# Patient Record
Sex: Male | Born: 1950 | Race: White | Hispanic: No | Marital: Married | Smoking: Former smoker
Health system: Southern US, Community
[De-identification: ages and names within clinical notes are randomized; demographics above are authoritative.]

## PROBLEM LIST (undated history)

## (undated) DIAGNOSIS — J449 Chronic obstructive pulmonary disease, unspecified: Secondary | ICD-10-CM

## (undated) DIAGNOSIS — H9190 Unspecified hearing loss, unspecified ear: Secondary | ICD-10-CM

## (undated) DIAGNOSIS — I509 Heart failure, unspecified: Secondary | ICD-10-CM

## (undated) DIAGNOSIS — R29898 Other symptoms and signs involving the musculoskeletal system: Secondary | ICD-10-CM

## (undated) DIAGNOSIS — R2681 Unsteadiness on feet: Secondary | ICD-10-CM

## (undated) DIAGNOSIS — F0390 Unspecified dementia without behavioral disturbance: Secondary | ICD-10-CM

## (undated) DIAGNOSIS — Z87442 Personal history of urinary calculi: Secondary | ICD-10-CM

## (undated) DIAGNOSIS — E512 Wernicke's encephalopathy: Secondary | ICD-10-CM

## (undated) DIAGNOSIS — I10 Essential (primary) hypertension: Secondary | ICD-10-CM

## (undated) DIAGNOSIS — E119 Type 2 diabetes mellitus without complications: Secondary | ICD-10-CM

## (undated) HISTORY — PX: NOSE SURGERY: SHX723

## (undated) HISTORY — PX: INNER EAR SURGERY: SHX679

## (undated) HISTORY — DX: Type 2 diabetes mellitus without complications: E11.9

## (undated) HISTORY — PX: BACK SURGERY: SHX140

## (undated) HISTORY — DX: Wernicke's encephalopathy: E51.2

## (undated) HISTORY — DX: Heart failure, unspecified: I50.9

## (undated) HISTORY — DX: Unspecified dementia, unspecified severity, without behavioral disturbance, psychotic disturbance, mood disturbance, and anxiety: F03.90

---

## 2004-11-25 ENCOUNTER — Ambulatory Visit (HOSPITAL_COMMUNITY): Admission: RE | Admit: 2004-11-25 | Discharge: 2004-11-25 | Payer: Self-pay | Admitting: Family Medicine

## 2007-06-30 ENCOUNTER — Ambulatory Visit (HOSPITAL_COMMUNITY): Admission: RE | Admit: 2007-06-30 | Discharge: 2007-06-30 | Payer: Self-pay | Admitting: Family Medicine

## 2011-08-13 ENCOUNTER — Emergency Department (HOSPITAL_COMMUNITY)
Admission: EM | Admit: 2011-08-13 | Discharge: 2011-08-13 | Disposition: A | Discharge status: Home / Self Care | Payer: 59 | Attending: Emergency Medicine | Admitting: Emergency Medicine

## 2011-08-13 ENCOUNTER — Encounter: Payer: Self-pay | Admitting: *Deleted

## 2011-08-13 DIAGNOSIS — S0100XA Unspecified open wound of scalp, initial encounter: Secondary | ICD-10-CM | POA: Insufficient documentation

## 2011-08-13 DIAGNOSIS — Z23 Encounter for immunization: Secondary | ICD-10-CM | POA: Insufficient documentation

## 2011-08-13 DIAGNOSIS — IMO0002 Reserved for concepts with insufficient information to code with codable children: Secondary | ICD-10-CM | POA: Insufficient documentation

## 2011-08-13 DIAGNOSIS — Y92009 Unspecified place in unspecified non-institutional (private) residence as the place of occurrence of the external cause: Secondary | ICD-10-CM | POA: Insufficient documentation

## 2011-08-13 DIAGNOSIS — S0990XA Unspecified injury of head, initial encounter: Secondary | ICD-10-CM | POA: Insufficient documentation

## 2011-08-13 DIAGNOSIS — S0101XA Laceration without foreign body of scalp, initial encounter: Secondary | ICD-10-CM

## 2011-08-13 DIAGNOSIS — R51 Headache: Secondary | ICD-10-CM | POA: Insufficient documentation

## 2011-08-13 MED ORDER — NAPROXEN 500 MG PO TABS: 500.0000 mg | ORAL_TABLET | Freq: Two times a day (BID) | ORAL | Status: AC

## 2011-08-13 MED ORDER — TETANUS-DIPHTH-ACELL PERTUSSIS 5-2.5-18.5 LF-MCG/0.5 IM SUSP
INTRAMUSCULAR | Status: AC
  Administered 2011-08-13: 0.5 mL via INTRAMUSCULAR
  Filled 2011-08-13: qty 0.5

## 2011-08-13 MED ORDER — BACITRACIN ZINC 500 UNIT/GM EX OINT
TOPICAL_OINTMENT | CUTANEOUS | Status: AC
  Filled 2011-08-13: qty 0.9

## 2011-08-13 MED ORDER — BACITRACIN 500 UNIT/GM EX OINT
1.0000 "application " | TOPICAL_OINTMENT | Freq: Two times a day (BID) | CUTANEOUS | Status: DC
  Administered 2011-08-13: 1 via TOPICAL
  Filled 2011-08-13 (×3): qty 0.9

## 2011-08-13 NOTE — ED Notes (Signed)
Lac cleaned w/ normal saline & sterile gauze. edp cleaned w/ betadine prior to stapling. Dressing applied to head.

## 2011-08-13 NOTE — ED Notes (Signed)
2" lac to rt posterior scalp.  Cleansed, staples  By Dr Hyacinth Meeker.  Released in care of wife.  Pt is heard of hearing.

## 2011-08-13 NOTE — ED Notes (Signed)
Scalp lac, struck head on wt bench , had fallen asleep  While using computer

## 2011-08-13 NOTE — ED Provider Notes (Signed)
History     CSN: [id number] Arrival date & time: 08/13/2011  2:10 AM   First MD Initiated Contact with Patient 08/13/11 [phone]      Chief Complaint  Patient presents with  . Laceration    (Consider location/radiation/quality/duration/timing/severity/associated sxs/prior treatment) HPI Comments: Acute onset of head injury after he fell asleep at the computer, fell backwards striking his posterior right head on a weight bench. This was associated with a laceration to the head, moderate bleeding which was controlled with pressure. No associated loss of consciousness, seizure, vomiting, visual change, weakness, numbness, ataxia. Symptoms are constant Symptoms are mild Worse with palpation  Patient is a 60 y.o. male presenting with scalp laceration. The history is provided by the patient and the spouse.  Head Laceration This is a new problem. The current episode started less than 1 hour ago. The problem has not changed since onset.Associated symptoms include headaches ( initially had headache, but has resolved). Pertinent negatives include no chest pain, no abdominal pain and no shortness of breath. Exacerbated by: Palpation. Relieved by: Rest.    History reviewed. No pertinent past medical history.  Past Surgical History  Procedure Date  . Back surgery   . Nose surgery     No family history on file.  History  Substance Use Topics  . Smoking status: Current Everyday Smoker  . Smokeless tobacco: Not on file  . Alcohol Use: Yes      Review of Systems  Constitutional: Negative for fever.  Respiratory: Negative for shortness of breath.   Cardiovascular: Negative for chest pain.  Gastrointestinal: Negative for vomiting and abdominal pain.  Skin: Positive for wound.       Laceration  Neurological: Positive for headaches ( initially had headache, but has resolved). Negative for weakness and numbness.    Allergies  Review of patient's allergies indicates no known  allergies.  Home Medications   Current Outpatient Rx  Name Route Sig Dispense Refill  . NAPROXEN 500 MG PO TABS Oral Take 1 tablet (500 mg total) by mouth 2 (two) times daily with a meal. 30 tablet 0    BP 162/100  SpO2 97%  Physical Exam  Nursing note and vitals reviewed. Constitutional: He appears well-developed and well-nourished. No distress.  HENT:  Head: Normocephalic.       5.5 cm laceration to the posterior right occipital scalp.  Minimal surrounding tenderness and no depression of the skull palpated  no facial tenderness, deformity, malocclusion or hemotympanum.  no battle's sign or racoon eyes.   Eyes: Conjunctivae are normal. Right eye exhibits no discharge. Left eye exhibits no discharge. No scleral icterus.  Neck: Normal range of motion. Neck supple.  Cardiovascular: Normal rate and regular rhythm.   No murmur heard. Pulmonary/Chest: Effort normal and breath sounds normal.  Musculoskeletal: He exhibits no edema and no tenderness.       No spinal tenderness to palpation  Skin: Skin is warm and dry. He is not diaphoretic.       Laceration is linear, bleeding controlled with pressure, no foreign body visualized    ED Course  LACERATION REPAIR Date/Time: 08/13/2011 2:10 AM Performed by: Eber Hong D Authorized by: Eber Hong D Consent: Verbal consent obtained. Risks and benefits: risks, benefits and alternatives were discussed Consent given by: patient Patient understanding: patient states understanding of the procedure being performed Patient consent: the patient's understanding of the procedure matches consent given Procedure consent: procedure consent matches procedure scheduled Relevant documents: relevant documents present and verified  Test results: test results available and properly labeled Site marked: the operative site was marked Imaging studies: imaging studies available Patient identity confirmed: verbally with patient and arm band Time out:  Immediately prior to procedure a "time out" was called to verify the correct patient, procedure, equipment, support staff and site/side marked as required. Location: Posterior scalp. Laceration length: 5.5 cm Foreign bodies: no foreign bodies Tendon involvement: none Nerve involvement: none Vascular damage: no Anesthesia method: None. Patient sedated: no Preparation: Patient was prepped and draped in the usual sterile fashion. Irrigation solution: saline Irrigation method: syringe Debridement: none Degree of undermining: none Skin closure: staples Number of sutures: 6 Suture technique: Staples. Approximation: close Approximation difficulty: simple Dressing: pressure dressing and antibiotic ointment Patient tolerance: Patient tolerated the procedure well with no immediate complications.   (including critical care time)  Labs Reviewed - No data to display No results found.   1. Laceration of scalp   2. Minor head injury       MDM  Head injury, no signs of focal neurologic deficit, no anticoagulative therapy, bleeding controlled after staples placed, wound cleansed with saline and Betadine prior to closure, bacitracin and pressure dressing after closure. Patient declines pain medications, given indications for followup and return for staple removal. Understanding expressed by patient and spouse        Vida Roller, MD 08/13/11 [phone]

## 2015-10-24 ENCOUNTER — Emergency Department (HOSPITAL_COMMUNITY): Payer: BLUE CROSS/BLUE SHIELD

## 2015-10-24 ENCOUNTER — Encounter (HOSPITAL_COMMUNITY): Payer: Self-pay | Admitting: Emergency Medicine

## 2015-10-24 ENCOUNTER — Inpatient Hospital Stay (HOSPITAL_COMMUNITY)
Admission: EM | Admit: 2015-10-24 | Discharge: 2015-11-11 | DRG: 190 | Disposition: A | Discharge status: Skilled Nursing Facility | Payer: BLUE CROSS/BLUE SHIELD | Attending: Internal Medicine | Admitting: Internal Medicine

## 2015-10-24 DIAGNOSIS — J9601 Acute respiratory failure with hypoxia: Secondary | ICD-10-CM | POA: Diagnosis present

## 2015-10-24 DIAGNOSIS — R0902 Hypoxemia: Secondary | ICD-10-CM

## 2015-10-24 DIAGNOSIS — I5033 Acute on chronic diastolic (congestive) heart failure: Secondary | ICD-10-CM | POA: Diagnosis not present

## 2015-10-24 DIAGNOSIS — E669 Obesity, unspecified: Secondary | ICD-10-CM | POA: Diagnosis present

## 2015-10-24 DIAGNOSIS — J441 Chronic obstructive pulmonary disease with (acute) exacerbation: Secondary | ICD-10-CM

## 2015-10-24 DIAGNOSIS — J440 Chronic obstructive pulmonary disease with acute lower respiratory infection: Secondary | ICD-10-CM | POA: Diagnosis not present

## 2015-10-24 DIAGNOSIS — Z6829 Body mass index (BMI) 29.0-29.9, adult: Secondary | ICD-10-CM

## 2015-10-24 DIAGNOSIS — I959 Hypotension, unspecified: Secondary | ICD-10-CM | POA: Diagnosis present

## 2015-10-24 DIAGNOSIS — I110 Hypertensive heart disease with heart failure: Secondary | ICD-10-CM | POA: Diagnosis present

## 2015-10-24 DIAGNOSIS — R451 Restlessness and agitation: Secondary | ICD-10-CM | POA: Insufficient documentation

## 2015-10-24 DIAGNOSIS — F10239 Alcohol dependence with withdrawal, unspecified: Secondary | ICD-10-CM | POA: Diagnosis present

## 2015-10-24 DIAGNOSIS — I160 Hypertensive urgency: Secondary | ICD-10-CM | POA: Diagnosis present

## 2015-10-24 DIAGNOSIS — R1310 Dysphagia, unspecified: Secondary | ICD-10-CM | POA: Diagnosis present

## 2015-10-24 DIAGNOSIS — R0602 Shortness of breath: Secondary | ICD-10-CM | POA: Diagnosis present

## 2015-10-24 DIAGNOSIS — T17908A Unspecified foreign body in respiratory tract, part unspecified causing other injury, initial encounter: Secondary | ICD-10-CM

## 2015-10-24 DIAGNOSIS — I5032 Chronic diastolic (congestive) heart failure: Secondary | ICD-10-CM | POA: Diagnosis not present

## 2015-10-24 DIAGNOSIS — T17998D Other foreign object in respiratory tract, part unspecified causing other injury, subsequent encounter: Secondary | ICD-10-CM | POA: Diagnosis not present

## 2015-10-24 DIAGNOSIS — F10939 Alcohol use, unspecified with withdrawal, unspecified: Secondary | ICD-10-CM

## 2015-10-24 DIAGNOSIS — G9340 Encephalopathy, unspecified: Secondary | ICD-10-CM | POA: Diagnosis present

## 2015-10-24 DIAGNOSIS — F17200 Nicotine dependence, unspecified, uncomplicated: Secondary | ICD-10-CM | POA: Diagnosis present

## 2015-10-24 DIAGNOSIS — J449 Chronic obstructive pulmonary disease, unspecified: Secondary | ICD-10-CM

## 2015-10-24 DIAGNOSIS — J069 Acute upper respiratory infection, unspecified: Secondary | ICD-10-CM | POA: Diagnosis not present

## 2015-10-24 DIAGNOSIS — J9801 Acute bronchospasm: Secondary | ICD-10-CM | POA: Diagnosis not present

## 2015-10-24 DIAGNOSIS — J988 Other specified respiratory disorders: Secondary | ICD-10-CM

## 2015-10-24 DIAGNOSIS — J690 Pneumonitis due to inhalation of food and vomit: Secondary | ICD-10-CM | POA: Diagnosis not present

## 2015-10-24 DIAGNOSIS — I10 Essential (primary) hypertension: Secondary | ICD-10-CM

## 2015-10-24 DIAGNOSIS — I509 Heart failure, unspecified: Secondary | ICD-10-CM

## 2015-10-24 DIAGNOSIS — K219 Gastro-esophageal reflux disease without esophagitis: Secondary | ICD-10-CM | POA: Diagnosis present

## 2015-10-24 DIAGNOSIS — F10231 Alcohol dependence with withdrawal delirium: Secondary | ICD-10-CM | POA: Diagnosis not present

## 2015-10-24 DIAGNOSIS — F10232 Alcohol dependence with withdrawal with perceptual disturbance: Secondary | ICD-10-CM | POA: Diagnosis not present

## 2015-10-24 DIAGNOSIS — R079 Chest pain, unspecified: Secondary | ICD-10-CM | POA: Diagnosis not present

## 2015-10-24 HISTORY — DX: Essential (primary) hypertension: I10

## 2015-10-24 HISTORY — DX: Chronic obstructive pulmonary disease, unspecified: J44.9

## 2015-10-24 LAB — CBC
HCT: 51.7 % (ref 39.0–52.0)
Hemoglobin: 17.5 g/dL — ABNORMAL HIGH (ref 13.0–17.0)
MCH: 34.8 pg — ABNORMAL HIGH (ref 26.0–34.0)
MCHC: 33.8 g/dL (ref 30.0–36.0)
MCV: 102.8 fL — ABNORMAL HIGH (ref 78.0–100.0)
Platelets: 133 10*3/uL — ABNORMAL LOW (ref 150–400)
RBC: 5.03 MIL/uL (ref 4.22–5.81)
RDW: 13 % (ref 11.5–15.5)
WBC: 8.4 10*3/uL (ref 4.0–10.5)

## 2015-10-24 LAB — BASIC METABOLIC PANEL
ANION GAP: 10 (ref 5–15)
BUN: 10 mg/dL (ref 6–20)
CALCIUM: 9.6 mg/dL (ref 8.9–10.3)
CO2: 31 mmol/L (ref 22–32)
Chloride: 97 mmol/L — ABNORMAL LOW (ref 101–111)
Creatinine, Ser: 1.07 mg/dL (ref 0.61–1.24)
GFR calc Af Amer: >60 mL/min (ref >60)
GLUCOSE: 134 mg/dL — AB (ref 65–99)
POTASSIUM: 4.2 mmol/L (ref 3.5–5.1)
SODIUM: 138 mmol/L (ref 135–145)

## 2015-10-24 LAB — URINALYSIS, ROUTINE W REFLEX MICROSCOPIC
Bilirubin Urine: NEGATIVE
Glucose, UA: NEGATIVE mg/dL
Hgb urine dipstick: NEGATIVE
Ketones, ur: NEGATIVE mg/dL
Leukocytes, UA: NEGATIVE
Nitrite: NEGATIVE
Protein, ur: 30 mg/dL — AB
Specific Gravity, Urine: 1.015 (ref 1.005–1.030)
pH: 6.5 (ref 5.0–8.0)

## 2015-10-24 LAB — MRSA PCR SCREENING: MRSA BY PCR: NEGATIVE

## 2015-10-24 LAB — URINE MICROSCOPIC-ADD ON
Bacteria, UA: NONE SEEN
Squamous Epithelial / LPF: NONE SEEN
WBC, UA: NONE SEEN WBC/hpf (ref 0–5)

## 2015-10-24 LAB — TSH: TSH: 0.954 u[IU]/mL (ref 0.350–4.500)

## 2015-10-24 LAB — BRAIN NATRIURETIC PEPTIDE: B NATRIURETIC PEPTIDE 5: 61 pg/mL (ref 0.0–100.0)

## 2015-10-24 LAB — TROPONIN I: TROPONIN I: 0.03 ng/mL (ref <0.031)

## 2015-10-24 MED ORDER — ALBUTEROL SULFATE (2.5 MG/3ML) 0.083% IN NEBU: 2.5000 mg | INHALATION_SOLUTION | Freq: Four times a day (QID) | RESPIRATORY_TRACT | Status: DC

## 2015-10-24 MED ORDER — ACETAMINOPHEN 650 MG RE SUPP
650.0000 mg | Freq: Four times a day (QID) | RECTAL | Status: DC | PRN
  Filled 2015-10-24: qty 1

## 2015-10-24 MED ORDER — ENOXAPARIN SODIUM 40 MG/0.4ML ~~LOC~~ SOLN
40.0000 mg | SUBCUTANEOUS | Status: DC
  Administered 2015-10-24 – 2015-10-31 (×8): 40 mg via SUBCUTANEOUS
  Filled 2015-10-24 (×7): qty 0.4

## 2015-10-24 MED ORDER — ALBUTEROL (5 MG/ML) CONTINUOUS INHALATION SOLN
10.0000 mg/h | INHALATION_SOLUTION | RESPIRATORY_TRACT | Status: DC
  Administered 2015-10-24: 10 mg/h via RESPIRATORY_TRACT
  Filled 2015-10-24: qty 20

## 2015-10-24 MED ORDER — IPRATROPIUM-ALBUTEROL 0.5-2.5 (3) MG/3ML IN SOLN
3.0000 mL | Freq: Four times a day (QID) | RESPIRATORY_TRACT | Status: DC
  Administered 2015-10-24 – 2015-10-25 (×2): 3 mL via RESPIRATORY_TRACT
  Filled 2015-10-24 (×2): qty 3

## 2015-10-24 MED ORDER — SODIUM CHLORIDE 0.9% FLUSH: 3.0000 mL | INTRAVENOUS | Status: DC | PRN

## 2015-10-24 MED ORDER — IPRATROPIUM-ALBUTEROL 0.5-2.5 (3) MG/3ML IN SOLN
3.0000 mL | Freq: Four times a day (QID) | RESPIRATORY_TRACT | Status: DC
  Administered 2015-10-24: 3 mL via RESPIRATORY_TRACT
  Filled 2015-10-24 (×2): qty 3

## 2015-10-24 MED ORDER — ALBUTEROL SULFATE (2.5 MG/3ML) 0.083% IN NEBU: 2.5000 mg | INHALATION_SOLUTION | RESPIRATORY_TRACT | Status: DC | PRN

## 2015-10-24 MED ORDER — LORAZEPAM 1 MG PO TABS: 1.0000 mg | ORAL_TABLET | Freq: Four times a day (QID) | ORAL | Status: AC | PRN

## 2015-10-24 MED ORDER — LORAZEPAM 2 MG/ML IJ SOLN
0.0000 mg | Freq: Two times a day (BID) | INTRAMUSCULAR | Status: DC
  Filled 2015-10-24: qty 2

## 2015-10-24 MED ORDER — SODIUM CHLORIDE 0.9% FLUSH
3.0000 mL | Freq: Two times a day (BID) | INTRAVENOUS | Status: DC
  Administered 2015-10-24 – 2015-11-09 (×21): 3 mL via INTRAVENOUS

## 2015-10-24 MED ORDER — LORAZEPAM 2 MG/ML IJ SOLN
0.0000 mg | Freq: Four times a day (QID) | INTRAMUSCULAR | Status: DC
  Administered 2015-10-24 – 2015-10-25 (×3): 2 mg via INTRAVENOUS
  Administered 2015-10-25 – 2015-10-26 (×3): 4 mg via INTRAVENOUS
  Filled 2015-10-24: qty 2
  Filled 2015-10-24 (×2): qty 1
  Filled 2015-10-24: qty 2
  Filled 2015-10-24: qty 1
  Filled 2015-10-24 (×2): qty 2

## 2015-10-24 MED ORDER — FOLIC ACID 1 MG PO TABS
1.0000 mg | ORAL_TABLET | Freq: Every day | ORAL | Status: DC
  Administered 2015-10-24 – 2015-10-29 (×4): 1 mg via ORAL
  Filled 2015-10-24 (×4): qty 1

## 2015-10-24 MED ORDER — IPRATROPIUM BROMIDE 0.02 % IN SOLN: 0.5000 mg | Freq: Four times a day (QID) | RESPIRATORY_TRACT | Status: DC

## 2015-10-24 MED ORDER — ALBUTEROL SULFATE (2.5 MG/3ML) 0.083% IN NEBU
2.5000 mg | INHALATION_SOLUTION | RESPIRATORY_TRACT | Status: DC
  Administered 2015-10-24: 2.5 mg via RESPIRATORY_TRACT
  Filled 2015-10-24: qty 3

## 2015-10-24 MED ORDER — DEXTROSE 5 % IV SOLN
0.5000 mg/min | INTRAVENOUS | Status: DC
  Administered 2015-10-24: 0.5 mg/min via INTRAVENOUS
  Filled 2015-10-24: qty 100

## 2015-10-24 MED ORDER — POTASSIUM CHLORIDE CRYS ER 20 MEQ PO TBCR
20.0000 meq | EXTENDED_RELEASE_TABLET | Freq: Once | ORAL | Status: AC
  Administered 2015-10-24: 20 meq via ORAL
  Filled 2015-10-24: qty 1

## 2015-10-24 MED ORDER — METHYLPREDNISOLONE SODIUM SUCC 125 MG IJ SOLR
80.0000 mg | Freq: Once | INTRAMUSCULAR | Status: AC
  Administered 2015-10-24: 80 mg via INTRAVENOUS
  Filled 2015-10-24: qty 2

## 2015-10-24 MED ORDER — FUROSEMIDE 10 MG/ML IJ SOLN
60.0000 mg | Freq: Once | INTRAMUSCULAR | Status: AC
  Administered 2015-10-24: 60 mg via INTRAVENOUS
  Filled 2015-10-24: qty 6

## 2015-10-24 MED ORDER — VITAMIN B-1 100 MG PO TABS
100.0000 mg | ORAL_TABLET | Freq: Every day | ORAL | Status: DC
  Administered 2015-10-24 – 2015-11-11 (×9): 100 mg via ORAL
  Filled 2015-10-24 (×13): qty 1

## 2015-10-24 MED ORDER — ONDANSETRON HCL 4 MG PO TABS: 4.0000 mg | ORAL_TABLET | Freq: Four times a day (QID) | ORAL | Status: DC | PRN

## 2015-10-24 MED ORDER — GUAIFENESIN 100 MG/5ML PO SOLN
5.0000 mL | ORAL | Status: DC | PRN
  Administered 2015-10-24 – 2015-10-26 (×3): 100 mg via ORAL
  Filled 2015-10-24 (×3): qty 5

## 2015-10-24 MED ORDER — SENNOSIDES-DOCUSATE SODIUM 8.6-50 MG PO TABS: 1.0000 | ORAL_TABLET | Freq: Every evening | ORAL | Status: DC | PRN

## 2015-10-24 MED ORDER — ACETAMINOPHEN 325 MG PO TABS
650.0000 mg | ORAL_TABLET | Freq: Four times a day (QID) | ORAL | Status: DC | PRN
  Administered 2015-11-03: 650 mg via ORAL

## 2015-10-24 MED ORDER — ONDANSETRON HCL 4 MG/2ML IJ SOLN
4.0000 mg | Freq: Three times a day (TID) | INTRAMUSCULAR | Status: DC | PRN
Start: 2015-10-24 — End: 2015-10-24

## 2015-10-24 MED ORDER — ONDANSETRON HCL 4 MG/2ML IJ SOLN: 4.0000 mg | Freq: Four times a day (QID) | INTRAMUSCULAR | Status: DC | PRN

## 2015-10-24 MED ORDER — LORAZEPAM 2 MG/ML IJ SOLN
1.0000 mg | Freq: Four times a day (QID) | INTRAMUSCULAR | Status: AC | PRN
  Administered 2015-10-26 – 2015-10-27 (×2): 1 mg via INTRAVENOUS
  Filled 2015-10-24 (×3): qty 1

## 2015-10-24 MED ORDER — LEVOFLOXACIN IN D5W 750 MG/150ML IV SOLN
750.0000 mg | INTRAVENOUS | Status: DC
  Administered 2015-10-24 – 2015-10-28 (×5): 750 mg via INTRAVENOUS
  Filled 2015-10-24 (×5): qty 150

## 2015-10-24 MED ORDER — DM-GUAIFENESIN ER 30-600 MG PO TB12
1.0000 | ORAL_TABLET | Freq: Two times a day (BID) | ORAL | Status: DC
  Administered 2015-10-24 – 2015-11-11 (×19): 1 via ORAL
  Filled 2015-10-24 (×20): qty 1

## 2015-10-24 MED ORDER — THIAMINE HCL 100 MG/ML IJ SOLN
100.0000 mg | Freq: Every day | INTRAMUSCULAR | Status: DC
  Administered 2015-10-27 – 2015-11-08 (×6): 100 mg via INTRAVENOUS
  Filled 2015-10-24 (×7): qty 2

## 2015-10-24 MED ORDER — ADULT MULTIVITAMIN W/MINERALS CH
1.0000 | ORAL_TABLET | Freq: Every day | ORAL | Status: DC
  Administered 2015-10-24 – 2015-11-11 (×10): 1 via ORAL
  Filled 2015-10-24 (×13): qty 1

## 2015-10-24 MED ORDER — SODIUM CHLORIDE 0.9 % IV SOLN
250.0000 mL | INTRAVENOUS | Status: DC | PRN
  Administered 2015-10-27: 250 mL via INTRAVENOUS

## 2015-10-24 MED ORDER — METHYLPREDNISOLONE SODIUM SUCC 125 MG IJ SOLR
60.0000 mg | Freq: Two times a day (BID) | INTRAMUSCULAR | Status: DC
  Administered 2015-10-24 – 2015-10-25 (×2): 60 mg via INTRAVENOUS
  Filled 2015-10-24 (×2): qty 2

## 2015-10-24 NOTE — ED Notes (Signed)
Patient complaining of shortness of breath "for months but worsening recently." States patient was seen by Urgent Care yesterday and told he "has some congestive heart failure and I need to go to the hospital." Spouse states patient did not come yesterday due to "no insurance."

## 2015-10-24 NOTE — ED Notes (Signed)
Report given to Clarise Cruz, RN for room ICU-06.

## 2015-10-24 NOTE — H&P (Addendum)
[facility]          History and Physical    PCP:   No PCP Per Patient   EDP: Virgel Manifold, M.D.  Chief Complaint:  Shortness of breath, lower extremity edema  HPI: Patient is a 65 year old man who has not had any medical care in the past 20 years who presents to the hospital today with a 3 month history of worsening shortness of breath or lower extremity edema. He visited an urgent care yesterday and was told he probably had pneumonia and advised him to come to the emergency department today. Denies any fevers or chills, states that the shortness of breath is worse with exertion, he smokes a pack and half a day and has done so for over 20 years, states he drinks about 6 beers a day, for the past 7 days he has had a runny nose and congestion as well as a nonproductive cough. In the emergency department he was found to be markedly hypertensive with an initial blood pressure of 205/134. Labs were essentially unremarkable with the exception of slight hemoconcentration with a hemoglobin of 17.5 chest x-ray showed mild chronic kidney changes but no acute findings. Initial troponin was normal at 0.03 and EKG was normal with the exception of sinus tachycardia with a rate of 111. Avonex for further evaluation and management.  Allergies:  No Known Allergies    Past Medical History  Diagnosis Date  . COPD (chronic obstructive pulmonary disease) ([place])   . Hypertension     Past Surgical History  Procedure Laterality Date  . Back surgery    . Nose surgery    . Inner ear surgery      Prior to Admission medications   Medication Sig Start Date End Date Taking? Authorizing Provider  acetaminophen (TYLENOL) 500 MG tablet Take 500 mg by mouth every 6 (six) hours as needed for mild pain or moderate pain.   Yes Historical Provider, MD  azithromycin (ZITHROMAX) 250 MG tablet Take 250-500 mg by mouth See admin instructions. Take two tablets by mouth on day 1 then take one  tablet on days 2 through 5 starting on 10/23/2015   Yes Historical Provider, MD  dextromethorphan-guaiFENesin (MUCINEX DM) 30-600 MG 12hr tablet Take 1 tablet by mouth 2 (two) times daily as needed for cough.   Yes Historical Provider, MD  guaiFENesin-dextromethorphan (ROBITUSSIN DM) 100-10 MG/5ML syrup Take 10 mLs by mouth every 4 (four) hours as needed for cough.   Yes Historical Provider, MD  oxymetazoline (AFRIN) 0.05 % nasal spray Place 2 sprays into both nostrils 2 (two) times daily as needed for congestion.   Yes Historical Provider, MD  Phenyleph-CPM-DM-Aspirin (ALKA-SELTZER PLUS COLD & COUGH) 7.04-14-09-325 MG TBEF Take 1 tablet by mouth daily as needed (for cold and congestion).   Yes Historical Provider, MD  predniSONE (DELTASONE) 20 MG tablet Take 20 mg by mouth See admin instructions. Starting on 10/23/15 take 3 tablets daily for 4 days, then take 1 tablet daily for 3 days   Yes Historical Provider, MD    Social History:  reports that he has been smoking.  He does not have any smokeless tobacco history on file. He reports that he drinks alcohol. He reports that he does not use illicit drugs.  History reviewed. No pertinent family history.  Review of Systems:  Constitutional: Denies fever, chills, diaphoresis, appetite change and fatigue.  HEENT: Denies photophobia, eye pain,  redness, hearing loss, ear pain, congestion, sore throat, rhinorrhea, sneezing, mouth sores, trouble swallowing, neck pain, neck stiffness and tinnitus.   Respiratory: Denies SOB, DOE, cough, chest tightness,  and wheezing.   Cardiovascular: Denies chest pain, palpitations and leg swelling.  Gastrointestinal: Denies nausea, vomiting, abdominal pain, diarrhea, constipation, blood in stool and abdominal distention.  Genitourinary: Denies dysuria, urgency, frequency, hematuria, flank pain and difficulty urinating.  Endocrine: Denies: hot or cold intolerance, sweats, changes in hair or nails, polyuria,  polydipsia. Musculoskeletal: Denies myalgias, back pain, joint swelling, arthralgias and gait problem.  Skin: Denies pallor, rash and wound.  Neurological: Denies dizziness, seizures, syncope, weakness, light-headedness, numbness and headaches.  Hematological: Denies adenopathy. Easy bruising, personal or family bleeding history  Psychiatric/Behavioral: Denies suicidal ideation, mood changes, confusion, nervousness, sleep disturbance and agitation   Physical Exam: Blood pressure 155/92, pulse 97, temperature 98 F (36.7 C), temperature source Oral, resp. rate 14, height 5' 9"  (1.753 m), weight 97.9 kg (215 lb 13.3 oz), SpO2 94 %. General: Alert, awake, oriented 3 HEENT: Normocephalic, atraumatic, pupils equal and reactive to light, extraocular movements intact, wears corrective lenses + neck: Supple, no JVD, lymphadenopathy, no bruits, no goiter. Cardiovascular: Regular rate and rhythm, no murmurs, rubs or gallops a slightly  Lungs: Diffuse bilateral expiratory wheezes, no rhonchi or crackles Abdomen: Obese, soft, nontender, nondistended, positive bowel sounds Extremities: 2+ pitting edema bilaterally, positive pulses Neurologic: Grossly intact and nonfocal  Labs on Admission:  Results for orders placed or performed during the hospital encounter of 10/24/15 (from the past 48 hour(s))  Basic metabolic panel     Status: Abnormal   Collection Time: 10/24/15  9:07 AM  Result Value Ref Range   Sodium 138 135 - 145 mmol/L   Potassium 4.2 3.5 - 5.1 mmol/L   Chloride 97 (L) 101 - 111 mmol/L   CO2 31 22 - 32 mmol/L   Glucose, Bld 134 (H) 65 - 99 mg/dL   BUN 10 6 - 20 mg/dL   Creatinine, Ser 1.07 0.61 - 1.24 mg/dL   Calcium 9.6 8.9 - 10.3 mg/dL   GFR calc non Af Amer >60 >60 mL/min   GFR calc Af Amer >60 >60 mL/min    Comment: (NOTE) The eGFR has been calculated using the CKD EPI equation. This calculation has not been validated in all clinical situations. eGFR's persistently <60 mL/min  signify possible Chronic Kidney Disease.    Anion gap 10 5 - 15  CBC     Status: Abnormal   Collection Time: 10/24/15  9:07 AM  Result Value Ref Range   WBC 8.4 4.0 - 10.5 K/uL   RBC 5.03 4.22 - 5.81 MIL/uL   Hemoglobin 17.5 (H) 13.0 - 17.0 g/dL   HCT 51.7 39.0 - 52.0 %   MCV 102.8 (H) 78.0 - 100.0 fL   MCH 34.8 (H) 26.0 - 34.0 pg   MCHC 33.8 30.0 - 36.0 g/dL   RDW 13.0 11.5 - 15.5 %   Platelets 133 (L) 150 - 400 K/uL  Brain natriuretic peptide     Status: None   Collection Time: 10/24/15  9:07 AM  Result Value Ref Range   B Natriuretic Peptide 61.0 0.0 - 100.0 pg/mL  Troponin I     Status: None   Collection Time: 10/24/15  9:07 AM  Result Value Ref Range   Troponin I 0.03 <0.031 ng/mL    Comment:        NO INDICATION OF MYOCARDIAL INJURY.   TSH  Status: None   Collection Time: 10/24/15  9:07 AM  Result Value Ref Range   TSH 0.954 0.350 - 4.500 uIU/mL  Urinalysis, Routine w reflex microscopic (not at [facility])     Status: Abnormal   Collection Time: 10/24/15 11:34 AM  Result Value Ref Range   Color, Urine YELLOW YELLOW   APPearance CLEAR CLEAR   Specific Gravity, Urine 1.015 1.005 - 1.030   pH 6.5 5.0 - 8.0   Glucose, UA NEGATIVE NEGATIVE mg/dL   Hgb urine dipstick NEGATIVE NEGATIVE   Bilirubin Urine NEGATIVE NEGATIVE   Ketones, ur NEGATIVE NEGATIVE mg/dL   Protein, ur 30 (A) NEGATIVE mg/dL   Nitrite NEGATIVE NEGATIVE   Leukocytes, UA NEGATIVE NEGATIVE  Urine microscopic-add on     Status: None   Collection Time: 10/24/15 11:34 AM  Result Value Ref Range   Squamous Epithelial / LPF NONE SEEN NONE SEEN   WBC, UA NONE SEEN 0 - 5 WBC/hpf   RBC / HPF 0-5 0 - 5 RBC/hpf   Bacteria, UA NONE SEEN NONE SEEN    Radiological Exams on Admission: Dg Chest 2 View  10/24/2015  CLINICAL DATA:  Productive cough for several months with some shortness of breath, patient smokes EXAM: CHEST  2 VIEW COMPARISON:  11/25/2004 FINDINGS: Stable mild cardiac enlargement. Vascular pattern  normal. No consolidation or effusion. Stable mild bronchitic change. IMPRESSION: Mild chronic bronchitic change.  No acute findings. Electronically Signed   By: Skipper Cliche M.D.   On: 10/24/2015 09:38    Assessment/Plan Principal Problem:   COPD exacerbation (HCC) Active Problems:   Bronchospasm   Hypoxemia   URI (upper respiratory infection)   Hypertensive urgency    Acute hypoxemic respiratory failure -Likely due to COPD with acute exacerbation. -See below for details. -oxygen as tolerated.   COPD with acute exacerbation -He has not been formally diagnosed with COPD, however given his bronchospasm and long-standing history of smoking or believe this is a likely diagnosis. -Would benefit from PFTs once acute episode resolved for formal diagnosis. -For now continue nebs, steroids, antibiotic.  Hypertensive urgency -Initial blood pressure 250/130. -ED started on labetalol drip, however by the time I see him in the stepdown unit his blood pressure is already 90/70. -Labetalol drip has been discontinued, we'll not add further hypertensive agents tonight given rapid decline of blood pressure. -If blood pressure starts to creep up in the morning, can start adding antihypertensive agents. -We'll request a 2-D echo as I suspect he may have some diastolic heart failure.  ETOH Abuse -Thiamine/Folate. -Monitor for withdrawals on CIWA protocol.  DVT prophylaxis -Lovenox  CODE STATUS -Full code as discussed with patient and wife Arbie Cookey at bedside.      Time Spent on Admission: 75 minutes  HERNANDEZ ACOSTA,ESTELA [facility] Pager: [phone] 10/24/2015, 6:28 PM

## 2015-10-24 NOTE — ED Provider Notes (Signed)
CSN: [id number]     Arrival date & time 10/24/15  [id number] History  By signing my name below, I, Terressa Koyanagi, attest that this documentation has been prepared under the direction and in the presence of Virgel Manifold, MD. Electronically Signed: Terressa Koyanagi, ED Scribe. 10/24/2015. 10:00 AM.  Chief Complaint  Patient presents with  . Shortness of Breath   The history is provided by the patient and the spouse. No language interpreter was used.   PCP: No PCP Per Patient HPI Comments: Jeff Miles is a 65 y.o. male, accompanied by his wife, with PMHx noted below including daily tobacco use for 30 years (pt notes he quit yesterday), who presents to the Emergency Department complaining of chronic SOB originally onset several months ago and worsening recently. Pt reports due to the severity of his SOB he has been sleeping in a sitting position for the past few months. Associated Sx include swelling of BLE onset 3-4 months ago; chronic cough; intermittent dizziness; chronic, intermittent diaphoresis; wheezing. Pt's wife reports pt was seen at an urgent care yesterday for the same Sx where he was informed he may have congestive heart failure and COPD and was told to f/u with the ED. During exam pt's O2 is 92% in RA. During exam pt's BP is 215/125.   Past Medical History  Diagnosis Date  . COPD (chronic obstructive pulmonary disease) ([place])   . Hypertension    Past Surgical History  Procedure Laterality Date  . Back surgery    . Nose surgery    . Inner ear surgery     History reviewed. No pertinent family history. Social History  Substance Use Topics  . Smoking status: Current Every Day Smoker  . Smokeless tobacco: None  . Alcohol Use: Yes     Comment: "6 beers a day"    Review of Systems  Constitutional: Positive for diaphoresis. Negative for fever.  Respiratory: Positive for cough, shortness of breath and wheezing.   Cardiovascular: Positive for leg swelling.  Neurological: Positive  for dizziness.  Psychiatric/Behavioral: Positive for sleep disturbance (secondary to SOB).  All other systems reviewed and are negative.     Allergies  Review of patient's allergies indicates no known allergies.  Home Medications   Prior to Admission medications   Not on File   Triage Vitals: BP 189/108 mmHg  Pulse 110  Temp(Src) 97.6 F (36.4 C) (Oral)  Resp 24  Ht 5\' 9"  (1.753 m)  Wt 200 lb (90.719 kg)  BMI 29.52 kg/m2  SpO2 95% Physical Exam  Constitutional: He is oriented to person, place, and time. He appears well-developed and well-nourished.  HENT:  Head: Normocephalic and atraumatic.  Eyes: EOM are normal.  Neck: Normal range of motion.  Cardiovascular: Regular rhythm, normal heart sounds and intact distal pulses.  Tachycardia present.   Symmetric BLE edema.   Pulmonary/Chest: Effort normal. Tachypnea (mild) noted. No respiratory distress. He has wheezes (bilateral diffuse expiratory wheezing).  Abdominal: Soft. He exhibits no distension. There is no tenderness.  Musculoskeletal: Normal range of motion.  Neurological: He is alert and oriented to person, place, and time.  Skin: Skin is warm and dry.  Psychiatric: He has a normal mood and affect. Judgment normal.  Nursing note and vitals reviewed.   ED Course  Procedures (including critical care time) DIAGNOSTIC STUDIES: Oxygen Saturation is 95% on ra, adequate by my interpretation.    COORDINATION OF CARE: 9:51 AM: Discussed treatment plan which includes imaging, labs, EKG, hospital admittance with  pt at bedside; patient verbalizes understanding and agrees with treatment plan.  Labs Review Labs Reviewed  BASIC METABOLIC PANEL - Abnormal; Notable for the following:    Chloride 97 (*)    Glucose, Bld 134 (*)    All other components within normal limits  CBC - Abnormal; Notable for the following:    Hemoglobin 17.5 (*)    MCV 102.8 (*)    MCH 34.8 (*)    Platelets 133 (*)    All other components within  normal limits  URINALYSIS, ROUTINE W REFLEX MICROSCOPIC (NOT AT [facility]) - Abnormal; Notable for the following:    Protein, ur 30 (*)    All other components within normal limits  COMPREHENSIVE METABOLIC PANEL - Abnormal; Notable for the following:    Chloride 97 (*)    Glucose, Bld 161 (*)    All other components within normal limits  CBC - Abnormal; Notable for the following:    MCV 102.7 (*)    Platelets 146 (*)    All other components within normal limits  BASIC METABOLIC PANEL - Abnormal; Notable for the following:    Chloride 98 (*)    Glucose, Bld 115 (*)    All other components within normal limits  CBC - Abnormal; Notable for the following:    MCV 105.2 (*)    Platelets 114 (*)    All other components within normal limits  BASIC METABOLIC PANEL - Abnormal; Notable for the following:    Chloride 99 (*)    CO2 34 (*)    Glucose, Bld 139 (*)    All other components within normal limits  CBC - Abnormal; Notable for the following:    MCV 105.4 (*)    Platelets 129 (*)    All other components within normal limits  URINALYSIS, ROUTINE W REFLEX MICROSCOPIC (NOT AT [facility]) - Abnormal; Notable for the following:    Protein, ur 30 (*)    All other components within normal limits  URINE MICROSCOPIC-ADD ON - Abnormal; Notable for the following:    Squamous Epithelial / LPF 0-5 (*)    Bacteria, UA RARE (*)    All other components within normal limits  CBC - Abnormal; Notable for the following:    Hemoglobin 17.7 (*)    HCT 53.6 (*)    MCV 103.3 (*)    MCH 34.1 (*)    Platelets 120 (*)    All other components within normal limits  MRSA PCR SCREENING  CULTURE, EXPECTORATED SPUTUM-ASSESSMENT  BRAIN NATRIURETIC PEPTIDE  TROPONIN I  TSH  URINE MICROSCOPIC-ADD ON  BASIC METABOLIC PANEL    Imaging Review Dg Chest 2 View  10/24/2015  CLINICAL DATA:  Productive cough for several months with some shortness of breath, patient smokes EXAM: CHEST  2 VIEW COMPARISON:  11/25/2004  FINDINGS: Stable mild cardiac enlargement. Vascular pattern normal. No consolidation or effusion. Stable mild bronchitic change. IMPRESSION: Mild chronic bronchitic change.  No acute findings. Electronically Signed   By: Skipper Cliche M.D.   On: 10/24/2015 09:38   I have personally reviewed and evaluated these images and lab results as part of my medical decision-making.   EKG Interpretation   Date/Time:  Friday October 24 2015 09:03:41 EST Ventricular Rate:  111 PR Interval:  158 QRS Duration: 90 QT Interval:  336 QTC Calculation: 457 R Axis:   89 Text Interpretation:  Sinus tachycardia Probable left atrial enlargement  Borderline right axis deviation Baseline wander in lead(s) III No old  tracing to compare Confirmed by Aquinnah Devin  MD, [place] ([id number]) on 10/24/2015  11:42:15 AM      MDM   Final diagnoses:  Acute bronchospasm  Severe hypertension    64yM with no routine medical care with dyspnea. Wheezing on exam. Long smoking history. Nebs and steroids. Has oxygen requirement. Will admit.  I personally preformed the services scribed in my presence. The recorded information has been reviewed is accurate. Virgel Manifold, MD.    Virgel Manifold, MD 10/28/15 [phone]

## 2015-10-24 NOTE — ED Notes (Signed)
MD at bedside. 

## 2015-10-24 NOTE — Progress Notes (Addendum)
Patient arrived from ED. He is alert and oriented and breathing comfortably on RA. Patient is coughing frequently but denies any pain or discomfort. Vss: BP 135/89 mmHg  Pulse 88  Temp(Src) 98 F (36.7 C) (Oral)  Resp 30  Ht 5\' 9"  (1.753 m)  Wt 97.9 kg (215 lb 13.3 oz)  BMI 31.86 kg/m2  SpO2 94%. MD paged about patient's arrival.

## 2015-10-24 NOTE — ED Notes (Signed)
Respiratory paged for neb

## 2015-10-25 ENCOUNTER — Inpatient Hospital Stay (HOSPITAL_COMMUNITY): Payer: BLUE CROSS/BLUE SHIELD

## 2015-10-25 DIAGNOSIS — F10239 Alcohol dependence with withdrawal, unspecified: Secondary | ICD-10-CM

## 2015-10-25 DIAGNOSIS — F10939 Alcohol use, unspecified with withdrawal, unspecified: Secondary | ICD-10-CM

## 2015-10-25 DIAGNOSIS — R079 Chest pain, unspecified: Secondary | ICD-10-CM

## 2015-10-25 LAB — COMPREHENSIVE METABOLIC PANEL
ALT: 50 U/L (ref 17–63)
AST: 40 U/L (ref 15–41)
Albumin: 3.7 g/dL (ref 3.5–5.0)
Alkaline Phosphatase: 70 U/L (ref 38–126)
Anion gap: 11 (ref 5–15)
BUN: 18 mg/dL (ref 6–20)
CHLORIDE: 97 mmol/L — AB (ref 101–111)
CO2: 28 mmol/L (ref 22–32)
CREATININE: 1.14 mg/dL (ref 0.61–1.24)
Calcium: 9.3 mg/dL (ref 8.9–10.3)
Glucose, Bld: 161 mg/dL — ABNORMAL HIGH (ref 65–99)
POTASSIUM: 4.2 mmol/L (ref 3.5–5.1)
Sodium: 136 mmol/L (ref 135–145)
TOTAL PROTEIN: 6.7 g/dL (ref 6.5–8.1)
Total Bilirubin: 0.4 mg/dL (ref 0.3–1.2)

## 2015-10-25 LAB — CBC
HCT: 49.2 % (ref 39.0–52.0)
Hemoglobin: 16.3 g/dL (ref 13.0–17.0)
MCH: 34 pg (ref 26.0–34.0)
MCHC: 33.1 g/dL (ref 30.0–36.0)
MCV: 102.7 fL — AB (ref 78.0–100.0)
PLATELETS: 146 10*3/uL — AB (ref 150–400)
RBC: 4.79 MIL/uL (ref 4.22–5.81)
RDW: 12.9 % (ref 11.5–15.5)
WBC: 7.3 10*3/uL (ref 4.0–10.5)

## 2015-10-25 MED ORDER — METHYLPREDNISOLONE SODIUM SUCC 125 MG IJ SOLR
60.0000 mg | INTRAMUSCULAR | Status: DC
Start: 2015-10-26 — End: 2015-10-27
  Administered 2015-10-26 – 2015-10-27 (×2): 60 mg via INTRAVENOUS
  Filled 2015-10-25 (×2): qty 2

## 2015-10-25 MED ORDER — LEVALBUTEROL HCL 0.63 MG/3ML IN NEBU
0.6300 mg | INHALATION_SOLUTION | Freq: Four times a day (QID) | RESPIRATORY_TRACT | Status: DC | PRN
  Administered 2015-10-25 – 2015-10-26 (×4): 0.63 mg via RESPIRATORY_TRACT
  Filled 2015-10-25 (×4): qty 3

## 2015-10-25 MED ORDER — PERFLUTREN LIPID MICROSPHERE
1.0000 mL | INTRAVENOUS | Status: AC | PRN
  Administered 2015-10-25: 2 mL via INTRAVENOUS

## 2015-10-25 MED ORDER — IPRATROPIUM BROMIDE 0.02 % IN SOLN
0.5000 mg | Freq: Four times a day (QID) | RESPIRATORY_TRACT | Status: DC
  Administered 2015-10-25 – 2015-10-27 (×7): 0.5 mg via RESPIRATORY_TRACT
  Filled 2015-10-25 (×7): qty 2.5

## 2015-10-25 MED ORDER — HALOPERIDOL LACTATE 5 MG/ML IJ SOLN
5.0000 mg | Freq: Once | INTRAMUSCULAR | Status: AC
  Administered 2015-10-25: 5 mg via INTRAVENOUS
  Filled 2015-10-25: qty 1

## 2015-10-25 MED ORDER — HALOPERIDOL LACTATE 5 MG/ML IJ SOLN
5.0000 mg | Freq: Once | INTRAMUSCULAR | Status: DC
Start: 2015-10-25 — End: 2015-11-02
  Filled 2015-10-25: qty 1

## 2015-10-25 MED ORDER — LORAZEPAM 2 MG/ML IJ SOLN
2.0000 mg | Freq: Once | INTRAMUSCULAR | Status: AC
  Administered 2015-10-25: 2 mg via INTRAVENOUS

## 2015-10-25 NOTE — Progress Notes (Signed)
[facility] PROGRESS NOTE  Jeff Miles [id number] DOB: 08-16-1951 DOA: 10/24/2015 PCP: No PCP Per Patient  Assessment/Plan: Severe alcohol withdrawals -Continue Ativan as per CIWA protocol, additional Haldol provided. -Patient currently lying in bed, sleeping. -Earlier today had been hypertensive, trying to climb out of bed, with elevated respiratory and heart rate. CIWA score was 28 at the time.  Hypertensive urgency -It improved, worsens when his CIWA scores are elevated. -Labetalol drip was discontinued yesterday due to hypotension. -I wonder if he truly has hypertension whether this is related to his withdrawals. -2-D echo pending to evaluate systolic and diastolic function.  Alcohol abuse -Continue thiamine/folate  COPD with acute exacerbation -Seems improved today, no wheezing on exam, continued to titrate steroids.  Acute hypoxemic respiratory failure -Improved, continue oxygen as required. -Due to COPD with exacerbation as well as severe alcohol withdrawals.  Code Status: Full code Family Communication: Son tray at bedside updated on plan of care and all questions answered  Disposition Plan: Keep in ICU today   Consultants:  None   Antibiotics:  Levaquin   Subjective: Currently resting comfortably  Objective: Filed Vitals:   10/25/15 0600 10/25/15 0700 10/25/15 0800 10/25/15 0900  BP: 186/113 169/126 156/114 115/64  Pulse: 176 116 126 102  Temp:      TempSrc:      Resp: 25 22 29 23   Height:      Weight:      SpO2: 68% 94% 93% 89%    Intake/Output Summary (Last 24 hours) at 10/25/15 0949 Last data filed at 10/25/15 0200  Gross per 24 hour  Intake    630 ml  Output      0 ml  Net    630 ml   Filed Weights   10/24/15 0905 10/24/15 1635  Weight: 90.719 kg (200 lb) 97.9 kg (215 lb 13.3 oz)    Exam:   General:  Drowsy  Cardiovascular: Regular rate and rhythm, no murmurs, rubs or gallops  Respiratory: Clear to  auscultation bilaterally  Abdomen: Obese, soft, nontender, nondistended, positive bowel sounds  Extremities: Trace bilateral edema   Neurologic:  Unable to assess given current mental state  Data Reviewed: Basic Metabolic Panel:  Recent Labs Lab 10/24/15 0907 10/25/15 0435  NA 138 136  K 4.2 4.2  CL 97* 97*  CO2 31 28  GLUCOSE 134* 161*  BUN 10 18  CREATININE 1.07 1.14  CALCIUM 9.6 9.3   Liver Function Tests:  Recent Labs Lab 10/25/15 0435  AST 40  ALT 50  ALKPHOS 70  BILITOT 0.4  PROT 6.7  ALBUMIN 3.7   No results for input(s): LIPASE, AMYLASE in the last 168 hours. No results for input(s): AMMONIA in the last 168 hours. CBC:  Recent Labs Lab 10/24/15 0907 10/25/15 0435  WBC 8.4 7.3  HGB 17.5* 16.3  HCT 51.7 49.2  MCV 102.8* 102.7*  PLT 133* 146*   Cardiac Enzymes:  Recent Labs Lab 10/24/15 0907  TROPONINI 0.03   BNP (last 3 results)  Recent Labs  10/24/15 0907  BNP 61.0    ProBNP (last 3 results) No results for input(s): PROBNP in the last 8760 hours.  CBG: No results for input(s): GLUCAP in the last 168 hours.  Recent Results (from the past 240 hour(s))  MRSA PCR Screening     Status: None   Collection Time: 10/24/15  5:20 PM  Result Value Ref Range Status   MRSA by PCR NEGATIVE NEGATIVE Final  Comment:        The GeneXpert MRSA Assay (FDA approved for NASAL specimens only), is one component of a comprehensive MRSA colonization surveillance program. It is not intended to diagnose MRSA infection nor to guide or monitor treatment for MRSA infections.      Studies: Dg Chest 2 View  10/24/2015  CLINICAL DATA:  Productive cough for several months with some shortness of breath, patient smokes EXAM: CHEST  2 VIEW COMPARISON:  11/25/2004 FINDINGS: Stable mild cardiac enlargement. Vascular pattern normal. No consolidation or effusion. Stable mild bronchitic change. IMPRESSION: Mild chronic bronchitic change.  No acute findings.  Electronically Signed   By: Skipper Cliche M.D.   On: 10/24/2015 09:38   Dg Chest Port 1 View  10/25/2015  CLINICAL DATA:  Shortness of breath.  Hypertension. EXAM: PORTABLE CHEST 1 VIEW COMPARISON:  October 24, 2015 FINDINGS: There is mild central peribronchial thickening. No edema or consolidation. Heart size and pulmonary vascularity normal. No adenopathy. No bone lesions. IMPRESSION: Changes suggesting a degree of chronic bronchitis remain. No edema or consolidation. No change in cardiac silhouette. Electronically Signed   By: Lowella Grip III M.D.   On: 10/25/2015 08:48    Scheduled Meds: . dextromethorphan-guaiFENesin  1 tablet Oral BID  . enoxaparin (LOVENOX) injection  40 mg Subcutaneous Q24H  . folic acid  1 mg Oral Daily  . haloperidol lactate  5 mg Intravenous Once  . ipratropium  0.5 mg Nebulization Q6H  . levofloxacin (LEVAQUIN) IV  750 mg Intravenous Q24H  . LORazepam  0-4 mg Intravenous Q6H   Followed by  . [START ON 10/26/2015] LORazepam  0-4 mg Intravenous Q12H  . methylPREDNISolone (SOLU-MEDROL) injection  60 mg Intravenous Q12H  . multivitamin with minerals  1 tablet Oral Daily  . sodium chloride flush  3 mL Intravenous Q12H  . thiamine  100 mg Oral Daily   Or  . thiamine  100 mg Intravenous Daily   Continuous Infusions:   Principal Problem:   COPD exacerbation (HCC) Active Problems:   Bronchospasm   Hypoxemia   URI (upper respiratory infection)   Hypertensive urgency   Alcohol withdrawal ([place])    Time spent: 25 minutes. Greater than 50% of this time was spent in direct contact with the patient coordinating care.    Lelon Frohlich  [facility] Pager [phone]  If 7PM-7AM, please contact night-coverage at [email or web address], password [facility] 10/25/2015, 9:49 AM  LOS: 1 day

## 2015-10-25 NOTE — Progress Notes (Signed)
  Echocardiogram 2D Echocardiogram has been performed.  Jeff Miles 10/25/2015, 10:32 AM

## 2015-10-25 NOTE — Progress Notes (Signed)
Additional 2mg  ativan given iv to total 4mg  for severe agitation and restlessness. Pt attempting to get oob.

## 2015-10-26 DIAGNOSIS — F10231 Alcohol dependence with withdrawal delirium: Secondary | ICD-10-CM

## 2015-10-26 LAB — BASIC METABOLIC PANEL
Anion gap: 8 (ref 5–15)
BUN: 18 mg/dL (ref 6–20)
CO2: 32 mmol/L (ref 22–32)
CREATININE: 0.97 mg/dL (ref 0.61–1.24)
Calcium: 9.3 mg/dL (ref 8.9–10.3)
Chloride: 98 mmol/L — ABNORMAL LOW (ref 101–111)
GFR calc Af Amer: >60 mL/min (ref >60)
GLUCOSE: 115 mg/dL — AB (ref 65–99)
Potassium: 3.8 mmol/L (ref 3.5–5.1)
SODIUM: 138 mmol/L (ref 135–145)

## 2015-10-26 LAB — CBC
HEMATOCRIT: 48.3 % (ref 39.0–52.0)
Hemoglobin: 15.6 g/dL (ref 13.0–17.0)
MCH: 34 pg (ref 26.0–34.0)
MCHC: 32.3 g/dL (ref 30.0–36.0)
MCV: 105.2 fL — ABNORMAL HIGH (ref 78.0–100.0)
PLATELETS: 114 10*3/uL — AB (ref 150–400)
RBC: 4.59 MIL/uL (ref 4.22–5.81)
RDW: 13.3 % (ref 11.5–15.5)
WBC: 10.1 10*3/uL (ref 4.0–10.5)

## 2015-10-26 MED ORDER — CETYLPYRIDINIUM CHLORIDE 0.05 % MT LIQD
7.0000 mL | Freq: Two times a day (BID) | OROMUCOSAL | Status: DC
Start: 2015-10-26 — End: 2015-11-11
  Administered 2015-10-26 – 2015-11-11 (×30): 7 mL via OROMUCOSAL

## 2015-10-26 MED ORDER — BENZONATATE 100 MG PO CAPS
100.0000 mg | ORAL_CAPSULE | Freq: Two times a day (BID) | ORAL | Status: DC | PRN
  Administered 2015-10-26 – 2015-10-31 (×3): 100 mg via ORAL
  Filled 2015-10-26 (×3): qty 1

## 2015-10-26 MED ORDER — HYDRALAZINE HCL 20 MG/ML IJ SOLN
5.0000 mg | Freq: Four times a day (QID) | INTRAMUSCULAR | Status: DC | PRN
  Administered 2015-10-26 – 2015-11-03 (×6): 5 mg via INTRAVENOUS
  Filled 2015-10-26 (×6): qty 1

## 2015-10-26 MED ORDER — FUROSEMIDE 10 MG/ML IJ SOLN
20.0000 mg | Freq: Two times a day (BID) | INTRAMUSCULAR | Status: DC
  Administered 2015-10-26 (×2): 20 mg via INTRAVENOUS
  Filled 2015-10-26 (×2): qty 2

## 2015-10-26 MED ORDER — LISINOPRIL 10 MG PO TABS
10.0000 mg | ORAL_TABLET | Freq: Every day | ORAL | Status: DC
  Administered 2015-10-26 – 2015-10-31 (×4): 10 mg via ORAL
  Filled 2015-10-26 (×4): qty 1

## 2015-10-26 MED ORDER — LORAZEPAM 2 MG/ML IJ SOLN
4.0000 mg | Freq: Once | INTRAMUSCULAR | Status: AC
  Administered 2015-10-26: 4 mg via INTRAVENOUS

## 2015-10-26 MED ORDER — HALOPERIDOL LACTATE 5 MG/ML IJ SOLN
5.0000 mg | Freq: Once | INTRAMUSCULAR | Status: AC
  Administered 2015-10-26: 5 mg via INTRAVENOUS
  Filled 2015-10-26: qty 1

## 2015-10-26 MED ORDER — LORAZEPAM 2 MG/ML IJ SOLN
0.0000 mg | INTRAMUSCULAR | Status: AC
  Administered 2015-10-26: 2 mg via INTRAVENOUS
  Administered 2015-10-26 (×2): 4 mg via INTRAVENOUS
  Administered 2015-10-27 (×3): 2 mg via INTRAVENOUS
  Administered 2015-10-27: 4 mg via INTRAVENOUS
  Administered 2015-10-27 – 2015-10-28 (×3): 2 mg via INTRAVENOUS
  Administered 2015-10-28: 1 mg via INTRAVENOUS
  Administered 2015-10-28: 2 mg via INTRAVENOUS
  Filled 2015-10-26 (×2): qty 1
  Filled 2015-10-26: qty 2
  Filled 2015-10-26 (×4): qty 1
  Filled 2015-10-26 (×3): qty 2
  Filled 2015-10-26: qty 1

## 2015-10-26 MED ORDER — ALBUTEROL SULFATE (2.5 MG/3ML) 0.083% IN NEBU
2.5000 mg | INHALATION_SOLUTION | RESPIRATORY_TRACT | Status: DC | PRN
  Administered 2015-10-26 – 2015-10-27 (×3): 2.5 mg via RESPIRATORY_TRACT
  Filled 2015-10-26 (×3): qty 3

## 2015-10-26 MED ORDER — HYDROCOD POLST-CPM POLST ER 10-8 MG/5ML PO SUER
5.0000 mL | Freq: Two times a day (BID) | ORAL | Status: DC | PRN
  Administered 2015-10-26 – 2015-11-09 (×4): 5 mL via ORAL
  Filled 2015-10-26 (×4): qty 5

## 2015-10-26 MED ORDER — DEXMEDETOMIDINE HCL IN NACL 200 MCG/50ML IV SOLN
0.2000 ug/kg/h | INTRAVENOUS | Status: AC
  Administered 2015-10-26: 1.2 ug/kg/h via INTRAVENOUS
  Administered 2015-10-26: 0.4 ug/kg/h via INTRAVENOUS
  Administered 2015-10-26 (×2): 1.2 ug/kg/h via INTRAVENOUS
  Administered 2015-10-27: 0.8 ug/kg/h via INTRAVENOUS
  Administered 2015-10-27: 0.9 ug/kg/h via INTRAVENOUS
  Administered 2015-10-27: 0.8 ug/kg/h via INTRAVENOUS
  Administered 2015-10-27: 0.9 ug/kg/h via INTRAVENOUS
  Administered 2015-10-27 (×2): 0.8 ug/kg/h via INTRAVENOUS
  Administered 2015-10-27: 0.801 ug/kg/h via INTRAVENOUS
  Administered 2015-10-27: 1 ug/kg/h via INTRAVENOUS
  Administered 2015-10-27: 0.8 ug/kg/h via INTRAVENOUS
  Filled 2015-10-26: qty 50
  Filled 2015-10-26: qty 100
  Filled 2015-10-26: qty 150
  Filled 2015-10-26 (×4): qty 50
  Filled 2015-10-26: qty 100
  Filled 2015-10-26 (×2): qty 50

## 2015-10-26 MED ORDER — LORAZEPAM 2 MG/ML IJ SOLN
0.0000 mg | Freq: Two times a day (BID) | INTRAMUSCULAR | Status: DC
  Administered 2015-10-29: 2 mg via INTRAVENOUS
  Filled 2015-10-26 (×2): qty 1

## 2015-10-26 NOTE — Progress Notes (Signed)
[facility] PROGRESS NOTE  JACE FREIER [id number] DOB: Dec 20, 1950 DOA: 10/24/2015 PCP: No PCP Per Patient  Assessment/Plan: Severe alcohol withdrawals -Improved today. -Continue Ativan as per CIWA protocol.  Hypertensive urgency -It improved, worsens when his CIWA scores are elevated. -Labetalol drip was discontinued yesterday due to hypotension. -Given his diastolic CHF as evidenced on 2-D echo, will start on ACE inhibitor. Second option would be a beta blocker if blood pressure not well controlled on ACE inhibitor.  Acute diastolic CHF -2-D echo with ejection fraction of 50-55% and grade 1 diastolic dysfunction. -He is 1 L negative since admission. -Lasix 20 mg IV twice a day.  Alcohol abuse -Continue thiamine/folate  COPD with acute exacerbation -Seems improved today, although still slight wheezing on exam. -Continue Levaquin, continue steroids at current dose, nebs.  Acute hypoxemic respiratory failure -Improved, continue oxygen as required. -Due to COPD with exacerbation as well as severe alcohol withdrawals.  Code Status: Full code Family Communication: Son tray at bedside updated on plan of care and all questions answered  Disposition Plan: Transfer to telemetry.   Consultants:  None   Antibiotics:  Levaquin   Subjective: Sitting outside of bed, no complaints of chest pain. Complains of significant cough.  Objective: Filed Vitals:   10/26/15 0500 10/26/15 0600 10/26/15 0826 10/26/15 0906  BP: 141/92 164/99    Pulse: 89 97    Temp:   97 F (36.1 C)   TempSrc:   Oral   Resp: 22 22    Height:      Weight:      SpO2: 94% 93%  92%    Intake/Output Summary (Last 24 hours) at 10/26/15 1038 Last data filed at 10/26/15 0852  Gross per 24 hour  Intake    986 ml  Output   2600 ml  Net  -1614 ml   Filed Weights   10/24/15 0905 10/24/15 1635  Weight: 90.719 kg (200 lb) 97.9 kg (215 lb 13.3 oz)    Exam:   General:  Alert,  awake  Cardiovascular: Regular rate and rhythm, no murmurs, rubs or gallops  Respiratory: Mild bibasilar expiratory wheezes   Abdomen: Obese, soft, nontender, nondistended, positive bowel sounds  Extremities: Trace bilateral edema   Neurologic:  Unable to assess given current mental state  Data Reviewed: Basic Metabolic Panel:  Recent Labs Lab 10/24/15 0907 10/25/15 0435 10/26/15 0450  NA 138 136 138  K 4.2 4.2 3.8  CL 97* 97* 98*  CO2 31 28 32  GLUCOSE 134* 161* 115*  BUN 10 18 18   CREATININE 1.07 1.14 0.97  CALCIUM 9.6 9.3 9.3   Liver Function Tests:  Recent Labs Lab 10/25/15 0435  AST 40  ALT 50  ALKPHOS 70  BILITOT 0.4  PROT 6.7  ALBUMIN 3.7   No results for input(s): LIPASE, AMYLASE in the last 168 hours. No results for input(s): AMMONIA in the last 168 hours. CBC:  Recent Labs Lab 10/24/15 0907 10/25/15 0435 10/26/15 0450  WBC 8.4 7.3 10.1  HGB 17.5* 16.3 15.6  HCT 51.7 49.2 48.3  MCV 102.8* 102.7* 105.2*  PLT 133* 146* 114*   Cardiac Enzymes:  Recent Labs Lab 10/24/15 0907  TROPONINI 0.03   BNP (last 3 results)  Recent Labs  10/24/15 0907  BNP 61.0    ProBNP (last 3 results) No results for input(s): PROBNP in the last 8760 hours.  CBG: No results for input(s): GLUCAP in the last 168 hours.  Recent Results (from the  past 240 hour(s))  MRSA PCR Screening     Status: None   Collection Time: 10/24/15  5:20 PM  Result Value Ref Range Status   MRSA by PCR NEGATIVE NEGATIVE Final    Comment:        The GeneXpert MRSA Assay (FDA approved for NASAL specimens only), is one component of a comprehensive MRSA colonization surveillance program. It is not intended to diagnose MRSA infection nor to guide or monitor treatment for MRSA infections.      Studies: Dg Chest Port 1 View  10/25/2015  CLINICAL DATA:  Shortness of breath.  Hypertension. EXAM: PORTABLE CHEST 1 VIEW COMPARISON:  October 24, 2015 FINDINGS: There is mild  central peribronchial thickening. No edema or consolidation. Heart size and pulmonary vascularity normal. No adenopathy. No bone lesions. IMPRESSION: Changes suggesting a degree of chronic bronchitis remain. No edema or consolidation. No change in cardiac silhouette. Electronically Signed   By: Lowella Grip III M.D.   On: 10/25/2015 08:48    Scheduled Meds: . dextromethorphan-guaiFENesin  1 tablet Oral BID  . enoxaparin (LOVENOX) injection  40 mg Subcutaneous Q24H  . folic acid  1 mg Oral Daily  . haloperidol lactate  5 mg Intravenous Once  . ipratropium  0.5 mg Nebulization Q6H  . levofloxacin (LEVAQUIN) IV  750 mg Intravenous Q24H  . LORazepam  0-4 mg Intravenous Q6H   Followed by  . LORazepam  0-4 mg Intravenous Q12H  . methylPREDNISolone (SOLU-MEDROL) injection  60 mg Intravenous Q24H  . multivitamin with minerals  1 tablet Oral Daily  . sodium chloride flush  3 mL Intravenous Q12H  . thiamine  100 mg Oral Daily   Or  . thiamine  100 mg Intravenous Daily   Continuous Infusions:   Principal Problem:   COPD exacerbation (HCC) Active Problems:   Bronchospasm   Hypoxemia   URI (upper respiratory infection)   Hypertensive urgency   Alcohol withdrawal ([place])    Time spent: 25 minutes. Greater than 50% of this time was spent in direct contact with the patient coordinating care.    Lelon Frohlich  [facility] Pager [phone]  If 7PM-7AM, please contact night-coverage at [email or web address], password [facility] 10/26/2015, 10:38 AM  LOS: 2 days

## 2015-10-26 NOTE — Progress Notes (Signed)
Pt experienced progressively worsening agitation today.  Orders obtained to increase CIWA monitoring and ativan coverage around 1300.  Despite ativan administration, pt continued to have worse agitation.  Pt bladder scanned after his fall, and bladder scan showed urine retained >82mL.  MD notified and orders obtained for foley catheter insertion.  Pt's agitation continued to worsen, and MD ordered precedex drip.  Swing shift MD came to floor before administration to physically assess the pt.  His assistance was greatly appreciated.

## 2015-10-26 NOTE — Progress Notes (Signed)
Pt fell at approximately 1700.  NT found pt in the floor beside the bed.  Pt was assisted to the [facility].  He had no bruises or c/o pain.  AC notified. MD notified.  Pt's family notified.

## 2015-10-27 DIAGNOSIS — R0902 Hypoxemia: Secondary | ICD-10-CM

## 2015-10-27 LAB — URINALYSIS, ROUTINE W REFLEX MICROSCOPIC
Bilirubin Urine: NEGATIVE
Glucose, UA: NEGATIVE mg/dL
Hgb urine dipstick: NEGATIVE
Ketones, ur: NEGATIVE mg/dL
Leukocytes, UA: NEGATIVE
NITRITE: NEGATIVE
Protein, ur: 30 mg/dL — AB
SPECIFIC GRAVITY, URINE: 1.01 (ref 1.005–1.030)
pH: 6 (ref 5.0–8.0)

## 2015-10-27 LAB — CBC
HCT: 48.5 % (ref 39.0–52.0)
HEMOGLOBIN: 15.4 g/dL (ref 13.0–17.0)
MCH: 33.5 pg (ref 26.0–34.0)
MCHC: 31.8 g/dL (ref 30.0–36.0)
MCV: 105.4 fL — ABNORMAL HIGH (ref 78.0–100.0)
Platelets: 129 10*3/uL — ABNORMAL LOW (ref 150–400)
RBC: 4.6 MIL/uL (ref 4.22–5.81)
RDW: 12.9 % (ref 11.5–15.5)
WBC: 8.9 10*3/uL (ref 4.0–10.5)

## 2015-10-27 LAB — BASIC METABOLIC PANEL
ANION GAP: 9 (ref 5–15)
BUN: 20 mg/dL (ref 6–20)
CALCIUM: 9.4 mg/dL (ref 8.9–10.3)
CO2: 34 mmol/L — ABNORMAL HIGH (ref 22–32)
Chloride: 99 mmol/L — ABNORMAL LOW (ref 101–111)
Creatinine, Ser: 1.13 mg/dL (ref 0.61–1.24)
GFR calc Af Amer: >60 mL/min (ref >60)
Glucose, Bld: 139 mg/dL — ABNORMAL HIGH (ref 65–99)
POTASSIUM: 4.2 mmol/L (ref 3.5–5.1)
SODIUM: 142 mmol/L (ref 135–145)

## 2015-10-27 LAB — URINE MICROSCOPIC-ADD ON

## 2015-10-27 MED ORDER — FUROSEMIDE 10 MG/ML IJ SOLN
20.0000 mg | Freq: Every day | INTRAMUSCULAR | Status: DC
  Administered 2015-10-28 – 2015-10-31 (×4): 20 mg via INTRAVENOUS
  Filled 2015-10-27 (×4): qty 2

## 2015-10-27 MED ORDER — METHYLPREDNISOLONE SODIUM SUCC 40 MG IJ SOLR
40.0000 mg | INTRAMUSCULAR | Status: DC
Start: 2015-10-28 — End: 2015-10-28
  Administered 2015-10-28: 40 mg via INTRAVENOUS
  Filled 2015-10-27: qty 1

## 2015-10-27 MED ORDER — IPRATROPIUM-ALBUTEROL 0.5-2.5 (3) MG/3ML IN SOLN
3.0000 mL | Freq: Four times a day (QID) | RESPIRATORY_TRACT | Status: DC
  Administered 2015-10-27 – 2015-10-28 (×5): 3 mL via RESPIRATORY_TRACT
  Filled 2015-10-27 (×4): qty 3

## 2015-10-27 MED ORDER — LORAZEPAM 2 MG/ML IJ SOLN
1.0000 mg | INTRAMUSCULAR | Status: DC | PRN
  Administered 2015-10-27 – 2015-10-29 (×7): 4 mg via INTRAVENOUS
  Filled 2015-10-27 (×7): qty 2

## 2015-10-27 NOTE — Progress Notes (Signed)
PT REMAINS ON PRECEDEX DRIP AT 0.8MCG/KG/HR. HE HAS RESTED WELL THIS 12HR SHIFT. WE ARE STARTING TO WEAN ATIVAN DOSES.

## 2015-10-27 NOTE — Progress Notes (Signed)
[facility] PROGRESS NOTE  Jeff Miles [id number] DOB: Dec 22, 1950 DOA: 10/24/2015 PCP: No PCP Per Patient  Assessment/Plan: Severe alcohol withdrawals -Worsened yesterday requiring precedex drip. -Currently seems agitated and restless but is staying calmly in bed. -Wife reports beer intake of 24-36 a day. -Continue precedex today.  Hypertensive urgency -It improved, worsens when his CIWA scores are elevated. -Given his diastolic CHF as evidenced on 2-D echo, was started on an ACE inhibitor. Second option would be a beta blocker if blood pressure not well controlled on ACE inhibitor. -Receiving PRN hydralazine as well.  Acute diastolic CHF -2-D echo with ejection fraction of 50-55% and grade 1 diastolic dysfunction. -He is 2.9 L negative since admission. -Decrease lasix to 20 mg IV daily.  Alcohol abuse -Continue thiamine/folate  COPD with acute exacerbation -Resolved. -Continue Levaquin, nebs. -Start steroid titration.  Acute hypoxemic respiratory failure -Improved, continue oxygen as required. -Due to COPD with exacerbation as well as severe alcohol withdrawals.  Code Status: Full code Family Communication: No family members at bedside. Discussed with wife at bedside 2/12. Disposition Plan: Keep in ICU today.   Consultants:  None   Antibiotics:  Levaquin   Subjective: In bed, restless, not communicative.  Objective: Filed Vitals:   10/27/15 0630 10/27/15 0700 10/27/15 0757 10/27/15 0832  BP: 149/91 154/94    Pulse: 83 99    Temp:   99 F (37.2 C)   TempSrc:      Resp: 28     Height:      Weight:      SpO2: 92%   96%    Intake/Output Summary (Last 24 hours) at 10/27/15 1020 Last data filed at 10/27/15 0600  Gross per 24 hour  Intake 1150.2 ml  Output   3150 ml  Net -1999.8 ml   Filed Weights   10/24/15 0905 10/24/15 1635 10/27/15 0500  Weight: 90.719 kg (200 lb) 97.9 kg (215 lb 13.3 oz) 96.5 kg (212 lb 11.9 oz)     Exam:   General:  Alert, awake  Cardiovascular: Regular rate and rhythm, no murmurs, rubs or gallops  Respiratory: Mild bibasilar expiratory wheezes   Abdomen: Obese, soft, nontender, nondistended, positive bowel sounds  Extremities: Trace bilateral edema   Neurologic:  Unable to assess given current mental state  Data Reviewed: Basic Metabolic Panel:  Recent Labs Lab 10/24/15 0907 10/25/15 0435 10/26/15 0450 10/27/15 0453  NA 138 136 138 142  K 4.2 4.2 3.8 4.2  CL 97* 97* 98* 99*  CO2 31 28 32 34*  GLUCOSE 134* 161* 115* 139*  BUN 10 18 18 20   CREATININE 1.07 1.14 0.97 1.13  CALCIUM 9.6 9.3 9.3 9.4   Liver Function Tests:  Recent Labs Lab 10/25/15 0435  AST 40  ALT 50  ALKPHOS 70  BILITOT 0.4  PROT 6.7  ALBUMIN 3.7   No results for input(s): LIPASE, AMYLASE in the last 168 hours. No results for input(s): AMMONIA in the last 168 hours. CBC:  Recent Labs Lab 10/24/15 0907 10/25/15 0435 10/26/15 0450 10/27/15 0453  WBC 8.4 7.3 10.1 8.9  HGB 17.5* 16.3 15.6 15.4  HCT 51.7 49.2 48.3 48.5  MCV 102.8* 102.7* 105.2* 105.4*  PLT 133* 146* 114* 129*   Cardiac Enzymes:  Recent Labs Lab 10/24/15 0907  TROPONINI 0.03   BNP (last 3 results)  Recent Labs  10/24/15 0907  BNP 61.0    ProBNP (last 3 results) No results for input(s): PROBNP in the last 8760  hours.  CBG: No results for input(s): GLUCAP in the last 168 hours.  Recent Results (from the past 240 hour(s))  MRSA PCR Screening     Status: None   Collection Time: 10/24/15  5:20 PM  Result Value Ref Range Status   MRSA by PCR NEGATIVE NEGATIVE Final    Comment:        The GeneXpert MRSA Assay (FDA approved for NASAL specimens only), is one component of a comprehensive MRSA colonization surveillance program. It is not intended to diagnose MRSA infection nor to guide or monitor treatment for MRSA infections.      Studies: No results found.  Scheduled Meds: . antiseptic  oral rinse  7 mL Mouth Rinse BID  . dextromethorphan-guaiFENesin  1 tablet Oral BID  . enoxaparin (LOVENOX) injection  40 mg Subcutaneous Q24H  . folic acid  1 mg Oral Daily  . furosemide  20 mg Intravenous Q12H  . haloperidol lactate  5 mg Intravenous Once  . ipratropium  0.5 mg Nebulization Q6H  . levofloxacin (LEVAQUIN) IV  750 mg Intravenous Q24H  . lisinopril  10 mg Oral Daily  . LORazepam  0-4 mg Intravenous 6 times per day   Followed by  . [START ON 10/28/2015] LORazepam  0-4 mg Intravenous Q12H  . methylPREDNISolone (SOLU-MEDROL) injection  60 mg Intravenous Q24H  . multivitamin with minerals  1 tablet Oral Daily  . sodium chloride flush  3 mL Intravenous Q12H  . thiamine  100 mg Oral Daily   Or  . thiamine  100 mg Intravenous Daily   Continuous Infusions: . dexmedetomidine 0.8 mcg/kg/hr (10/27/15 0900)    Principal Problem:   COPD exacerbation (HCC) Active Problems:   Bronchospasm   Hypoxemia   URI (upper respiratory infection)   Hypertensive urgency   Alcohol withdrawal ([place])    Time spent: 25 minutes. Greater than 50% of this time was spent in direct contact with the patient coordinating care.    Lelon Frohlich  [facility] Pager [phone]  If 7PM-7AM, please contact night-coverage at [email or web address], password [facility] 10/27/2015, 10:20 AM  LOS: 3 days

## 2015-10-27 NOTE — Progress Notes (Signed)
eLink Physician-Brief Progress Note Patient Name: Jeff Miles DOB: August 17, 1951 MRN: [id number]   Date of Service  10/27/2015  HPI/Events of Note  precedex gtt order expires ETOH withdrawal  eICU Interventions  Try frequent ativan dosing - if breakthrough, may renew precedex     Intervention Category Major Interventions: Delirium, psychosis, severe agitation - evaluation and management  [name] V. 10/27/2015, 9:29 PM

## 2015-10-28 LAB — CBC
HCT: 53.6 % — ABNORMAL HIGH (ref 39.0–52.0)
Hemoglobin: 17.7 g/dL — ABNORMAL HIGH (ref 13.0–17.0)
MCH: 34.1 pg — ABNORMAL HIGH (ref 26.0–34.0)
MCHC: 33 g/dL (ref 30.0–36.0)
MCV: 103.3 fL — AB (ref 78.0–100.0)
PLATELETS: 120 10*3/uL — AB (ref 150–400)
RBC: 5.19 MIL/uL (ref 4.22–5.81)
RDW: 12.7 % (ref 11.5–15.5)
WBC: 10.5 10*3/uL (ref 4.0–10.5)

## 2015-10-28 LAB — BASIC METABOLIC PANEL
Anion gap: 12 (ref 5–15)
BUN: 22 mg/dL — AB (ref 6–20)
CALCIUM: 9.7 mg/dL (ref 8.9–10.3)
CHLORIDE: 101 mmol/L (ref 101–111)
CO2: 30 mmol/L (ref 22–32)
CREATININE: 1 mg/dL (ref 0.61–1.24)
GFR calc non Af Amer: >60 mL/min (ref >60)
Glucose, Bld: 134 mg/dL — ABNORMAL HIGH (ref 65–99)
Potassium: 3.7 mmol/L (ref 3.5–5.1)
SODIUM: 143 mmol/L (ref 135–145)

## 2015-10-28 MED ORDER — IPRATROPIUM-ALBUTEROL 0.5-2.5 (3) MG/3ML IN SOLN
3.0000 mL | Freq: Three times a day (TID) | RESPIRATORY_TRACT | Status: DC
  Administered 2015-10-29: 3 mL via RESPIRATORY_TRACT
  Filled 2015-10-28 (×2): qty 3

## 2015-10-28 MED ORDER — DEXMEDETOMIDINE HCL IN NACL 200 MCG/50ML IV SOLN
0.2000 ug/kg/h | INTRAVENOUS | Status: DC
  Administered 2015-10-28: 0.2 ug/kg/h via INTRAVENOUS
  Administered 2015-10-28 (×3): 1.1 ug/kg/h via INTRAVENOUS
  Administered 2015-10-28: 0.6 ug/kg/h via INTRAVENOUS
  Administered 2015-10-28: 1.1 ug/kg/h via INTRAVENOUS
  Administered 2015-10-28 (×2): 1.2 ug/kg/h via INTRAVENOUS
  Administered 2015-10-28 (×2): 1.1 ug/kg/h via INTRAVENOUS
  Administered 2015-10-29 (×2): 1.2 ug/kg/h via INTRAVENOUS
  Administered 2015-10-29: 1 ug/kg/h via INTRAVENOUS
  Filled 2015-10-28 (×12): qty 50

## 2015-10-28 MED ORDER — METHYLPREDNISOLONE SODIUM SUCC 40 MG IJ SOLR
30.0000 mg | INTRAMUSCULAR | Status: DC
  Administered 2015-10-29: 30 mg via INTRAVENOUS
  Filled 2015-10-28: qty 1

## 2015-10-28 NOTE — Progress Notes (Signed)
[facility] PROGRESS NOTE  Jeff Miles [id number] DOB: Jul 04, 1951 DOA: 10/24/2015 PCP: No PCP Per Patient  Assessment/Plan: Severe alcohol withdrawals -Improved overnight, however required reinitiation of Precedex drip this a.m. due to increased agitation and restlessness. -Currently quite lethargic. -Wife reports beer intake of 24-36 a day. -Continue precedex today.  Hypertensive urgency -It improved, worsens when his CIWA scores are elevated. -Given his diastolic CHF as evidenced on 2-D echo, was started on an ACE inhibitor. Second option would be a beta blocker if blood pressure not well controlled on ACE inhibitor. -Receiving PRN hydralazine as well.  Acute diastolic CHF -2-D echo with ejection fraction of 50-55% and grade 1 diastolic dysfunction. -He is 3.3 L negative since admission. -Decrease lasix to 20 mg IV daily.  Alcohol abuse -Continue thiamine/folate -Currently undergoing withdrawals, see above for details.  COPD with acute exacerbation -Resolved. -Continue Levaquin (last day 2/14), nebs. -Continue steroid titration.  Acute hypoxemic respiratory failure -Improved, continue oxygen as required. -Due to COPD with exacerbation as well as severe alcohol withdrawals.  Code Status: Full code Family Communication: No family members at bedside. Discussed with wife at bedside 2/12. Disposition Plan: Keep in ICU today.   Consultants:  None   Antibiotics:  Levaquin   Subjective: In bed, currently sedated on Precedex drip  Objective: Filed Vitals:   10/28/15 0734 10/28/15 0800 10/28/15 0900 10/28/15 1000  BP:  123/81 117/78 115/80  Pulse:  110 94 83  Temp: 98.1 F (36.7 C)     TempSrc: Axillary     Resp:  33 24 21  Height:      Weight:      SpO2:  94% 94% 94%    Intake/Output Summary (Last 24 hours) at 10/28/15 1007 Last data filed at 10/28/15 1000  Gross per 24 hour  Intake  332.3 ml  Output    700 ml  Net -367.7 ml    Filed Weights   10/24/15 1635 10/27/15 0500 10/28/15 0500  Weight: 97.9 kg (215 lb 13.3 oz) 96.5 kg (212 lb 11.9 oz) 94.6 kg (208 lb 8.9 oz)    Exam:   General:  Alert, awake  Cardiovascular: Regular rate and rhythm, no murmurs, rubs or gallops  Respiratory: Fair air movement, clear to auscultation bilaterally.  Abdomen: Obese, soft, nontender, nondistended, positive bowel sounds  Extremities: Trace bilateral edema   Neurologic:  Unable to assess given current mental state  Data Reviewed: Basic Metabolic Panel:  Recent Labs Lab 10/24/15 0907 10/25/15 0435 10/26/15 0450 10/27/15 0453  NA 138 136 138 142  K 4.2 4.2 3.8 4.2  CL 97* 97* 98* 99*  CO2 31 28 32 34*  GLUCOSE 134* 161* 115* 139*  BUN 10 18 18 20   CREATININE 1.07 1.14 0.97 1.13  CALCIUM 9.6 9.3 9.3 9.4   Liver Function Tests:  Recent Labs Lab 10/25/15 0435  AST 40  ALT 50  ALKPHOS 70  BILITOT 0.4  PROT 6.7  ALBUMIN 3.7   No results for input(s): LIPASE, AMYLASE in the last 168 hours. No results for input(s): AMMONIA in the last 168 hours. CBC:  Recent Labs Lab 10/24/15 0907 10/25/15 0435 10/26/15 0450 10/27/15 0453 10/28/15 0656  WBC 8.4 7.3 10.1 8.9 10.5  HGB 17.5* 16.3 15.6 15.4 17.7*  HCT 51.7 49.2 48.3 48.5 53.6*  MCV 102.8* 102.7* 105.2* 105.4* 103.3*  PLT 133* 146* 114* 129* 120*   Cardiac Enzymes:  Recent Labs Lab 10/24/15 0907  TROPONINI 0.03   BNP (  last 3 results)  Recent Labs  10/24/15 0907  BNP 61.0    ProBNP (last 3 results) No results for input(s): PROBNP in the last 8760 hours.  CBG: No results for input(s): GLUCAP in the last 168 hours.  Recent Results (from the past 240 hour(s))  MRSA PCR Screening     Status: None   Collection Time: 10/24/15  5:20 PM  Result Value Ref Range Status   MRSA by PCR NEGATIVE NEGATIVE Final    Comment:        The GeneXpert MRSA Assay (FDA approved for NASAL specimens only), is one component of a comprehensive MRSA  colonization surveillance program. It is not intended to diagnose MRSA infection nor to guide or monitor treatment for MRSA infections.      Studies: No results found.  Scheduled Meds: . antiseptic oral rinse  7 mL Mouth Rinse BID  . dextromethorphan-guaiFENesin  1 tablet Oral BID  . enoxaparin (LOVENOX) injection  40 mg Subcutaneous Q24H  . folic acid  1 mg Oral Daily  . furosemide  20 mg Intravenous Daily  . haloperidol lactate  5 mg Intravenous Once  . ipratropium-albuterol  3 mL Nebulization Q6H  . levofloxacin (LEVAQUIN) IV  750 mg Intravenous Q24H  . lisinopril  10 mg Oral Daily  . LORazepam  0-4 mg Intravenous Q12H  . methylPREDNISolone (SOLU-MEDROL) injection  40 mg Intravenous Q24H  . multivitamin with minerals  1 tablet Oral Daily  . sodium chloride flush  3 mL Intravenous Q12H  . thiamine  100 mg Oral Daily   Or  . thiamine  100 mg Intravenous Daily   Continuous Infusions: . dexmedetomidine 1.1 mcg/kg/hr (10/28/15 0959)    Principal Problem:   COPD exacerbation (HCC) Active Problems:   Bronchospasm   Hypoxemia   URI (upper respiratory infection)   Hypertensive urgency   Alcohol withdrawal ([place])    Time spent: 25 minutes. Greater than 50% of this time was spent in direct contact with the patient coordinating care.    Lelon Frohlich  [facility] Pager [phone]  If 7PM-7AM, please contact night-coverage at [email or web address], password [facility] 10/28/2015, 10:07 AM  LOS: 4 days

## 2015-10-29 DIAGNOSIS — J9801 Acute bronchospasm: Secondary | ICD-10-CM

## 2015-10-29 LAB — GLUCOSE, CAPILLARY: Glucose-Capillary: 135 mg/dL — ABNORMAL HIGH (ref 65–99)

## 2015-10-29 MED ORDER — DEXMEDETOMIDINE HCL IN NACL 200 MCG/50ML IV SOLN
INTRAVENOUS | Status: AC
  Filled 2015-10-29: qty 50

## 2015-10-29 MED ORDER — PANTOPRAZOLE SODIUM 40 MG PO TBEC
40.0000 mg | DELAYED_RELEASE_TABLET | Freq: Every day | ORAL | Status: DC
  Administered 2015-10-29 – 2015-11-09 (×3): 40 mg via ORAL
  Filled 2015-10-29 (×6): qty 1

## 2015-10-29 MED ORDER — LEVALBUTEROL HCL 0.63 MG/3ML IN NEBU
0.6300 mg | INHALATION_SOLUTION | Freq: Three times a day (TID) | RESPIRATORY_TRACT | Status: DC
  Administered 2015-10-29 – 2015-11-03 (×16): 0.63 mg via RESPIRATORY_TRACT
  Filled 2015-10-29 (×17): qty 3

## 2015-10-29 MED ORDER — LORAZEPAM 2 MG/ML IJ SOLN
1.0000 mg | INTRAMUSCULAR | Status: DC | PRN
  Administered 2015-10-30 – 2015-11-06 (×7): 2 mg via INTRAVENOUS
  Filled 2015-10-29 (×7): qty 1

## 2015-10-29 MED ORDER — PREDNISONE 20 MG PO TABS
40.0000 mg | ORAL_TABLET | Freq: Every day | ORAL | Status: DC
  Filled 2015-10-29: qty 2

## 2015-10-29 MED ORDER — LORAZEPAM 2 MG/ML IJ SOLN
1.0000 mg/h | INTRAVENOUS | Status: DC
  Administered 2015-10-29: 2 mg/h via INTRAVENOUS
  Filled 2015-10-29: qty 25

## 2015-10-29 MED ORDER — DEXMEDETOMIDINE HCL IN NACL 200 MCG/50ML IV SOLN
0.2000 ug/kg/h | INTRAVENOUS | Status: DC
  Administered 2015-10-29: 0.2 ug/kg/h via INTRAVENOUS
  Administered 2015-10-29 – 2015-10-30 (×3): 0.7 ug/kg/h via INTRAVENOUS
  Administered 2015-10-30: 0.3 ug/kg/h via INTRAVENOUS
  Filled 2015-10-29 (×5): qty 50
  Filled 2015-10-29: qty 100
  Filled 2015-10-29: qty 50

## 2015-10-29 NOTE — Progress Notes (Signed)
[place] Progress Note Patient Name: Jeff Miles DOB: 1951-01-24 MRN: [id number]   Date of Service  10/29/2015  HPI/Events of Note  Asked by eICU nurses to camera in on Jeff Miles who is here with alcohol withdrawal. On camera check he has tachycardia, diaphoresis, tachypnea. Case discussed with Dr. Marin Comment, his attending physician and discussed current strategy.  Dr. Marin Comment concerned about extending precedex beyond 24 hours but I am concerned about the patient's current condition and lack of response to ativan and its long term effects.  Per Lexi-Comp (Up To Date: Manufacturer recommends duration of infusion should not exceed 24 hours; however, randomized clinical trials have demonstrated efficacy and safety comparable to lorazepam and midazolam with longer-term infusions of up to ~5 days (Pandharipande 2007; Riker 2009). After our conversation we agreed to the following (see below):  eICU Interventions  D/c ativan drip D/C CIWA protocol Maintain MVI, thiamine Start precedex infusion per [facility] ICU alcohol withdrawal order set Discussed with bedside nurse Continue to monitor     Intervention Category Major Interventions: Change in mental status - evaluation and management  Simonne Maffucci 10/29/2015, 3:50 PM

## 2015-10-29 NOTE — Progress Notes (Signed)
[facility] PROGRESS NOTE  HART BART [id number] DOB: [birth year]    PCP:   No PCP Per Patient   HPI: Jeff Miles is an 65 y.o. male with poor medical care prior to admission, significant alcohol abuse, admitted for COPD exacerbation on IV steroids, nebs, and antibiotics, went into severe withdrawal requiring IV Precedex.  He was also found to have severe HTN and diastolic CHF.   Rewiew of Systems:  Constitutional: Negative for malaise, fever and chills. No significant weight loss or weight gain Eyes: Negative for eye pain, redness and discharge, diplopia, visual changes, or flashes of light. ENMT: Negative for ear pain, hoarseness, nasal congestion, sinus pressure and sore throat. No headaches; tinnitus, drooling, or problem swallowing. Cardiovascular: Negative for chest pain, palpitations, diaphoresis, dyspnea and peripheral edema. ; No orthopnea, PND Respiratory: Negative for cough, hemoptysis, wheezing and stridor. No pleuritic chestpain. Gastrointestinal: Negative for nausea, vomiting, diarrhea, constipation, abdominal pain, melena, blood in stool, hematemesis, jaundice and rectal bleeding.    Genitourinary: Negative for frequency, dysuria, incontinence,flank pain and hematuria; Musculoskeletal: Negative for back pain and neck pain. Negative for swelling and trauma.;  Skin: . Negative for pruritus, rash, abrasions, bruising and skin lesion.; ulcerations Neuro: Negative for headache, lightheadedness and neck stiffness. Negative for weakness, altered level of consciousness , altered mental status, extremity weakness, burning feet, involuntary movement, seizure and syncope.  Psych: negative for anxiety, depression, insomnia, tearfulness, panic attacks, hallucinations, paranoia, suicidal or homicidal ideation    Past Medical History  Diagnosis Date  . COPD (chronic obstructive pulmonary disease) ([place])   . Hypertension     Past Surgical History  Procedure  Laterality Date  . Back surgery    . Nose surgery    . Inner ear surgery      Medications:  HOME MEDS: Prior to Admission medications   Medication Sig Start Date End Date Taking? Authorizing Provider  acetaminophen (TYLENOL) 500 MG tablet Take 500 mg by mouth every 6 (six) hours as needed for mild pain or moderate pain.   Yes Historical Provider, MD  azithromycin (ZITHROMAX) 250 MG tablet Take 250-500 mg by mouth See admin instructions. Take two tablets by mouth on day 1 then take one tablet on days 2 through 5 starting on 10/23/2015   Yes Historical Provider, MD  dextromethorphan-guaiFENesin (MUCINEX DM) 30-600 MG 12hr tablet Take 1 tablet by mouth 2 (two) times daily as needed for cough.   Yes Historical Provider, MD  guaiFENesin-dextromethorphan (ROBITUSSIN DM) 100-10 MG/5ML syrup Take 10 mLs by mouth every 4 (four) hours as needed for cough.   Yes Historical Provider, MD  oxymetazoline (AFRIN) 0.05 % nasal spray Place 2 sprays into both nostrils 2 (two) times daily as needed for congestion.   Yes Historical Provider, MD  Phenyleph-CPM-DM-Aspirin (ALKA-SELTZER PLUS COLD & COUGH) 7.04-14-09-325 MG TBEF Take 1 tablet by mouth daily as needed (for cold and congestion).   Yes Historical Provider, MD  predniSONE (DELTASONE) 20 MG tablet Take 20 mg by mouth See admin instructions. Starting on 10/23/15 take 3 tablets daily for 4 days, then take 1 tablet daily for 3 days   Yes Historical Provider, MD     Allergies:  No Known Allergies  Social History:   reports that he has been smoking.  He does not have any smokeless tobacco history on file. He reports that he drinks alcohol. He reports that he does not use illicit drugs.  Family History: History reviewed. No pertinent family history.   Physical Exam:  Filed Vitals:   10/29/15 0400 10/29/15 0500 10/29/15 0746 10/29/15 0757  BP: 164/97 161/106    Pulse:      Temp: 98 F (36.7 C)   97.6 F (36.4 C)  TempSrc: Axillary   Axillary  Resp: 25  28    Height:      Weight:  93.7 kg (206 lb 9.1 oz)    SpO2:   98%    Blood pressure 161/106, pulse 49, temperature 97.6 F (36.4 C), temperature source Axillary, resp. rate 28, height 5\' 9"  (1.753 m), weight 93.7 kg (206 lb 9.1 oz), SpO2 98 %.  GEN:  Pleasant  patient lying in the stretcher in no acute distress; cooperative with exam. PSYCH:  alert and oriented x4; does not appear anxious or depressed; affect is appropriate. HEENT: Mucous membranes pink and anicteric; PERRLA; EOM intact; no cervical lymphadenopathy nor thyromegaly or carotid bruit; no JVD; There were no stridor. Neck is very supple. Breasts:: Not examined CHEST WALL: No tenderness CHEST: Normal respiration, clear to auscultation bilaterally.  HEART: Regular rate and rhythm.  There are no murmur, rub, or gallops.   BACK: No kyphosis or scoliosis; no CVA tenderness ABDOMEN: soft and non-tender; no masses, no organomegaly, normal abdominal bowel sounds; no pannus; no intertriginous candida. There is no rebound and no distention. Rectal Exam: Not done EXTREMITIES: No bone or joint deformity; age-appropriate arthropathy of the hands and knees; no edema; no ulcerations.  There is no calf tenderness. Genitalia: not examined PULSES: 2+ and symmetric SKIN: Normal hydration no rash or ulceration CNS: Cranial nerves 2-12 grossly intact no focal lateralizing neurologic deficit.  Speech is fluent; uvula elevated with phonation, facial symmetry and tongue midline. DTR are normal bilaterally, cerebella exam is intact, barbinski is negative and strengths are equaled bilaterally.  No sensory loss.   Labs on Admission:  Basic Metabolic Panel:  Recent Labs Lab 10/24/15 0907 10/25/15 0435 10/26/15 0450 10/27/15 0453 10/28/15 0656  NA 138 136 138 142 143  K 4.2 4.2 3.8 4.2 3.7  CL 97* 97* 98* 99* 101  CO2 31 28 32 34* 30  GLUCOSE 134* 161* 115* 139* 134*  BUN 10 18 18 20  22*  CREATININE 1.07 1.14 0.97 1.13 1.00  CALCIUM 9.6 9.3  9.3 9.4 9.7   Liver Function Tests:  Recent Labs Lab 10/25/15 0435  AST 40  ALT 50  ALKPHOS 70  BILITOT 0.4  PROT 6.7  ALBUMIN 3.7   CBC:  Recent Labs Lab 10/24/15 0907 10/25/15 0435 10/26/15 0450 10/27/15 0453 10/28/15 0656  WBC 8.4 7.3 10.1 8.9 10.5  HGB 17.5* 16.3 15.6 15.4 17.7*  HCT 51.7 49.2 48.3 48.5 53.6*  MCV 102.8* 102.7* 105.2* 105.4* 103.3*  PLT 133* 146* 114* 129* 120*   Cardiac Enzymes:  Recent Labs Lab 10/24/15 0907  TROPONINI 0.03    CBG:  Recent Labs Lab 10/29/15 0452  GLUCAP 135*     PLAN:   Severe alcohol withdrawals:  Will d/c Precedex, continue with IV Ativan, high dose if required. -Wife reports beer intake of 24-36 a day.   Hypertensive urgency -It improved, worsens when his CIWA scores are elevated. -Given his diastolic CHF as evidenced on 2-D echo, was started on an ACE inhibitor. Second option would be a beta blocker if blood pressure not well controlled on ACE inhibitor. -Receiving PRN hydralazine as well.  Acute diastolic CHF -2-D echo with ejection fraction of 50-55% and grade 1 diastolic dysfunction. -He is 3.3 L negative since admission. -  Decrease lasix to 20 mg IV daily.  Alcohol abuse -Continue thiamine/folate -Currently undergoing withdrawals, see above for details.  COPD with acute exacerbation -Resolved. S/p Levaqquin.  -Continue steroid titration.  Acute hypoxemic respiratory failure -Improved, continue oxygen as required. -Due to COPD with exacerbation as well as severe alcohol withdrawals.  Code Status: Full code Disposition Plan: Keep in ICU today.   Orvan Falconer, MD.  FACP [facility] Pager [phone] 7pm to 7am.  10/29/2015, 9:34 AM

## 2015-10-29 NOTE — Care Management Note (Signed)
Case Management Note  Patient Details  Name: JAMELE SCHWEDE MRN: [id number] Date of Birth: [birth year]  Subjective/Objective:                  Pt admitted with COPD/CHF. Pt is from home and is continues experiencing ETOH withdrawal. Pt on Precedex and unable to complete assessment. No family at bedside.  DC plan undetermined at this time.   Action/Plan: Will cont to follow for DC planning.   Expected Discharge Date:      11/03/2015            Expected Discharge Plan:     In-House Referral:  NA  Discharge planning Services  CM Consult  Post Acute Care Choice:  NA Choice offered to:  NA  DME Arranged:    DME Agency:     HH Arranged:    HH Agency:     Status of Service:  In process, will continue to follow  Medicare Important Message Given:    Date Medicare IM Given:    Medicare IM give by:    Date Additional Medicare IM Given:    Additional Medicare Important Message give by:     If discussed at [place] of Stay Meetings, dates discussed:    Additional Comments:  Sherald Barge, RN 10/29/2015, 4:31 PM

## 2015-10-30 MED ORDER — LORAZEPAM 2 MG/ML IJ SOLN
4.0000 mg | INTRAMUSCULAR | Status: DC
Start: 2015-10-30 — End: 2015-10-31
  Administered 2015-10-30 – 2015-10-31 (×7): 4 mg via INTRAVENOUS
  Filled 2015-10-30 (×7): qty 2

## 2015-10-30 NOTE — Progress Notes (Signed)
[facility] PROGRESS NOTE  [name] [id number] DOB: 1950/10/24    PCP:   No PCP Per Patient   HPI:  Jeff Miles is an 65 y.o. male with poor medical care prior to admission, significant alcohol abuse, admitted for COPD exacerbation on IV steroids, nebs, and antibiotics, went into severe withdrawal requiring IV Precedex. He was also found to have severe HTN and diastolic CHF. Attempted to wean Precedex was not sucessful yesterday.  We will continue with try weaning, as it is recommended for shorter term use, with tachyarrythmia, and difficult to wean with longer term use of IV continuous Precedex  (See Dr Anastasia Pall note)  I suspect we will need longer acting benzodiazepines given his severe alcoholic withdrawal.    Rewiew of Systems:   Past Medical History  Diagnosis Date  . COPD (chronic obstructive pulmonary disease) ([place])   . Hypertension     Past Surgical History  Procedure Laterality Date  . Back surgery    . Nose surgery    . Inner ear surgery      Medications:  HOME MEDS: Prior to Admission medications   Medication Sig Start Date End Date Taking? Authorizing Provider  acetaminophen (TYLENOL) 500 MG tablet Take 500 mg by mouth every 6 (six) hours as needed for mild pain or moderate pain.   Yes Historical Provider, MD  azithromycin (ZITHROMAX) 250 MG tablet Take 250-500 mg by mouth See admin instructions. Take two tablets by mouth on day 1 then take one tablet on days 2 through 5 starting on 10/23/2015   Yes Historical Provider, MD  dextromethorphan-guaiFENesin (MUCINEX DM) 30-600 MG 12hr tablet Take 1 tablet by mouth 2 (two) times daily as needed for cough.   Yes Historical Provider, MD  guaiFENesin-dextromethorphan (ROBITUSSIN DM) 100-10 MG/5ML syrup Take 10 mLs by mouth every 4 (four) hours as needed for cough.   Yes Historical Provider, MD  oxymetazoline (AFRIN) 0.05 % nasal spray Place 2 sprays into both nostrils 2 (two) times daily as needed for  congestion.   Yes Historical Provider, MD  Phenyleph-CPM-DM-Aspirin (ALKA-SELTZER PLUS COLD & COUGH) 7.04-14-09-325 MG TBEF Take 1 tablet by mouth daily as needed (for cold and congestion).   Yes Historical Provider, MD  predniSONE (DELTASONE) 20 MG tablet Take 20 mg by mouth See admin instructions. Starting on 10/23/15 take 3 tablets daily for 4 days, then take 1 tablet daily for 3 days   Yes Historical Provider, MD     Allergies:  No Known Allergies  Social History:   reports that he has been smoking.  He does not have any smokeless tobacco history on file. He reports that he drinks alcohol. He reports that he does not use illicit drugs.  Family History: History reviewed. No pertinent family history.   Physical Exam: Filed Vitals:   10/30/15 0500 10/30/15 0600 10/30/15 0700 10/30/15 0741  BP: 118/76 122/78 121/81   Pulse: 73 72 70   Temp:      TempSrc:      Resp: 34 23 26   Height:      Weight: 92.7 kg (204 lb 5.9 oz)     SpO2: 96% 97% 96% 97%   Blood pressure 121/81, pulse 70, temperature 98.8 F (37.1 C), temperature source Axillary, resp. rate 26, height 5\' 9"  (1.753 m), weight 92.7 kg (204 lb 5.9 oz), SpO2 97 %.  GEN:  Pleasant patient lying in the stretcher in no acute distress; cooperative with exam. PSYCH:  alert and oriented x4;  does not appear anxious or depressed; affect is appropriate. HEENT: Mucous membranes pink and anicteric; PERRLA; EOM intact; no cervical lymphadenopathy nor thyromegaly or carotid bruit; no JVD; There were no stridor. Neck is very supple. Breasts:: Not examined CHEST WALL: No tenderness CHEST: Normal respiration, clear to auscultation bilaterally.  HEART: Regular rate and rhythm.  There are no murmur, rub, or gallops.   BACK: No kyphosis or scoliosis; no CVA tenderness ABDOMEN: soft and non-tender; no masses, no organomegaly, normal abdominal bowel sounds; no pannus; no intertriginous candida. There is no rebound and no distention. Rectal Exam:  Not done EXTREMITIES: No bone or joint deformity; age-appropriate arthropathy of the hands and knees; no edema; no ulcerations.  There is no calf tenderness. Genitalia: not examined PULSES: 2+ and symmetric SKIN: Normal hydration no rash or ulceration CNS: sedated.   Labs on Admission:  Basic Metabolic Panel:  Recent Labs Lab 10/24/15 0907 10/25/15 0435 10/26/15 0450 10/27/15 0453 10/28/15 0656  NA 138 136 138 142 143  K 4.2 4.2 3.8 4.2 3.7  CL 97* 97* 98* 99* 101  CO2 31 28 32 34* 30  GLUCOSE 134* 161* 115* 139* 134*  BUN 10 18 18 20  22*  CREATININE 1.07 1.14 0.97 1.13 1.00  CALCIUM 9.6 9.3 9.3 9.4 9.7   Liver Function Tests:  Recent Labs Lab 10/25/15 0435  AST 40  ALT 50  ALKPHOS 70  BILITOT 0.4  PROT 6.7  ALBUMIN 3.7   CBC:  Recent Labs Lab 10/24/15 0907 10/25/15 0435 10/26/15 0450 10/27/15 0453 10/28/15 0656  WBC 8.4 7.3 10.1 8.9 10.5  HGB 17.5* 16.3 15.6 15.4 17.7*  HCT 51.7 49.2 48.3 48.5 53.6*  MCV 102.8* 102.7* 105.2* 105.4* 103.3*  PLT 133* 146* 114* 129* 120*   Cardiac Enzymes:  Recent Labs Lab 10/24/15 0907  TROPONINI 0.03    CBG:  Recent Labs Lab 10/29/15 0452  GLUCAP 135*    Assessment/Plan  PLAN: Severe alcohol withdrawals: Will continue attempt to wean Precedex.  Likely will require Ativan, or even Librium.  Will give 4mg  IV Q4 hours RTC.   -Wife reports beer intake of 24-36 a day.   Hypertensive urgency -It improved, worsens when his CIWA scores are elevated. -Given his diastolic CHF as evidenced on 2-D echo, was started on an ACE inhibitor. Second option would be a beta blocker if blood pressure not well controlled on ACE inhibitor. -Receiving PRN hydralazine as well.  Acute diastolic CHF -2-D echo with ejection fraction of 50-55% and grade 1 diastolic dysfunction. -He is 3.3 L negative since admission. -Decrease lasix to 20 mg IV daily.  Alcohol abuse -Continue thiamine/folate -Currently undergoing  withdrawals, see above for details.  COPD with acute exacerbation -Resolved. S/p Levaqquin.  Can d/c steroid now.   Acute hypoxemic respiratory failure -Improved, continue oxygen as required. -Due to COPD with exacerbation as well as severe alcohol withdrawals.  Code Status: Full code Disposition Plan: Keep in ICU today.   Orvan Falconer, MD.  FACP [facility] Pager [phone] 7pm to 7am.  10/30/2015, 8:28 AM

## 2015-10-31 LAB — CREATININE, SERUM: CREATININE: 0.95 mg/dL (ref 0.61–1.24)

## 2015-10-31 MED ORDER — DEXMEDETOMIDINE HCL IN NACL 200 MCG/50ML IV SOLN
0.4000 ug/kg/h | INTRAVENOUS | Status: DC
  Administered 2015-10-31: 0.5 ug/kg/h via INTRAVENOUS
  Administered 2015-10-31 – 2015-11-01 (×3): 0.4 ug/kg/h via INTRAVENOUS
  Filled 2015-10-31 (×2): qty 50

## 2015-10-31 MED ORDER — LORAZEPAM 2 MG/ML IJ SOLN
2.0000 mg | INTRAMUSCULAR | Status: DC
  Administered 2015-10-31 – 2015-11-01 (×3): 2 mg via INTRAVENOUS
  Filled 2015-10-31 (×4): qty 1

## 2015-10-31 NOTE — Care Management Important Message (Deleted)
Important Message  Patient Details  Name: Jeff Miles MRN: [id number] Date of Birth: 1951/08/17   Medicare Important Message Given:       Alvie Heidelberg, RN 10/31/2015, 6:09 PM

## 2015-10-31 NOTE — Progress Notes (Signed)
[facility] PROGRESS NOTE  Jeff Miles [id number] DOB: [birth year]    PCP:   No PCP Per Patient   HPI: Jeff Miles is an 65 y.o. male with poor medical care prior to admission, significant alcohol abuse, admitted for COPD exacerbation on IV steroids, nebs, and antibiotics, went into severe withdrawal requiring IV Precedex. He was also found to have severe HTN and diastolic CHF. Attempted to wean Precedex was not sucessful even after several days.After requiring several days of Precedex, he was eventually able to go off, with the use of scheduled IV Ativan.  This is being tapered as well.   He did have hypotension, and did respond to IVF.   Rewiew of Systems: Unable.   Past Medical History  Diagnosis Date  . COPD (chronic obstructive pulmonary disease) ([place])   . Hypertension     Past Surgical History  Procedure Laterality Date  . Back surgery    . Nose surgery    . Inner ear surgery      Medications:  HOME MEDS: Prior to Admission medications   Medication Sig Start Date End Date Taking? Authorizing Provider  acetaminophen (TYLENOL) 500 MG tablet Take 500 mg by mouth every 6 (six) hours as needed for mild pain or moderate pain.   Yes Historical Provider, MD  azithromycin (ZITHROMAX) 250 MG tablet Take 250-500 mg by mouth See admin instructions. Take two tablets by mouth on day 1 then take one tablet on days 2 through 5 starting on 10/23/2015   Yes Historical Provider, MD  dextromethorphan-guaiFENesin (MUCINEX DM) 30-600 MG 12hr tablet Take 1 tablet by mouth 2 (two) times daily as needed for cough.   Yes Historical Provider, MD  guaiFENesin-dextromethorphan (ROBITUSSIN DM) 100-10 MG/5ML syrup Take 10 mLs by mouth every 4 (four) hours as needed for cough.   Yes Historical Provider, MD  oxymetazoline (AFRIN) 0.05 % nasal spray Place 2 sprays into both nostrils 2 (two) times daily as needed for congestion.   Yes Historical Provider, MD  Phenyleph-CPM-DM-Aspirin  (ALKA-SELTZER PLUS COLD & COUGH) 7.04-14-09-325 MG TBEF Take 1 tablet by mouth daily as needed (for cold and congestion).   Yes Historical Provider, MD  predniSONE (DELTASONE) 20 MG tablet Take 20 mg by mouth See admin instructions. Starting on 10/23/15 take 3 tablets daily for 4 days, then take 1 tablet daily for 3 days   Yes Historical Provider, MD     Allergies:  No Known Allergies  Social History:   reports that he has been smoking.  He does not have any smokeless tobacco history on file. He reports that he drinks alcohol. He reports that he does not use illicit drugs.  Family History: History reviewed. No pertinent family history.   Physical Exam: Filed Vitals:   10/31/15 1400 10/31/15 1430 10/31/15 1500 10/31/15 1600  BP: 85/58 96/63 76/54  113/68  Pulse: 79 80 76 86  Temp:      TempSrc:      Resp: 26 24 21 26   Height:      Weight:      SpO2: 95% 93% 92% 93%   Blood pressure 113/68, pulse 86, temperature 97.4 F (36.3 C), temperature source Oral, resp. rate 26, height 5\' 9"  (1.753 m), weight 92.7 kg (204 lb 5.9 oz), SpO2 93 %.  GEN:  Sedated.  HEENT: Mucous membranes pink and anicteric; PERRLA; EOM intact; no cervical lymphadenopathy nor thyromegaly or carotid bruit; no JVD; There were no stridor. Neck is very supple. Breasts:: Not examined CHEST  WALL: No tenderness CHEST: Normal respiration, clear to auscultation bilaterally.  HEART: Regular rate and rhythm.  There are no murmur, rub, or gallops.   BACK: No kyphosis or scoliosis; no CVA tenderness ABDOMEN: soft and non-tender; no masses, no organomegaly, normal abdominal bowel sounds; no pannus; no intertriginous candida. There is no rebound and no distention. Rectal Exam: Not done EXTREMITIES: No bone or joint deformity; age-appropriate arthropathy of the hands and knees; no edema; no ulcerations.  There is no calf tenderness. Genitalia: not examined PULSES: 2+ and symmetric SKIN: Normal hydration no rash or  ulceration CNS: sedated.  Moves all 4.    Labs on Admission:  Basic Metabolic Panel:  Recent Labs Lab 10/25/15 0435 10/26/15 0450 10/27/15 0453 10/28/15 0656 10/31/15 0431  NA 136 138 142 143  --   K 4.2 3.8 4.2 3.7  --   CL 97* 98* 99* 101  --   CO2 28 32 34* 30  --   GLUCOSE 161* 115* 139* 134*  --   BUN 18 18 20  22*  --   CREATININE 1.14 0.97 1.13 1.00 0.95  CALCIUM 9.3 9.3 9.4 9.7  --    Liver Function Tests:  Recent Labs Lab 10/25/15 0435  AST 40  ALT 50  ALKPHOS 70  BILITOT 0.4  PROT 6.7  ALBUMIN 3.7   CBC:  Recent Labs Lab 10/25/15 0435 10/26/15 0450 10/27/15 0453 10/28/15 0656  WBC 7.3 10.1 8.9 10.5  HGB 16.3 15.6 15.4 17.7*  HCT 49.2 48.3 48.5 53.6*  MCV 102.7* 105.2* 105.4* 103.3*  PLT 146* 114* 129* 120*   CBG:  Recent Labs Lab 10/29/15 0452  GLUCAP 135*    A/P:    Severe alcohol withdrawals:   Precedex is finally off, replaced with IV Ativan, being tapered slowly.  -Wife reports beer intake of 24-36 a day.   Hypertensive urgency -It improved, worsens when his CIWA scores are elevated. -Given his diastolic CHF as evidenced on 2-D echo, was started on an ACE inhibitor. Second option would be a beta blocker if blood pressure not well controlled on ACE inhibitor. -Receiving PRN hydralazine as well.  Acute diastolic CHF -2-D echo with ejection fraction of 50-55% and grade 1 diastolic dysfunction. -He is 3.3 L negative since admission. - He was hypotensive, requiring some IVF and Lasix D/C.   Alcohol abuse -Continue thiamine/folate -Currently undergoing withdrawals, see above for details.  COPD with acute exacerbation -Resolved. S/p Levaqquin.  Can d/c steroid now.   Acute hypoxemic respiratory failure -Improved, continue oxygen as required. -Due to COPD with exacerbation as well as severe alcohol withdrawals.  Code Status: Full code Disposition Plan: Keep in ICU today.   Orvan Falconer, MD.  FACP [facility] Pager  [phone] 7pm to 7am.  10/31/2015, 5:20 PM

## 2015-11-01 DIAGNOSIS — F10232 Alcohol dependence with withdrawal with perceptual disturbance: Secondary | ICD-10-CM

## 2015-11-01 MED ORDER — HEPARIN SODIUM (PORCINE) 5000 UNIT/ML IJ SOLN
5000.0000 [IU] | Freq: Three times a day (TID) | INTRAMUSCULAR | Status: DC
  Administered 2015-11-01 – 2015-11-11 (×26): 5000 [IU] via SUBCUTANEOUS
  Filled 2015-11-01 (×25): qty 1

## 2015-11-01 MED ORDER — LORAZEPAM 2 MG/ML IJ SOLN
INTRAMUSCULAR | Status: AC
  Filled 2015-11-01: qty 3

## 2015-11-01 MED ORDER — LORAZEPAM 2 MG/ML IJ SOLN
1.0000 mg/h | INTRAMUSCULAR | Status: DC
  Administered 2015-11-01: 2 mg/h via INTRAVENOUS
  Administered 2015-11-01: 7 mg/h via INTRAVENOUS
  Administered 2015-11-02: 10 mg/h via INTRAVENOUS
  Filled 2015-11-01: qty 25

## 2015-11-01 NOTE — Progress Notes (Signed)
[facility] PROGRESS NOTE  JULIAS SHANKLIN [id number] DOB: [birth year]    PCP:   No PCP Per Patient   HPI:  Jeff Miles is an 65 y.o. male with poor medical care prior to admission, significant alcohol abuse, admitted for COPD exacerbation on IV steroids, nebs, and antibiotics, went into severe withdrawal requiring IV Precedex. He was also found to have severe HTN and diastolic CHF. Attempted to wean Precedex was not sucessful even after several days. We likely have to convert to Ativan drip.  He did have hypotension, and did respond to IVF.    Rewiew of Systems: Unable.   Past Medical History  Diagnosis Date  . COPD (chronic obstructive pulmonary disease) ([place])   . Hypertension     Past Surgical History  Procedure Laterality Date  . Back surgery    . Nose surgery    . Inner ear surgery      Medications:  HOME MEDS: Prior to Admission medications   Medication Sig Start Date End Date Taking? Authorizing Provider  acetaminophen (TYLENOL) 500 MG tablet Take 500 mg by mouth every 6 (six) hours as needed for mild pain or moderate pain.   Yes Historical Provider, MD  azithromycin (ZITHROMAX) 250 MG tablet Take 250-500 mg by mouth See admin instructions. Take two tablets by mouth on day 1 then take one tablet on days 2 through 5 starting on 10/23/2015   Yes Historical Provider, MD  dextromethorphan-guaiFENesin (MUCINEX DM) 30-600 MG 12hr tablet Take 1 tablet by mouth 2 (two) times daily as needed for cough.   Yes Historical Provider, MD  guaiFENesin-dextromethorphan (ROBITUSSIN DM) 100-10 MG/5ML syrup Take 10 mLs by mouth every 4 (four) hours as needed for cough.   Yes Historical Provider, MD  oxymetazoline (AFRIN) 0.05 % nasal spray Place 2 sprays into both nostrils 2 (two) times daily as needed for congestion.   Yes Historical Provider, MD  Phenyleph-CPM-DM-Aspirin (ALKA-SELTZER PLUS COLD & COUGH) 7.04-14-09-325 MG TBEF Take 1 tablet by mouth daily as needed (for cold  and congestion).   Yes Historical Provider, MD  predniSONE (DELTASONE) 20 MG tablet Take 20 mg by mouth See admin instructions. Starting on 10/23/15 take 3 tablets daily for 4 days, then take 1 tablet daily for 3 days   Yes Historical Provider, MD     Allergies:  No Known Allergies  Social History:   reports that he has been smoking.  He does not have any smokeless tobacco history on file. He reports that he drinks alcohol. He reports that he does not use illicit drugs.  Family History: History reviewed. No pertinent family history.   Physical Exam: Filed Vitals:   11/01/15 0615 11/01/15 0630 11/01/15 0645 11/01/15 0738  BP: 86/62 90/46 84/54    Pulse: 73 73 71   Temp:      TempSrc:      Resp: 23 22 20    Height:      Weight:      SpO2: 95% 96% 95% 95%   Blood pressure 84/54, pulse 71, temperature 97.5 F (36.4 C), temperature source Oral, resp. rate 20, height 5\' 9"  (1.753 m), weight 92.7 kg (204 lb 5.9 oz), SpO2 95 %.  GEN: sedated.  HEENT: Mucous membranes pink and anicteric; PERRLA; EOM intact; no cervical lymphadenopathy nor thyromegaly or carotid bruit; no JVD; There were no stridor. Neck is very supple. Breasts:: Not examined CHEST WALL: No tenderness CHEST: Normal respiration, clear to auscultation bilaterally.  HEART: Regular rate and rhythm.  There  are no murmur, rub, or gallops.   BACK: No kyphosis or scoliosis; no CVA tenderness ABDOMEN: soft and non-tender; no masses, no organomegaly, normal abdominal bowel sounds; no pannus; no intertriginous candida. There is no rebound and no distention. Rectal Exam: Not done EXTREMITIES: No bone or joint deformity; age-appropriate arthropathy of the hands and knees; no edema; no ulcerations.  There is no calf tenderness. Genitalia: not examined PULSES: 2+ and symmetric SKIN: Normal hydration no rash or ulceration CNS: Cranial nerves 2-12 grossly intact no focal lateralizing neurologic deficit.  Speech is fluent; uvula elevated  with phonation, facial symmetry and tongue midline. DTR are normal bilaterally, cerebella exam is intact, barbinski is negative and strengths are equaled bilaterally.  No sensory loss.   Labs on Admission:  Basic Metabolic Panel:  Recent Labs Lab 10/26/15 0450 10/27/15 0453 10/28/15 0656 10/31/15 0431  NA 138 142 143  --   K 3.8 4.2 3.7  --   CL 98* 99* 101  --   CO2 32 34* 30  --   GLUCOSE 115* 139* 134*  --   BUN 18 20 22*  --   CREATININE 0.97 1.13 1.00 0.95  CALCIUM 9.3 9.4 9.7  --    CBC:  Recent Labs Lab 10/26/15 0450 10/27/15 0453 10/28/15 0656  WBC 10.1 8.9 10.5  HGB 15.6 15.4 17.7*  HCT 48.3 48.5 53.6*  MCV 105.2* 105.4* 103.3*  PLT 114* 129* 120*   CBG:  Recent Labs Lab 10/29/15 0452  GLUCAP 135*   Assessment/Plan  Hypertensive urgency -It improved, worsens when his CIWA scores are elevated. -Given his diastolic CHF as evidenced on 2-D echo, Lisinopril was started, but he was hypotensive, so will d/c it. Second option would be a beta blocker if blood pressure not well controlled on ACE inhibitor. -Receiving PRN hydralazine as well.  Acute diastolic CHF -2-D echo with ejection fraction of 50-55% and grade 1 diastolic dysfunction. -He is 3.3 L negative since admission. - He was hypotensive, requiring some IVF and Lasix D/C.   Alcohol abuse -Continue thiamine/folate -Currently undergoing withdrawals, see above for details. -Will change Precedex to IV Ativan drip.  -If not able to eat, will consider NGT feeding.   COPD with acute exacerbation -Resolved. S/p Levaqquin.  Can d/c steroid now.   Acute hypoxemic respiratory failure -Improved, continue oxygen as required. -Due to COPD with exacerbation as well as severe alcohol withdrawals.  Code Status: Full code Disposition Plan: Keep in ICU today.   Orvan Falconer, MD.  FACP [facility] Pager [phone] 7pm to 7am.  11/01/2015, 8:26 AM

## 2015-11-02 LAB — BLOOD GAS, ARTERIAL
ACID-BASE EXCESS: 2.1 mmol/L — AB (ref 0.0–2.0)
BICARBONATE: 24.9 meq/L — AB (ref 20.0–24.0)
DRAWN BY: 105551
FIO2: 100
O2 Saturation: 94 %
PO2 ART: 76.6 mmHg — AB (ref 80.0–100.0)
pCO2 arterial: 53.9 mmHg — ABNORMAL HIGH (ref 35.0–45.0)
pH, Arterial: 7.328 — ABNORMAL LOW (ref 7.350–7.450)

## 2015-11-02 MED ORDER — LORAZEPAM 2 MG/ML IJ SOLN
2.0000 mg | INTRAMUSCULAR | Status: DC | PRN
  Administered 2015-11-03 – 2015-11-05 (×3): 2 mg via INTRAVENOUS
  Filled 2015-11-02 (×2): qty 1

## 2015-11-02 MED ORDER — JEVITY 1.2 CAL PO LIQD
1000.0000 mL | ORAL | Status: DC
  Filled 2015-11-02 (×2): qty 1000

## 2015-11-02 MED ORDER — LORAZEPAM 2 MG/ML IJ SOLN
INTRAMUSCULAR | Status: AC
  Filled 2015-11-02: qty 3

## 2015-11-02 MED ORDER — FUROSEMIDE 10 MG/ML IJ SOLN
20.0000 mg | Freq: Two times a day (BID) | INTRAMUSCULAR | Status: DC
  Administered 2015-11-02 – 2015-11-04 (×4): 20 mg via INTRAVENOUS
  Filled 2015-11-02 (×4): qty 2

## 2015-11-02 NOTE — Progress Notes (Signed)
Patient has been very confused and may have aspirated, Respirations are 40-50, on NRB mask , have NTSX patient passed in to lungs obtain only clear thin , white secretions , no stomach contents.noted.  Gave patient a nebulizer treatment with xopenex . And place on BiPAP 14/6 f 10 70%.. Blood gas was obtained before BiPAP .  7,328, 53.9, 76.6 this was on NRB mask. . Patient appears to be improving.

## 2015-11-02 NOTE — Progress Notes (Signed)
[facility] PROGRESS NOTE  HILLEL LAITINEN [id number] DOB: [birth year]    PCP:   No PCP Per Patient   HPI:  Jeff Miles is an 65 y.o. male with poor medical care prior to admission, significant alcohol abuse, admitted for COPD exacerbation on IV steroids, nebs, and antibiotics, went into severe withdrawal requiring IV Precedex. He was also found to have severe HTN and diastolic CHF. Attempted to wean Precedex was not sucessful even after several days. Eventually, he was given IV ativan, and tapered.  He had respiratory distress, and may have aspirated.  He could also have CHF, as his lasix was discontinued due to prior hypotension.    Rewiew of Systems:  Unable.   Past Medical History  Diagnosis Date  . COPD (chronic obstructive pulmonary disease) ([place])   . Hypertension     Past Surgical History  Procedure Laterality Date  . Back surgery    . Nose surgery    . Inner ear surgery      Medications:  HOME MEDS: Prior to Admission medications   Medication Sig Start Date End Date Taking? Authorizing Provider  acetaminophen (TYLENOL) 500 MG tablet Take 500 mg by mouth every 6 (six) hours as needed for mild pain or moderate pain.   Yes Historical Provider, MD  azithromycin (ZITHROMAX) 250 MG tablet Take 250-500 mg by mouth See admin instructions. Take two tablets by mouth on day 1 then take one tablet on days 2 through 5 starting on 10/23/2015   Yes Historical Provider, MD  dextromethorphan-guaiFENesin (MUCINEX DM) 30-600 MG 12hr tablet Take 1 tablet by mouth 2 (two) times daily as needed for cough.   Yes Historical Provider, MD  guaiFENesin-dextromethorphan (ROBITUSSIN DM) 100-10 MG/5ML syrup Take 10 mLs by mouth every 4 (four) hours as needed for cough.   Yes Historical Provider, MD  oxymetazoline (AFRIN) 0.05 % nasal spray Place 2 sprays into both nostrils 2 (two) times daily as needed for congestion.   Yes Historical Provider, MD  Phenyleph-CPM-DM-Aspirin  (ALKA-SELTZER PLUS COLD & COUGH) 7.04-14-09-325 MG TBEF Take 1 tablet by mouth daily as needed (for cold and congestion).   Yes Historical Provider, MD  predniSONE (DELTASONE) 20 MG tablet Take 20 mg by mouth See admin instructions. Starting on 10/23/15 take 3 tablets daily for 4 days, then take 1 tablet daily for 3 days   Yes Historical Provider, MD     Allergies:  No Known Allergies  Social History:   reports that he has been smoking.  He does not have any smokeless tobacco history on file. He reports that he drinks alcohol. He reports that he does not use illicit drugs.  Family History: History reviewed. No pertinent family history.   Physical Exam: Filed Vitals:   11/02/15 0702 11/02/15 0703 11/02/15 0800 11/02/15 0900  BP:   151/95 132/115  Pulse: 118  119 118  Temp:   99.1 F (37.3 C)   TempSrc:   Axillary   Resp: 36  31 18  Height:      Weight:      SpO2: 99% 92% 99% 100%   Blood pressure 132/115, pulse 118, temperature 99.1 F (37.3 C), temperature source Axillary, resp. rate 18, height 5\' 9"  (1.753 m), weight 90.8 kg (200 lb 2.8 oz), SpO2 100 %.  GEN:  Pleasant  patient lying in the stretcher in no acute distress; cooperative with exam. PSYCH:  alert and oriented x4; does not appear anxious or depressed; affect is appropriate. HEENT: Mucous membranes  pink and anicteric; PERRLA; EOM intact; no cervical lymphadenopathy nor thyromegaly or carotid bruit; no JVD; There were no stridor. Neck is very supple. Breasts:: Not examined CHEST WALL: No tenderness CHEST: Normal respiration, some wheezing.  HEART: Regular rate and rhythm.  There are no murmur, rub, or gallops.   BACK: No kyphosis or scoliosis; no CVA tenderness ABDOMEN: soft and non-tender; no masses, no organomegaly, normal abdominal bowel sounds; no pannus; no intertriginous candida. There is no rebound and no distention. Rectal Exam: Not done EXTREMITIES: No bone or joint deformity; age-appropriate arthropathy of the  hands and knees; no edema; no ulcerations.  There is no calf tenderness. Genitalia: not examined PULSES: 2+ and symmetric SKIN: Normal hydration no rash or ulceration CNS: Moves all 4.   Labs on Admission:  Basic Metabolic Panel:  Recent Labs Lab 10/27/15 0453 10/28/15 0656 10/31/15 0431  NA 142 143  --   K 4.2 3.7  --   CL 99* 101  --   CO2 34* 30  --   GLUCOSE 139* 134*  --   BUN 20 22*  --   CREATININE 1.13 1.00 0.95  CALCIUM 9.4 9.7  --    CBC:  Recent Labs Lab 10/27/15 0453 10/28/15 0656  WBC 8.9 10.5  HGB 15.4 17.7*  HCT 48.5 53.6*  MCV 105.4* 103.3*  PLT 129* 120*    CBG:  Recent Labs Lab 10/29/15 0452  GLUCAP 135*    Hypertensive urgency -It improved, worsens when his CIWA scores are elevated. -Given his diastolic CHF as evidenced on 2-D echo, Lisinopril was started, and held due to hypotension, but will resume now. Second option would be a beta blocker if blood pressure not well controlled on ACE inhibitor. -Receiving PRN hydralazine as well.  Acute diastolic CHF -2-D echo with ejection fraction of 50-55% and grade 1 diastolic dysfunction. -He is 3.3 L negative since admission. - He was hypotensive, requiring some IVF and Lasix D/C. But we will resume today. Will check labs today as well.   Alcohol abuse -Continue thiamine/folate -Currently undergoing withdrawals, see above for details. Will d/c Ativan.  Use PRN IV.  -If not able to eat, will consider NGT feeding.   COPD with acute exacerbation -Resolved. S/p Levaqquin.  Can d/c steroid now.   Acute hypoxemic respiratory failure -Improved, continue oxygen as required. -Due to COPD with exacerbation as well as severe alcohol withdrawals.  Code Status: Full code Disposition Plan: Keep in ICU today.  Orvan Falconer, MD.  FACP [facility] Pager [phone] 7pm to 7am.  11/02/2015, 10:21 AM

## 2015-11-03 ENCOUNTER — Inpatient Hospital Stay (HOSPITAL_COMMUNITY): Payer: BLUE CROSS/BLUE SHIELD

## 2015-11-03 LAB — COMPREHENSIVE METABOLIC PANEL
ALT: 34 U/L (ref 17–63)
AST: 20 U/L (ref 15–41)
Albumin: 3.4 g/dL — ABNORMAL LOW (ref 3.5–5.0)
Alkaline Phosphatase: 74 U/L (ref 38–126)
Anion gap: 11 (ref 5–15)
BUN: 15 mg/dL (ref 6–20)
CHLORIDE: 107 mmol/L (ref 101–111)
CO2: 29 mmol/L (ref 22–32)
CREATININE: 0.79 mg/dL (ref 0.61–1.24)
Calcium: 9.4 mg/dL (ref 8.9–10.3)
Glucose, Bld: 125 mg/dL — ABNORMAL HIGH (ref 65–99)
POTASSIUM: 3.7 mmol/L (ref 3.5–5.1)
SODIUM: 147 mmol/L — AB (ref 135–145)
Total Bilirubin: 0.9 mg/dL (ref 0.3–1.2)
Total Protein: 6.8 g/dL (ref 6.5–8.1)

## 2015-11-03 LAB — CBC WITH DIFFERENTIAL/PLATELET
Basophils Absolute: 0 10*3/uL (ref 0.0–0.1)
Basophils Relative: 0 %
EOS ABS: 0.1 10*3/uL (ref 0.0–0.7)
Eosinophils Relative: 1 %
HEMATOCRIT: 51.6 % (ref 39.0–52.0)
HEMOGLOBIN: 17.1 g/dL — AB (ref 13.0–17.0)
LYMPHS ABS: 1.5 10*3/uL (ref 0.7–4.0)
LYMPHS PCT: 12 %
MCH: 34.7 pg — AB (ref 26.0–34.0)
MCHC: 33.1 g/dL (ref 30.0–36.0)
MCV: 104.7 fL — AB (ref 78.0–100.0)
MONOS PCT: 10 %
Monocytes Absolute: 1.2 10*3/uL — ABNORMAL HIGH (ref 0.1–1.0)
NEUTROS PCT: 77 %
Neutro Abs: 9.5 10*3/uL — ABNORMAL HIGH (ref 1.7–7.7)
Platelets: 182 10*3/uL (ref 150–400)
RBC: 4.93 MIL/uL (ref 4.22–5.81)
RDW: 12.6 % (ref 11.5–15.5)
WBC: 12.3 10*3/uL — AB (ref 4.0–10.5)

## 2015-11-03 LAB — VITAMIN B12: VITAMIN B 12: 1146 pg/mL — AB (ref 180–914)

## 2015-11-03 NOTE — Progress Notes (Signed)
Have taken patient off BiPAP as he has started to awaken and is coughing slid down flat in bed. Placed on 3lpm hr 126 f 26 Saturation 92 he still is confused.

## 2015-11-03 NOTE — Progress Notes (Signed)
[facility] PROGRESS NOTE  COMMIE FENDLEY [id number] DOB: Jun 17, 1951    PCP:   No PCP Per Patient   HPI:  Jeff Miles is an 65 y.o. male with poor medical care prior to admission, significant alcohol abuse, admitted for COPD exacerbation on IV steroids, nebs, and antibiotics, went into severe withdrawal requiring IV Precedex. He was also found to have severe HTN and diastolic CHF. Attempted to wean Precedex was not sucessful even after several days. Eventually, he was given IV ativan, and tapered. He had respiratory distress, and may have aspirated. He could also have CHF, as his lasix was discontinued due to prior hypotension. His Lasix was placed back and he has improved. He is a little more alert today.  He recognized his wife today.   Rewiew of Systems: Unable.   Past Medical History  Diagnosis Date  . COPD (chronic obstructive pulmonary disease) ([place])   . Hypertension     Past Surgical History  Procedure Laterality Date  . Back surgery    . Nose surgery    . Inner ear surgery      Medications:  HOME MEDS: Prior to Admission medications   Medication Sig Start Date End Date Taking? Authorizing Provider  acetaminophen (TYLENOL) 500 MG tablet Take 500 mg by mouth every 6 (six) hours as needed for mild pain or moderate pain.   Yes Historical Provider, MD  azithromycin (ZITHROMAX) 250 MG tablet Take 250-500 mg by mouth See admin instructions. Take two tablets by mouth on day 1 then take one tablet on days 2 through 5 starting on 10/23/2015   Yes Historical Provider, MD  dextromethorphan-guaiFENesin (MUCINEX DM) 30-600 MG 12hr tablet Take 1 tablet by mouth 2 (two) times daily as needed for cough.   Yes Historical Provider, MD  guaiFENesin-dextromethorphan (ROBITUSSIN DM) 100-10 MG/5ML syrup Take 10 mLs by mouth every 4 (four) hours as needed for cough.   Yes Historical Provider, MD  oxymetazoline (AFRIN) 0.05 % nasal spray Place 2 sprays into both nostrils 2  (two) times daily as needed for congestion.   Yes Historical Provider, MD  Phenyleph-CPM-DM-Aspirin (ALKA-SELTZER PLUS COLD & COUGH) 7.04-14-09-325 MG TBEF Take 1 tablet by mouth daily as needed (for cold and congestion).   Yes Historical Provider, MD  predniSONE (DELTASONE) 20 MG tablet Take 20 mg by mouth See admin instructions. Starting on 10/23/15 take 3 tablets daily for 4 days, then take 1 tablet daily for 3 days   Yes Historical Provider, MD     Allergies:  No Known Allergies  Social History:   reports that he has been smoking.  He does not have any smokeless tobacco history on file. He reports that he drinks alcohol. He reports that he does not use illicit drugs.  Family History: History reviewed. No pertinent family history.   Physical Exam: Filed Vitals:   11/03/15 0400 11/03/15 0500 11/03/15 0600 11/03/15 0756  BP: 152/124 160/90 165/89   Pulse: 117 117 117   Temp: 99.2 F (37.3 C)   98.4 F (36.9 C)  TempSrc: Axillary   Axillary  Resp: 32 32 33   Height:      Weight:  88 kg (194 lb 0.1 oz)    SpO2: 95% 94% 94%    Blood pressure 165/89, pulse 117, temperature 98.4 F (36.9 C), temperature source Axillary, resp. rate 33, height 5\' 9"  (1.753 m), weight 88 kg (194 lb 0.1 oz), SpO2 94 %.  GEN:  Pleasant  patient lying in the  stretcher in no acute distress; cooperative with exam. PSYCH:  alert and oriented x4; does not appear anxious or depressed; affect is appropriate. HEENT: Mucous membranes pink and anicteric; PERRLA; EOM intact; no cervical lymphadenopathy nor thyromegaly or carotid bruit; no JVD; There were no stridor. Neck is very supple. Breasts:: Not examined CHEST WALL: No tenderness CHEST: Normal respiration, clear to auscultation bilaterally.  HEART: Regular rate and rhythm.  There are no murmur, rub, or gallops.   BACK: No kyphosis or scoliosis; no CVA tenderness ABDOMEN: soft and non-tender; no masses, no organomegaly, normal abdominal bowel sounds; no pannus;  no intertriginous candida. There is no rebound and no distention. Rectal Exam: Not done EXTREMITIES: No bone or joint deformity; age-appropriate arthropathy of the hands and knees; no edema; no ulcerations.  There is no calf tenderness. Genitalia: not examined PULSES: 2+ and symmetric SKIN: Normal hydration no rash or ulceration CNS: moves all 4 extremities.  Facial symmetry with grimaces.  Recognized his wife.    Labs on Admission:  Basic Metabolic Panel:  Recent Labs Lab 10/28/15 0656 10/31/15 0431 11/03/15 0418  NA 143  --  147*  K 3.7  --  3.7  CL 101  --  107  CO2 30  --  29  GLUCOSE 134*  --  125*  BUN 22*  --  15  CREATININE 1.00 0.95 0.79  CALCIUM 9.7  --  9.4   Liver Function Tests:  Recent Labs Lab 11/03/15 0418  AST 20  ALT 34  ALKPHOS 74  BILITOT 0.9  PROT 6.8  ALBUMIN 3.4*   CBC:  Recent Labs Lab 10/28/15 0656 11/03/15 0418  WBC 10.5 12.3*  NEUTROABS  --  9.5*  HGB 17.7* 17.1*  HCT 53.6* 51.6  MCV 103.3* 104.7*  PLT 120* 182   CBG:  Recent Labs Lab 10/29/15 0452  GLUCAP 135*   Hypertensive urgency -It improved, worsens when his CIWA scores are elevated. -Given his diastolic CHF as evidenced on 2-D echo, Lisinopril was started, and held due to hypotension, but will resume now. Second option would be a beta blocker if blood pressure not well controlled on ACE inhibitor. -Receiving PRN hydralazine as well.  Acute diastolic CHF -2-D echo with ejection fraction of 50-55% and grade 1 diastolic dysfunction. -He is 3.3 L negative since admission. - He was hypotensive, requiring some IVF and Lasix D/C. But we will resume today. Will check labs today as well.   Alcohol abuse -Continue thiamine/folate.  B12 level pending. -Currently undergoing withdrawals, see above for details. Will d/c Ativan. Use PRN IV.  -If not able to eat, will consider NGT feeding.   COPD with acute exacerbation -Resolved. S/p Levaqquin.  Can d/c steroid now.    Acute hypoxemic respiratory failure -Improved, continue oxygen as required. -Due to COPD with exacerbation as well as severe alcohol withdrawals.  Code Status: Full code Disposition Plan: Keep in ICU today.   Orvan Falconer, MD.  FACP [facility] Pager [phone] 7pm to 7am.  11/03/2015, 8:22 AM

## 2015-11-04 LAB — COMPREHENSIVE METABOLIC PANEL
ALBUMIN: 3.4 g/dL — AB (ref 3.5–5.0)
ALT: 30 U/L (ref 17–63)
AST: 19 U/L (ref 15–41)
Alkaline Phosphatase: 75 U/L (ref 38–126)
Anion gap: 12 (ref 5–15)
BILIRUBIN TOTAL: 0.9 mg/dL (ref 0.3–1.2)
BUN: 22 mg/dL — AB (ref 6–20)
CO2: 32 mmol/L (ref 22–32)
Calcium: 9.9 mg/dL (ref 8.9–10.3)
Chloride: 107 mmol/L (ref 101–111)
Creatinine, Ser: 0.91 mg/dL (ref 0.61–1.24)
GFR calc Af Amer: >60 mL/min (ref >60)
GFR calc non Af Amer: >60 mL/min (ref >60)
GLUCOSE: 125 mg/dL — AB (ref 65–99)
POTASSIUM: 3.8 mmol/L (ref 3.5–5.1)
SODIUM: 151 mmol/L — AB (ref 135–145)
TOTAL PROTEIN: 7.1 g/dL (ref 6.5–8.1)

## 2015-11-04 LAB — CBC WITH DIFFERENTIAL/PLATELET
BASOS ABS: 0 10*3/uL (ref 0.0–0.1)
Basophils Relative: 0 %
EOS PCT: 1 %
Eosinophils Absolute: 0.1 10*3/uL (ref 0.0–0.7)
HEMATOCRIT: 55.1 % — AB (ref 39.0–52.0)
Hemoglobin: 17.9 g/dL — ABNORMAL HIGH (ref 13.0–17.0)
LYMPHS ABS: 1.5 10*3/uL (ref 0.7–4.0)
LYMPHS PCT: 15 %
MCH: 34.3 pg — AB (ref 26.0–34.0)
MCHC: 32.5 g/dL (ref 30.0–36.0)
MCV: 105.6 fL — AB (ref 78.0–100.0)
MONO ABS: 0.8 10*3/uL (ref 0.1–1.0)
MONOS PCT: 8 %
NEUTROS ABS: 7.8 10*3/uL — AB (ref 1.7–7.7)
Neutrophils Relative %: 76 %
PLATELETS: 180 10*3/uL (ref 150–400)
RBC: 5.22 MIL/uL (ref 4.22–5.81)
RDW: 12.5 % (ref 11.5–15.5)
WBC: 10.2 10*3/uL (ref 4.0–10.5)

## 2015-11-04 MED ORDER — LEVALBUTEROL HCL 0.63 MG/3ML IN NEBU
0.6300 mg | INHALATION_SOLUTION | Freq: Two times a day (BID) | RESPIRATORY_TRACT | Status: DC
  Administered 2015-11-04 – 2015-11-11 (×13): 0.63 mg via RESPIRATORY_TRACT
  Filled 2015-11-04 (×15): qty 3

## 2015-11-04 MED ORDER — FUROSEMIDE 40 MG PO TABS
40.0000 mg | ORAL_TABLET | Freq: Every day | ORAL | Status: DC
  Administered 2015-11-06 – 2015-11-11 (×4): 40 mg via ORAL
  Filled 2015-11-04 (×6): qty 1

## 2015-11-04 MED ORDER — DEXTROSE-NACL 5-0.45 % IV SOLN
INTRAVENOUS | Status: DC
  Administered 2015-11-04 – 2015-11-05 (×2): via INTRAVENOUS

## 2015-11-04 NOTE — Evaluation (Signed)
Physical Therapy Evaluation Patient Details Name: Jeff Miles MRN: [id number] DOB: 1950/12/24 Today's Date: 11/04/2015   History of Present Illness  Patient is a 65 year old man who has not had any medical care in the past 20 years who presents to the hospital on 10/24/15  with a 3 month history of worsening shortness of breath or lower extremity edema. He visited an urgent care yesterday and was told he probably had pneumonia and advised him to come to the emergency department today. Denies any fevers or chills, states that the shortness of breath is worse with exertion, he smokes a pack and half a day and has done so for over 20 years, states he drinks about 6 beers a day, for the past 7 days he has had a runny nose and congestion as well as a nonproductive cough. In the emergency department he was found to be markedly hypertensive with an initial blood pressure of 205/134. Labs were essentially unremarkable with the exception of slight hemoconcentration with a hemoglobin of 17.5 chest x-ray showed mild chronic kidney changes but no acute findings. Initial troponin was normal at 0.03 and EKG was normal with the exception of sinus tachycardia with a rate of 111. Avonex for further evaluation and management.  Clinical Impression  Pt lethargic but awake and following commands at evaluation.  Pt fatigues quickly,(after minimal exercises and rolling to both sides x 5 reps.)  Pt will need skilled physical therapy at discharge to allow pt to reach maximum functional ability.     Follow Up Recommendations SNF    Equipment Recommendations  None recommended by PT    Recommendations for Other Services OT consult;Speech consult     Precautions / Restrictions Precautions Precautions: Fall Restrictions Weight Bearing Restrictions: No      Mobility  Bed Mobility Overal bed mobility: Needs Assistance Bed Mobility: Rolling Rolling: Mod assist         General bed mobility comments: to roll  to rt and to left   Transfers    not tested at this time                             Pertinent Vitals/Pain Pain Assessment: No/denies pain    Home Living Family/patient expects to be discharged to:: Private residence Living Arrangements: Spouse/significant other Available Help at Discharge: Family;Available 24 hours/day Type of Home: House Home Access: Level entry     Home Layout: One level Home Equipment: None      Prior Function Level of Independence: Independent                  Extremity/Trunk Assessment               Lower Extremity Assessment: RLE deficits/detail;LLE deficits/detail RLE Deficits / Details: hip mm generally 2/5; knee 3-/5 LLE Deficits / Details: hip mm generally 2/5; knee 3-/5     Communication   Communication: HOH  Cognition Arousal/Alertness: Lethargic Behavior During Therapy: WFL for tasks assessed/performed Overall Cognitive Status: Within Functional Limits for tasks assessed                         Exercises General Exercises - Lower Extremity Ankle Circles/Pumps: AAROM;Both;5 reps Heel Slides: AAROM;Both;5 reps Hip ABduction/ADduction: AAROM;Both;5 reps      Assessment/Plan    PT Assessment Patient needs continued PT services  PT Diagnosis Difficulty walking;Generalized weakness   PT Problem List Decreased strength;Decreased activity  tolerance;Decreased mobility;Cardiopulmonary status limiting activity  PT Treatment Interventions Gait training;Functional mobility training;Therapeutic activities;Therapeutic exercise   PT Goals (Current goals can be found in the Care Plan section) Acute Rehab PT Goals Patient Stated Goal: none verbaized Time For Goal Achievement: 11/11/15 Potential to Achieve Goals: Good    Frequency Min 3X/week   Barriers to discharge        Co-evaluation               End of Session   Activity Tolerance: Patient limited by lethargy;Patient limited by  fatigue Patient left: in bed;with call bell/phone within reach;with family/visitor present           Time: [id number] PT Time Calculation (min) (ACUTE ONLY): 34 min   Charges:   PT Evaluation $PT Eval Moderate Complexity: 1 Procedure     PT G CodesRayetta Humphrey, PT CLT [phone] 11/04/2015, 1:50 PM

## 2015-11-04 NOTE — Progress Notes (Signed)
PT Cancellation Note  Patient Details Name: Jeff Miles MRN: [id number] DOB: September 23, 1950   Cancelled Treatment:    Reason Eval/Treat Not Completed: Patient's level of consciousness.  Unable to arouse.  Will try again tomorrow.   Demetrios Isaacs L  PT 11/04/2015, 10:23 AM

## 2015-11-04 NOTE — Progress Notes (Signed)
Initial Nutrition Assessment  DOCUMENTATION CODES:  Not applicable  INTERVENTION:   If PO intake not anticipated in next 24 hours recommend NGT placement with initiation of TF, This would be mark 10 days since pt had substantial PO intake. Only source of calories has been D5 which was only just started today.   May consider swallow evaluation to determine safety/ability to tolerate PO intake   NUTRITION DIAGNOSIS:  Inadequate oral intake related to lethargy/confusion as evidenced by Almost no intake in nearly 10 days.  GOAL:  Patient will meet greater than or equal to 90% of their needs  MONITOR:  PO intake, Labs, I & O's, Weight trends  REASON FOR ASSESSMENT:  Low Braden    ASSESSMENT:  65 y/o male PMHx significant for COPD, HTN. Had not seen medical provider in >20 years. Initially presented with 3 month hx of SOB and LEE. Reported significant etoh and tobacco abuse.  Admitted for significant HTN, developed chf later. Hospital course complicated by significant alcohol withdrawal requiring sedation with precedex for ~5 days. May have aspirated on secretions 2/19.  Per EMR documentation: Pt has had 1 meal in the last 8 days largely due to being consistently sedated to control severe alcohol withdrawal.   On RD arrival, no family present.   RD seeing patient for first time today. He is awake and slightly agitated. He gave semi-appropriate responses to questions such as stating he is hungry and mentioning a food he would like.   Pt's current wt is 192 lbs. Admitted at 200 lbs (unsure if reported or measured) he had been >210 lbs early in his admission and while likely the vast majority of this loss is likely fluid from CHF/diuretics, likely he has lost actual body weight given >1week no po intake. Overall He is 8.3 liters negative since admission (~18 lbs)  NFPE: No apparent muscle/fat wasting  Labs reviewed: Hypernatremic, Hyperglycemic, hypoalbuminemia.   Diet Order:  Diet  Heart Room service appropriate?: Yes; Fluid consistency:: Thin  Skin: Rash on back, red/dry   Last BM:  2/18  Height:  Ht Readings from Last 1 Encounters:  10/24/15 5\' 9"  (1.753 m)   Weight:  Wt Readings from Last 1 Encounters:  11/04/15 192 lb 3.9 oz (87.2 kg)   Ideal Body Weight:  72.7 kg  BMI:  Body mass index is 28.38 kg/(m^2).  Estimated Nutritional Needs:  Kcal:  1850-2100 (21-24 kcal/kg bw) Protein:  87-102 g (1-1.2 g/kg ibw) Fluid:  PER MD  EDUCATION NEEDS:  No education needs identified at this time  Burtis Junes RD, LDN Nutrition Pager: [phone] 11/04/2015 1:52 PM

## 2015-11-04 NOTE — Progress Notes (Signed)
[facility] PROGRESS NOTE  [name] [id number] DOB: [birth year]    PCP:   No PCP Per Patient   HPI:  Jeff Miles is an 65 y.o. male with poor medical care prior to admission, significant alcohol abuse, admitted for COPD exacerbation on IV steroids, nebs, and antibiotics, went into severe withdrawal requiring IV Precedex. He was also found to have severe HTN and diastolic CHF. Attempted to wean Precedex was not sucessful even after several days. Eventually, he was given IV ativan, and tapered. He had respiratory distress, and may have aspirated. He could also have CHF, as his lasix was discontinued due to prior hypotension. His Lasix was placed back and he has improved. He is a little more alert today. He is improving slowly each day.   Rewiew of Systems: Unable.   Past Medical History  Diagnosis Date  . COPD (chronic obstructive pulmonary disease) ([place])   . Hypertension     Past Surgical History  Procedure Laterality Date  . Back surgery    . Nose surgery    . Inner ear surgery      Medications:  HOME MEDS: Prior to Admission medications   Medication Sig Start Date End Date Taking? Authorizing Provider  acetaminophen (TYLENOL) 500 MG tablet Take 500 mg by mouth every 6 (six) hours as needed for mild pain or moderate pain.   Yes Historical Provider, MD  azithromycin (ZITHROMAX) 250 MG tablet Take 250-500 mg by mouth See admin instructions. Take two tablets by mouth on day 1 then take one tablet on days 2 through 5 starting on 10/23/2015   Yes Historical Provider, MD  dextromethorphan-guaiFENesin (MUCINEX DM) 30-600 MG 12hr tablet Take 1 tablet by mouth 2 (two) times daily as needed for cough.   Yes Historical Provider, MD  guaiFENesin-dextromethorphan (ROBITUSSIN DM) 100-10 MG/5ML syrup Take 10 mLs by mouth every 4 (four) hours as needed for cough.   Yes Historical Provider, MD  oxymetazoline (AFRIN) 0.05 % nasal spray Place 2 sprays into both nostrils 2  (two) times daily as needed for congestion.   Yes Historical Provider, MD  Phenyleph-CPM-DM-Aspirin (ALKA-SELTZER PLUS COLD & COUGH) 7.04-14-09-325 MG TBEF Take 1 tablet by mouth daily as needed (for cold and congestion).   Yes Historical Provider, MD  predniSONE (DELTASONE) 20 MG tablet Take 20 mg by mouth See admin instructions. Starting on 10/23/15 take 3 tablets daily for 4 days, then take 1 tablet daily for 3 days   Yes Historical Provider, MD     Allergies:  No Known Allergies  Social History:   reports that he has been smoking.  He does not have any smokeless tobacco history on file. He reports that he drinks alcohol. He reports that he does not use illicit drugs.  Family History: History reviewed. No pertinent family history.   Physical Exam: Filed Vitals:   11/04/15 0700 11/04/15 0755 11/04/15 0800 11/04/15 0900  BP: 177/106  182/116 120/53  Pulse: 131  109 109  Temp:  97.7 F (36.5 C)    TempSrc:  Axillary    Resp: 21  30 26   Height:      Weight:      SpO2: 90%  90% 90%   Blood pressure 120/53, pulse 109, temperature 97.7 F (36.5 C), temperature source Axillary, resp. rate 26, height 5\' 9"  (1.753 m), weight 87.2 kg (192 lb 3.9 oz), SpO2 90 %.  GEN:  Pleasant  patient lying in the stretcher in no acute distress; cooperative with exam.  PSYCH:  Awake, lethargic, confused. does not appear anxious or depressed; affect is appropriate. HEENT: Mucous membranes pink and anicteric; PERRLA; EOM intact; no cervical lymphadenopathy nor thyromegaly or carotid bruit; no JVD; There were no stridor. Neck is very supple. Breasts:: Not examined CHEST WALL: No tenderness CHEST: Normal respiration, clear to auscultation bilaterally.  HEART: Regular rate and rhythm.  There are no murmur, rub, or gallops.   BACK: No kyphosis or scoliosis; no CVA tenderness ABDOMEN: soft and non-tender; no masses, no organomegaly, normal abdominal bowel sounds; no pannus; no intertriginous candida. There is no  rebound and no distention. Rectal Exam: Not done EXTREMITIES: No bone or joint deformity; age-appropriate arthropathy of the hands and knees; no edema; no ulcerations.  There is no calf tenderness. Genitalia: not examined PULSES: 2+ and symmetric SKIN: Normal hydration no rash or ulceration CNS: Cranial nerves 2-12 grossly intact no focal lateralizing neurologic deficit.  Speech is fluent; uvula elevated with phonation, facial symmetry and tongue midline. DTR are normal bilaterally, cerebella exam is intact, barbinski is negative and strengths are equaled bilaterally.  No sensory loss.   Labs on Admission:  Basic Metabolic Panel:  Recent Labs Lab 10/31/15 0431 11/03/15 0418 11/04/15 0412  NA  --  147* 151*  K  --  3.7 3.8  CL  --  107 107  CO2  --  29 32  GLUCOSE  --  125* 125*  BUN  --  15 22*  CREATININE 0.95 0.79 0.91  CALCIUM  --  9.4 9.9   Liver Function Tests:  Recent Labs Lab 11/03/15 0418 11/04/15 0412  AST 20 19  ALT 34 30  ALKPHOS 74 75  BILITOT 0.9 0.9  PROT 6.8 7.1  ALBUMIN 3.4* 3.4*   CBC:  Recent Labs Lab 11/03/15 0418 11/04/15 0412  WBC 12.3* 10.2  NEUTROABS 9.5* 7.8*  HGB 17.1* 17.9*  HCT 51.6 55.1*  MCV 104.7* 105.6*  PLT 182 180    CBG:  Recent Labs Lab 10/29/15 0452  GLUCAP 135*    Radiological Exams on Admission: Dg Chest Port 1 View  11/03/2015  CLINICAL DATA:  Congestion EXAM: PORTABLE CHEST 1 VIEW COMPARISON:  10/25/2015 FINDINGS: The heart size and mediastinal contours are within normal limits. Both lungs are clear. The visualized skeletal structures are unremarkable. IMPRESSION: No active disease. Electronically Signed   By: Rolm Baptise M.D.   On: 11/03/2015 12:53    Assessment/Plan  Hypertensive urgency -It improved, worsens when his CIWA scores are elevated. -Given his diastolic CHF as evidenced on 2-D echo, Lisinopril was started, held for hypotension, and resumed.  -Receiving PRN hydralazine as well.  Acute  diastolic CHF -2-D echo with ejection fraction of 50-55% and grade 1 diastolic dysfunction. - He has been placed back on IV Lasix.   Alcohol abuse -Continue thiamine/folate.B12 level not low.  -Currently undergoing withdrawals, see above for details. -Was able to be off ativan and Precedex drip now.  Use PRN IV.  -If not able to eat, will consider NGT feeding.   COPD with acute exacerbation -Resolved. S/p Levaqquin.  Can d/c steroid now.   Acute hypoxemic respiratory failure -Improved, continue oxygen as required. -Due to COPD with exacerbation as well as severe alcohol withdrawals. Other plans as per orders.  Code Status: FULL Haskel Khan, MD.  FACP [facility] Pager [phone] 7pm to 7am.  11/04/2015, 10:45 AM

## 2015-11-04 NOTE — Care Management Note (Signed)
Case Management Note  Patient Details  Name: Jeff Miles MRN: [id number] Date of Birth: 20-Nov-1950   Expected Discharge Date:                  Expected Discharge Plan:  [place]  In-House Referral:  Clinical Social Work  Discharge planning Services  CM Consult  Post Acute Care Choice:  NA Choice offered to:  NA  DME Arranged:    DME Agency:     HH Arranged:    [place] Agency:     Status of Service:  In process, will continue to follow  Medicare Important Message Given:    Date Medicare IM Given:    Medicare IM give by:    Date Additional Medicare IM Given:    Additional Medicare Important Message give by:     If discussed at [place] of Stay Meetings, dates discussed:  11/04/2015  Additional Comments: Pt's AMS continues. PT has recommended SNF at [place]. CSW has been referred and will work with pt/family to make arrangements. Will cont to follow.  Sherald Barge, RN 11/04/2015, 3:50 PM

## 2015-11-05 LAB — COMPREHENSIVE METABOLIC PANEL WITH GFR
ALT: 28 U/L (ref 17–63)
AST: 21 U/L (ref 15–41)
Albumin: 3.3 g/dL — ABNORMAL LOW (ref 3.5–5.0)
Alkaline Phosphatase: 71 U/L (ref 38–126)
Anion gap: 11 (ref 5–15)
BUN: 21 mg/dL — ABNORMAL HIGH (ref 6–20)
CO2: 31 mmol/L (ref 22–32)
Calcium: 9.7 mg/dL (ref 8.9–10.3)
Chloride: 107 mmol/L (ref 101–111)
Creatinine, Ser: 0.91 mg/dL (ref 0.61–1.24)
GFR calc Af Amer: >60 mL/min
GFR calc non Af Amer: >60 mL/min
Glucose, Bld: 156 mg/dL — ABNORMAL HIGH (ref 65–99)
Potassium: 3.6 mmol/L (ref 3.5–5.1)
Sodium: 149 mmol/L — ABNORMAL HIGH (ref 135–145)
Total Bilirubin: 0.8 mg/dL (ref 0.3–1.2)
Total Protein: 6.7 g/dL (ref 6.5–8.1)

## 2015-11-05 LAB — CBC WITH DIFFERENTIAL/PLATELET
Basophils Absolute: 0.1 10*3/uL (ref 0.0–0.1)
Basophils Relative: 1 %
Eosinophils Absolute: 0.2 10*3/uL (ref 0.0–0.7)
Eosinophils Relative: 2 %
HCT: 51.6 % (ref 39.0–52.0)
HEMOGLOBIN: 17.3 g/dL — AB (ref 13.0–17.0)
LYMPHS ABS: 2.1 10*3/uL (ref 0.7–4.0)
LYMPHS PCT: 21 %
MCH: 34.8 pg — AB (ref 26.0–34.0)
MCHC: 33.5 g/dL (ref 30.0–36.0)
MCV: 103.8 fL — AB (ref 78.0–100.0)
Monocytes Absolute: 0.6 10*3/uL (ref 0.1–1.0)
Monocytes Relative: 6 %
NEUTROS PCT: 70 %
Neutro Abs: 7.1 10*3/uL (ref 1.7–7.7)
Platelets: 189 10*3/uL (ref 150–400)
RBC: 4.97 MIL/uL (ref 4.22–5.81)
RDW: 12.4 % (ref 11.5–15.5)
WBC: 10 10*3/uL (ref 4.0–10.5)

## 2015-11-05 MED ORDER — LORAZEPAM 2 MG/ML IJ SOLN
1.0000 mg | INTRAMUSCULAR | Status: DC | PRN
  Filled 2015-11-05: qty 1

## 2015-11-05 MED ORDER — POTASSIUM CL IN DEXTROSE 5% 20 MEQ/L IV SOLN
20.0000 meq | INTRAVENOUS | Status: DC
  Administered 2015-11-05 – 2015-11-06 (×2): 20 meq via INTRAVENOUS

## 2015-11-05 MED ORDER — RESOURCE THICKENUP CLEAR PO POWD
ORAL | Status: DC | PRN
  Filled 2015-11-05: qty 125

## 2015-11-05 NOTE — Progress Notes (Signed)
Physical Therapy Treatment Patient Details Name: Jeff Miles MRN: [id number] DOB: 1951/04/30 Today's Date: 11/05/2015    History of Present Illness Patient is a 65 year old man who has not had any medical care in the past 20 years who presents to the hospital on 10/24/15  with a 3 month history of worsening shortness of breath or lower extremity edema. He visited an urgent care yesterday and was told he probably had pneumonia and advised him to come to the emergency department today. Denies any fevers or chills, states that the shortness of breath is worse with exertion, he smokes a pack and half a day and has done so for over 20 years, states he drinks about 6 beers a day, for the past 7 days he has had a runny nose and congestion as well as a nonproductive cough. In the emergency department he was found to be markedly hypertensive with an initial blood pressure of 205/134. Labs were essentially unremarkable with the exception of slight hemoconcentration with a hemoglobin of 17.5 chest x-ray showed mild chronic kidney changes but no acute findings. Initial troponin was normal at 0.03 and EKG was normal with the exception of sinus tachycardia with a rate of 111. Avonex for further evaluation and management.    PT Comments    Pt found struggling with his restraints and wife present in the room. He was oriented to time only, but eager to get up from his bed with the PT. He was able to follow simple commands with increased time and Max verbal cues. His bed mobility is improved from initial eval, requiring CGA to move supine to sit EOB. He was able to independently maintain sitting for several minutes while performing LE exercises without LOB. Transitions sit to stand requiring ModA with increased posterior lean noted and relying on therapist to maintain upright position. Pt attempted to take 1 sidestep with MaxA, however he was unsuccessful. He will continue to benefit from skilled PT while at [facility] to  address his impairments.   Follow Up Recommendations  SNF     Equipment Recommendations  None recommended by PT    Recommendations for Other Services OT consult;Speech consult     Precautions / Restrictions Precautions Precautions: Fall Restrictions Weight Bearing Restrictions: No    Mobility  Bed Mobility Overal bed mobility: Needs Assistance   Rolling: Min guard            Transfers Overall transfer level: Needs assistance Equipment used: Rolling walker (2 wheeled) Transfers: Sit to/from Stand Sit to Stand: Mod assist         General transfer comment: x2 attempts   Ambulation/Gait Ambulation/Gait assistance:  (attempted to take 1 side step to the L with MaxA from therapist)               Stairs            Wheelchair Mobility    Modified Rankin (Stroke Patients Only)       Balance Overall balance assessment: Needs assistance Sitting-balance support: Bilateral upper extremity supported Sitting balance-Leahy Scale: Good     Standing balance support: Bilateral upper extremity supported Standing balance-Leahy Scale: Poor                      Cognition Arousal/Alertness: Awake/alert Behavior During Therapy: Impulsive (attempting to stand several times during session) Overall Cognitive Status: Impaired/Different from baseline Area of Impairment: Safety/judgement;Orientation Orientation Level: Disoriented to;Place       Safety/Judgement: Decreased awareness of  safety          Exercises General Exercises - Lower Extremity Ankle Circles/Pumps: AAROM;Both;5 reps Long Arc Quad: AAROM;Both;5 reps Hip ABduction/ADduction: AAROM;Both;5 reps    General Comments        Pertinent Vitals/Pain Pain Assessment: No/denies pain    Home Living                      Prior Function            PT Goals (current goals can now be found in the care plan section) Acute Rehab PT Goals Patient Stated Goal: none  verbaized Time For Goal Achievement: 11/18/15 Potential to Achieve Goals: Good Progress towards PT goals: Progressing toward goals    Frequency  Min 3X/week    PT Plan Current plan remains appropriate    Co-evaluation             End of Session Equipment Utilized During Treatment: Gait belt Activity Tolerance: Patient limited by fatigue;Treatment limited secondary to medical complications (Comment) Patient left: in bed;with call bell/phone within reach;with family/visitor present;with nursing/sitter in room;with restraints reapplied     Time: 1330-1353 PT Time Calculation (min) (ACUTE ONLY): 23 min  Charges:  $Therapeutic Exercise: 8-22 mins $Therapeutic Activity: 8-22 mins                    G Codes:        2:09 PM,2015-12-04 Elly Modena PT, DPT [facility] [phone]

## 2015-11-05 NOTE — Evaluation (Signed)
Clinical/Bedside Swallow Evaluation Patient Details  Name: Jeff Miles MRN: [id number] Date of Birth: October 15, 1950  Today's Date: 11/05/2015 Time: SLP Start Time (ACUTE ONLY): 0900 SLP Stop Time (ACUTE ONLY): 0930 SLP Time Calculation (min) (ACUTE ONLY): 30 min  Past Medical History:  Past Medical History  Diagnosis Date  . COPD (chronic obstructive pulmonary disease) ([place])   . Hypertension    Past Surgical History:  Past Surgical History  Procedure Laterality Date  . Back surgery    . Nose surgery    . Inner ear surgery     HPI:  Jeff Miles is an 65 y.o. male with poor medical care prior to admission, significant alcohol abuse, admitted for COPD exacerbation on IV steroids, nebs, and antibiotics, went into severe withdrawal requiring IV Precedex. He was also found to have severe HTN and diastolic CHF. Attempted to wean Precedex was not sucessful even after several days. Eventually, he was given IV ativan, and tapered. He had one episode of respiratory distress, and may have aspirated. He could also have CHF, as his lasix was discontinued due to prior hypotension. His Lasix was placed back and he has improved. He is a little more alert today. He is improving slowly each day. He is now more alert and confabulates quite a bit.    Assessment / Plan / Recommendation Clinical Impression  Mr. Bage was seen for a clinical swallow evaluation due to recent episode of respiratory distress and coughing during meals. Pt is confused, but cooperative. He reports that he has had difficulty swallowing "since last Thursday when I had my heart attack". Pt perseverates on requests and needs firm cues to transition to next task. Pt with strong intermittent cough after drinking thin liquids (cup and straw sips). Pt may be penetrating/aspirating thin liquids, but appears to have a very strong, protective cough. SLP spoke with wife who reports that pt does occasionally get choked on liquids  and some solids. Suspect decrease swallow function at this time related to decreased mentation (poor self awareness, impulsive), lethargy, and COPD (making it difficult to coordinate respiration and swallow). Recommend downgrade to D3 mech soft and nectar-thick liquids until mentation improves. SLP will follow while in acute setting. Above discussed with wife and RN.     Aspiration Risk  Mild aspiration risk    Diet Recommendation Dysphagia 3 (Mech soft);Nectar-thick liquid   Liquid Administration via: Cup;Straw Medication Administration: Whole meds with liquid Supervision: Patient able to self feed;Full supervision/cueing for compensatory strategies Compensations: Minimize environmental distractions;Small sips/bites;Slow rate Postural Changes: Seated upright at 90 degrees;Remain upright for at least 30 minutes after po intake    Other  Recommendations Oral Care Recommendations: Oral care BID;Staff/trained caregiver to provide oral care Other Recommendations: Order thickener from pharmacy;Clarify dietary restrictions   Follow up Recommendations   (pending)    Frequency and Duration min 2x/week  1 week       Prognosis Prognosis for Safe Diet Advancement: Fair Barriers to Reach Goals: Cognitive deficits      Swallow Study   General Date of Onset: 10/24/15 HPI: Jeff Miles is an 65 y.o. male with poor medical care prior to admission, significant alcohol abuse, admitted for COPD exacerbation on IV steroids, nebs, and antibiotics, went into severe withdrawal requiring IV Precedex. He was also found to have severe HTN and diastolic CHF. Attempted to wean Precedex was not sucessful even after several days. Eventually, he was given IV ativan, and tapered. He had one episode of respiratory distress,  and may have aspirated. He could also have CHF, as his lasix was discontinued due to prior hypotension. His Lasix was placed back and he has improved. He is a little more alert today. He  is improving slowly each day. He is now more alert and confabulates quite a bit.  Type of Study: Bedside Swallow Evaluation Previous Swallow Assessment: None on record Diet Prior to this Study: Regular;Thin liquids Temperature Spikes Noted: No Respiratory Status: Nasal cannula History of Recent Intubation: No Behavior/Cognition: Alert;Pleasant mood;Confused;Requires cueing Oral Cavity Assessment: Dry Oral Care Completed by SLP: Yes Oral Cavity - Dentition: Adequate natural dentition Vision: Functional for self-feeding Self-Feeding Abilities: Needs assist Patient Positioning: Upright in bed Baseline Vocal Quality: Normal Volitional Cough: Strong;Congested Volitional Swallow: Able to elicit    Oral/Motor/Sensory Function Overall Oral Motor/Sensory Function: Within functional limits   Ice Chips Ice chips: Within functional limits Presentation: Spoon   Thin Liquid Thin Liquid: Within functional limits Presentation: Cup;Straw    Nectar Thick Nectar Thick Liquid: Within functional limits Presentation: Cup;Spoon   Honey Thick Honey Thick Liquid: Not tested   Puree Puree: Within functional limits Presentation: Spoon   Solid      Solid: Impaired Presentation: Spoon Oral Phase Impairments: Reduced lingual movement/coordination;Poor awareness of bolus Oral Phase Functional Implications: Prolonged oral transit Pharyngeal Phase Impairments: Multiple swallows;Cough - Delayed       Thank you,  Genene Churn, [place]  PORTER,DABNEY 11/05/2015,2:20 PM

## 2015-11-05 NOTE — Progress Notes (Signed)
Called report to [place], Therapist, sports on dept 300. Verbalized understanding. Pt transferred to room 335 in safe and stable condition.

## 2015-11-05 NOTE — Progress Notes (Signed)
[facility] PROGRESS NOTE  Jeff Miles [id number] DOB: 21-Apr-1951    PCP:   No PCP Per Patient   HPI:  HPI: Jeff Miles is an 65 y.o. male with poor medical care prior to admission, significant alcohol abuse, admitted for COPD exacerbation on IV steroids, nebs, and antibiotics, went into severe withdrawal requiring IV Precedex. He was also found to have severe HTN and diastolic CHF. Attempted to wean Precedex was not sucessful even after several days. Eventually, he was given IV ativan, and tapered. He had one episode of respiratory distress, and may have aspirated. He could also have CHF, as his lasix was discontinued due to prior hypotension. His Lasix was placed back and he has improved. He is a little more alert today. He is improving slowly each day. He is now more alert and confabulates quite a bit.   Rewiew of Systems: Unreliable.   Past Medical History  Diagnosis Date  . COPD (chronic obstructive pulmonary disease) ([place])   . Hypertension     Past Surgical History  Procedure Laterality Date  . Back surgery    . Nose surgery    . Inner ear surgery      Medications:  HOME MEDS: Prior to Admission medications   Medication Sig Start Date End Date Taking? Authorizing Provider  acetaminophen (TYLENOL) 500 MG tablet Take 500 mg by mouth every 6 (six) hours as needed for mild pain or moderate pain.   Yes Historical Provider, MD  azithromycin (ZITHROMAX) 250 MG tablet Take 250-500 mg by mouth See admin instructions. Take two tablets by mouth on day 1 then take one tablet on days 2 through 5 starting on 10/23/2015   Yes Historical Provider, MD  dextromethorphan-guaiFENesin (MUCINEX DM) 30-600 MG 12hr tablet Take 1 tablet by mouth 2 (two) times daily as needed for cough.   Yes Historical Provider, MD  guaiFENesin-dextromethorphan (ROBITUSSIN DM) 100-10 MG/5ML syrup Take 10 mLs by mouth every 4 (four) hours as needed for cough.   Yes Historical Provider, MD   oxymetazoline (AFRIN) 0.05 % nasal spray Place 2 sprays into both nostrils 2 (two) times daily as needed for congestion.   Yes Historical Provider, MD  Phenyleph-CPM-DM-Aspirin (ALKA-SELTZER PLUS COLD & COUGH) 7.04-14-09-325 MG TBEF Take 1 tablet by mouth daily as needed (for cold and congestion).   Yes Historical Provider, MD  predniSONE (DELTASONE) 20 MG tablet Take 20 mg by mouth See admin instructions. Starting on 10/23/15 take 3 tablets daily for 4 days, then take 1 tablet daily for 3 days   Yes Historical Provider, MD     Allergies:  No Known Allergies  Social History:   reports that he has been smoking.  He does not have any smokeless tobacco history on file. He reports that he drinks alcohol. He reports that he does not use illicit drugs.  Family History: History reviewed. No pertinent family history.   Physical Exam: Filed Vitals:   11/05/15 0300 11/05/15 0400 11/05/15 0500 11/05/15 0600  BP: 153/73 131/99 159/101 168/106  Pulse: 117 41 149 115  Temp:  98.8 F (37.1 C)    TempSrc:  Oral    Resp: 27 26 17 24   Height:      Weight:      SpO2: 96% 94% 97% 89%   Blood pressure 168/106, pulse 115, temperature 98.8 F (37.1 C), temperature source Oral, resp. rate 24, height 5\' 9"  (1.753 m), weight 87.2 kg (192 lb 3.9 oz), SpO2 89 %.  GEN:  Pleasant  patient lying in the stretcher in no acute distress; cooperative with exam. PSYCH:  alert and confused, does not appear anxious or depressed; affect is appropriate. HEENT: Mucous membranes pink and anicteric; PERRLA; EOM intact; no cervical lymphadenopathy nor thyromegaly or carotid bruit; no JVD; There were no stridor. Neck is very supple. Breasts:: Not examined CHEST WALL: No tenderness CHEST: Normal respiration, clear to auscultation bilaterally.  HEART: Regular rate and rhythm.  There are no murmur, rub, or gallops.   BACK: No kyphosis or scoliosis; no CVA tenderness ABDOMEN: soft and non-tender; no masses, no organomegaly,  normal abdominal bowel sounds; no pannus; no intertriginous candida. There is no rebound and no distention. Rectal Exam: Not done EXTREMITIES: No bone or joint deformity; age-appropriate arthropathy of the hands and knees; no edema; no ulcerations.  There is no calf tenderness. Genitalia: not examined PULSES: 2+ and symmetric SKIN: Normal hydration no rash or ulceration CNS: alert, confused, answered questions.  No sensory loss.   Labs on Admission:  Basic Metabolic Panel:  Recent Labs Lab 10/31/15 0431 11/03/15 0418 11/04/15 0412 11/05/15 0540  NA  --  147* 151* 149*  K  --  3.7 3.8 3.6  CL  --  107 107 107  CO2  --  29 32 31  GLUCOSE  --  125* 125* 156*  BUN  --  15 22* 21*  CREATININE 0.95 0.79 0.91 0.91  CALCIUM  --  9.4 9.9 9.7   Liver Function Tests:  Recent Labs Lab 11/03/15 0418 11/04/15 0412 11/05/15 0540  AST 20 19 21   ALT 34 30 28  ALKPHOS 74 75 71  BILITOT 0.9 0.9 0.8  PROT 6.8 7.1 6.7  ALBUMIN 3.4* 3.4* 3.3*   CBC:  Recent Labs Lab 11/03/15 0418 11/04/15 0412 11/05/15 0540  WBC 12.3* 10.2 10.0  NEUTROABS 9.5* 7.8* 7.1  HGB 17.1* 17.9* 17.3*  HCT 51.6 55.1* 51.6  MCV 104.7* 105.6* 103.8*  PLT 182 180 189    Radiological Exams on Admission: Dg Chest Port 1 View  11/03/2015  CLINICAL DATA:  Congestion EXAM: PORTABLE CHEST 1 VIEW COMPARISON:  10/25/2015 FINDINGS: The heart size and mediastinal contours are within normal limits. Both lungs are clear. The visualized skeletal structures are unremarkable. IMPRESSION: No active disease. Electronically Signed   By: Rolm Baptise M.D.   On: 11/03/2015 12:53    EKG: Independently reviewed.   PLAN: Hypertensive urgency -It improved, worsens when his CIWA scores are elevated. -Given his diastolic CHF as evidenced on 2-D echo, Lisinopril was started, held for hypotension, and resumed.  -Receiving PRN hydralazine as well.  Acute diastolic CHF -2-D echo with ejection fraction of 50-55% and grade 1  diastolic dysfunction. -Now on oral lasix.   Alcohol abuse -Continue thiamine/folate.B12 level not low.  -Currently undergoing withdrawals, see above for details. -Was able to be off ativan and Precedex drip now. Use PRN IV.  -Is on a diet, but we will ask speech to evaluate.  He may have aspirated when more lethargic.   COPD with acute exacerbation -Resolved. S/p Levaqquin.  Can d/c steroid now.   Acute hypoxemic respiratory failure -Improved, continue oxygen as required. -Due to COPD with exacerbation as well as severe alcohol withdrawals. Other plans as per orders.  Code Status: FULL Haskel Khan, MD.  FACP [facility] Pager [phone] 7pm to 7am.  11/05/2015, 7:37 AM

## 2015-11-06 DIAGNOSIS — I5032 Chronic diastolic (congestive) heart failure: Secondary | ICD-10-CM

## 2015-11-06 DIAGNOSIS — J690 Pneumonitis due to inhalation of food and vomit: Secondary | ICD-10-CM

## 2015-11-06 LAB — BASIC METABOLIC PANEL
Anion gap: 12 (ref 5–15)
BUN: 18 mg/dL (ref 6–20)
CHLORIDE: 104 mmol/L (ref 101–111)
CO2: 29 mmol/L (ref 22–32)
CREATININE: 0.94 mg/dL (ref 0.61–1.24)
Calcium: 10 mg/dL (ref 8.9–10.3)
GFR calc Af Amer: >60 mL/min (ref >60)
GLUCOSE: 155 mg/dL — AB (ref 65–99)
Potassium: 4 mmol/L (ref 3.5–5.1)
SODIUM: 145 mmol/L (ref 135–145)

## 2015-11-06 LAB — TSH: TSH: 1.054 u[IU]/mL (ref 0.350–4.500)

## 2015-11-06 LAB — MAGNESIUM: MAGNESIUM: 1.8 mg/dL (ref 1.7–2.4)

## 2015-11-06 MED ORDER — LORAZEPAM 2 MG/ML IJ SOLN
1.0000 mg | Freq: Four times a day (QID) | INTRAMUSCULAR | Status: DC | PRN
  Filled 2015-11-06: qty 1

## 2015-11-06 MED ORDER — CHLORDIAZEPOXIDE HCL 5 MG PO CAPS
10.0000 mg | ORAL_CAPSULE | Freq: Three times a day (TID) | ORAL | Status: DC
  Administered 2015-11-06 – 2015-11-11 (×12): 10 mg via ORAL
  Filled 2015-11-06 (×14): qty 2

## 2015-11-06 MED ORDER — POTASSIUM CL IN DEXTROSE 5% 20 MEQ/L IV SOLN
20.0000 meq | INTRAVENOUS | Status: DC
  Administered 2015-11-06: 20 meq via INTRAVENOUS

## 2015-11-06 NOTE — Progress Notes (Addendum)
Speech Language Pathology Treatment: Dysphagia  Patient Details Name: MAMADOU MINICOZZI MRN: [id number] DOB: 07/30/1951 Today's Date: 11/06/2015 Time: 1630-1700 SLP Time Calculation (min) (ACUTE ONLY): 30 min  Assessment / Plan / Recommendation Clinical Impression  SLP provided skilled dysphagia therapy to assess diet tolerance and to assess readiness for diet upgrade; SLP noted wet quality vocal quality at bedside prior to any trials being provided. Patient required mod to max tactile and verbal cues to become alert to participate in trials although he was very pleasant and cooperative after arousal. Patient consumed cup sips of NTL demonstrating a very strong, overt and immediate cough with trials and despite strong coughing he demonstrated residual wet vocal quality and "rattle" noted during breathing after trials.  Patient with improved tolerance of trials of HTL with no immediate coughing (although he did demonstrate strong, wet delayed coughing) and decreased wet vocal quality indicating improved protection of airway. SLP questions if current mentation (confusion, decreased alertness and continued lethargy) is negatively impacting patient's tolerance (timely swallow trigger) of PO intake. Recommend downgrade to HONEY thick liquids and ST to follow and upgrade as mentation improves and as clinically appropriate. Continue with mechanical soft diet. SLP communication with RN and recommendation noted above pt's bed.   HPI HPI: BOYD MAGSINO is an 65 y.o. male with poor medical care prior to admission, significant alcohol abuse, admitted for COPD exacerbation on IV steroids, nebs, and antibiotics, went into severe withdrawal requiring IV Precedex. He was also found to have severe HTN and diastolic CHF. Attempted to wean Precedex was not sucessful even after several days. Eventually, he was given IV ativan, and tapered. He had one episode of respiratory distress, and may have aspirated. He could  also have CHF, as his lasix was discontinued due to prior hypotension. His Lasix was placed back and he has improved. Pt with increased lethargy and nursing reports pt is either "very alert" or "difficult to arouse".      SLP Plan  Continue with current plan of care     Recommendations  Diet recommendations: Dysphagia 3 (mechanical soft);Honey-thick liquid Liquids provided via: Cup Medication Administration: Whole meds with liquid Compensations: Minimize environmental distractions;Slow rate;Small sips/bites;Effortful swallow;Clear throat intermittently;Multiple dry swallows after each bite/sip Postural Changes and/or Swallow Maneuvers: Seated upright 90 degrees            Oral Care Recommendations: Oral care BID;Staff/trained caregiver to provide oral care Plan: Continue with current plan of care     Amelia H. Roddie Mc, CCC-SLP Speech Language Pathologist               Wende Bushy 11/06/2015, 5:00 PM

## 2015-11-06 NOTE — Progress Notes (Signed)
Patient slept aprox 2hrs between 11pm-6am. Patient continuously tugged at his gown, IV, bed linen, etc while attempting to get OOB w/o assistance. Patient alert to self, disoriented to place, time, situation. Safety belt restraint remains in place at this time w/current MD order in place, snug but not too tight, no injury noted at this time. Patient has had episodes of yelling out, grunting and shaking his bed. Vss. No c/o pain noted at this time.

## 2015-11-06 NOTE — Progress Notes (Signed)
[facility] PROGRESS NOTE  Jeff Miles [id number] DOB: 1950/12/16    PCP:   No PCP Per Patient   HPI:  HPI: Jeff Miles is an 65 y.o. male with poor medical care prior to admission, significant alcohol abuse, admitted for COPD exacerbation on IV steroids, nebs, and antibiotics, went into severe withdrawal requiring IV Precedex. He was also found to have severe HTN and diastolic CHF. Attempted to wean Precedex was not sucessful even after several days. Eventually, he was given IV ativan, and tapered. He had one episode of respiratory distress, and may have aspirated. He could also have CHF, as his lasix was discontinued due to prior hypotensive episode. His Lasix was placed back and he is a slowly improving. He continued to be confused and currently oriented just to person. Overnight require restraints for safety reasons. No fever, no chest pain, no shortness of breath.  Rewiew of Systems: Unreliable.   Past Medical History  Diagnosis Date  . COPD (chronic obstructive pulmonary disease) ([place])   . Hypertension     Past Surgical History  Procedure Laterality Date  . Back surgery    . Nose surgery    . Inner ear surgery      Medications:  HOME MEDS: Prior to Admission medications   Medication Sig Start Date End Date Taking? Authorizing Provider  acetaminophen (TYLENOL) 500 MG tablet Take 500 mg by mouth every 6 (six) hours as needed for mild pain or moderate pain.   Yes Historical Provider, MD  azithromycin (ZITHROMAX) 250 MG tablet Take 250-500 mg by mouth See admin instructions. Take two tablets by mouth on day 1 then take one tablet on days 2 through 5 starting on 10/23/2015   Yes Historical Provider, MD  dextromethorphan-guaiFENesin (MUCINEX DM) 30-600 MG 12hr tablet Take 1 tablet by mouth 2 (two) times daily as needed for cough.   Yes Historical Provider, MD  guaiFENesin-dextromethorphan (ROBITUSSIN DM) 100-10 MG/5ML syrup Take 10 mLs by mouth every 4  (four) hours as needed for cough.   Yes Historical Provider, MD  oxymetazoline (AFRIN) 0.05 % nasal spray Place 2 sprays into both nostrils 2 (two) times daily as needed for congestion.   Yes Historical Provider, MD  Phenyleph-CPM-DM-Aspirin (ALKA-SELTZER PLUS COLD & COUGH) 7.04-14-09-325 MG TBEF Take 1 tablet by mouth daily as needed (for cold and congestion).   Yes Historical Provider, MD  predniSONE (DELTASONE) 20 MG tablet Take 20 mg by mouth See admin instructions. Starting on 10/23/15 take 3 tablets daily for 4 days, then take 1 tablet daily for 3 days   Yes Historical Provider, MD     Allergies:  No Known Allergies  Social History:   reports that he has been smoking.  He does not have any smokeless tobacco history on file. He reports that he drinks alcohol. He reports that he does not use illicit drugs.  Family History: History reviewed. No pertinent family history.   Physical Exam: Filed Vitals:   11/05/15 2055 11/06/15 0441 11/06/15 1537 11/06/15 1546  BP: 170/99 154/99 132/61 146/79  Pulse: 114 120 72 107  Temp: 97.5 F (36.4 C) 98.2 F (36.8 C) 98.3 F (36.8 C) 98.2 F (36.8 C)  TempSrc: Axillary Oral Oral Oral  Resp: 18 20 18 18   Height:      Weight:      SpO2: 96% 92% 97% 96%   Blood pressure 146/79, pulse 107, temperature 98.2 F (36.8 C), temperature source Oral, resp. rate 18, height 5\' 9"  (1.753  m), weight 87.2 kg (192 lb 3.9 oz), SpO2 96 %.  GEN:  Pleasant; sleepy and oriented to person only. In no acute distress; require restraints overnight for safety reasons.  HEENT: Mucous membranes pink and anicteric; PERRLA; EOM intact; no cervical lymphadenopathy nor thyromegaly or carotid bruit; no JVD; There were no stridor. Neck is very supple. No drainage out of his ears or nostrils CHEST: Normal respiration, scattered rhonchi, no wheezing, no crackles HEART: Regular rate and rhythm.  There are no murmur, rub, or gallops.   ABDOMEN: soft and non-tender; no masses, no  organomegaly, normal abdominal bowel sounds; no pannus; no intertriginous candida. There is no rebound and no distention. EXTREMITIES: No bone or joint deformity; age-appropriate arthropathy of the hands and knees; no edema; no ulcerations.  There is no calf tenderness. Able to mow 4 limbs spontaneously PULSES: 2+ and symmetric   Labs on Admission:  Basic Metabolic Panel:  Recent Labs Lab 10/31/15 0431 11/03/15 0418 11/04/15 0412 11/05/15 0540 11/06/15 0604  NA  --  147* 151* 149* 145  K  --  3.7 3.8 3.6 4.0  CL  --  107 107 107 104  CO2  --  29 32 31 29  GLUCOSE  --  125* 125* 156* 155*  BUN  --  15 22* 21* 18  CREATININE 0.95 0.79 0.91 0.91 0.94  CALCIUM  --  9.4 9.9 9.7 10.0  MG  --   --   --   --  1.8   Liver Function Tests:  Recent Labs Lab 11/03/15 0418 11/04/15 0412 11/05/15 0540  AST 20 19 21   ALT 34 30 28  ALKPHOS 74 75 71  BILITOT 0.9 0.9 0.8  PROT 6.8 7.1 6.7  ALBUMIN 3.4* 3.4* 3.3*   CBC:  Recent Labs Lab 11/03/15 0418 11/04/15 0412 11/05/15 0540  WBC 12.3* 10.2 10.0  NEUTROABS 9.5* 7.8* 7.1  HGB 17.1* 17.9* 17.3*  HCT 51.6 55.1* 51.6  MCV 104.7* 105.6* 103.8*  PLT 182 180 189    Radiological Exams on Admission: No results found.  EKG: Independently reviewed. No signs of acute ischemic changes  PLAN: Hypertensive urgency -improved, worsens when his CIWA scores are elevated. -Continue current antihypertensive regimen. -Blood pressure is a stable  Acute diastolic CHF -2-D echo with ejection fraction of 50-55% and grade 1 diastolic dysfunction. -Now on oral lasix.  -Appears to be compensated -Follow daily weights and strict intake and output  Alcohol abuse -Continue thiamine/folate.B12 level not low -Currently undergoing withdrawals, see above for details. -Was able to be off ativan and Precedex drip now.  -Will initiate treatment with Librium -On dysphagia 3 nectar thick liquids as per speech therapy recommendations  COPD with  acute exacerbation -Resolved. -Patient has completed antibiotic and steroid tapering. -Continue nebulizer treatment.  Acute hypoxemic respiratory failure -Improved, continue oxygen as required. -Due to COPD with exacerbation as well as severe alcohol withdrawals.   Code Status: FULL CODE.   Barton Dubois, MD.   [facility] Pager [phone] 7pm to 7am.  11/06/2015, 4:56 PM

## 2015-11-07 DIAGNOSIS — I10 Essential (primary) hypertension: Secondary | ICD-10-CM

## 2015-11-07 MED ORDER — LORAZEPAM 2 MG/ML IJ SOLN
2.0000 mg | Freq: Four times a day (QID) | INTRAMUSCULAR | Status: DC | PRN
  Administered 2015-11-07 – 2015-11-08 (×2): 2 mg via INTRAVENOUS
  Filled 2015-11-07 (×2): qty 1

## 2015-11-07 MED ORDER — HALOPERIDOL LACTATE 5 MG/ML IJ SOLN
2.0000 mg | Freq: Once | INTRAMUSCULAR | Status: AC
  Administered 2015-11-07: 2 mg via INTRAVENOUS
  Filled 2015-11-07: qty 1

## 2015-11-07 MED ORDER — LORAZEPAM 2 MG/ML IJ SOLN
2.0000 mg | INTRAMUSCULAR | Status: DC | PRN
  Administered 2015-11-07 (×4): 2 mg via INTRAVENOUS
  Filled 2015-11-07 (×4): qty 1

## 2015-11-07 MED ORDER — QUETIAPINE FUMARATE 25 MG PO TABS
25.0000 mg | ORAL_TABLET | Freq: Every day | ORAL | Status: DC
  Administered 2015-11-07 – 2015-11-10 (×4): 25 mg via ORAL
  Filled 2015-11-07 (×4): qty 1

## 2015-11-07 MED ORDER — ENSURE ENLIVE PO LIQD
237.0000 mL | Freq: Two times a day (BID) | ORAL | Status: DC
  Administered 2015-11-08 – 2015-11-11 (×8): 237 mL via ORAL

## 2015-11-07 NOTE — Progress Notes (Signed)
Sitter remains at the bedside in patients room.  He remains on bed sleeping at times.  He does attempt to get out of bed, but is easily redirected for safety purposes.  3 Staff members attempted in ambulating patient today, but was unsuccessful due to weakness in lower limbs.  He was able to stand with a great deal of assistance, for only 1 minute or so, and was placed back on bed. He became quite agitated and after multiple attempts to calm him, Ativan IV was administered with positive effects.  He remains confused throughout this shift.  Wife was at bedside for approximately 30 minutes at 4:00.  At 0830, the order for the Soft Weight restraint was d/c'd, due to the fact that the patient can be directed verbally during confused moments.  Dr. Dyann Kief aware.  Pt continues to rest on bed.  Will continue to monitor.

## 2015-11-07 NOTE — Progress Notes (Signed)
Patient is suppose to be without a sitter or restraints for 24 hours, he is continuously trying to get up out of bed, paged on call MD to make them aware will follow any orders given and continue to monitor the patient.

## 2015-11-07 NOTE — NC FL2 (Signed)
[place] LEVEL OF CARE SCREENING TOOL     IDENTIFICATION  Patient Name: Jeff Miles Birthdate: [birth year] Sex: male Admission Date (Current Location): 10/24/2015  [facility] and [place] Number:  [employer] and Address:  [facility] [address] [place]      Provider Number: [id number]  Attending Physician Name and Address:  Barton Dubois, MD  Relative Name and Phone Number:       Current Level of Care:   Recommended Level of Care: [place] Prior Approval Number:    Date Approved/Denied:   PASRR Number: [id number]  Discharge Plan: SNF    Current Diagnoses: Patient Active Problem List   Diagnosis Date Noted  . Alcohol withdrawal ([place]) 10/25/2015  . Bronchospasm 10/24/2015  . COPD exacerbation ([place]) 10/24/2015  . Hypoxemia 10/24/2015  . URI (upper respiratory infection) 10/24/2015  . Hypertensive urgency 10/24/2015    Orientation RESPIRATION BLADDER Height & Weight     Self  O2 (2L/min) Incontinent Weight: 192 lb 3.9 oz (87.2 kg) Height:  5\' 9"  (175.3 cm)  BEHAVIORAL SYMPTOMS/MOOD NEUROLOGICAL BOWEL NUTRITION STATUS      Incontinent Diet (Dysphagia 3)  AMBULATORY STATUS COMMUNICATION OF NEEDS Skin   Extensive Assist Verbally Other (Comment) (Rash to back)                       Personal Care Assistance Level of Assistance  Total care       Total Care Assistance: Maximum assistance   Functional Limitations Info  Sight, Hearing, Speech Sight Info: Impaired Hearing Info: Impaired      SPECIAL CARE FACTORS FREQUENCY  PT (By licensed PT), OT (By licensed OT)     PT Frequency: 5 OT Frequency: 5            Contractures Contractures Info: Not present    Additional Factors Info  Code Status, Allergies Code Status Info: Full Allergies Info: NKA           Current Medications (11/07/2015):  This is the current hospital active medication list Current  Facility-Administered Medications  Medication Dose Route Frequency Provider Last Rate Last Dose  . 0.9 %  sodium chloride infusion  250 mL Intravenous PRN Erline Hau, MD 10 mL/hr at 11/04/15 0800 250 mL at 11/04/15 0800  . acetaminophen (TYLENOL) tablet 650 mg  650 mg Oral Q6H PRN Erline Hau, MD   650 mg at 11/03/15 [id number]   Or  . acetaminophen (TYLENOL) suppository 650 mg  650 mg Rectal Q6H PRN Erline Hau, MD      . albuterol (PROVENTIL) (2.5 MG/3ML) 0.083% nebulizer solution 2.5 mg  2.5 mg Nebulization Q2H PRN Erline Hau, MD   2.5 mg at 10/27/15 1431  . antiseptic oral rinse (CPC / CETYLPYRIDINIUM CHLORIDE 0.05%) solution 7 mL  7 mL Mouth Rinse BID Erline Hau, MD   7 mL at 11/07/15 2200  . benzonatate (TESSALON) capsule 100 mg  100 mg Oral BID PRN Erline Hau, MD   100 mg at 10/31/15 2045  . chlordiazePOXIDE (LIBRIUM) capsule 10 mg  10 mg Oral TID Barton Dubois, MD   10 mg at 11/07/15 2035  . chlorpheniramine-HYDROcodone (TUSSIONEX) 10-8 MG/5ML suspension 5 mL  5 mL Oral Q12H PRN Erline Hau, MD   5 mL at 10/29/15 1339  . dextromethorphan-guaiFENesin (MUCINEX DM) 30-600 MG per 12 hr tablet 1  tablet  1 tablet Oral BID Erline Hau, MD   1 tablet at 11/07/15 2036  . dextrose 5 % with KCl 20 mEq / L  infusion  20 mEq Intravenous Continuous Barton Dubois, MD 20 mL/hr at 11/06/15 1743 20 mEq at 11/06/15 1743  . feeding supplement (ENSURE ENLIVE) (ENSURE ENLIVE) liquid 237 mL  237 mL Oral BID BM Barton Dubois, MD      . furosemide (LASIX) tablet 40 mg  40 mg Oral Daily Orvan Falconer, MD   40 mg at 11/06/15 0839  . guaiFENesin (ROBITUSSIN) 100 MG/5ML solution 100 mg  5 mL Oral Q4H PRN Erline Hau, MD   100 mg at 10/26/15 1207  . heparin injection 5,000 Units  5,000 Units Subcutaneous 3 times per day Orvan Falconer, MD   5,000 Units at 11/07/15 2035  . hydrALAZINE (APRESOLINE) injection 5  mg  5 mg Intravenous Q6H PRN Erline Hau, MD   5 mg at 11/03/15 0005  . levalbuterol (XOPENEX) nebulizer solution 0.63 mg  0.63 mg Nebulization BID Orvan Falconer, MD   0.63 mg at 11/06/15 2010  . LORazepam (ATIVAN) injection 2 mg  2 mg Intravenous Q6H PRN Barton Dubois, MD      . multivitamin with minerals tablet 1 tablet  1 tablet Oral Daily Estela Leonie Green, MD   1 tablet at 11/06/15 [phone]  . ondansetron (ZOFRAN) tablet 4 mg  4 mg Oral Q6H PRN Erline Hau, MD       Or  . ondansetron [facility]) injection 4 mg  4 mg Intravenous Q6H PRN Erline Hau, MD      . pantoprazole (PROTONIX) EC tablet 40 mg  40 mg Oral Q0600 Orvan Falconer, MD   40 mg at 11/06/15 0839  . QUEtiapine (SEROQUEL) tablet 25 mg  25 mg Oral QHS Barton Dubois, MD   25 mg at 11/07/15 2036  . RESOURCE THICKENUP CLEAR   Oral PRN Orvan Falconer, MD      . senna-docusate (Senokot-S) tablet 1 tablet  1 tablet Oral QHS PRN Erline Hau, MD      . sodium chloride flush (NS) 0.9 % injection 3 mL  3 mL Intravenous Q12H Erline Hau, MD   3 mL at 11/07/15 2037  . sodium chloride flush (NS) 0.9 % injection 3 mL  3 mL Intravenous PRN Erline Hau, MD      . thiamine (VITAMIN B-1) tablet 100 mg  100 mg Oral Daily Estela Leonie Green, MD   100 mg at 11/06/15 [id number]   Or  . thiamine (B-1) injection 100 mg  100 mg Intravenous Daily Erline Hau, MD   100 mg at 11/07/15 1708     Discharge Medications: Please see discharge summary for a list of discharge medications.  Relevant Imaging Results:  Relevant Lab Results:   Additional Information SSN:  [id number] [place], [place]

## 2015-11-07 NOTE — Progress Notes (Signed)
Nutrition Follow-up  DOCUMENTATION CODES:  Not applicable  INTERVENTION:  Ensure Enlive po BID, each supplement provides 350 kcal and 20 grams of protein. Thickened to HTL.   Continue to monitor PO intake and improvement in nutritional status. Appears to be able to meet >50% estimated needs at this time.   NUTRITION DIAGNOSIS:  Inadequate oral intake related to lethargy/confusion as evidenced by Almost no intake in nearly 12 days.  Finally resolving  GOAL:  Patient will meet greater than or equal to 90% of their needs  MONITOR:  PO intake, Labs, I & O's, Weight trends  ASSESSMENT:  65 y/o male PMHx significant for COPD, HTN. Had not seen medical provider in >20 years. Initially presented with 3 month hx of SOB and LEE. Reported significant etoh and tobacco abuse.  Admitted for significant HTN, developed chf later. Hospital course complicated by significant alcohol withdrawal requiring sedation with precedex for ~5 days. May have aspirated on secretions 2/19.  Interval hx as of 2/24: Pt was cleared by speech therapy for po intake. He was initially placed on Dysphagia 3 with NTL. Has since been downgraded by separate SLP to HTL. AMS continues. Has required restraints.   Per meal intake documentation, this is the first day pt has been able to eat >50% of his meals. He had essentially gone 12 days without nutrition.   On RD arrival, pt seen asleep with mits on and mats on side of bed. Also bed tilted downwards. NT and family member report that pt still has severe cases of AMS. Family member did think he was overall improving.   NT reports that she has been feeding the patient meals and he has been doing very well with them despite going so long without food. Family member states that pt still exhibits some coughing with PO intake. It sounds like he will take breaths while trying to eat. Hopefully this will resolve with his confusion.   Family member has been cutting up food into finer  pieces. RD explained the food should already come to him as D3 and should not need to be prepared further. RD asked if she thought pt would do better with D2 (minced) diet. It sounded like she was doing this to comply with SLP recommendations rather than because pt was not tolerating the current diet.   Thankfully, NT reports good hydration despite HTL as hydration would be an issue; pt has pulled on his  IVs. No documented UOP yesterday.  Had BM yesterday. No N/V/C/D  RD went over importance of nutrition and hopefully better nourishment will hasten an improvement in his mental status. Went over various supplement choices. Family member thought pt would do best with Ensure. Will order BID, thickened to HTL.  He is already receiving mvi with minerals.   Also of note, family member reports patients BW was around 200, but was falsely elevated as he carried a lot of fluid with him.   NFPE: No apparent muscle/fat wasting. Edema is improved since RD first saw, likely reason for weight loss. He is still 8.3 liters negative since admission.   Labs reviewed: Hypernatremic (resolved), Hyperglycemic, hypoalbuminemia.   Diet Order:  DIET DYS 3 Room service appropriate?: Yes; Fluid consistency:: Honey Thick  Skin: Rash on back, red/dry   Last BM:  2/23  Height:  Ht Readings from Last 1 Encounters:  10/24/15 5\' 9"  (1.753 m)   Weight:  Wt Readings from Last 1 Encounters:  11/04/15 192 lb 3.9 oz (87.2 kg)  Ideal Body Weight:  72.7 kg  BMI:  Body mass index is 28.38 kg/(m^2).  Estimated Nutritional Needs:  Kcal:  1850-2100 (21-24 kcal/kg bw) Protein:  73-87 g (1-1.2 g/kg ibw) Fluid:  PER MD  EDUCATION NEEDS:  No education needs identified at this time  Burtis Junes RD, LDN Nutrition Pager: [phone] 11/07/2015 3:05 PM

## 2015-11-07 NOTE — Clinical Social Work Note (Signed)
Clinical Social Work Assessment  Patient Details  Name: Jeff Miles MRN: [id number] Date of Birth: 08-18-1951  Date of referral:  11/07/15               Reason for consult:  Facility Placement                Permission sought to share information with:    Permission granted to share information::   (Permission gained from wife and patient is not oriented)     Housing/Transportation Living arrangements for the past 2 months:  Single Family Home Source of Information:  Spouse Patient Interpreter Needed:  None Criminal Activity/Legal Involvement Pertinent to Current Situation/Hospitalization:  No - Comment as needed Significant Relationships:  Spouse Lives with:  Spouse Do you feel safe going back to the place where you live?  No (Wife does not feel she can manage patient in current condition) Need for family participation in patient care:  No (Coment)  Care giving concerns:  Wife is doubtful she can provide care for patient in current physical condition. She works at [employer] and there is no one to stay with him when she is at work.   Social Worker assessment / plan:  CSW met with wife to discuss PT's recommendation for SNF placement. He is currently poorly responsive and requires mitten restraints as he will pull out IV's.  He is oriented to person only and this is not baseline for patient.  Wife has many concerns about his mentation and why he remains confused.  (Patient is not longer in detox).  CSW provided support and encouraged her to continue to talk to MD.  Discussed SNF bed search process and she agrees to search; hopes for patient to be placed in [place] if possible due to her work and difficulty driving outside the city. FL2 will be placed on chart for MD's signature.  Employment status:  Disabled (Comment on whether or not currently receiving Disability) Insurance information:  Managed Care PT Recommendations:  [place] / Referral to  community resources:  [place]  Patient/Family's Response to care:  Wife is very worried about patient's continued level of confusion.  Patient/Family's Understanding of and Emotional Response to Diagnosis, Current Treatment, and Prognosis: Wife is very worried about husband's condition and lack of progress and appears to have many questions about his care needs. She is unhappy with thought of SNF but states she cannot manage him at this time.  Patient did not respond during CSWs visit.  Emotional Assessment Appearance:  Appears older than stated age Attitude/Demeanor/Rapport:  Unable to Assess (Would not open eyes, lethargic) Affect (typically observed):  Unable to Assess (currently with mitten restraints. did not open eyes) Orientation:  Oriented to Self Alcohol / Substance use:  Tobacco Use, Alcohol Use Psych involvement (Current and /or in the community):  No (Comment)  Discharge Needs  Concerns to be addressed:  Care Coordination, Cognitive Concerns, Financial / Insurance Concerns Readmission within the last 30 days:  No Current discharge risk:  Cognitively Impaired, Dependent with Mobility Barriers to Discharge:  Continued Medical Work up   Estill Bakes 11/07/2015, 8:38 PM

## 2015-11-07 NOTE — Progress Notes (Signed)
Speech Language Pathology Treatment: Dysphagia  Patient Details Name: Jeff Miles MRN: [id number] DOB: Jun 09, 1951 Today's Date: 11/07/2015 Time: [id number] SLP Time Calculation (min) (ACUTE ONLY): 25 min  Assessment / Plan / Recommendation Clinical Impression  Administered dysphagia therapy to assess diet tolerance and to provide trials for possible liquid upgrade. Patient with continued lethargy and drowsiness this date. With trials of solids and HTL, the patient demonstrated delayed hard coughing that was ineffective to clear wet vocal quality. He was cooperative and did follow commands to "swallow hard" and "swallow again" after the initial swallow which were both minimally effective for improving wet vocal quality. Recommend continue with D3/Mech soft and HONEY THICK liquids due to signs and symptoms presented during trials and compounding factors of decreased mentation and continued lethargy/drowsiness. Pt would benefit from SNF placement upon d/c to clinically assess diet tolerance and upgrade as appropriate. ST to follow.   HPI HPI: Jeff Miles is an 65 y.o. male with poor medical care prior to admission, significant alcohol abuse, admitted for COPD exacerbation on IV steroids, nebs, and antibiotics, went into severe withdrawal requiring IV Precedex. He was also found to have severe HTN and diastolic CHF. Attempted to wean Precedex was not sucessful even after several days. Eventually, he was given IV ativan, and tapered. He had one episode of respiratory distress, and may have aspirated. He could also have CHF, as his lasix was discontinued due to prior hypotension. His Lasix was placed back and he has improved slowly.      SLP Plan  Continue with current plan of care     Recommendations  Diet recommendations: Dysphagia 3 (mechanical soft);Honey-thick liquid Liquids provided via: Cup Medication Administration: Whole meds with liquid Supervision: Full supervision/cueing  for compensatory strategies Compensations: Minimize environmental distractions;Slow rate;Small sips/bites;Effortful swallow;Clear throat intermittently;Multiple dry swallows after each bite/sip Postural Changes and/or Swallow Maneuvers: Seated upright 90 degrees             Oral Care Recommendations: Oral care BID;Staff/trained caregiver to provide oral care Follow up Recommendations: Skilled Nursing facility Plan: Continue with current plan of care     Rawley Harju H. Roddie Mc, CCC-SLP Speech Language Pathologist       Wende Bushy 11/07/2015, 4:55 PM

## 2015-11-07 NOTE — Progress Notes (Signed)
Patient lying quietly on bed with eyes closed.  Appears to be sleeping.  No sitter at bedside.  Patient does not appear to be in distress.  Will continue to monitor

## 2015-11-07 NOTE — Progress Notes (Signed)
Physical Therapy Treatment Patient Details Name: Jeff Miles MRN: [id number] DOB: 06/10/1951 Today's Date: 11/07/2015    History of Present Illness Patient is a 65 year old man who has not had any medical care in the past 20 years who presents to the hospital on 10/24/15  with a 3 month history of worsening shortness of breath or lower extremity edema. He visited an urgent care yesterday and was told he probably had pneumonia and advised him to come to the emergency department today. Denies any fevers or chills, states that the shortness of breath is worse with exertion, he smokes a pack and half a day and has done so for over 20 years, states he drinks about 6 beers a day, for the past 7 days he has had a runny nose and congestion as well as a nonproductive cough. In the emergency department he was found to be markedly hypertensive with an initial blood pressure of 205/134. Labs were essentially unremarkable with the exception of slight hemoconcentration with a hemoglobin of 17.5 chest x-ray showed mild chronic kidney changes but no acute findings. Initial troponin was normal at 0.03 and EKG was normal with the exception of sinus tachycardia with a rate of 111. Avonex for further evaluation and management.    PT Comments    Pt found with nurse tech and sitter in his room adjusting his restraints. Pt was oriented to person only, but willing to participate in therapy. He has improved with bed mobility and transfers. He was able to ambulate 8' with RW, noting poor safety awareness, pushing the walker out in front of him and modA to correct. Pt repeatedly wanting to "go out to the car". He attempted ankle pumps sitting EOB, however he was unwilling to participate any further. Pt will continue to benefit from skilled PT to address his limitations in balance, strength, mobility and safety awareness while at [facility].   Follow Up Recommendations  SNF     Equipment Recommendations  None recommended by PT     Recommendations for Other Services       Precautions / Restrictions Precautions Precautions: Fall Restrictions Weight Bearing Restrictions: No    Mobility  Bed Mobility Overal bed mobility: Needs Assistance Bed Mobility: Sit to Supine       Sit to supine: Min guard (With BLE)      Transfers Overall transfer level: Needs assistance Equipment used: Rolling walker (2 wheeled) Transfers: Sit to/from Stand Sit to Stand: Min guard         General transfer comment: Pt attempting to pull on the RW to stand initially, corrected by therapist  Ambulation/Gait Ambulation/Gait assistance: Min assist;Mod assist Ambulation Distance (Feet): 8 Feet Assistive device: Rolling walker (2 wheeled) Gait Pattern/deviations: Decreased step length - right;Decreased step length - left;Wide base of support;Trunk flexed   Gait velocity interpretation: <1.8 ft/sec, indicative of risk for recurrent falls General Gait Details: Pt with poor safety awareness and use of RW during ambulation, pushing the walker out in front of him. Therapist providing maximal cues with nurse tech assistance to steer walker. Pt also demonstrating difficulty initiating steps for 3 trials evident by several alternating steps in place.   Stairs            Wheelchair Mobility    Modified Rankin (Stroke Patients Only)       Balance   Sitting-balance support: No upper extremity supported Sitting balance-Leahy Scale: Good     Standing balance support: Bilateral upper extremity supported Standing balance-Leahy Scale: Fair  Cognition Arousal/Alertness: Awake/alert Behavior During Therapy: Impulsive Overall Cognitive Status: Impaired/Different from baseline Area of Impairment: Safety/judgement;Orientation Orientation Level: Disoriented to;Place;Time       Safety/Judgement: Decreased awareness of safety          Exercises General Exercises - Lower Extremity Ankle  Circles/Pumps: Both;AROM;10 reps    General Comments        Pertinent Vitals/Pain Pain Assessment: No/denies pain    Home Living                      Prior Function            PT Goals (current goals can now be found in the care plan section) Acute Rehab PT Goals Patient Stated Goal: none verbaized Time For Goal Achievement: 11/18/15 Potential to Achieve Goals: Good Progress towards PT goals: Progressing toward goals    Frequency  Min 3X/week    PT Plan Current plan remains appropriate    Co-evaluation             End of Session Equipment Utilized During Treatment: Gait belt Activity Tolerance: Treatment limited secondary to medical complications (Comment) Patient left: in bed;with call bell/phone within reach;with nursing/sitter in room;with restraints reapplied     Time: 1310-1336 PT Time Calculation (min) (ACUTE ONLY): 26 min  Charges:  $Gait Training: 8-22 mins $Therapeutic Activity: 8-22 mins                    G Codes:       2:04 PM,Nov 24, 2015 Elly Modena PT, DPT [facility] Outpatient Physical Therapy [phone]

## 2015-11-07 NOTE — Care Management (Signed)
Discussed case with attending Dr Dyann Kief. Patient will start librium, making small improvements. Continues with sitter in room. PT evaluation suggests SNF. Continue to follow.

## 2015-11-07 NOTE — Clinical Social Work Placement (Signed)
   CLINICAL SOCIAL WORK PLACEMENT  NOTE  Date:  11/07/2015  Patient Details  Name: Jeff Miles MRN: [id number] Date of Birth: 02-Oct-1950  Clinical Social Work is seeking post-discharge placement for this patient at the [place] level of care (*CSW will initial, date and re-position this form in  chart as items are completed):  Yes   Patient/family provided with [place] Work Department's list of facilities offering this level of care within the geographic area requested by the patient (or if unable, by the patient's family).  Yes   Patient/family informed of their freedom to choose among providers that offer the needed level of care, that participate in Medicare, Medicaid or managed care program needed by the patient, have an available bed and are willing to accept the patient.  Yes   Patient/family informed of [facility]'s ownership interest in [facility] and [facility], as well as of the fact that they are under no obligation to receive care at these facilities.  PASRR submitted to EDS on 11/07/15     PASRR number received on 11/07/15     Existing PASRR number confirmed on       FL2 transmitted to all facilities in geographic area requested by pt/family on 11/07/15     FL2 transmitted to all facilities within larger geographic area on       Patient informed that his/her managed care company has contracts with or will negotiate with certain facilities, including the following:   Loss adjuster, chartered)         Patient/family informed of bed offers received.  Patient chooses bed at       Physician recommends and patient chooses bed at      Patient to be transferred to   on  .  Patient to be transferred to facility by Ambulance [facility])     Patient family notified on   of transfer.  Name of family member notified:        PHYSICIAN Please prepare priority discharge summary, including medications, Please sign FL2, Please prepare  prescriptions     Additional Comment:    _______________________________________________ Williemae Area, LCSW 11/07/2015, 8:44 PM

## 2015-11-07 NOTE — Progress Notes (Signed)
Jeff Miles PROGRESS NOTE  Jeff Miles Jeff Miles DOB: 1950-12-15    PCP:   No PCP Per Patient   HPI:  HPI: Jeff Miles is an 65 y.o. male with poor medical care prior to admission, significant alcohol abuse, admitted for COPD exacerbation on IV steroids, nebs, and antibiotics, went into severe withdrawal requiring IV Precedex. He was also found to have severe HTN and diastolic CHF. Attempted to wean Precedex was not sucessful even after several days. Eventually, he was given IV ativan, and tapered. He had one episode of respiratory distress, and may have aspirated. He could also have CHF, as his lasix was discontinued due to prior hypotensive episode. His Lasix was placed back and he is a slowly improving.   He continued to be confused and currently oriented just to person. Patient is less agitated and requiring only mittens and bed sitter for safety.  No fever, no chest pain, no shortness of breath.  Rewiew of Systems: Unreliable.   Past Medical History  Diagnosis Date  . COPD (chronic obstructive pulmonary disease) ([place])   . Hypertension     Past Surgical History  Procedure Laterality Date  . Back surgery    . Nose surgery    . Inner ear surgery      Medications:  HOME MEDS: Prior to Admission medications   Medication Sig Start Date End Date Taking? Authorizing Provider  acetaminophen (TYLENOL) 500 MG tablet Take 500 mg by mouth every 6 (six) hours as needed for mild pain or moderate pain.   Yes Historical Provider, MD  azithromycin (ZITHROMAX) 250 MG tablet Take 250-500 mg by mouth See admin instructions. Take two tablets by mouth on day 1 then take one tablet on days 2 through 5 starting on 10/23/2015   Yes Historical Provider, MD  dextromethorphan-guaiFENesin (MUCINEX DM) 30-600 MG 12hr tablet Take 1 tablet by mouth 2 (two) times daily as needed for cough.   Yes Historical Provider, MD  guaiFENesin-dextromethorphan (ROBITUSSIN DM) 100-10 MG/5ML  syrup Take 10 mLs by mouth every 4 (four) hours as needed for cough.   Yes Historical Provider, MD  oxymetazoline (AFRIN) 0.05 % nasal spray Place 2 sprays into both nostrils 2 (two) times daily as needed for congestion.   Yes Historical Provider, MD  Phenyleph-CPM-DM-Aspirin (ALKA-SELTZER PLUS COLD & COUGH) 7.04-14-09-325 MG TBEF Take 1 tablet by mouth daily as needed (for cold and congestion).   Yes Historical Provider, MD  predniSONE (DELTASONE) 20 MG tablet Take 20 mg by mouth See admin instructions. Starting on 10/23/15 take 3 tablets daily for 4 days, then take 1 tablet daily for 3 days   Yes Historical Provider, MD     Allergies:  No Known Allergies  Social History:   reports that he has been smoking.  He does not have any smokeless tobacco history on file. He reports that he drinks alcohol. He reports that he does not use illicit drugs.  Family History: History reviewed. No pertinent family history.   Physical Exam: Filed Vitals:   11/06/15 1537 11/06/15 1546 11/06/15 2010 11/06/15 2229  BP: 132/61 146/79  133/88  Pulse: 72 107  101  Temp: 98.3 F (36.8 C) 98.2 F (36.8 C)  98.5 F (36.9 C)  TempSrc: Oral Oral  Oral  Resp: 18 18  15   Height:      Weight:      SpO2: 97% 96% 95% 94%   Blood pressure 133/88, pulse 101, temperature 98.5 F (36.9 C), temperature source Oral,  resp. rate 15, height 5\' 9"  (1.753 m), weight 87.2 kg (192 lb 3.9 oz), SpO2 94 %.  GEN:  Pleasant; sleepy and oriented to person only. Able to follow simple commands and directions. Still confused. Just with mittens and sitter for safety currently  HEENT: Mucous membranes pink and anicteric; PERRLA; EOM intact; no cervical lymphadenopathy nor thyromegaly or carotid bruit; no JVD; There were no stridor. Neck is very supple. No drainage out of his ears or nostrils CHEST: Normal respiration, scattered rhonchi, no wheezing, no crackles HEART: Regular rate and rhythm.  There are no murmur, rub, or gallops.    ABDOMEN: soft and non-tender; no masses, no organomegaly, normal abdominal bowel sounds; no pannus; no intertriginous candida. There is no rebound and no distention. EXTREMITIES: No bone or joint deformity; age-appropriate arthropathy of the hands and knees; no edema; no ulcerations.  There is no calf tenderness. Able to mow 4 limbs spontaneously PULSES: 2+ and symmetric   Labs on Admission:  Basic Metabolic Panel:  Recent Labs Lab 11/03/15 0418 11/04/15 0412 11/05/15 0540 11/06/15 0604  NA 147* 151* 149* 145  K 3.7 3.8 3.6 4.0  CL 107 107 107 104  CO2 29 32 31 29  GLUCOSE 125* 125* 156* 155*  BUN 15 22* 21* 18  CREATININE 0.79 0.91 0.91 0.94  CALCIUM 9.4 9.9 9.7 10.0  MG  --   --   --  1.8   Liver Function Tests:  Recent Labs Lab 11/03/15 0418 11/04/15 0412 11/05/15 0540  AST 20 19 21   ALT 34 30 28  ALKPHOS 74 75 71  BILITOT 0.9 0.9 0.8  PROT 6.8 7.1 6.7  ALBUMIN 3.4* 3.4* 3.3*   CBC:  Recent Labs Lab 11/03/15 0418 11/04/15 0412 11/05/15 0540  WBC 12.3* 10.2 10.0  NEUTROABS 9.5* 7.8* 7.1  HGB 17.1* 17.9* 17.3*  HCT 51.6 55.1* 51.6  MCV 104.7* 105.6* 103.8*  PLT 182 180 189    Radiological Exams on Admission: No results found.  EKG: Independently reviewed. No signs of acute ischemic changes  PLAN: Hypertensive urgency -worsens when his CIWA scores was elevated. -Continue current antihypertensive regimen. -Blood pressure is a stable and well controlled now   Acute diastolic CHF -2-D echo with ejection fraction of 50-55% and grade 1 diastolic dysfunction. -continue current dose of oral lasix.  -Appears to be compensated -Follow daily weights and strict intake and output  Alcohol abuse -Continue thiamine/folate. -B12 level not low -Was able to be off ativan and Precedex drip now.  -Will initiate treatment with PO Librium and QHS seroquel; minimize/avoid restrains and benzo's as much as possible  -On dysphagia 3 nectar thick liquids as per  speech therapy recommendations  COPD with acute exacerbation -Resolved. -Patient has completed antibiotic and steroid tapering. -Continue nebulizer treatment.  Acute hypoxemic respiratory failure -Improved, continue oxygen as required. -Due to COPD with exacerbation as well as severe alcohol withdrawals. -stable and with good O2 sat on current [place] supplementation   Physical deconditioning  -will most likely need SNF at discharge for rehab -follow clinical response    Code Status: FULL CODE.   Barton Dubois, MD.   Jeff Miles Pager [phone] 7pm to 7am.  11/07/2015, 6:05 PM

## 2015-11-08 DIAGNOSIS — R1310 Dysphagia, unspecified: Secondary | ICD-10-CM

## 2015-11-08 DIAGNOSIS — R5381 Other malaise: Secondary | ICD-10-CM

## 2015-11-08 DIAGNOSIS — R451 Restlessness and agitation: Secondary | ICD-10-CM

## 2015-11-08 LAB — BLOOD GAS, ARTERIAL
Acid-Base Excess: 5.9 mmol/L — ABNORMAL HIGH (ref 0.0–2.0)
Bicarbonate: 29 mEq/L — ABNORMAL HIGH (ref 20.0–24.0)
Drawn by: 105551
O2 CONTENT: 2 L/min
O2 SAT: 92.3 %
PCO2 ART: 46.6 mmHg — AB (ref 35.0–45.0)
PO2 ART: 68.6 mmHg — AB (ref 80.0–100.0)
pH, Arterial: 7.427 (ref 7.350–7.450)

## 2015-11-08 LAB — CBC WITH DIFFERENTIAL/PLATELET
BASOS PCT: 1 %
Basophils Absolute: 0 10*3/uL (ref 0.0–0.1)
EOS PCT: 3 %
Eosinophils Absolute: 0.2 10*3/uL (ref 0.0–0.7)
HCT: 48 % (ref 39.0–52.0)
HEMOGLOBIN: 16 g/dL (ref 13.0–17.0)
Lymphocytes Relative: 29 %
Lymphs Abs: 2.4 10*3/uL (ref 0.7–4.0)
MCH: 33.9 pg (ref 26.0–34.0)
MCHC: 33.3 g/dL (ref 30.0–36.0)
MCV: 101.7 fL — ABNORMAL HIGH (ref 78.0–100.0)
MONO ABS: 0.6 10*3/uL (ref 0.1–1.0)
MONOS PCT: 7 %
NEUTROS ABS: 4.8 10*3/uL (ref 1.7–7.7)
Neutrophils Relative %: 60 %
PLATELETS: 172 10*3/uL (ref 150–400)
RBC: 4.72 MIL/uL (ref 4.22–5.81)
RDW: 11.9 % (ref 11.5–15.5)
WBC: 8.1 10*3/uL (ref 4.0–10.5)

## 2015-11-08 LAB — COMPREHENSIVE METABOLIC PANEL
ALBUMIN: 3.4 g/dL — AB (ref 3.5–5.0)
ALK PHOS: 75 U/L (ref 38–126)
ALT: 36 U/L (ref 17–63)
ANION GAP: 9 (ref 5–15)
AST: 33 U/L (ref 15–41)
BUN: 13 mg/dL (ref 6–20)
CALCIUM: 9.6 mg/dL (ref 8.9–10.3)
CHLORIDE: 102 mmol/L (ref 101–111)
CO2: 33 mmol/L — AB (ref 22–32)
Creatinine, Ser: 0.94 mg/dL (ref 0.61–1.24)
GFR calc non Af Amer: >60 mL/min (ref >60)
GLUCOSE: 127 mg/dL — AB (ref 65–99)
POTASSIUM: 3.9 mmol/L (ref 3.5–5.1)
SODIUM: 144 mmol/L (ref 135–145)
Total Bilirubin: 0.6 mg/dL (ref 0.3–1.2)
Total Protein: 6.6 g/dL (ref 6.5–8.1)

## 2015-11-08 LAB — TROPONIN I: Troponin I: <0.03 ng/mL (ref <0.031)

## 2015-11-08 LAB — BRAIN NATRIURETIC PEPTIDE: B Natriuretic Peptide: 31 pg/mL (ref 0.0–100.0)

## 2015-11-08 MED ORDER — LORAZEPAM 2 MG/ML IJ SOLN
2.0000 mg | INTRAMUSCULAR | Status: DC | PRN
  Administered 2015-11-08 – 2015-11-09 (×7): 2 mg via INTRAVENOUS
  Administered 2015-11-11: 3 mg via INTRAVENOUS
  Filled 2015-11-08 (×3): qty 1
  Filled 2015-11-08: qty 2
  Filled 2015-11-08 (×4): qty 1

## 2015-11-08 MED ORDER — VITAMIN B-12 1000 MCG PO TABS
1000.0000 ug | ORAL_TABLET | Freq: Every day | ORAL | Status: DC
  Administered 2015-11-10 – 2015-11-11 (×2): 1000 ug via ORAL
  Filled 2015-11-08 (×2): qty 1

## 2015-11-08 MED ORDER — HALOPERIDOL LACTATE 5 MG/ML IJ SOLN
5.0000 mg | Freq: Four times a day (QID) | INTRAMUSCULAR | Status: DC | PRN
Start: 2015-11-08 — End: 2015-11-09
  Administered 2015-11-08 – 2015-11-09 (×5): 5 mg via INTRAVENOUS
  Filled 2015-11-08 (×5): qty 1

## 2015-11-08 NOTE — Progress Notes (Signed)
Pt admitted with COPD exacerbation. Treatment complicated by severe alcohol withdrawal requiring ICU stay and Precedex infusion. Eventually weaned from [place] with great difficulty and downgraded. He has been agitated tonight, confused, combative with staff, repeatedly attempting to get out of bed against the urging of RN.  Security had to be called to the bedside d/t combativeness.  Ativan 2 mg IVP given without appreciable effect.  Pt was examined and chart reviewed.  Haldol 5 mg IM given x1 without improvement in agitation/combativeness.  Pt does not appear to be in resp distress, is afebrile, tachycardic to low 100s but vitals o/w stable.    Behavior consistent with alcohol withdrawal but would seem to be well outside of the traditional timeframe for withdrawal. Will send off basic blood work, including CMP, CBC, and troponin, looking for alternative etiologies.  For safety of pt and staff, will place back on CIWA for now; per CIWA scoring, Ativan 3 mg IV can be given now. Will continue to pursue alternative explanations for his behavior.

## 2015-11-08 NOTE — Progress Notes (Signed)
Patient is still combative and attempting to get out of bed, paged on call MD once again, will follow any new orders received, will continue to monitor patient.

## 2015-11-08 NOTE — Progress Notes (Signed)
Patient is becoming combative, hitting and kicking, on all fours trying to climb out of bed. Paged on call MD to make them aware, will follow orders given and continue to monitor the patient.

## 2015-11-08 NOTE — Progress Notes (Signed)
[facility] PROGRESS NOTE  Jeff Miles [id number] DOB: May 24, 1951    PCP:   No PCP Per Patient   HPI:  HPI: Jeff Miles is an 65 y.o. male with poor medical care prior to admission, significant alcohol abuse, admitted for COPD exacerbation on IV steroids, nebs, and antibiotics, went into severe withdrawal requiring IV Precedex. He was also found to have severe HTN and diastolic CHF. Attempted to wean Precedex was not sucessful even after several days. Eventually, he was given IV ativan, and tapered. He had one episode of respiratory distress, and may have aspirated. He could also have CHF, as his lasix was discontinued due to prior hypotensive episode. His Lasix was placed back and he is a slowly improving.   He continued to be confused and currently oriented just to person. Unfortunately patient continued experiencing an episode of agitation/combativeness requiring intermittent restrains and the use of Ativan for his own safety and the safety of our staff.  No fever, no chest pain, no shortness of breath.  PMH: Past Medical History  Diagnosis Date  . COPD (chronic obstructive pulmonary disease) ([place])   . Hypertension     Past Surgical History  Procedure Laterality Date  . Back surgery    . Nose surgery    . Inner ear surgery      Medications:  HOME MEDS: Prior to Admission medications   Medication Sig Start Date End Date Taking? Authorizing Provider  acetaminophen (TYLENOL) 500 MG tablet Take 500 mg by mouth every 6 (six) hours as needed for mild pain or moderate pain.   Yes Historical Provider, MD  azithromycin (ZITHROMAX) 250 MG tablet Take 250-500 mg by mouth See admin instructions. Take two tablets by mouth on day 1 then take one tablet on days 2 through 5 starting on 10/23/2015   Yes Historical Provider, MD  dextromethorphan-guaiFENesin (MUCINEX DM) 30-600 MG 12hr tablet Take 1 tablet by mouth 2 (two) times daily as needed for cough.   Yes Historical  Provider, MD  guaiFENesin-dextromethorphan (ROBITUSSIN DM) 100-10 MG/5ML syrup Take 10 mLs by mouth every 4 (four) hours as needed for cough.   Yes Historical Provider, MD  oxymetazoline (AFRIN) 0.05 % nasal spray Place 2 sprays into both nostrils 2 (two) times daily as needed for congestion.   Yes Historical Provider, MD  Phenyleph-CPM-DM-Aspirin (ALKA-SELTZER PLUS COLD & COUGH) 7.04-14-09-325 MG TBEF Take 1 tablet by mouth daily as needed (for cold and congestion).   Yes Historical Provider, MD  predniSONE (DELTASONE) 20 MG tablet Take 20 mg by mouth See admin instructions. Starting on 10/23/15 take 3 tablets daily for 4 days, then take 1 tablet daily for 3 days   Yes Historical Provider, MD     Allergies:  No Known Allergies  Social History:   reports that he has been smoking.  He does not have any smokeless tobacco history on file. He reports that he drinks alcohol. He reports that he does not use illicit drugs.  Family History: History reviewed. No pertinent family history.   Physical Exam: Filed Vitals:   11/06/15 2010 11/06/15 2229 11/07/15 2100 11/08/15 0728  BP:  133/88 130/72   Pulse:  101 100   Temp:  98.5 F (36.9 C) 98.2 F (36.8 C)   TempSrc:  Oral Oral   Resp:  15 18   Height:      Weight:      SpO2: 95% 94% 97% 96%   Blood pressure 130/72, pulse 100, temperature 98.2 F (  36.8 C), temperature source Oral, resp. rate 18, height 5\' 9"  (1.753 m), weight 87.2 kg (192 lb 3.9 oz), SpO2 96 %.  GEN:  Pleasant; sleepy and oriented to person only. Able to follow simple commands and directions. Still confused. Just with mittens and sitter for safety currently  HEENT: Mucous membranes pink and anicteric; PERRLA; EOM intact; no cervical lymphadenopathy nor thyromegaly or carotid bruit; no JVD; There were no stridor. Neck is very supple. No drainage out of his ears or nostrils CHEST: Normal respiration, scattered rhonchi, no wheezing, no crackles HEART: Regular rate and rhythm.   There are no murmur, rub, or gallops.   ABDOMEN: soft and non-tender; no masses, no organomegaly, normal abdominal bowel sounds; no pannus; no intertriginous candida. There is no rebound and no distention. EXTREMITIES: No bone or joint deformity; age-appropriate arthropathy of the hands and knees; no edema; no ulcerations.  There is no calf tenderness. Able to mow 4 limbs spontaneously PULSES: 2+ and symmetric   Labs on Admission:  Basic Metabolic Panel:  Recent Labs Lab 11/03/15 0418 11/04/15 0412 11/05/15 0540 11/06/15 0604 11/08/15 0701  NA 147* 151* 149* 145 144  K 3.7 3.8 3.6 4.0 3.9  CL 107 107 107 104 102  CO2 29 32 31 29 33*  GLUCOSE 125* 125* 156* 155* 127*  BUN 15 22* 21* 18 13  CREATININE 0.79 0.91 0.91 0.94 0.94  CALCIUM 9.4 9.9 9.7 10.0 9.6  MG  --   --   --  1.8  --    Liver Function Tests:  Recent Labs Lab 11/03/15 0418 11/04/15 0412 11/05/15 0540 11/08/15 0701  AST 20 19 21  33  ALT 34 30 28 36  ALKPHOS 74 75 71 75  BILITOT 0.9 0.9 0.8 0.6  PROT 6.8 7.1 6.7 6.6  ALBUMIN 3.4* 3.4* 3.3* 3.4*   CBC:  Recent Labs Lab 11/03/15 0418 11/04/15 0412 11/05/15 0540 11/08/15 0701  WBC 12.3* 10.2 10.0 8.1  NEUTROABS 9.5* 7.8* 7.1 4.8  HGB 17.1* 17.9* 17.3* 16.0  HCT 51.6 55.1* 51.6 48.0  MCV 104.7* 105.6* 103.8* 101.7*  PLT 182 180 189 172    Radiological Exams on Admission: No results found.  EKG: Independently reviewed. No signs of acute ischemic changes  PLAN: Hypertensive urgency -worsens when his CIWA scores were elevated. -Continue current antihypertensive regimen. -Blood pressure is a stable and well controlled now   Acute diastolic CHF -2-D echo with ejection fraction of 50-55% and grade 1 diastolic dysfunction. -continue current dose of oral lasix.  -Appears to be compensated -Follow daily weights and strict intake and output  Alcohol abuse -Continue thiamine/folate. -B12 level not low; but will provide supplementation given  alcohol abuse and macrocytic findings  -Was able to be off ativan and Precedex drip now.  -Will initiate treatment with PO Librium and QHS seroquel; will continue to try to minimize/avoid restrains and benzo's as much as possible  -On dysphagia 3 with honey thick liquids as per speech therapy recommendations -Unfortunately patient continued experiencing an episode of agitation/combativeness requiring intermittent restrains and the use of Ativan for his own safety and the safety of our staff. -back on CIWA   COPD with acute exacerbation -Resolved. -Patient has completed antibiotic and steroid tapering. -Continue nebulizer treatment.  Acute hypoxemic respiratory failure -Improved, continue oxygen as required. -Due to COPD with exacerbation as well as severe alcohol withdrawals. -stable and with good O2 sat on current [place] supplementation   Physical deconditioning  -will most likely need SNF  at discharge for rehab -follow clinical response    Code Status: FULL CODE.   Barton Dubois, MD.   [facility] Pager [phone] 7pm to 7am.  11/08/2015, 4:19 PM

## 2015-11-09 DIAGNOSIS — I5033 Acute on chronic diastolic (congestive) heart failure: Secondary | ICD-10-CM

## 2015-11-09 MED ORDER — PHENOBARBITAL SODIUM 130 MG/ML IJ SOLN
130.0000 mg | Freq: Once | INTRAMUSCULAR | Status: AC
  Administered 2015-11-09: 130 mg via INTRAVENOUS
  Filled 2015-11-09: qty 1

## 2015-11-09 MED ORDER — HALOPERIDOL LACTATE 5 MG/ML IJ SOLN
5.0000 mg | Freq: Four times a day (QID) | INTRAMUSCULAR | Status: DC | PRN
  Administered 2015-11-09 – 2015-11-11 (×2): 5 mg via INTRAMUSCULAR
  Filled 2015-11-09 (×2): qty 1

## 2015-11-09 NOTE — Progress Notes (Signed)
Patient is combative, hitting, and kicking, medications are ineffective, paged on call MD to make them aware, will follow any new orders received and continue to monitor the patient.

## 2015-11-09 NOTE — Progress Notes (Signed)
MD came to see patient, orders followed, pt still attempting to get up, made MD aware, will follow any new orders received and continue to monitor the patient.

## 2015-11-09 NOTE — Progress Notes (Signed)
[facility] PROGRESS NOTE  Jeff Miles [id number] DOB: 03/05/1951    PCP:   No PCP Per Patient   HPI:  HPI: Jeff Miles is an 65 y.o. male with poor medical care prior to admission, significant alcohol abuse, admitted for COPD exacerbation on IV steroids, nebs, and antibiotics, went into severe withdrawal requiring IV Precedex. He was also found to have severe HTN and diastolic CHF. Attempted to wean Precedex was not sucessful even after several days. Eventually, he was given IV ativan, and tapered. He had one episode of respiratory distress, and may have aspirated.Treated aggressively for alcohol withdrawal with Precedex drip and then ativan following CIWA protocol.   He continued to be confused and oriented just to person. Unfortunately patient continued experiencing intermittent episodes of agitation/combativeness requiring intermittent use of restrains and the use of Ativan for his own safety and the safety of our staff.  No fever, no chest pain, no shortness of breath.  PMH: Past Medical History  Diagnosis Date  . COPD (chronic obstructive pulmonary disease) ([place])   . Hypertension     Past Surgical History  Procedure Laterality Date  . Back surgery    . Nose surgery    . Inner ear surgery      Medications:  HOME MEDS: Prior to Admission medications   Medication Sig Start Date End Date Taking? Authorizing Provider  acetaminophen (TYLENOL) 500 MG tablet Take 500 mg by mouth every 6 (six) hours as needed for mild pain or moderate pain.   Yes Historical Provider, MD  azithromycin (ZITHROMAX) 250 MG tablet Take 250-500 mg by mouth See admin instructions. Take two tablets by mouth on day 1 then take one tablet on days 2 through 5 starting on 10/23/2015   Yes Historical Provider, MD  dextromethorphan-guaiFENesin (MUCINEX DM) 30-600 MG 12hr tablet Take 1 tablet by mouth 2 (two) times daily as needed for cough.   Yes Historical Provider, MD   guaiFENesin-dextromethorphan (ROBITUSSIN DM) 100-10 MG/5ML syrup Take 10 mLs by mouth every 4 (four) hours as needed for cough.   Yes Historical Provider, MD  oxymetazoline (AFRIN) 0.05 % nasal spray Place 2 sprays into both nostrils 2 (two) times daily as needed for congestion.   Yes Historical Provider, MD  Phenyleph-CPM-DM-Aspirin (ALKA-SELTZER PLUS COLD & COUGH) 7.04-14-09-325 MG TBEF Take 1 tablet by mouth daily as needed (for cold and congestion).   Yes Historical Provider, MD  predniSONE (DELTASONE) 20 MG tablet Take 20 mg by mouth See admin instructions. Starting on 10/23/15 take 3 tablets daily for 4 days, then take 1 tablet daily for 3 days   Yes Historical Provider, MD     Allergies:  No Known Allergies  Social History:   reports that he has been smoking.  He does not have any smokeless tobacco history on file. He reports that he drinks alcohol. He reports that he does not use illicit drugs.  Family History: History reviewed. No pertinent family history.   Physical Exam: Filed Vitals:   11/08/15 2247 11/09/15 0520 11/09/15 0750 11/09/15 1605  BP:  145/109  160/86  Pulse:  115  114  Temp:  98.4 F (36.9 C)  98.6 F (37 C)  TempSrc:  Axillary  Axillary  Resp:  20  24  Height:      Weight:      SpO2: 92% 99% 97% 94%   Blood pressure 160/86, pulse 114, temperature 98.6 F (37 C), temperature source Axillary, resp. rate 24, height 5\' 9"  (1.753  m), weight 87.2 kg (192 lb 3.9 oz), SpO2 94 %.  GEN:  Pleasant; sleepy and oriented to person only. Able to follow simple commands and directions. Still confused. Just with mittens and sitter for safety currently  HEENT: Mucous membranes pink and anicteric; PERRLA; EOM intact; no cervical lymphadenopathy nor thyromegaly or carotid bruit; no JVD; There were no stridor. Neck is very supple. No drainage out of his ears or nostrils CHEST: Normal respiration, scattered rhonchi, no wheezing, no crackles HEART: Regular rate and rhythm.  There  are no murmur, rub, or gallops.   ABDOMEN: soft and non-tender; no masses, no organomegaly, normal abdominal bowel sounds; no pannus; no intertriginous candida. There is no rebound and no distention. EXTREMITIES: No bone or joint deformity; age-appropriate arthropathy of the hands and knees; no edema; no ulcerations.  There is no calf tenderness. Able to mow 4 limbs spontaneously PULSES: 2+ and symmetric   Labs on Admission:  Basic Metabolic Panel:  Recent Labs Lab 11/03/15 0418 11/04/15 0412 11/05/15 0540 11/06/15 0604 11/08/15 0701  NA 147* 151* 149* 145 144  K 3.7 3.8 3.6 4.0 3.9  CL 107 107 107 104 102  CO2 29 32 31 29 33*  GLUCOSE 125* 125* 156* 155* 127*  BUN 15 22* 21* 18 13  CREATININE 0.79 0.91 0.91 0.94 0.94  CALCIUM 9.4 9.9 9.7 10.0 9.6  MG  --   --   --  1.8  --    Liver Function Tests:  Recent Labs Lab 11/03/15 0418 11/04/15 0412 11/05/15 0540 11/08/15 0701  AST 20 19 21  33  ALT 34 30 28 36  ALKPHOS 74 75 71 75  BILITOT 0.9 0.9 0.8 0.6  PROT 6.8 7.1 6.7 6.6  ALBUMIN 3.4* 3.4* 3.3* 3.4*   CBC:  Recent Labs Lab 11/03/15 0418 11/04/15 0412 11/05/15 0540 11/08/15 0701  WBC 12.3* 10.2 10.0 8.1  NEUTROABS 9.5* 7.8* 7.1 4.8  HGB 17.1* 17.9* 17.3* 16.0  HCT 51.6 55.1* 51.6 48.0  MCV 104.7* 105.6* 103.8* 101.7*  PLT 182 180 189 172    Radiological Exams on Admission: No results found.  EKG: Independently reviewed. No signs of acute ischemic changes  PLAN: Hypertensive urgency -worsens when his CIWA scores were elevated. -Continue current antihypertensive regimen. -Blood pressure is a stable and well controlled now   Acute diastolic CHF -2-D echo with ejection fraction of 50-55% and grade 1 diastolic dysfunction. -continue current dose of oral lasix.  -Appears to be compensated -Follow daily weights and strict intake and output  Alcohol abuse/encephalopathy, delirium -Continue thiamine/folate. -B12 level not low; but will provide  supplementation given alcohol abuse and macrocytic findings  -Was able to be off ativan and Precedex drip now.  -Will initiated treatment with PO Librium and QHS seroquel; will continue to try to minimize/avoid restrains and benzo's as much as possible  -On dysphagia 3 with honey thick liquids as per speech therapy recommendations -Unfortunately patient continued experiencing an episode of agitation/combativeness requiring intermittent restrains and the use of Ativan for his own safety and the safety of our staff. -back on CIWA  -Will check ammonia level in the morning  COPD with acute exacerbation -Resolved. -Patient has completed antibiotic and steroid tapering. -Continue nebulizer treatment.  Acute hypoxemic respiratory failure -Improved, continue oxygen as required. -Due to COPD with exacerbation as well as severe alcohol withdrawals. -stable and with good O2 sat on current [place] supplementation   Physical deconditioning  -will most likely need SNF at discharge for rehab -  follow clinical response    Code Status: FULL CODE.   Barton Dubois, MD.   [facility] Pager [phone] 7pm to 7am.  11/09/2015, 4:15 PM

## 2015-11-09 NOTE — Progress Notes (Signed)
Patient is being combative and trying to get out of bed.  Patient pulled another IV out and wont let me put another one in.  MD notified to try to get some Haldol IM instead of IV.  Sitter at bedside.

## 2015-11-09 NOTE — Progress Notes (Signed)
Haldol administered to patient.  Patient continues to be combative.  Sitter at bedside.

## 2015-11-10 LAB — AMMONIA: AMMONIA: 19 umol/L (ref 9–35)

## 2015-11-10 NOTE — Progress Notes (Signed)
Family present today, patient calm and cooperative with wife.  Son present currently and patient sitting up at bedside eating,  AC called, no sitter available tonight, son informed.  Will continue to monitor.  Hand mitts off when family in room.  Patient more alert.

## 2015-11-10 NOTE — Progress Notes (Signed)
Speech Language Pathology Treatment: Dysphagia  Patient Details Name: Jeff Miles MRN: [id number] DOB: 11-30-1950 Today's Date: 11/10/2015 Time: [id number] SLP Time Calculation (min) (ACUTE ONLY): 23 min  Assessment / Plan / Recommendation Clinical Impression  Pt seen at bedside with wife present feeding him lunch meal. She put thickener in his [facility]. Pt presents with strong, hacking cough after sips of liquids. Wife reports this is baseline "for years and years", however pt does sounds congested. She also reports that he has a history of "ear problems" and had tubes years ago. No recent chest x-ray completed- last on 11/03/2015. Pt shows signs of aspiration with all liquids- recommend MBSS prior to D/C. Can complete Tuesday morning or possibly later this afternoon.   HPI HPI: Jeff Miles is an 65 y.o. male with poor medical care prior to admission, significant alcohol abuse, admitted for COPD exacerbation on IV steroids, nebs, and antibiotics, went into severe withdrawal requiring IV Precedex. He was also found to have severe HTN and diastolic CHF. Attempted to wean Precedex was not sucessful even after several days. Eventually, he was given IV ativan, and tapered. He had one episode of respiratory distress, and may have aspirated. He could also have CHF, as his lasix was discontinued due to prior hypotension. His Lasix was placed back and he has improved slowly.      SLP Plan  Continue with current plan of care     Recommendations  Diet recommendations: Dysphagia 2 (fine chop);Honey-thick liquid Liquids provided via: Cup Medication Administration: Whole meds with liquid Supervision: Full supervision/cueing for compensatory strategies Compensations: Minimize environmental distractions;Slow rate;Small sips/bites;Effortful swallow;Clear throat intermittently;Multiple dry swallows after each bite/sip Postural Changes and/or Swallow Maneuvers: Seated upright 90  degrees;Upright 30-60 min after meal             Oral Care Recommendations: Oral care BID;Staff/trained caregiver to provide oral care Follow up Recommendations: Skilled Nursing facility Plan: Continue with current plan of care     Thank you,  Genene Churn, [place]                 [place] 11/10/2015, 1:03 PM

## 2015-11-10 NOTE — Progress Notes (Signed)
Patient is sleeping with no sign or symptoms of distress.  Sitter is at bedside.

## 2015-11-10 NOTE — Progress Notes (Signed)
[facility] PROGRESS NOTE  Jeff Miles [id number] DOB: 1951/02/21    PCP:   No PCP Per Patient   HPI:  HPI: Jeff Miles is an 65 y.o. male with poor medical care prior to admission, significant alcohol abuse, admitted for COPD exacerbation on IV steroids, nebs, and antibiotics, went into severe withdrawal requiring IV Precedex. He was also found to have severe HTN and diastolic CHF. Attempted to wean Precedex was not sucessful even after several days. Eventually, he was given IV ativan, and tapered. He had one episode of respiratory distress, and may have aspirated.Treated aggressively for alcohol withdrawal with Precedex drip and then ativan following CIWA protocol.   He is the most alert ever; following commands, oriented X 2 and essentially no requiring sitter or restrains in the whole day. Will continue current therapy and hope for discharge to SNF for rehab soon. No fever, no chest pain, no shortness of breath.  PMH: Past Medical History  Diagnosis Date  . COPD (chronic obstructive pulmonary disease) ([place])   . Hypertension     Past Surgical History  Procedure Laterality Date  . Back surgery    . Nose surgery    . Inner ear surgery      Medications:  HOME MEDS: Prior to Admission medications   Medication Sig Start Date End Date Taking? Authorizing Provider  acetaminophen (TYLENOL) 500 MG tablet Take 500 mg by mouth every 6 (six) hours as needed for mild pain or moderate pain.   Yes Historical Provider, MD  azithromycin (ZITHROMAX) 250 MG tablet Take 250-500 mg by mouth See admin instructions. Take two tablets by mouth on day 1 then take one tablet on days 2 through 5 starting on 10/23/2015   Yes Historical Provider, MD  dextromethorphan-guaiFENesin (MUCINEX DM) 30-600 MG 12hr tablet Take 1 tablet by mouth 2 (two) times daily as needed for cough.   Yes Historical Provider, MD  guaiFENesin-dextromethorphan (ROBITUSSIN DM) 100-10 MG/5ML syrup Take 10 mLs  by mouth every 4 (four) hours as needed for cough.   Yes Historical Provider, MD  oxymetazoline (AFRIN) 0.05 % nasal spray Place 2 sprays into both nostrils 2 (two) times daily as needed for congestion.   Yes Historical Provider, MD  Phenyleph-CPM-DM-Aspirin (ALKA-SELTZER PLUS COLD & COUGH) 7.04-14-09-325 MG TBEF Take 1 tablet by mouth daily as needed (for cold and congestion).   Yes Historical Provider, MD  predniSONE (DELTASONE) 20 MG tablet Take 20 mg by mouth See admin instructions. Starting on 10/23/15 take 3 tablets daily for 4 days, then take 1 tablet daily for 3 days   Yes Historical Provider, MD     Allergies:  No Known Allergies  Social History:   reports that he has been smoking.  He does not have any smokeless tobacco history on file. He reports that he drinks alcohol. He reports that he does not use illicit drugs.  Family History: History reviewed. No pertinent family history.   Physical Exam: Filed Vitals:   11/10/15 0236 11/10/15 0500 11/10/15 0751 11/10/15 1340  BP: 130/72 176/96  154/87  Pulse:  121  98  Temp:  98.1 F (36.7 C)  98.4 F (36.9 C)  TempSrc:  Axillary  Oral  Resp:    18  Height:      Weight:      SpO2:  92% 93% 95%   Blood pressure 154/87, pulse 98, temperature 98.4 F (36.9 C), temperature source Oral, resp. rate 18, height 5\' 9"  (1.753 m), weight 87.2 kg (  192 lb 3.9 oz), SpO2 95 %.  GEN:  Pleasant; alert, calm and following commands. Cooperative with family members in the room. Intermittent use of mittens only. No sitter today. AAOX2 (person and place)  HEENT: Mucous membranes pink and anicteric; PERRLA; EOM intact; no cervical lymphadenopathy nor thyromegaly or carotid bruit; no JVD; There were no stridor. Neck is very supple. No drainage out of his ears or nostrils CHEST: Normal respiration, scattered rhonchi, no wheezing, no crackles HEART: Regular rate and rhythm.  There are no murmur, rub, or gallops.   ABDOMEN: soft and non-tender; no masses,  no organomegaly, normal abdominal bowel sounds; no pannus; no intertriginous candida. There is no rebound and no distention. EXTREMITIES: No bone or joint deformity; age-appropriate arthropathy of the hands and knees; no edema; no ulcerations.  There is no calf tenderness. Able to mow 4 limbs spontaneously PULSES: 2+ and symmetric   Labs on Admission:  Basic Metabolic Panel:  Recent Labs Lab 11/04/15 0412 11/05/15 0540 11/06/15 0604 11/08/15 0701  NA 151* 149* 145 144  K 3.8 3.6 4.0 3.9  CL 107 107 104 102  CO2 32 31 29 33*  GLUCOSE 125* 156* 155* 127*  BUN 22* 21* 18 13  CREATININE 0.91 0.91 0.94 0.94  CALCIUM 9.9 9.7 10.0 9.6  MG  --   --  1.8  --    Liver Function Tests:  Recent Labs Lab 11/04/15 0412 11/05/15 0540 11/08/15 0701  AST 19 21 33  ALT 30 28 36  ALKPHOS 75 71 75  BILITOT 0.9 0.8 0.6  PROT 7.1 6.7 6.6  ALBUMIN 3.4* 3.3* 3.4*   CBC:  Recent Labs Lab 11/04/15 0412 11/05/15 0540 11/08/15 0701  WBC 10.2 10.0 8.1  NEUTROABS 7.8* 7.1 4.8  HGB 17.9* 17.3* 16.0  HCT 55.1* 51.6 48.0  MCV 105.6* 103.8* 101.7*  PLT 180 189 172    Radiological Exams on Admission: No results found.  EKG: Independently reviewed. No signs of acute ischemic changes  PLAN: Hypertensive urgency -worsens when his CIWA scores were elevated. -Continue current antihypertensive regimen. -Blood pressure is a stable and well controlled now   Acute diastolic CHF -2-D echo with ejection fraction of 50-55% and grade 1 diastolic dysfunction. -continue current dose of oral lasix.  -Appears to be compensated -Follow daily weights and strict intake and output  Alcohol abuse/encephalopathy, delirium -Continue thiamine/folate. -B12 level not low; but will provide supplementation given alcohol abuse and macrocytic findings  -Was able to be off ativan and Precedex drip now.  -Will initiated treatment with PO Librium and QHS seroquel; will continue to try to minimize/avoid  restrains and benzo's as much as possible  -On dysphagia 3 with honey thick liquids as per speech therapy recommendations -Unfortunately patient continued experiencing an episode of agitation/combativeness requiring intermittent restrains and the use of Ativan for his own safety and the safety of our staff. -back on CIWA  -Will check ammonia level in the morning  COPD with acute exacerbation -Resolved. -Patient has completed antibiotic and steroid tapering. -Continue nebulizer treatment.  Acute hypoxemic respiratory failure -Improved, continue oxygen as required. -Due to COPD with exacerbation as well as severe alcohol withdrawals. -stable and with good O2 sat on current [place] supplementation   Physical deconditioning  -will most likely need SNF at discharge for rehab -follow clinical response    Code Status: FULL CODE.   Barton Dubois, MD.   [facility] Pager [phone] 7pm to 7am.  11/10/2015, 7:06 PM

## 2015-11-11 ENCOUNTER — Inpatient Hospital Stay (HOSPITAL_COMMUNITY): Payer: BLUE CROSS/BLUE SHIELD

## 2015-11-11 DIAGNOSIS — T17908A Unspecified foreign body in respiratory tract, part unspecified causing other injury, initial encounter: Secondary | ICD-10-CM | POA: Insufficient documentation

## 2015-11-11 DIAGNOSIS — I5033 Acute on chronic diastolic (congestive) heart failure: Secondary | ICD-10-CM | POA: Insufficient documentation

## 2015-11-11 DIAGNOSIS — R451 Restlessness and agitation: Secondary | ICD-10-CM | POA: Insufficient documentation

## 2015-11-11 DIAGNOSIS — T17998D Other foreign object in respiratory tract, part unspecified causing other injury, subsequent encounter: Secondary | ICD-10-CM

## 2015-11-11 MED ORDER — CYANOCOBALAMIN 1000 MCG PO TABS: 1000.0000 ug | ORAL_TABLET | Freq: Every day | ORAL | Status: DC

## 2015-11-11 MED ORDER — RESOURCE THICKENUP CLEAR PO POWD: ORAL | Status: DC

## 2015-11-11 MED ORDER — BUDESONIDE 0.25 MG/2ML IN SUSP: 0.2500 mg | Freq: Two times a day (BID) | RESPIRATORY_TRACT | Status: DC

## 2015-11-11 MED ORDER — FUROSEMIDE 40 MG PO TABS: 40.0000 mg | ORAL_TABLET | Freq: Every day | ORAL | Status: DC

## 2015-11-11 MED ORDER — BENZONATATE 100 MG PO CAPS: 100.0000 mg | ORAL_CAPSULE | Freq: Two times a day (BID) | ORAL | Status: DC | PRN

## 2015-11-11 MED ORDER — ADULT MULTIVITAMIN W/MINERALS CH: 1.0000 | ORAL_TABLET | Freq: Every day | ORAL | Status: DC

## 2015-11-11 MED ORDER — QUETIAPINE FUMARATE 25 MG PO TABS: 25.0000 mg | ORAL_TABLET | Freq: Every day | ORAL | Status: DC

## 2015-11-11 MED ORDER — CHLORDIAZEPOXIDE HCL 10 MG PO CAPS: 10.0000 mg | ORAL_CAPSULE | Freq: Three times a day (TID) | ORAL | Status: DC

## 2015-11-11 MED ORDER — ENSURE ENLIVE PO LIQD: 237.0000 mL | Freq: Two times a day (BID) | ORAL | Status: DC

## 2015-11-11 MED ORDER — THIAMINE HCL 100 MG PO TABS: 100.0000 mg | ORAL_TABLET | Freq: Every day | ORAL | Status: DC

## 2015-11-11 MED ORDER — PANTOPRAZOLE SODIUM 40 MG PO TBEC: 40.0000 mg | DELAYED_RELEASE_TABLET | Freq: Every day | ORAL | Status: DC

## 2015-11-11 MED ORDER — HALOPERIDOL 1 MG PO TABS: 1.0000 mg | ORAL_TABLET | Freq: Two times a day (BID) | ORAL | Status: DC

## 2015-11-11 MED ORDER — LEVALBUTEROL HCL 0.63 MG/3ML IN NEBU: 0.6300 mg | INHALATION_SOLUTION | Freq: Four times a day (QID) | RESPIRATORY_TRACT | Status: DC | PRN

## 2015-11-11 NOTE — Discharge Summary (Signed)
Physician Discharge Summary  Jeff Miles [id number] DOB: [birth year] DOA: 10/24/2015  PCP: No PCP Per Patient  Admit date: 10/24/2015 Discharge date: 11/11/2015  Time spent: 40 minutes  Recommendations for Outpatient Follow-up:  Dysphagia 3 diet with nectar thick liquids Heart healthy/low sodium diet Daily weights and strict intake/output BMET in 5 days to follow electrolytes and renal function Physical therapy and rehabilitation as per SNF protocol Taper off librium slowly   Discharge Diagnoses:  COPD exacerbation ([place]) Bronchospasm Hypoxemia Hypertensive urgency Alcohol withdrawal ([place]) Acute on chronic diastolic heart failure GERD Agitation/restlessnes   Discharge Condition: discharge in improved condition to SNF for further care and rehabilitation.  Diet recommendation: low sodium heart healthy diet. Dysphagia 3 with nectar thick liquids   Filed Weights   11/02/15 0500 11/03/15 0500 11/04/15 0500  Weight: 90.8 kg (200 lb 2.8 oz) 88 kg (194 lb 0.1 oz) 87.2 kg (192 lb 3.9 oz)    History of present illness:  As per Dr. Jerilee Hoh H&P written on 10/24/15. 65 year old man who has not had any medical care in the past 20 years who presents to the hospital today with a 3 month history of worsening shortness of breath or lower extremity edema. He visited an urgent care yesterday and was told he probably had pneumonia and advised him to come to the emergency department today. Denies any fevers or chills, states that the shortness of breath is worse with exertion, he smokes a pack and half a day and has done so for over 20 years, states he drinks about 6 beers a day, for the past 7 days he has had a runny nose and congestion as well as a nonproductive cough. In the emergency department he was found to be markedly hypertensive with an initial blood pressure of 205/134. Labs were essentially unremarkable with the exception of slight hemoconcentration with a hemoglobin of 17.5 chest  x-ray showed mild chronic kidney changes but no acute findings. Initial troponin was normal at 0.03 and EKG was normal with the exception of sinus tachycardia with a rate of 111. Avonex for further evaluation and management.  Hospital Course:  Hypertensive urgency -worsens when his CIWA scores were elevated. -Continue current antihypertensive regimen. -Blood pressure is a stable and well controlled now. -heart healthy diet and further adjustments as needed.  Acute on chronic diastolic CHF -2-D echo with ejection fraction of 50-55% and grade 1 diastolic dysfunction. -continue current dose of oral lasix.  -Appears to be compensated at discharge -Follow daily weights and strict intake and output -low sodium diet   Alcohol abuse/encephalopathy/delirium/dysphagia -Continue thiamine/folate. -B12 level not low; but will provide supplementation given alcohol abuse and macrocytic findings  -Was able to be weaned off ativan and Precedex drip now.  -initiated treatment with PO Librium, BID low dose of haldol and QHS Seroquel -On dysphagia 3 with honey thick liquids as per speech therapy recommendations -ammonia level within normal limits  COPD with acute exacerbation -acute exacerbation Resolved. -Patient has completed antibiotic and steroid tapering while inpatient. -Continue pulmicort nebulizer treatment and PRN xopenex. -no wheezing and good air movement at discharge. -ok to continue PRN mucolytic and antitussives   Acute hypoxemic respiratory failure -Improved and at discharge with good O2 sat on RA -Due to COPD with exacerbation as well as severe alcohol withdrawals (afecting mentation and ability to maintain proper aeration). -continue meds for mentation stability -treatment for his COPD as mentioned above  Physical deconditioning  -will discharge to SNF for rehab and conditioning  Procedures:  See below for x-ray reports   2-D echo - Left ventricle: The cavity size was  normal. Wall thickness was normal. Systolic function was normal. The estimated ejection fraction was in the range of 50% to 55%. Wall motion was normal; there were no regional wall motion abnormalities. Doppler parameters are consistent with abnormal left ventricular relaxation (grade 1 diastolic dysfunction). Doppler parameters are consistent with high ventricular filling pressure.  Impressions: - Technically difficult; definity used; normal LV systolic function; grade 1 diastolic dysfunction.  Consultations:  None   Discharge Exam: Filed Vitals:   11/11/15 0844 11/11/15 1215  BP: 151/82 143/93  Pulse: 111 116  Temp: 98.9 F (37.2 C) 98.6 F (37 C)  Resp: 18 18   GEN: Pleasant; alert, calm and following commands. Cooperative with family members in the room. No sitter or restrains in > 30 hours. AAOX2 (person and place).  HEENT: Mucous membranes pink and anicteric; PERRLA; EOM intact; no cervical lymphadenopathy nor thyromegaly or carotid bruit; no JVD; There were no stridor. Neck is very supple. No drainage out of his ears or nostrils CHEST: Normal respiration, scattered rhonchi, no wheezing, no crackles HEART: Regular rate and rhythm. There are no murmur, rub, or gallops.  ABDOMEN: soft and non-tender; no masses, no organomegaly, normal abdominal bowel sounds; no pannus; no intertriginous candida. There is no rebound and no distention. EXTREMITIES: No bone or joint deformity; age-appropriate arthropathy of the hands and knees; no edema; no ulcerations. There is no calf tenderness. Able to mow 4 limbs spontaneously PULSES: 2+ and symmetric  Discharge Instructions   Discharge Instructions    Diet - low sodium heart healthy    Complete by:  As directed      Discharge instructions    Complete by:  As directed   Dysphagia 3 diet with nectar thick liquids Heart healthy/low sodium diet Daily weights and strict intake/output BMET in 5 days to follow  electrolytes and renal function Physical therapy and rehabilitation as per SNF protocol Taper off librium slowly          Current Discharge Medication List    START taking these medications   Details  benzonatate (TESSALON) 100 MG capsule Take 1 capsule (100 mg total) by mouth 2 (two) times daily as needed for cough.    chlordiazePOXIDE (LIBRIUM) 10 MG capsule Take 1 capsule (10 mg total) by mouth 3 (three) times daily. Qty: 30 capsule, Refills: 0    feeding supplement, ENSURE ENLIVE, (ENSURE ENLIVE) LIQD Take 237 mLs by mouth 2 (two) times daily between meals. Qty: 237 mL, Refills: 12    furosemide (LASIX) 40 MG tablet Take 1 tablet (40 mg total) by mouth daily. Qty: 30 tablet    haloperidol (HALDOL) 1 MG tablet Take 1 tablet (1 mg total) by mouth 2 (two) times daily.    levalbuterol (XOPENEX) 0.63 MG/3ML nebulizer solution Take 3 mLs (0.63 mg total) by nebulization every 6 (six) hours as needed for wheezing or shortness of breath.    Maltodextrin-Xanthan Gum (RESOURCE THICKENUP CLEAR) POWD Use as needed to achieve nectar thickness on liquids    Multiple Vitamin (MULTIVITAMIN WITH MINERALS) TABS tablet Take 1 tablet by mouth daily.    pantoprazole (PROTONIX) 40 MG tablet Take 1 tablet (40 mg total) by mouth daily at 6 (six) AM.    QUEtiapine (SEROQUEL) 25 MG tablet Take 1 tablet (25 mg total) by mouth at bedtime.    thiamine 100 MG tablet Take 1 tablet (100 mg total) by  mouth daily.    vitamin B-12 1000 MCG tablet Take 1 tablet (1,000 mcg total) by mouth daily.      CONTINUE these medications which have NOT CHANGED   Details  acetaminophen (TYLENOL) 500 MG tablet Take 500 mg by mouth every 6 (six) hours as needed for mild pain or moderate pain.    dextromethorphan-guaiFENesin (MUCINEX DM) 30-600 MG 12hr tablet Take 1 tablet by mouth 2 (two) times daily as needed for cough.    oxymetazoline (AFRIN) 0.05 % nasal spray Place 2 sprays into both nostrils 2 (two) times daily  as needed for congestion.      STOP taking these medications     azithromycin (ZITHROMAX) 250 MG tablet      guaiFENesin-dextromethorphan (ROBITUSSIN DM) 100-10 MG/5ML syrup      Phenyleph-CPM-DM-Aspirin (ALKA-SELTZER PLUS COLD & COUGH) 7.04-14-09-325 MG TBEF      predniSONE (DELTASONE) 20 MG tablet        No Known Allergies Follow-up Information    Follow up with [facility] .   Specialty:  [place] information:   [address] [phone]      The results of significant diagnostics from this hospitalization (including imaging, microbiology, ancillary and laboratory) are listed below for reference.    Significant Diagnostic Studies: Dg Chest 2 View  10/24/2015  CLINICAL DATA:  Productive cough for several months with some shortness of breath, patient smokes EXAM: CHEST  2 VIEW COMPARISON:  11/25/2004 FINDINGS: Stable mild cardiac enlargement. Vascular pattern normal. No consolidation or effusion. Stable mild bronchitic change. IMPRESSION: Mild chronic bronchitic change.  No acute findings. Electronically Signed   By: Skipper Cliche M.D.   On: 10/24/2015 09:38   Dg Chest Port 1 View  11/03/2015  CLINICAL DATA:  Congestion EXAM: PORTABLE CHEST 1 VIEW COMPARISON:  10/25/2015 FINDINGS: The heart size and mediastinal contours are within normal limits. Both lungs are clear. The visualized skeletal structures are unremarkable. IMPRESSION: No active disease. Electronically Signed   By: Rolm Baptise M.D.   On: 11/03/2015 12:53   Dg Chest Port 1 View  10/25/2015  CLINICAL DATA:  Shortness of breath.  Hypertension. EXAM: PORTABLE CHEST 1 VIEW COMPARISON:  October 24, 2015 FINDINGS: There is mild central peribronchial thickening. No edema or consolidation. Heart size and pulmonary vascularity normal. No adenopathy. No bone lesions. IMPRESSION: Changes suggesting a degree of chronic bronchitis remain. No edema or  consolidation. No change in cardiac silhouette. Electronically Signed   By: Lowella Grip III M.D.   On: 10/25/2015 08:48   Labs: Basic Metabolic Panel:  Recent Labs Lab 11/05/15 0540 11/06/15 0604 11/08/15 0701  NA 149* 145 144  K 3.6 4.0 3.9  CL 107 104 102  CO2 31 29 33*  GLUCOSE 156* 155* 127*  BUN 21* 18 13  CREATININE 0.91 0.94 0.94  CALCIUM 9.7 10.0 9.6  MG  --  1.8  --    Liver Function Tests:  Recent Labs Lab 11/05/15 0540 11/08/15 0701  AST 21 33  ALT 28 36  ALKPHOS 71 75  BILITOT 0.8 0.6  PROT 6.7 6.6  ALBUMIN 3.3* 3.4*    Recent Labs Lab 11/10/15 0640  AMMONIA 19   CBC:  Recent Labs Lab 11/05/15 0540 11/08/15 0701  WBC 10.0 8.1  NEUTROABS 7.1 4.8  HGB 17.3* 16.0  HCT 51.6 48.0  MCV 103.8* 101.7*  PLT 189 172   Cardiac Enzymes:  Recent  Labs Lab 11/08/15 0701  TROPONINI <0.03   BNP: BNP (last 3 results)  Recent Labs  10/24/15 0907 11/08/15 0701  BNP 61.0 31.0    Signed:  Barton Dubois MD.  [facility] 11/11/2015, 2:00 PM

## 2015-11-11 NOTE — Procedures (Signed)
Objective Swallowing Evaluation: Type of Study: MBS-Modified Barium Swallow Study  Patient Details  Name: Jeff Miles MRN: [id number] Date of Birth: 10-Oct-1950  Today's Date: 11/11/2015 Time: SLP Start Time (ACUTE ONLY): 1100-SLP Stop Time (ACUTE ONLY): 1132 SLP Time Calculation (min) (ACUTE ONLY): 32 min  Past Medical History:  Past Medical History  Diagnosis Date  . COPD (chronic obstructive pulmonary disease) ([place])   . Hypertension    Past Surgical History:  Past Surgical History  Procedure Laterality Date  . Back surgery    . Nose surgery    . Inner ear surgery     HPI: Jeff Miles is an 65 y.o. male with poor medical care prior to admission, significant alcohol abuse, admitted for COPD exacerbation on IV steroids, nebs, and antibiotics, went into severe withdrawal requiring IV Precedex. He was also found to have severe HTN and diastolic CHF. Attempted to wean Precedex was not sucessful even after several days. Eventually, he was given IV ativan, and tapered. He had one episode of respiratory distress, and may have aspirated. He could also have CHF, as his lasix was discontinued due to prior hypotension. His Lasix was placed back and he has improved slowly.  Subjective: "This stuff is nasty!"  Assessment / Plan / Recommendation  CHL IP CLINICAL IMPRESSIONS 11/11/2015  Therapy Diagnosis Mild/Mod oral phase dysphagia;Mild/Mod pharyngeal phase dysphagia  Clinical Impression Overall mild to mild/mod oropharyngeal phase dysphagia negatively impacted by cognition and deconditioning. Oral phase is marked by weak lingual manipulation and bolus propulsion with lack of cohesive bolus (piecemeal deglutition vs oral scatter or residuals necessitating secondary swallows) resulting in oral residuals (more so with liquids); pharyngeal phase is marked by min delay in swallow initiation with swallow trigger after filling valleculae across textures and spilling to pyriforms with  liquids; decreased tongue base retraction, epiglottic deflection, min decrease hyolaryngeal excursion resulting in trace penetration of thins during the swallow (underepiglottic coating and not to the cords) which did not always completely clear, moderate vallecular residue with puree and liquids and mild lateral channel residue with thin and nectar-thick liquids to which pt did NOT appear sensate. He did present with wet vocal quality at times, although nothing visualized on cords. Pt required verbal cues to "swallow again" to help clear pharyngeal residue. Pt also required verbal cues to "clear throat and swallow again" when trace thin noted along underside of epiglottis. No penetration noted with NTL, however residuals in valleculae prominent. SLP showed the image on the screen to the pt (of residuals in pharynx) for feedback and he stated, "that's cool". Recommend D3/mech soft with thin liquids with 100% supervision with eating/drinking to cue pt to: swallow hard, swallow 2-3x for each sip, clear throat periodically and swallow again. Pt has shown improvement in cognition, however remains impulsive and shows little insight. Results of the MBSS were reviewed with pt (with questionable appreciation) and with wife. She was able to verbalize strategies once demonstrated. Pt would benefit from supervised meals/trained caregiver and dysphagia therapy for diet tolerance, implementation of strategies, and oropharyngeal strengthening.      Impact on safety and function Mild aspiration risk      CHL IP TREATMENT RECOMMENDATION 11/11/2015  Treatment Recommendations Defer treatment plan to f/u with SLP     Prognosis 11/05/2015  Prognosis for Safe Diet Advancement Fair  Barriers to Reach Goals Cognitive deficits  Barriers/Prognosis Comment --    CHL IP DIET RECOMMENDATION 11/11/2015  SLP Diet Recommendations Dysphagia 3 (Mech soft) solids;Thin liquid  Liquid Administration via Cup  Medication Administration  Whole meds with liquid  Compensations Minimize environmental distractions;Slow rate;Small sips/bites;Multiple dry swallows after each bite/sip;Clear throat intermittently;Effortful swallow  Postural Changes Remain semi-upright after after feeds/meals (Comment);Seated upright at 90 degrees      CHL IP OTHER RECOMMENDATIONS 11/11/2015  Recommended Consults --  Oral Care Recommendations Oral care BID  Other Recommendations Clarify dietary restrictions      CHL IP FOLLOW UP RECOMMENDATIONS 11/11/2015  Follow up Recommendations Skilled Nursing facility      [facility] IP FREQUENCY AND DURATION 11/05/2015  Speech Therapy Frequency (ACUTE ONLY) min 2x/week  Treatment Duration 1 week           CHL IP ORAL PHASE 11/11/2015  Oral Phase Impaired  Oral - Pudding Teaspoon --  Oral - Pudding Cup --  Oral - Honey Teaspoon --  Oral - Honey Cup --  Oral - Nectar Teaspoon --  Oral - Nectar Cup Incomplete tongue to palate contact;Holding of bolus;Lingual/palatal residue;Piecemeal swallowing  Oral - Nectar Straw --  Oral - Thin Teaspoon --  Oral - Thin Cup Incomplete tongue to palate contact;Reduced posterior propulsion;Holding of bolus;Lingual/palatal residue;Piecemeal swallowing;Decreased bolus cohesion  Oral - Thin Straw Premature spillage  Oral - Puree Lingual/palatal residue  Oral - Mech Soft --  Oral - Regular Lingual/palatal residue;Delayed oral transit  Oral - Multi-Consistency --  Oral - Pill Delayed oral transit  Oral Phase - Comment --    CHL IP PHARYNGEAL PHASE 11/11/2015  Pharyngeal Phase Impaired  Pharyngeal- Pudding Teaspoon --  Pharyngeal --  Pharyngeal- Pudding Cup --  Pharyngeal --  Pharyngeal- Honey Teaspoon --  Pharyngeal --  Pharyngeal- Honey Cup --  Pharyngeal --  Pharyngeal- Nectar Teaspoon --  Pharyngeal --  Pharyngeal- Nectar Cup Delayed swallow initiation-vallecula;Reduced epiglottic inversion;Reduced tongue base retraction;Pharyngeal residue - valleculae;Lateral  channel residue  Pharyngeal --  Pharyngeal- Nectar Straw --  Pharyngeal --  Pharyngeal- Thin Teaspoon --  Pharyngeal --  Pharyngeal- Thin Cup Delayed swallow initiation-vallecula;Delayed swallow initiation-pyriform sinuses;Reduced epiglottic inversion;Reduced tongue base retraction;Reduced airway/laryngeal closure;Penetration/Aspiration during swallow;Pharyngeal residue - valleculae;Lateral channel residue;Pharyngeal residue - pyriform  Pharyngeal Material enters airway, remains ABOVE vocal cords then ejected out;Material does not enter airway;Material enters airway, remains ABOVE vocal cords and not ejected out  Pharyngeal- Thin Straw Delayed swallow initiation-pyriform sinuses;Reduced epiglottic inversion;Reduced airway/laryngeal closure;Reduced tongue base retraction;Penetration/Aspiration during swallow;Pharyngeal residue - valleculae;Pharyngeal residue - pyriform;Lateral channel residue  Pharyngeal Material enters airway, remains ABOVE vocal cords then ejected out;Material enters airway, remains ABOVE vocal cords and not ejected out  Pharyngeal- Puree Delayed swallow initiation-vallecula;Reduced epiglottic inversion;Reduced tongue base retraction;Pharyngeal residue - valleculae;Compensatory strategies attempted (with notebox)  Pharyngeal --  Pharyngeal- Mechanical Soft --  Pharyngeal --  Pharyngeal- Regular Delayed swallow initiation-vallecula;Reduced epiglottic inversion  Pharyngeal --  Pharyngeal- Multi-consistency --  Pharyngeal --  Pharyngeal- Pill --  Pharyngeal --  Pharyngeal Comment --     CHL IP CERVICAL ESOPHAGEAL PHASE 11/11/2015  Cervical Esophageal Phase WFL  Pudding Teaspoon --  Pudding Cup --  Honey Teaspoon --  Honey Cup --  Nectar Teaspoon --  Nectar Cup --  Nectar Straw --  Thin Teaspoon --  Thin Cup --  Thin Straw --  Puree --  Mechanical Soft --  Regular --  Multi-consistency --  Pill --  Cervical Esophageal Comment --    No flowsheet data found.     Thank you,  Genene Churn, [place]  [place] 11/11/2015, 4:43 PM

## 2015-11-11 NOTE — Progress Notes (Signed)
Nutrition Follow-up  DOCUMENTATION CODES:  Not applicable  INTERVENTION:  Continue Ensure Enlive po BID, now at regular consistency, each supplement provides 350 kcal and 20 grams of protein.   NUTRITION DIAGNOSIS:  Inadequate oral intake related to lethargy/confusion as evidenced by Almost no intake in nearly 12 days.  Resolved  GOAL:  Patient will meet greater than or equal to 90% of their needs  MONITOR:  PO intake, Labs, I & O's, Weight trends  ASSESSMENT:  65 y/o male PMHx significant for COPD, HTN. Had not seen medical provider in >20 years. Initially presented with 3 month hx of SOB and LEE. Reported significant etoh and tobacco abuse.  Admitted for significant HTN, developed chf later. Hospital course complicated by significant alcohol withdrawal requiring sedation with precedex for ~5 days. May have aspirated on secretions 2/19.  Interval hx as of 2/24: Pt was cleared by speech therapy for po intake. He was initially placed on Dysphagia 3 with NTL. Has since been downgraded by separate SLP to HTL. AMS continues. Has required restraints.   Interval hx as of 2/28. Still w/ episodes of combativeness/agitation. MBSS completed today. Pt placed back on thin liquids  Per meal intake documentation, Pt has been eating >75%  Of his meals for the past 2.5 days. It appears that despite his continued AMS, he is able to maintain substantial PO intake  On RD arrival, pt seen awake, sitting in bed with glasses on. Wife was present and she reports that the pt continues to make process.  Now that pt is eating well, asked if she would like the Ensure to be stopped. Wife reports pt enjoys. Noted that sign at head of pt's bed indicated that pt had been cleared for TL and D3 diet per MBSS. Wife stated that she was not present during pt return from [facility] and could benefit from brief update by SLP. RD let SLP know of request.  did think he was overall improving.   No changes to intervention. Likely  pt will d/c to snf in near future.   Labs reviewed: Hypernatremic (resolved), Hyperglycemic, hypoalbuminemia.   Diet Order:  DIET DYS 3 Room service appropriate?: Yes; Fluid consistency:: Thin Diet - low sodium heart healthy  Skin: Rash on back, red/dry   Last BM:  2/27-incontinent  Height:  Ht Readings from Last 1 Encounters:  10/24/15 5\' 9"  (1.753 m)   Weight:  Wt Readings from Last 1 Encounters:  11/04/15 192 lb 3.9 oz (87.2 kg)   Ideal Body Weight:  72.7 kg  BMI:  Body mass index is 28.38 kg/(m^2).  Estimated Nutritional Needs:  Kcal:  1850-2100 (21-24 kcal/kg bw) Protein:  73-87 g (1-1.2 g/kg ibw) Fluid:  PER MD  EDUCATION NEEDS:  No education needs identified at this time  Burtis Junes RD, LDN Nutrition Pager: [phone] 11/11/2015 2:27 PM

## 2015-11-11 NOTE — Progress Notes (Signed)
Assisted to wheelchair for transport for ordered speech evaluation.  Gait slow and unsteady. Meds to be given on return to room.

## 2015-11-11 NOTE — Progress Notes (Signed)
Left via stretcher with [facility] for transport to [facility], [place].  Report called, wife present with patient.

## 2015-11-11 NOTE — Progress Notes (Signed)
Physical Therapy Treatment Patient Details Name: Jeff Miles MRN: [id number] DOB: 1951/08/12 Today's Date: 11/11/2015    History of Present Illness Patient is a 65 year old man who has not had any medical care in the past 20 years who presents to the hospital on 10/24/15  with a 3 month history of worsening shortness of breath or lower extremity edema. He visited an urgent care yesterday and was told he probably had pneumonia and advised him to come to the emergency department today. Denies any fevers or chills, states that the shortness of breath is worse with exertion, he smokes a pack and half a day and has done so for over 20 years, states he drinks about 6 beers a day, for the past 7 days he has had a runny nose and congestion as well as a nonproductive cough. In the emergency department he was found to be markedly hypertensive with an initial blood pressure of 205/134. Labs were essentially unremarkable with the exception of slight hemoconcentration with a hemoglobin of 17.5 chest x-ray showed mild chronic kidney changes but no acute findings. Initial troponin was normal at 0.03 and EKG was normal with the exception of sinus tachycardia with a rate of 111. Avonex for further evaluation and management.    PT Comments    Pt found standing with his RW and wife providing assistance upon arrival. Pt A&Ox3, but very eager to get out of his room. Overall, he is making improvements with his mobility, however his overall weakness, poor safety awareness and impulsive behavior continue to put him at a high risk of falls. Pt was able to ambulate for ~25 ft with RW for several trials, however he preferred to reach for the walls and other furniture in his room. He had several close falls with therapist providing minA to prevent full LOB. Therapist called nurse tech for assistance getting pt back in his bed. Alarm was set and his wife was encouraged to call for help if he attempts to walk out of the room  again. He would benefit from continued PT services to decrease caregiver burden and improve his safety and independence with activity.   Follow Up Recommendations  SNF     Equipment Recommendations  None recommended by PT    Recommendations for Other Services       Precautions / Restrictions Restrictions Weight Bearing Restrictions: No    Mobility  Bed Mobility Overal bed mobility: Modified Independent         Sit to supine: HOB elevated      Transfers Overall transfer level: Needs assistance Equipment used: Rolling walker (2 wheeled)   Sit to Stand: Min guard         General transfer comment: Pt removing gait belt and attempting to stand without AD; very minimal safety awareness  Ambulation/Gait Ambulation/Gait assistance: Min assist;Min guard Ambulation Distance (Feet): 25 Feet Assistive device: Rolling walker (2 wheeled) Gait Pattern/deviations: Decreased step length - right;Decreased step length - left;Drifts right/left (Pt refusing to use AD consistently, walking while holding onto the walls/other objects)   Gait velocity interpretation: <1.8 ft/sec, indicative of risk for recurrent falls General Gait Details: Pt with poor safety awareness and use of RW during ambulation, pushing the walker out in front of him. Therapist providing maximal cues with nurse tech assistance to steer walker. Pt also demonstrating difficulty initiating steps for 3 trials evident by several alternating steps in place.   Stairs            Wheelchair  Mobility    Modified Rankin (Stroke Patients Only)       Balance Overall balance assessment: Needs assistance Sitting-balance support: No upper extremity supported Sitting balance-Leahy Scale: Good     Standing balance support: No upper extremity supported Standing balance-Leahy Scale: Poor                      Cognition Arousal/Alertness: Awake/alert Behavior During Therapy: Impulsive Overall Cognitive  Status: Impaired/Different from baseline Area of Impairment: Safety/judgement Orientation Level: Disoriented to;Situation       Safety/Judgement: Decreased awareness of safety          Exercises General Exercises - Lower Extremity Ankle Circles/Pumps: Both;AROM;10 reps;Seated Long Arc Quad: AROM;Seated;Both;10 reps Hip ABduction/ADduction: AROM;Seated;5 reps;Both Hip Flexion/Marching: AROM;Seated;Both;5 reps    General Comments        Pertinent Vitals/Pain Pain Assessment: No/denies pain    Home Living                      Prior Function            PT Goals (current goals can now be found in the care plan section) Acute Rehab PT Goals Patient Stated Goal: none verbaized Time For Goal Achievement: 11/18/15 Potential to Achieve Goals: Good Progress towards PT goals: Progressing toward goals    Frequency  Min 3X/week    PT Plan Current plan remains appropriate    Co-evaluation             End of Session Equipment Utilized During Treatment: Gait belt Activity Tolerance: Treatment limited secondary to medical complications (Comment) Patient left: in bed;with call bell/phone within reach;with bed alarm set;with family/visitor present     Time: 1325-1350 PT Time Calculation (min) (ACUTE ONLY): 25 min  Charges:  $Therapeutic Exercise: 8-22 mins $Therapeutic Activity: 8-22 mins                    G Codes:        2:12 PM,11/20/15 Elly Modena PT, DPT [facility] Outpatient Physical Therapy [phone]

## 2016-08-17 ENCOUNTER — Ambulatory Visit: Payer: Commercial Managed Care - HMO

## 2016-08-17 ENCOUNTER — Ambulatory Visit (INDEPENDENT_AMBULATORY_CARE_PROVIDER_SITE_OTHER): Payer: BLUE CROSS/BLUE SHIELD | Admitting: Orthopedic Surgery

## 2016-08-17 ENCOUNTER — Ambulatory Visit (INDEPENDENT_AMBULATORY_CARE_PROVIDER_SITE_OTHER): Payer: Commercial Managed Care - HMO

## 2016-08-17 ENCOUNTER — Encounter: Payer: Self-pay | Admitting: Orthopedic Surgery

## 2016-08-17 VITALS — BP 140/88 | HR 98 | Wt 252.0 lb

## 2016-08-17 DIAGNOSIS — M545 Low back pain, unspecified: Secondary | ICD-10-CM

## 2016-08-17 DIAGNOSIS — M25561 Pain in right knee: Secondary | ICD-10-CM

## 2016-08-17 DIAGNOSIS — G8929 Other chronic pain: Secondary | ICD-10-CM

## 2016-08-17 DIAGNOSIS — M23321 Other meniscus derangements, posterior horn of medial meniscus, right knee: Secondary | ICD-10-CM

## 2016-08-17 NOTE — Progress Notes (Signed)
Patient ID: MITESH MATTERN, male   DOB: 1951/07/16, 65 y.o.   MRN: [id number]  Chief Complaint  Patient presents with  . Back Pain    RIGHT SIDED  . Knee Pain    RIGHT KNEE PAIN    HPI OWYNN THWAITES is a 65 y.o. male.  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   Respiratory: Negative.   Cardiovascular: Negative.   Gastrointestinal: Negative.      Past Medical History:  Diagnosis Date  . COPD (chronic obstructive pulmonary disease) ([place])   . Hypertension     Past Surgical History:  Procedure Laterality Date  . BACK SURGERY    . INNER EAR SURGERY    . NOSE SURGERY      Social History Social History  Substance Use Topics  . Smoking status: Former Research scientist (life sciences)  . Smokeless tobacco: Never Used  . Alcohol use Yes     Comment: "6 beers a day"    No Known Allergies  No outpatient prescriptions have been marked as taking for the 08/17/16 encounter (Office Visit) with Carole Civil, MD.      Physical Exam Physical Exam BP 140/88   Pulse 98   Wt 252 lb (114.3 kg)   BMI 37.21 kg/m   Gen. appearance. The patient is well-developed and well-nourished, grooming and hygiene are normal. There are no gross congenital abnormalities  The patient is alert and oriented to person place and time  Mood and affect are normal  Ambulation Favors right lower extremity with noticeable limp  Examination reveals the following: On inspection we find normal leg lengths. Tenderness over the right greater trochanter and medial joint line of the right knee  With the range of motion of  right knee normal  Stability tests were normal  right knee anterior cruciate ligament PCL collateral ligaments  Strength tests revealed grade 5 motor strength  right quadriceps hamstrings plantar and dorsiflexors  Skin we find no rash ulceration or erythema right leg  Sensation remains intact right leg  Impression vascular system shows no peripheral edema  Data Reviewed Plain films right knee look relatively normal with mild arthritis of the medial compartment  Lumbar spine x-ray shows coronal plane malalignment degenerative disc changes in the L4-5, L5-S1 discs most likely related to previous surgery  Assessment    Encounter Diagnoses  Name Primary?  . Chronic pain of right knee Yes  . Chronic right-sided low back pain without sciatica   . Derangement of posterior horn of medial meniscus of right knee        Plan    Inject right knee Scheduled physical therapy Return 6 weeks       [name] 08/17/2016, 10:44 AM

## 2016-08-17 NOTE — Patient Instructions (Signed)
You have received an injection of steroids into the joint. 15% of patients will have increased pain within the 24 hours postinjection.   This is transient and will go away.   We recommend that you use ice packs on the injection site for 20 minutes every 2 hours and extra strength Tylenol 2 tablets every 8 as needed until the pain resolves.  If you continue to have pain after taking the Tylenol and using the ice please call the office for further instructions.   Use cane left hand  Start physical therapy

## 2016-08-24 ENCOUNTER — Ambulatory Visit (HOSPITAL_COMMUNITY): Payer: BLUE CROSS/BLUE SHIELD | Attending: Orthopedic Surgery | Admitting: Physical Therapy

## 2016-08-24 ENCOUNTER — Encounter (HOSPITAL_COMMUNITY): Payer: Self-pay | Admitting: Physical Therapy

## 2016-08-24 DIAGNOSIS — R2689 Other abnormalities of gait and mobility: Secondary | ICD-10-CM | POA: Diagnosis present

## 2016-08-24 DIAGNOSIS — M25561 Pain in right knee: Secondary | ICD-10-CM | POA: Insufficient documentation

## 2016-08-24 DIAGNOSIS — Z9181 History of falling: Secondary | ICD-10-CM | POA: Insufficient documentation

## 2016-08-24 NOTE — Therapy (Signed)
[name] [name], [name], [address] Phone: [phone]   Fax:  [phone]  Physical Therapy Evaluation  Patient Details  Name: Jeff Miles MRN: [id number] Date of Birth: [birth year] Referring Provider: [name], MD  Encounter Date: 08/24/2016      PT End of Session - 08/24/16 0928    Visit Number 1   Number of Visits 9   Authorization Type BCBS   Authorization Time Period 08/24/16 to 10/06/16   PT Start Time 0816   PT Stop Time 0857   PT Time Calculation (min) 41 min   Activity Tolerance Patient tolerated treatment well;No increased pain   Behavior During Therapy WFL for tasks assessed/performed      Past Medical History:  Diagnosis Date  . COPD (chronic obstructive pulmonary disease) ([place])   . Hypertension     Past Surgical History:  Procedure Laterality Date  . BACK SURGERY    . INNER EAR SURGERY    . NOSE SURGERY      There were no vitals filed for this visit.       Subjective Assessment - 08/24/16 0819    Subjective Pt reports that his knee has been bothering him for atleast 3 months. He has fallen several times, stating he was in the hospital back in Feb for COPD/CHF. He has fallen several times since then specifically one day walking down the ramp to his shop and he fell foward twisting his knee. He reports that his knee will pop/click every now and then. Since then his knee has gotten progressively worse. He saw Dr. [name] who states that he needs atleast 4 weeks of PT prior to an MRI.  He just recently started to use a SPC for balance and pain.    Patient is accompained by: Family member   Pertinent History Low back surgery x2, COPD, HTN, CHF   Limitations Walking   How long can you sit comfortably? unlimited    How long can you stand comfortably? 15-20 minutes    How long can you walk comfortably? 2-3 minutes    Diagnostic tests Xray: knee mild OA, lumbar severe spondylosis   Patient Stated Goals improve pain.    Currently in Pain? Yes   Pain Location Knee   Pain Orientation Right;Medial   Pain Descriptors / Indicators Aching   Pain Type Other (Comment)  acute on chronic   Pain Radiating Towards none    Pain Onset More than a month ago   Pain Frequency Intermittent   Aggravating Factors  standing/walking/squatting   Pain Relieving Factors sitting, resting.             [name] PT Assessment - 08/24/16 0001      Assessment   Medical Diagnosis Chronic Rt knee pain   Referring Provider [name], MD   Onset Date/Surgical Date --  ~ 3 months ago.   Prior Therapy none      Precautions   Precautions None     Balance Screen   Has the patient fallen in the past 6 months Yes   How many times? 1   Has the patient had a decrease in activity level because of a fear of falling?  Yes   Is the patient reluctant to leave their home because of a fear of falling?  No     Home Environment   Living Environment Private residence   Additional Comments 2 STE     Prior Function  Level of Independence Independent     Cognition   Overall Cognitive Status History of cognitive impairments - at baseline     Sensation   Light Touch Appears Intact     ROM / Strength   AROM / PROM / Strength AROM;Strength     AROM   AROM Assessment Site Knee  BLE WNL     Strength   Strength Assessment Site Hip;Knee;Ankle   Right/Left Hip Right;Left   Right Hip Flexion 5/5   Right Hip Extension 3+/5   Right Hip ABduction 3/5   Left Hip Flexion 5/5   Left Hip Extension 3+/5   Left Hip ABduction 3+/5   Right/Left Knee Right;Left   Right Knee Flexion 4+/5   Right Knee Extension 5/5   Left Knee Flexion 4+/5   Left Knee Extension 5/5   Right/Left Ankle Left;Right   Right Ankle Dorsiflexion 5/5   Left Ankle Dorsiflexion 5/5     Palpation   Patella mobility WNL   Palpation comment TTP Rt medial jt line     Special Tests    Special Tests Meniscus Tests    Meniscus Tests McMurray Test;other     McMurray Test   Findings Positive   Side Right     other   Side Right   Comments LCL/MCL testing BLE: RLE MCL reproduced pain      Transfers   Five time sit to stand comments  22 sec, no UE     Ambulation/Gait   Gait Comments Using SPC in Lt hand, decreased stance time on Rt, decreased step length Lt      Standardized Balance Assessment   Standardized Balance Assessment Timed Up and Go Test     Timed Up and Go Test   TUG Comments 20 sec without AD                   [facility] Adult PT Treatment/Exercise - 08/24/16 0001      Exercises   Exercises Knee/Hip     Knee/Hip Exercises: Standing   Other Standing Knee Exercises side stepping with UE support on wall 2-3 steps each direction x2 trials.      Knee/Hip Exercises: Supine   Bridges 1 set;10 reps;Both                PT Education - 08/24/16 0908    Education provided Yes   Education Details eval findings/POC; possibility of cartilage pathology based on symptoms and presentation.   Person(s) Educated Patient;Spouse   Methods Explanation;Demonstration;Handout   Comprehension Verbalized understanding;Returned demonstration          PT Short Term Goals - 08/24/16 1341      PT SHORT TERM GOAL #1   Title Pt will demo consistency and independence with his HEP to improve strength and balance.    Time 2   Period Weeks   Status New           PT Long Term Goals - 08/24/16 1341      PT LONG TERM GOAL #1   Title Pt will demo improved BLE strength to atleast 4/5 MMT to improve his functional strength and safety with activity at home.    Time 4   Period Weeks   Status New     PT LONG TERM GOAL #2   Title Pt will perform 5x sit to stand in less than 14 sec, to indicate improvements in functional strength.    Time 4   Period Weeks  Status New     PT LONG TERM GOAL #3   Title Pt will perform TUG in less than 14 sec with LRAD, to indicate he is at a decreased  risk of falling out in the community.   Time 4   Period Weeks   Status New     PT LONG TERM GOAL #4   Title Pt will maintain SLS on each LE for up to 10 sec, 2/3 trials, to decrease risk of falling at home.    Time 4   Period Weeks   Status New               Plan - 08/24/16 1330    Clinical Impression Statement Pt is a 65yo M referred to OPPT for evaluation of Rt knee pain ongoing over the past 3-4 months following a fall in his back yard. He presents with medial knee joint line tenderness and reproduction of pain with McMurray's and varus testing. He also demonstrates limitations in general conditioning, hip weakness and poor balance impairing his functional performance during sit to stand and TUG testing. I reviewed eval findings and POC with the pt and his wife who verbalized understanding and agreement with proposed PT frequency. HEP was provided to address areas of limitation and pt was able to return demonstration of proper technique. He would benefit from skilled PT to address his limitations above and to improve his safety and independence with daily activity at home and in the community.   Rehab Potential Good   PT Frequency 2x / week   PT Duration 4 weeks   PT Treatment/Interventions ADLs/Self Care Home Management;Cryotherapy;Gait training;Stair training;Balance training;Neuromuscular re-education;Functional mobility training;Therapeutic activities;Therapeutic exercise;Orthotic Fit/Training;Patient/family education;Manual techniques   PT Next Visit Plan hip extensor and abductor strengthening; static balance activity    PT Home Exercise Plan side stepping x3 reps at kitchen, supine BLE bridge 2x10, SLS 3x30 sec    Consulted and Agree with Plan of Care Patient;Family member/caregiver   Family Member Consulted wife       Patient will benefit from skilled therapeutic intervention in order to improve the following deficits and impairments:  Abnormal gait, Decreased balance,  Decreased endurance, Decreased strength, Impaired flexibility, Pain, Difficulty walking  Visit Diagnosis: Right knee pain, unspecified chronicity - Plan: PT plan of care cert/re-cert  Other abnormalities of gait and mobility - Plan: PT plan of care cert/re-cert  History of falling - Plan: PT plan of care cert/re-cert     Problem List Patient Active Problem List   Diagnosis Date Noted  . Aspiration into airway   . Acute on chronic diastolic (congestive) heart failure   . Restlessness and agitation   . 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 Bronchospasm 10/24/2015  . COPD exacerbation ([place]) 10/24/2015  . Hypoxemia 10/24/2015  . URI (upper respiratory infection) 10/24/2015  . Hypertensive urgency 10/24/2015    3:19 PM,08/24/16 Elly Modena PT, DPT [facility] [address] [place], [name], [address] Phone: [phone]   Fax:  [phone]  Name: JAVANTE FORONDA MRN: [id number] Date of Birth: Apr 12, 1951

## 2016-08-26 ENCOUNTER — Ambulatory Visit (HOSPITAL_COMMUNITY): Payer: BLUE CROSS/BLUE SHIELD | Admitting: Physical Therapy

## 2016-08-26 DIAGNOSIS — M25561 Pain in right knee: Secondary | ICD-10-CM | POA: Diagnosis not present

## 2016-08-26 DIAGNOSIS — Z9181 History of falling: Secondary | ICD-10-CM

## 2016-08-26 DIAGNOSIS — R2689 Other abnormalities of gait and mobility: Secondary | ICD-10-CM

## 2016-08-26 NOTE — Therapy (Signed)
[facility] [place], [name], [address] Phone: [phone]   Fax:  [phone]  Physical Therapy Treatment  Patient Details  Name: Jeff Miles MRN: [id number] Date of Birth: [birth year] Referring Provider: [name], MD  Encounter Date: 08/26/2016      PT End of Session - 08/26/16 1158    Visit Number 2   Number of Visits 9   Authorization Type BCBS   Authorization Time Period 08/24/16 to 10/06/16   Authorization - Visit Number 2   Authorization - Number of Visits 9   PT Start Time [id number]   PT Stop Time [id number]   PT Time Calculation (min) 42 min   Activity Tolerance Patient tolerated treatment well;No increased pain   Behavior During Therapy WFL for tasks assessed/performed      Past Medical History:  Diagnosis Date  . COPD (chronic obstructive pulmonary disease) ([place])   . Hypertension     Past Surgical History:  Procedure Laterality Date  . BACK SURGERY    . INNER EAR SURGERY    . NOSE SURGERY      There were no vitals filed for this visit.      Subjective Assessment - 08/26/16 1119    Subjective Pt reports that his knee has been bothering him for atleast 3 months. He has fallen several times, stating he was in the hospital back in Feb for COPD/CHF. He has fallen several times since then specifically one day walking down the ramp to his shop and he fell foward twisting his knee. He reports that his knee will pop/click every now and then. Since then his knee has gotten progressively worse. He saw Dr. [name] who states that he needs atleast 4 weeks of PT prior to an MRI.  He just recently started to use a SPC for balance and pain.    Patient is accompained by: Family member   Pertinent History Low back surgery x2, COPD, HTN, CHF   Limitations Walking   How long can you sit comfortably? unlimited    How long can you stand comfortably? 15-20 minutes    How long can you walk comfortably? 2-3 minutes     Diagnostic tests Xray: knee mild OA, lumbar severe spondylosis   Patient Stated Goals improve pain.    Currently in Pain? Yes   Pain Score 2    Pain Location Knee   Pain Orientation Right   Pain Descriptors / Indicators Aching   Pain Type Acute pain   Pain Onset More than a month ago                         [facility] Adult PT Treatment/Exercise - 08/26/16 0001      Exercises   Exercises Knee/Hip     Knee/Hip Exercises: Standing   Heel Raises 10 reps   Functional Squat 10 reps   Other Standing Knee Exercises Rt hip Ab/extension with 2# x 10 each; attempted Lt but increased RT knee pain    Other Standing Knee Exercises tandem stance x 5 with Rt and LT forward; Narrow base of support with head turns      Knee/Hip Exercises: Supine   Quad Sets Both;10 reps     Knee/Hip Exercises: Sidelying   Hip ABduction Strengthening;Both;10 reps;Limitations   Hip ABduction Limitations 2#      Knee/Hip Exercises: Prone   Hip Extension Strengthening;Both;10 reps  PT Education - 08/26/16 1157    Education provided Yes   Education Details HEP   Person(s) Educated Patient   Methods Explanation   Comprehension Verbalized understanding          PT Short Term Goals - 08/26/16 1200      PT SHORT TERM GOAL #1   Title Pt will demo consistency and independence with his HEP to improve strength and balance.    Time 2   Period Weeks   Status On-going           PT Long Term Goals - 08/26/16 1201      PT LONG TERM GOAL #1   Title Pt will demo improved BLE strength to atleast 4/5 MMT to improve his functional strength and safety with activity at home.    Time 4   Period Weeks   Status On-going     PT LONG TERM GOAL #2   Title Pt will perform 5x sit to stand in less than 14 sec, to indicate improvements in functional strength.    Time 4   Period Weeks   Status On-going     PT LONG TERM GOAL #3   Title Pt will perform TUG in less than 14 sec  with LRAD, to indicate he is at a decreased risk of falling out in the community.   Time 4   Period Weeks   Status On-going     PT LONG TERM GOAL #4   Title Pt will maintain SLS on each LE for up to 10 sec, 2/3 trials, to decrease risk of falling at home.    Time 4   Period Weeks   Status On-going               Plan - 08/26/16 1158    Clinical Impression Statement Reviewed evaluation and goals with patient.  Pt instructed in new exercises with verbal and manual cuing to ensure pt performs exercises correctly.  PT unable to hear out of his Lt ear.     Rehab Potential Good   PT Frequency 2x / week   PT Duration 4 weeks   PT Treatment/Interventions ADLs/Self Care Home Management;Cryotherapy;Gait training;Stair training;Balance training;Neuromuscular re-education;Functional mobility training;Therapeutic activities;Therapeutic exercise;Orthotic Fit/Training;Patient/family education;Manual techniques   PT Next Visit Plan begin sit to stand continue with balance activity, avoid standing on Rt LE only as this increases knee pain    PT Home Exercise Plan side stepping x3 reps at kitchen, supine BLE bridge 2x10, SLS 3x30 sec ; heel raises; squats, quad sets, hip abduction and extension    Consulted and Agree with Plan of Care Patient;Family member/caregiver   Family Member Consulted wife       Patient will benefit from skilled therapeutic intervention in order to improve the following deficits and impairments:  Abnormal gait, Decreased balance, Decreased endurance, Decreased strength, Impaired flexibility, Pain, Difficulty walking  Visit Diagnosis: Right knee pain, unspecified chronicity  Other abnormalities of gait and mobility  History of falling     Problem List Patient Active Problem List   Diagnosis Date Noted  . Aspiration into airway   . Acute on chronic diastolic (congestive) heart failure   . Restlessness and agitation   . 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  Bronchospasm 10/24/2015  . COPD exacerbation ([place]) 10/24/2015  . Hypoxemia 10/24/2015  . URI (upper respiratory infection) 10/24/2015  . Hypertensive urgency 10/24/2015    Rayetta Humphrey, PT CLT [phone] 08/26/2016, 12:01 PM  [facility]  [place], [name], [address] Phone: [phone]   Fax:  [phone]  Name: Jeff Miles MRN: [id number] Date of Birth: 31-May-1951

## 2016-08-26 NOTE — Patient Instructions (Addendum)
Strengthening: Quadriceps Set    Tighten muscles on top of thighs by pushing knees down into surface. Hold _3___ seconds. Repeat 10____ times per set. Do __1__ sets per session. Do _2__ sessions per day.  http://orth.exer.us/602   Copyright  VHI. All rights reserved.  Strengthening: Hip Abduction (Side-Lying)    Tighten muscles on front of left thigh, then lift leg _10__ inches from surface, keeping knee locked.  Repeat _10___ times per set. Do __1__ sets per session. Do _2___ sessions per day. Repeat with right http://orth.exer.us/622   Copyright  VHI. All rights reserved.  Straight Leg Raise (Prone)    Abdomen and head supported, keep left knee locked and raise leg at hip. Avoid arching low back. Repeat _10___ times per set. Do __1__ sets per session. Do 2____ sessions per day.  http://orth.exer.us/1112   Copyright  VHI. All rights reserved.  Heel Raise: Bilateral (Standing)    Rise on balls of feet. Repeat __10__ times per set. Do __1__ sets per session. Do __2__ sessions per day.  http://orth.exer.us/38   Copyright  VHI. All rights reserved.  Functional Quadriceps: Chair Squat    Keeping feet flat on floor, shoulder width apart, squat as low as is comfortable. Use support as necessary. Repeat ____ times per set. Do ____ sets per session. Do ____ sessions per day.  http://orth.exer.us/736   Copyright  VHI. All rights reserved.  Functional Quadriceps: Chair Squat    Keeping feet flat on floor, shoulder width apart, squat as low as is comfortable. Use support as necessary. Repeat _10___ times per set. Do 1____ sets per session. Do ___2_ sessions per day.  http://orth.exer.us/736   Copyright  VHI. All rights reserved.

## 2016-08-31 ENCOUNTER — Ambulatory Visit (HOSPITAL_COMMUNITY): Payer: BLUE CROSS/BLUE SHIELD

## 2016-08-31 DIAGNOSIS — M25561 Pain in right knee: Secondary | ICD-10-CM | POA: Diagnosis not present

## 2016-08-31 DIAGNOSIS — Z9181 History of falling: Secondary | ICD-10-CM

## 2016-08-31 DIAGNOSIS — R2689 Other abnormalities of gait and mobility: Secondary | ICD-10-CM

## 2016-08-31 NOTE — Therapy (Addendum)
[place], [name], [address] Phone: [phone]   Fax:  [phone]  Physical Therapy Treatment/Discharge  Patient Details  Name: Jeff Miles MRN: [id number] Date of Birth: 02/18/1951 Referring Provider: [name], MD  Encounter Date: 08/31/2016      PT End of Session - 08/31/16 1450    Visit Number 3   Number of Visits 9   Authorization Type BCBS   Authorization Time Period 08/24/16 to 10/06/16   Authorization - Visit Number 3   Authorization - Number of Visits 9   PT Start Time 8032   PT Stop Time 1224   PT Time Calculation (min) 47 min   Activity Tolerance Patient tolerated treatment well;No increased pain   Behavior During Therapy WFL for tasks assessed/performed      Past Medical History:  Diagnosis Date  . COPD (chronic obstructive pulmonary disease) ([place])   . Hypertension     Past Surgical History:  Procedure Laterality Date  . BACK SURGERY    . INNER EAR SURGERY    . NOSE SURGERY      There were no vitals filed for this visit.      Subjective Assessment - 08/31/16 1422    Subjective Pt stated he has high pain scale Rt knee with stabbing pain.  Reports he has not been as compliant with HEP but is working towards increasing frequency.     Patient is accompained by: Family member   Pertinent History Low back surgery x2, COPD, HTN, CHF   Patient Stated Goals improve pain.    Currently in Pain? Yes   Pain Score 5    Pain Location Knee   Pain Orientation Right   Pain Descriptors / Indicators Aching;Stabbing   Pain Type Acute pain   Pain Onset More than a month ago   Pain Frequency Intermittent   Aggravating Factors  standing/walking/squatting   Pain Relieving Factors sitting, resting       Heel and toe raises 15x      [facility] Adult PT Treatment/Exercise - 08/31/16 0001      Knee/Hip Exercises: Standing   Functional Squat 15 reps   Rocker Board 2 minutes   Rocker  Board Limitations R/L and A/P   Other Standing Knee Exercises tandem stance 3x 30:"; NBOS on Airex with 10 head turns     Knee/Hip Exercises: Seated   Sit to Sand 10 reps;without UE support     Knee/Hip Exercises: Supine   Bridges 15 reps     Knee/Hip Exercises: Sidelying   Hip ABduction Both;15 reps   Hip ABduction Limitations 2#, cueing for form     Knee/Hip Exercises: Prone   Hip Extension Strengthening;Both;15 reps                  PT Short Term Goals - 08/26/16 1200      PT SHORT TERM GOAL #1   Title Pt will demo consistency and independence with his HEP to improve strength and balance.    Time 2   Period Weeks   Status On-going           PT Long Term Goals - 08/26/16 1201      PT LONG TERM GOAL #1   Title Pt will demo improved BLE strength to atleast 4/5 MMT to improve his functional strength and safety with activity at home.    Time 4   Period Weeks   Status On-going  PT LONG TERM GOAL #2   Title Pt will perform 5x sit to stand in less than 14 sec, to indicate improvements in functional strength.    Time 4   Period Weeks   Status On-going     PT LONG TERM GOAL #3   Title Pt will perform TUG in less than 14 sec with LRAD, to indicate he is at a decreased risk of falling out in the community.   Time 4   Period Weeks   Status On-going     PT LONG TERM GOAL #4   Title Pt will maintain SLS on each LE for up to 10 sec, 2/3 trials, to decrease risk of falling at home.    Time 4   Period Weeks   Status On-going               Plan - 08/31/16 1836    Clinical Impression Statement Session focus on proximal LE strengthening and balance activities.  Able to increase reps with mat and standing activities with min cueing for proper form for correct mm activation.  Pt able to complete all exercises with no reports of increased pain.  Added sit to stand for functional strengthening.  Balance activities complete with min A for safety and cueing to  improve posture to assist iwth balance activities.     Rehab Potential Good   PT Frequency 2x / week   PT Duration 4 weeks   PT Treatment/Interventions ADLs/Self Care Home Management;Cryotherapy;Gait training;Stair training;Balance training;Neuromuscular re-education;Functional mobility training;Therapeutic activities;Therapeutic exercise;Orthotic Fit/Training;Patient/family education;Manual techniques   PT Next Visit Plan Continue functional strengthening and balance activities, avoid standing on Rt LE only as this increases knee pain       Patient will benefit from skilled therapeutic intervention in order to improve the following deficits and impairments:  Abnormal gait, Decreased balance, Decreased endurance, Decreased strength, Impaired flexibility, Pain, Difficulty walking  Visit Diagnosis: Right knee pain, unspecified chronicity  Other abnormalities of gait and mobility  History of falling     Problem List Patient Active Problem List   Diagnosis Date Noted  . Aspiration into airway   . Acute on chronic diastolic (congestive) heart failure   . Restlessness and agitation   . 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 Bronchospasm 10/24/2015  . COPD exacerbation ([place]) 10/24/2015  . Hypoxemia 10/24/2015  . URI (upper respiratory infection) 10/24/2015  . Hypertensive urgency 10/24/2015   Ihor Austin, [place]; [place]  Aldona Lento 08/31/2016, 6:41 PM  [place], [name], [address] Phone: [phone]   Fax:  [phone]  Name: Jeff Miles MRN: [id number] Date of Birth: 29-Jul-1951    PHYSICAL THERAPY DISCHARGE SUMMARY  Visits from Start of Care: 3  Current functional level related to goals / functional outcomes: See treatment note above for information regarding most current functional level.   Remaining deficits: See treatment note above for more information regarding  remaining deficits in function.    Education / Equipment: See above for education provided at pt's most recent PT appointment.  Plan: Patient agrees to discharge.  Patient goals were not met. Patient is being discharged due to financial reasons.  ?????    Pt currently unable to afford co-pays and requesting discharge from services.   9:07 AM,09/20/16 [place] [phone]

## 2016-09-01 ENCOUNTER — Telehealth (HOSPITAL_COMMUNITY): Payer: Self-pay | Admitting: Internal Medicine

## 2016-09-01 NOTE — Telephone Encounter (Signed)
09/01/16 wife said they needed to cx the 12/21 appt but no reason was given

## 2016-09-02 ENCOUNTER — Ambulatory Visit (HOSPITAL_COMMUNITY): Payer: BLUE CROSS/BLUE SHIELD

## 2016-09-14 ENCOUNTER — Ambulatory Visit (HOSPITAL_COMMUNITY): Payer: BLUE CROSS/BLUE SHIELD

## 2016-09-14 ENCOUNTER — Telehealth (HOSPITAL_COMMUNITY): Payer: Self-pay

## 2016-09-14 NOTE — Telephone Encounter (Signed)
Wife states they can not afford the copay this week and they don't want to make a bill

## 2016-09-16 ENCOUNTER — Ambulatory Visit (HOSPITAL_COMMUNITY): Payer: BLUE CROSS/BLUE SHIELD | Admitting: Physical Therapy

## 2016-09-20 ENCOUNTER — Ambulatory Visit (HOSPITAL_COMMUNITY): Payer: BLUE CROSS/BLUE SHIELD | Admitting: Physical Therapy

## 2016-09-20 ENCOUNTER — Telehealth (HOSPITAL_COMMUNITY): Payer: Self-pay | Admitting: Internal Medicine

## 2016-09-20 NOTE — Telephone Encounter (Signed)
09/20/16 wife called and asked that all appts be cx because money was the issue

## 2016-09-22 ENCOUNTER — Ambulatory Visit (HOSPITAL_COMMUNITY): Payer: BLUE CROSS/BLUE SHIELD

## 2016-09-27 ENCOUNTER — Ambulatory Visit (HOSPITAL_COMMUNITY): Payer: BLUE CROSS/BLUE SHIELD | Admitting: Physical Therapy

## 2016-09-28 ENCOUNTER — Ambulatory Visit: Payer: BLUE CROSS/BLUE SHIELD | Admitting: Orthopedic Surgery

## 2016-09-29 ENCOUNTER — Encounter (HOSPITAL_COMMUNITY): Payer: BLUE CROSS/BLUE SHIELD

## 2016-10-04 ENCOUNTER — Encounter (HOSPITAL_COMMUNITY): Payer: BLUE CROSS/BLUE SHIELD | Admitting: Physical Therapy

## 2016-12-21 DIAGNOSIS — E785 Hyperlipidemia, unspecified: Secondary | ICD-10-CM | POA: Diagnosis not present

## 2016-12-21 DIAGNOSIS — Z125 Encounter for screening for malignant neoplasm of prostate: Secondary | ICD-10-CM | POA: Diagnosis not present

## 2016-12-21 DIAGNOSIS — I10 Essential (primary) hypertension: Secondary | ICD-10-CM | POA: Diagnosis not present

## 2016-12-21 DIAGNOSIS — R7301 Impaired fasting glucose: Secondary | ICD-10-CM | POA: Diagnosis not present

## 2016-12-28 DIAGNOSIS — F0390 Unspecified dementia without behavioral disturbance: Secondary | ICD-10-CM | POA: Diagnosis not present

## 2016-12-28 DIAGNOSIS — M25569 Pain in unspecified knee: Secondary | ICD-10-CM | POA: Diagnosis not present

## 2016-12-28 DIAGNOSIS — I5033 Acute on chronic diastolic (congestive) heart failure: Secondary | ICD-10-CM | POA: Diagnosis not present

## 2016-12-28 DIAGNOSIS — E1165 Type 2 diabetes mellitus with hyperglycemia: Secondary | ICD-10-CM | POA: Diagnosis not present

## 2016-12-28 DIAGNOSIS — E785 Hyperlipidemia, unspecified: Secondary | ICD-10-CM | POA: Diagnosis not present

## 2016-12-28 DIAGNOSIS — F5101 Primary insomnia: Secondary | ICD-10-CM | POA: Diagnosis not present

## 2016-12-28 DIAGNOSIS — J449 Chronic obstructive pulmonary disease, unspecified: Secondary | ICD-10-CM | POA: Diagnosis not present

## 2016-12-28 DIAGNOSIS — F1026 Alcohol dependence with alcohol-induced persisting amnestic disorder: Secondary | ICD-10-CM | POA: Diagnosis not present

## 2016-12-28 DIAGNOSIS — M25559 Pain in unspecified hip: Secondary | ICD-10-CM | POA: Diagnosis not present

## 2017-01-11 DIAGNOSIS — J441 Chronic obstructive pulmonary disease with (acute) exacerbation: Secondary | ICD-10-CM | POA: Diagnosis not present

## 2017-01-11 DIAGNOSIS — R110 Nausea: Secondary | ICD-10-CM | POA: Diagnosis not present

## 2017-07-04 DIAGNOSIS — E1165 Type 2 diabetes mellitus with hyperglycemia: Secondary | ICD-10-CM | POA: Diagnosis not present

## 2017-07-04 DIAGNOSIS — E782 Mixed hyperlipidemia: Secondary | ICD-10-CM | POA: Diagnosis not present

## 2017-07-06 DIAGNOSIS — G4700 Insomnia, unspecified: Secondary | ICD-10-CM | POA: Diagnosis not present

## 2017-07-06 DIAGNOSIS — E1165 Type 2 diabetes mellitus with hyperglycemia: Secondary | ICD-10-CM | POA: Diagnosis not present

## 2017-07-06 DIAGNOSIS — F1026 Alcohol dependence with alcohol-induced persisting amnestic disorder: Secondary | ICD-10-CM | POA: Diagnosis not present

## 2017-07-06 DIAGNOSIS — I509 Heart failure, unspecified: Secondary | ICD-10-CM | POA: Diagnosis not present

## 2017-07-06 DIAGNOSIS — F0390 Unspecified dementia without behavioral disturbance: Secondary | ICD-10-CM | POA: Diagnosis not present

## 2017-07-06 DIAGNOSIS — E785 Hyperlipidemia, unspecified: Secondary | ICD-10-CM | POA: Diagnosis not present

## 2017-07-06 DIAGNOSIS — F1010 Alcohol abuse, uncomplicated: Secondary | ICD-10-CM | POA: Diagnosis not present

## 2017-07-06 DIAGNOSIS — R7301 Impaired fasting glucose: Secondary | ICD-10-CM | POA: Diagnosis not present

## 2017-07-06 DIAGNOSIS — Z23 Encounter for immunization: Secondary | ICD-10-CM | POA: Diagnosis not present

## 2017-07-06 DIAGNOSIS — M25561 Pain in right knee: Secondary | ICD-10-CM | POA: Diagnosis not present

## 2017-07-10 IMAGING — DX DG CHEST 2V
2 series · 2 of 2 positions shown · non-contrast
Comparison: 11/25/2004

CLINICAL DATA: Productive cough for several months with some
shortness of breath, patient smokes

EXAM:
CHEST  2 VIEW

[chest pa]
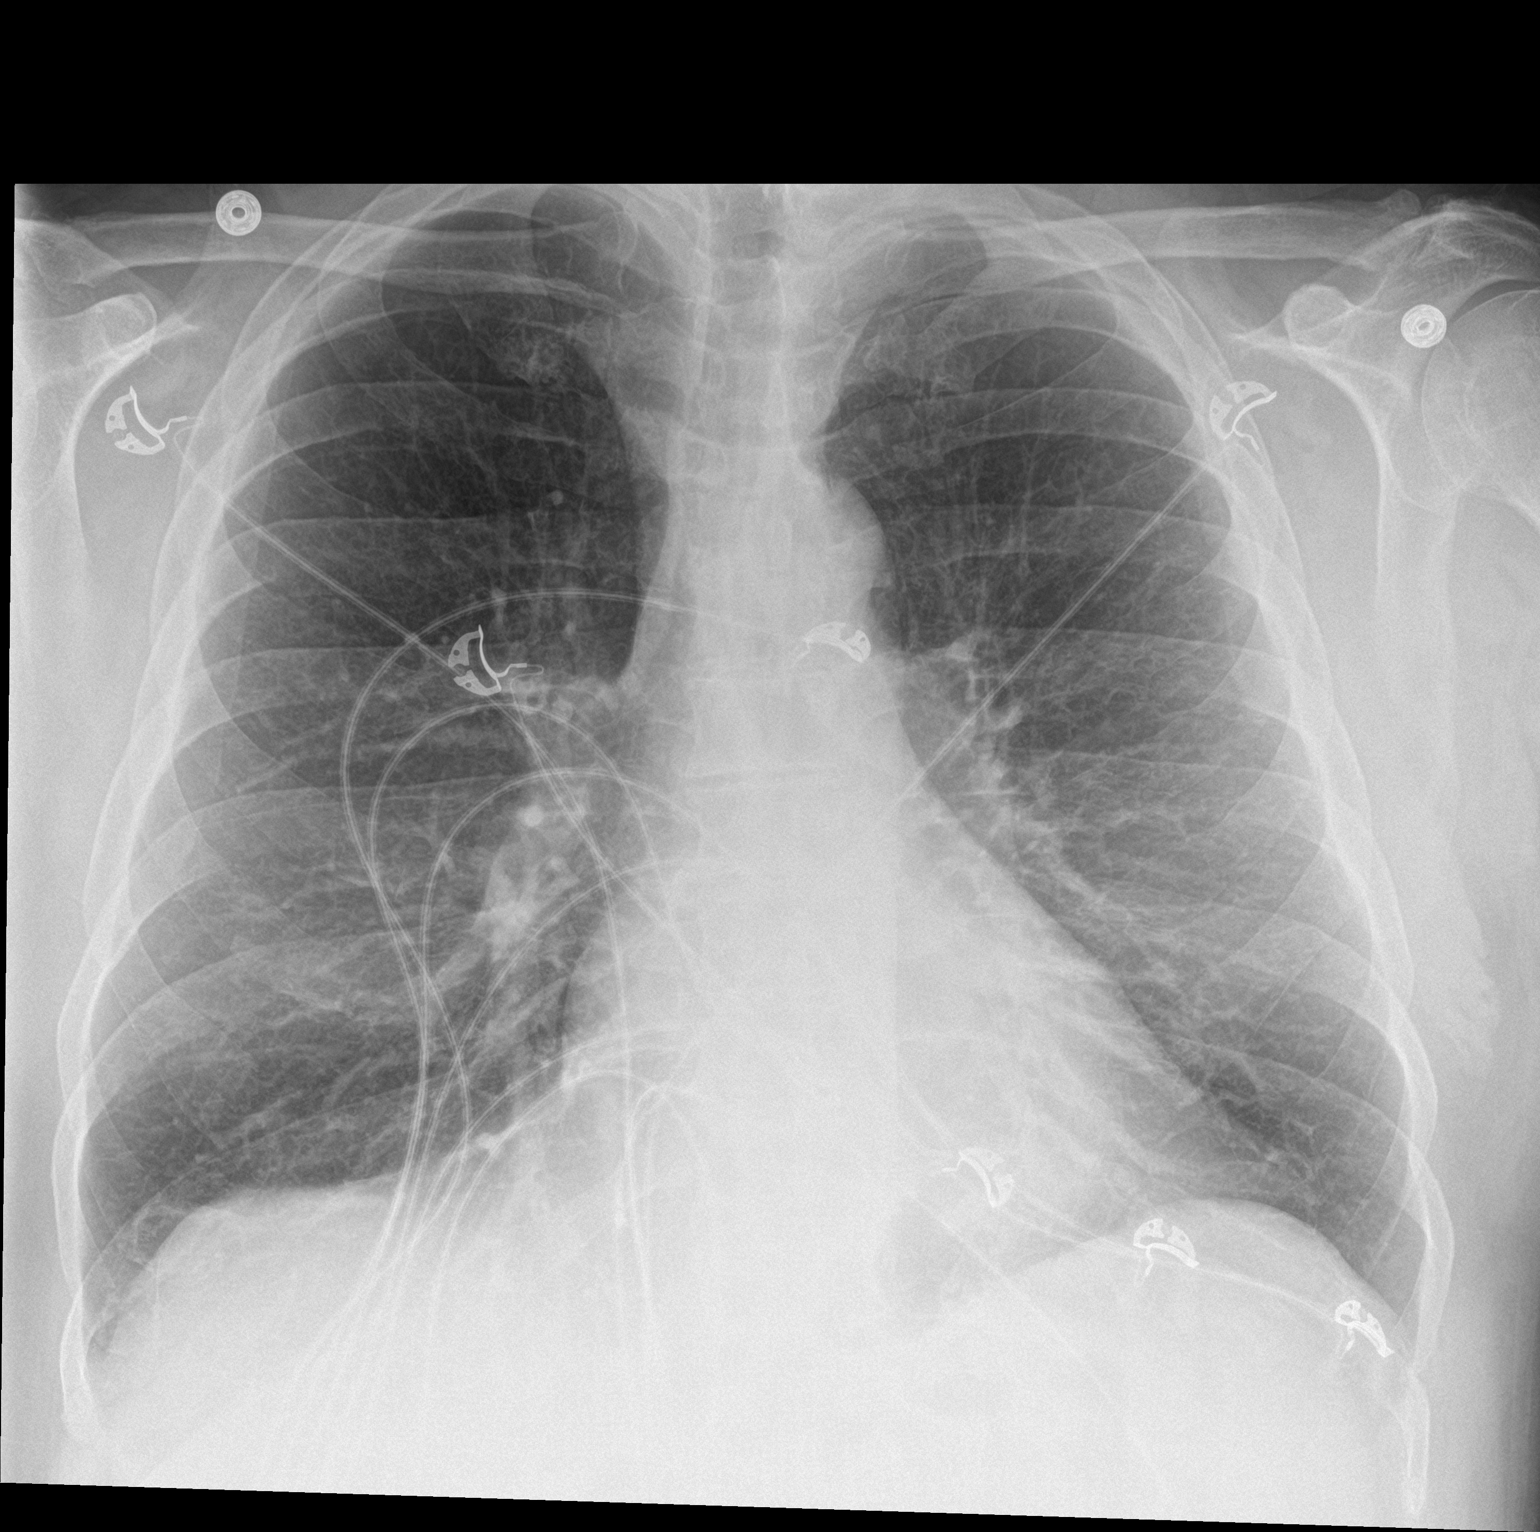

[chest lat]
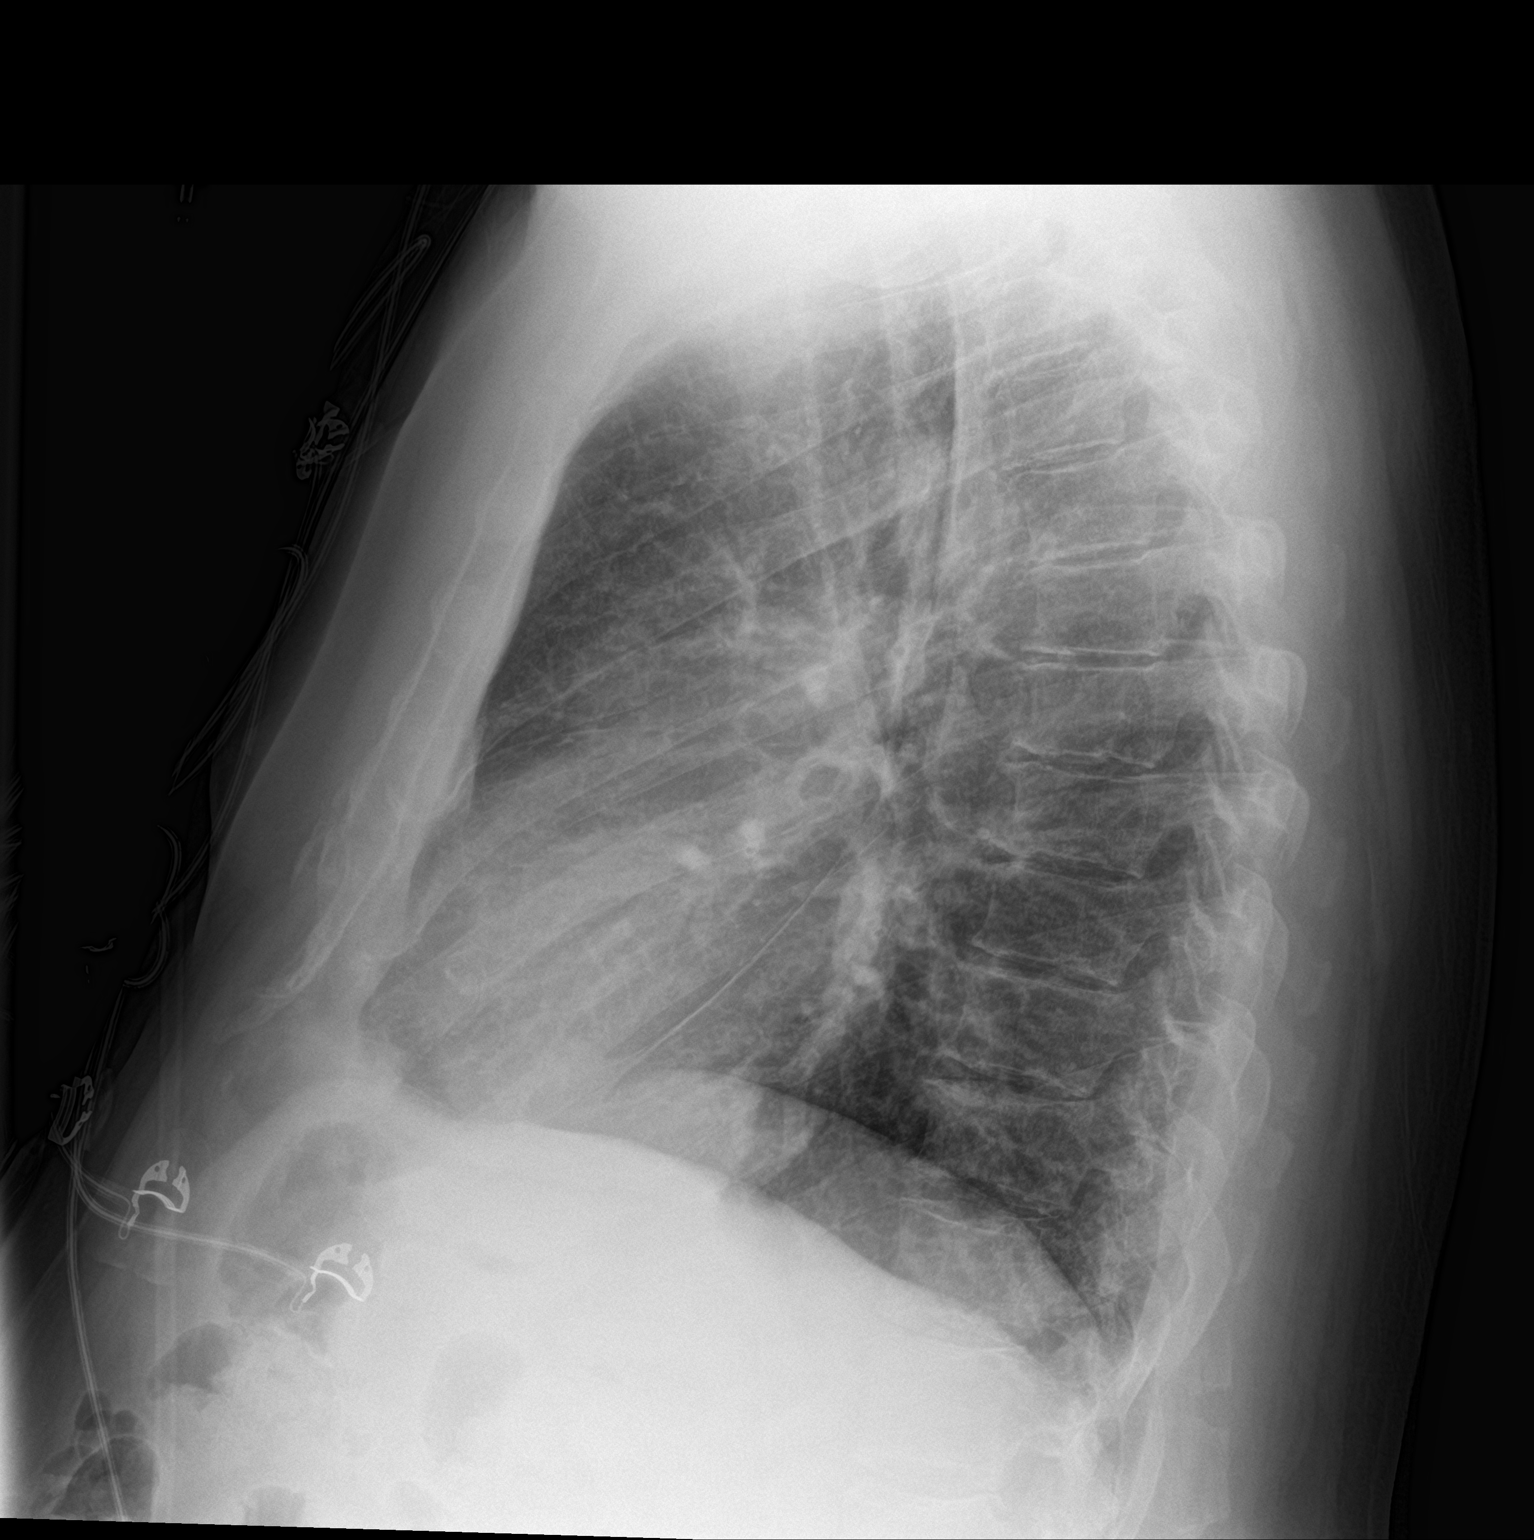

[2 of 2 positions shown; findings below may reference images not displayed]

FINDINGS: Stable mild cardiac enlargement. Vascular pattern normal. No
consolidation or effusion. Stable mild bronchitic change.
IMPRESSION: Mild chronic bronchitic change.  No acute findings.

## 2017-07-11 IMAGING — CR DG CHEST 1V PORT
1 series · 1 of 1 positions shown · non-contrast
Comparison: October 24, 2015

CLINICAL DATA: Shortness of breath.  Hypertension.

EXAM:
PORTABLE CHEST 1 VIEW

[ap]
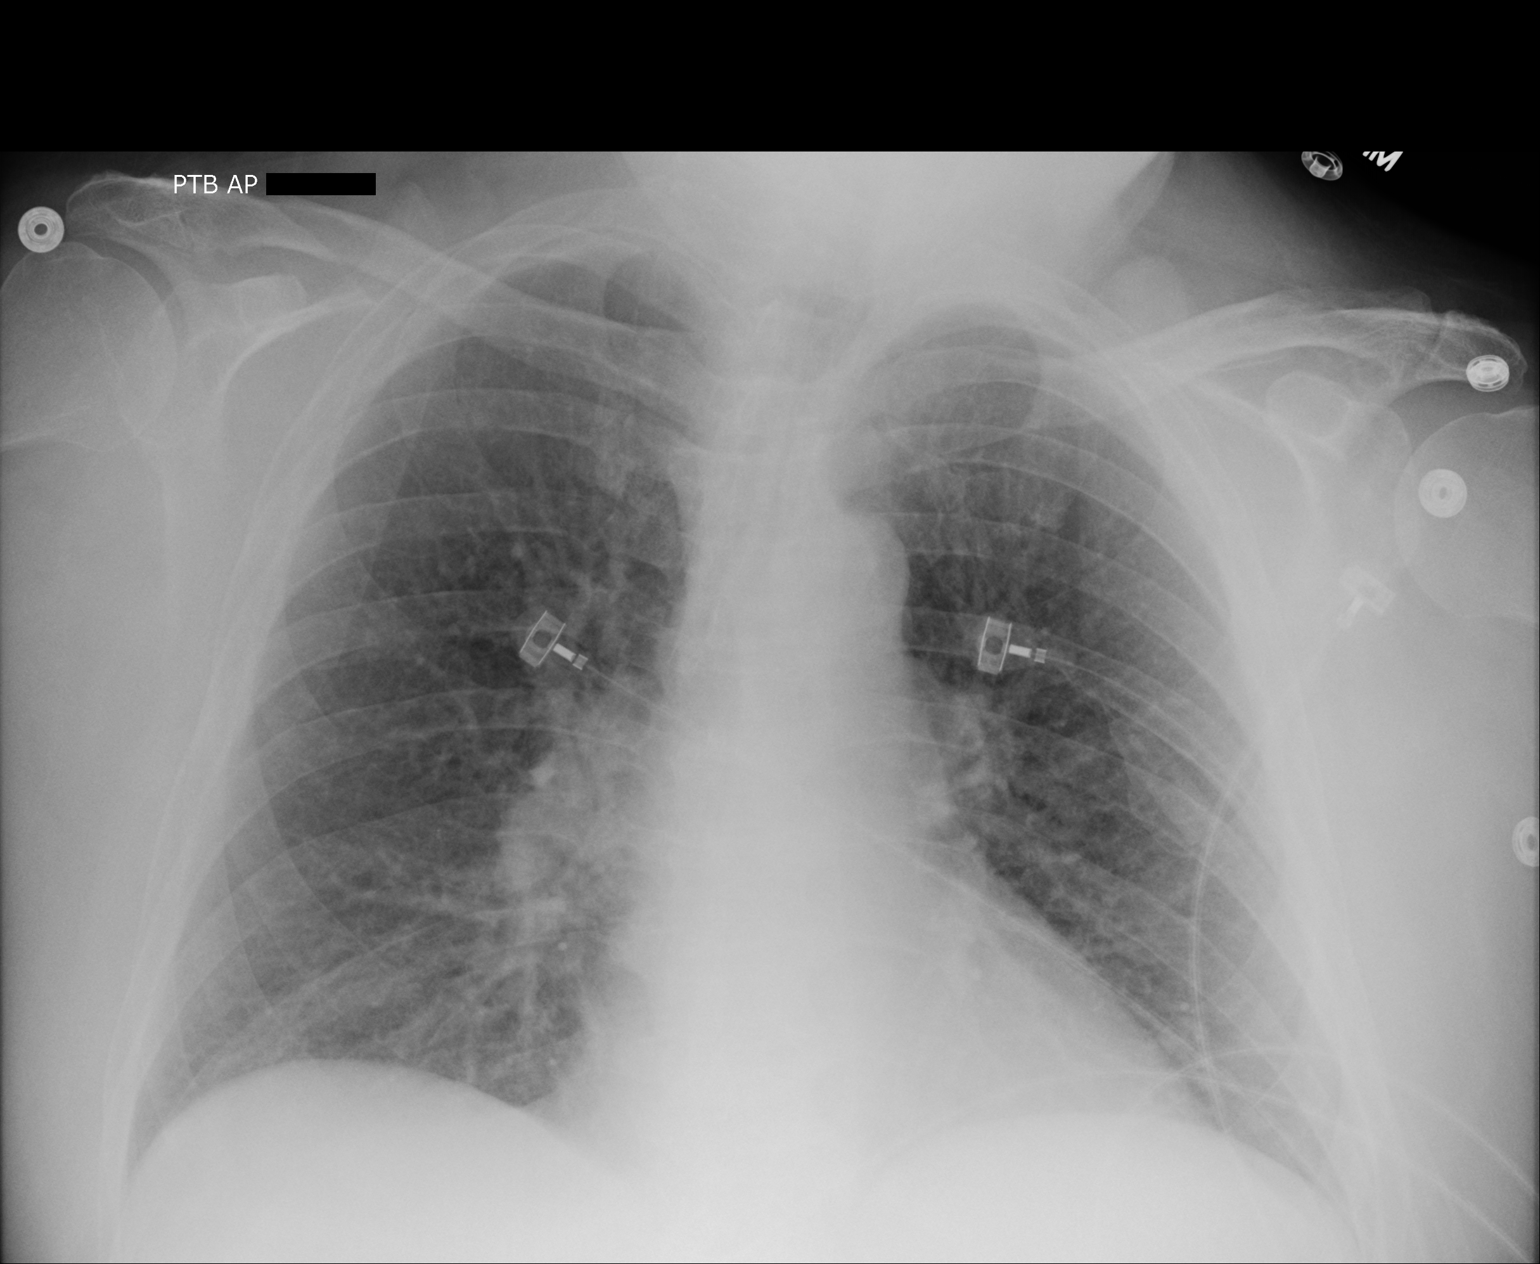

[1 of 1 positions shown; findings below may reference images not displayed]

FINDINGS: There is mild central peribronchial thickening. No edema or
consolidation. Heart size and pulmonary vascularity normal. No
adenopathy. No bone lesions.
IMPRESSION: Changes suggesting a degree of chronic bronchitis remain. No edema
or consolidation. No change in cardiac silhouette.

## 2017-09-22 DIAGNOSIS — E785 Hyperlipidemia, unspecified: Secondary | ICD-10-CM | POA: Diagnosis not present

## 2017-09-22 DIAGNOSIS — E1165 Type 2 diabetes mellitus with hyperglycemia: Secondary | ICD-10-CM | POA: Diagnosis not present

## 2017-09-27 DIAGNOSIS — E1165 Type 2 diabetes mellitus with hyperglycemia: Secondary | ICD-10-CM | POA: Diagnosis not present

## 2017-09-27 DIAGNOSIS — F5101 Primary insomnia: Secondary | ICD-10-CM | POA: Diagnosis not present

## 2017-09-27 DIAGNOSIS — M25551 Pain in right hip: Secondary | ICD-10-CM | POA: Diagnosis not present

## 2017-09-27 DIAGNOSIS — F0390 Unspecified dementia without behavioral disturbance: Secondary | ICD-10-CM | POA: Diagnosis not present

## 2017-09-27 DIAGNOSIS — F1026 Alcohol dependence with alcohol-induced persisting amnestic disorder: Secondary | ICD-10-CM | POA: Diagnosis not present

## 2017-09-27 DIAGNOSIS — M25561 Pain in right knee: Secondary | ICD-10-CM | POA: Diagnosis not present

## 2017-09-27 DIAGNOSIS — J449 Chronic obstructive pulmonary disease, unspecified: Secondary | ICD-10-CM | POA: Diagnosis not present

## 2017-09-27 DIAGNOSIS — E785 Hyperlipidemia, unspecified: Secondary | ICD-10-CM | POA: Diagnosis not present

## 2017-09-27 DIAGNOSIS — I5020 Unspecified systolic (congestive) heart failure: Secondary | ICD-10-CM | POA: Diagnosis not present

## 2017-12-23 DIAGNOSIS — M25551 Pain in right hip: Secondary | ICD-10-CM | POA: Diagnosis not present

## 2017-12-23 DIAGNOSIS — F0390 Unspecified dementia without behavioral disturbance: Secondary | ICD-10-CM | POA: Diagnosis not present

## 2017-12-23 DIAGNOSIS — M25562 Pain in left knee: Secondary | ICD-10-CM | POA: Diagnosis not present

## 2017-12-23 DIAGNOSIS — J449 Chronic obstructive pulmonary disease, unspecified: Secondary | ICD-10-CM | POA: Diagnosis not present

## 2017-12-23 DIAGNOSIS — I5020 Unspecified systolic (congestive) heart failure: Secondary | ICD-10-CM | POA: Diagnosis not present

## 2017-12-23 DIAGNOSIS — M25561 Pain in right knee: Secondary | ICD-10-CM | POA: Diagnosis not present

## 2017-12-23 DIAGNOSIS — F1026 Alcohol dependence with alcohol-induced persisting amnestic disorder: Secondary | ICD-10-CM | POA: Diagnosis not present

## 2017-12-23 DIAGNOSIS — E1165 Type 2 diabetes mellitus with hyperglycemia: Secondary | ICD-10-CM | POA: Diagnosis not present

## 2017-12-23 DIAGNOSIS — Z6838 Body mass index (BMI) 38.0-38.9, adult: Secondary | ICD-10-CM | POA: Diagnosis not present

## 2017-12-28 DIAGNOSIS — Z6838 Body mass index (BMI) 38.0-38.9, adult: Secondary | ICD-10-CM | POA: Diagnosis not present

## 2017-12-28 DIAGNOSIS — J449 Chronic obstructive pulmonary disease, unspecified: Secondary | ICD-10-CM | POA: Diagnosis not present

## 2017-12-28 DIAGNOSIS — F0390 Unspecified dementia without behavioral disturbance: Secondary | ICD-10-CM | POA: Diagnosis not present

## 2017-12-28 DIAGNOSIS — I5020 Unspecified systolic (congestive) heart failure: Secondary | ICD-10-CM | POA: Diagnosis not present

## 2017-12-28 DIAGNOSIS — F5101 Primary insomnia: Secondary | ICD-10-CM | POA: Diagnosis not present

## 2017-12-28 DIAGNOSIS — E782 Mixed hyperlipidemia: Secondary | ICD-10-CM | POA: Diagnosis not present

## 2017-12-28 DIAGNOSIS — F1026 Alcohol dependence with alcohol-induced persisting amnestic disorder: Secondary | ICD-10-CM | POA: Diagnosis not present

## 2017-12-28 DIAGNOSIS — E512 Wernicke's encephalopathy: Secondary | ICD-10-CM | POA: Diagnosis not present

## 2017-12-28 DIAGNOSIS — E1165 Type 2 diabetes mellitus with hyperglycemia: Secondary | ICD-10-CM | POA: Diagnosis not present

## 2018-04-05 DIAGNOSIS — E785 Hyperlipidemia, unspecified: Secondary | ICD-10-CM | POA: Diagnosis not present

## 2018-04-05 DIAGNOSIS — E782 Mixed hyperlipidemia: Secondary | ICD-10-CM | POA: Diagnosis not present

## 2018-04-05 DIAGNOSIS — E1165 Type 2 diabetes mellitus with hyperglycemia: Secondary | ICD-10-CM | POA: Diagnosis not present

## 2018-04-07 DIAGNOSIS — F17200 Nicotine dependence, unspecified, uncomplicated: Secondary | ICD-10-CM | POA: Diagnosis not present

## 2018-04-07 DIAGNOSIS — I5033 Acute on chronic diastolic (congestive) heart failure: Secondary | ICD-10-CM | POA: Diagnosis not present

## 2018-04-07 DIAGNOSIS — E1122 Type 2 diabetes mellitus with diabetic chronic kidney disease: Secondary | ICD-10-CM | POA: Diagnosis not present

## 2018-04-07 DIAGNOSIS — J449 Chronic obstructive pulmonary disease, unspecified: Secondary | ICD-10-CM | POA: Diagnosis not present

## 2018-04-07 DIAGNOSIS — N183 Chronic kidney disease, stage 3 (moderate): Secondary | ICD-10-CM | POA: Diagnosis not present

## 2018-04-07 DIAGNOSIS — F0390 Unspecified dementia without behavioral disturbance: Secondary | ICD-10-CM | POA: Diagnosis not present

## 2018-04-07 DIAGNOSIS — F5101 Primary insomnia: Secondary | ICD-10-CM | POA: Diagnosis not present

## 2018-04-07 DIAGNOSIS — E785 Hyperlipidemia, unspecified: Secondary | ICD-10-CM | POA: Diagnosis not present

## 2018-04-07 DIAGNOSIS — F1026 Alcohol dependence with alcohol-induced persisting amnestic disorder: Secondary | ICD-10-CM | POA: Diagnosis not present

## 2018-05-31 DIAGNOSIS — R195 Other fecal abnormalities: Secondary | ICD-10-CM | POA: Diagnosis not present

## 2018-05-31 DIAGNOSIS — B3749 Other urogenital candidiasis: Secondary | ICD-10-CM | POA: Diagnosis not present

## 2018-05-31 DIAGNOSIS — Z0001 Encounter for general adult medical examination with abnormal findings: Secondary | ICD-10-CM | POA: Diagnosis not present

## 2018-05-31 DIAGNOSIS — Z6839 Body mass index (BMI) 39.0-39.9, adult: Secondary | ICD-10-CM | POA: Diagnosis not present

## 2018-05-31 DIAGNOSIS — Z23 Encounter for immunization: Secondary | ICD-10-CM | POA: Diagnosis not present

## 2018-06-06 ENCOUNTER — Encounter: Payer: Self-pay | Admitting: Gastroenterology

## 2018-08-09 DIAGNOSIS — H5200 Hypermetropia, unspecified eye: Secondary | ICD-10-CM | POA: Diagnosis not present

## 2018-08-14 DIAGNOSIS — I951 Orthostatic hypotension: Secondary | ICD-10-CM | POA: Diagnosis not present

## 2018-08-14 DIAGNOSIS — R42 Dizziness and giddiness: Secondary | ICD-10-CM | POA: Diagnosis not present

## 2018-08-16 ENCOUNTER — Encounter: Payer: Self-pay | Admitting: Gastroenterology

## 2018-08-16 DIAGNOSIS — E785 Hyperlipidemia, unspecified: Secondary | ICD-10-CM | POA: Diagnosis not present

## 2018-08-16 DIAGNOSIS — E1122 Type 2 diabetes mellitus with diabetic chronic kidney disease: Secondary | ICD-10-CM | POA: Diagnosis not present

## 2018-08-16 DIAGNOSIS — E1165 Type 2 diabetes mellitus with hyperglycemia: Secondary | ICD-10-CM | POA: Diagnosis not present

## 2018-08-16 DIAGNOSIS — E782 Mixed hyperlipidemia: Secondary | ICD-10-CM | POA: Diagnosis not present

## 2018-08-16 DIAGNOSIS — I951 Orthostatic hypotension: Secondary | ICD-10-CM | POA: Diagnosis not present

## 2018-08-23 DIAGNOSIS — E1122 Type 2 diabetes mellitus with diabetic chronic kidney disease: Secondary | ICD-10-CM | POA: Diagnosis not present

## 2018-08-23 DIAGNOSIS — J449 Chronic obstructive pulmonary disease, unspecified: Secondary | ICD-10-CM | POA: Diagnosis not present

## 2018-08-23 DIAGNOSIS — F1020 Alcohol dependence, uncomplicated: Secondary | ICD-10-CM | POA: Diagnosis not present

## 2018-08-23 DIAGNOSIS — F17200 Nicotine dependence, unspecified, uncomplicated: Secondary | ICD-10-CM | POA: Diagnosis not present

## 2018-08-23 DIAGNOSIS — E512 Wernicke's encephalopathy: Secondary | ICD-10-CM | POA: Diagnosis not present

## 2018-08-23 DIAGNOSIS — F0390 Unspecified dementia without behavioral disturbance: Secondary | ICD-10-CM | POA: Diagnosis not present

## 2018-08-23 DIAGNOSIS — E782 Mixed hyperlipidemia: Secondary | ICD-10-CM | POA: Diagnosis not present

## 2018-08-23 DIAGNOSIS — Z6841 Body Mass Index (BMI) 40.0 and over, adult: Secondary | ICD-10-CM | POA: Diagnosis not present

## 2018-08-23 DIAGNOSIS — N182 Chronic kidney disease, stage 2 (mild): Secondary | ICD-10-CM | POA: Diagnosis not present

## 2018-08-23 DIAGNOSIS — I5030 Unspecified diastolic (congestive) heart failure: Secondary | ICD-10-CM | POA: Diagnosis not present

## 2018-08-31 ENCOUNTER — Encounter: Payer: Self-pay | Admitting: *Deleted

## 2018-08-31 ENCOUNTER — Encounter: Payer: Self-pay | Admitting: Gastroenterology

## 2018-08-31 ENCOUNTER — Telehealth: Payer: Self-pay | Admitting: *Deleted

## 2018-08-31 ENCOUNTER — Other Ambulatory Visit: Payer: Self-pay | Admitting: *Deleted

## 2018-08-31 ENCOUNTER — Ambulatory Visit: Payer: BLUE CROSS/BLUE SHIELD | Admitting: Gastroenterology

## 2018-08-31 VITALS — BP 135/88 | HR 105 | Temp 97.1°F | Ht 69.0 in | Wt 272.8 lb

## 2018-08-31 DIAGNOSIS — R195 Other fecal abnormalities: Secondary | ICD-10-CM | POA: Diagnosis not present

## 2018-08-31 DIAGNOSIS — K625 Hemorrhage of anus and rectum: Secondary | ICD-10-CM | POA: Insufficient documentation

## 2018-08-31 DIAGNOSIS — R945 Abnormal results of liver function studies: Secondary | ICD-10-CM | POA: Diagnosis not present

## 2018-08-31 DIAGNOSIS — R7989 Other specified abnormal findings of blood chemistry: Secondary | ICD-10-CM

## 2018-08-31 MED ORDER — PEG 3350-KCL-NA BICARB-NACL 420 G PO SOLR: 4000.0000 mL | Freq: Once | ORAL | 0 refills | Status: AC

## 2018-08-31 NOTE — Progress Notes (Addendum)
Primary Care Physician:  Celene Squibb, MD  Primary Gastroenterologist:  Barney Drain, MD  REVIEWED-NO ADDITIONAL RECOMMENDATIONS.  Chief Complaint  Patient presents with  . heme+ stool    sees some blood at times when wipes    HPI:  Jeff Miles is a 67 y.o. male here at the request of Dr. Nevada Crane for further evaluation of IFOBT positive stool.  Patient states he performed mail in IFOBT for his insurance company.  Received report that it was positive.  He presents with his wife today who helps with history.  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  Stopped drinking in 2017 when hospitalized with complications due to alcohol use at that time.  From a GI standpoint, he has had some intermittent bright red blood per rectum.  No significant constipation or diarrhea.  No melena.  Denies abdominal pain.  No upper GI symptoms.  No prior colonoscopy.  Wife reports that his liver numbers have been elevated.      Current Outpatient Medications  Medication Sig Dispense Refill  . albuterol (PROVENTIL) (2.5 MG/3ML) 0.083% nebulizer solution Take 2.5 mg by nebulization 3 (three) times daily.    . Artificial Tear Solution (SOOTHE XP OP) Apply to eye 2 (two) times daily.    . fluticasone furoate-vilanterol (BREO ELLIPTA) 200-25 MCG/INH AEPB Inhale 1 puff into the lungs daily.    . furosemide (LASIX) 40 MG tablet Take 1 tablet (40 mg total) by mouth daily. 30 tablet   . glipiZIDE (GLUCOTROL) 5 MG tablet Take by mouth 2 (two) times daily before a meal.    . loratadine (CLARITIN) 10 MG tablet Take 10 mg by mouth daily.    Marland Kitchen LORazepam (ATIVAN) 1 MG tablet Take 1 mg by mouth daily.     . meclizine (DRAMAMINE II) 25 MG tablet Take 25 mg by mouth as needed for dizziness.    . metFORMIN (GLUCOPHAGE) 500 MG tablet Take 500 mg by mouth 2 (two) times daily with a meal.    . mirtazapine (REMERON) 15 MG tablet Take 15 mg by mouth at bedtime.    .  Multiple Vitamin (MULTIVITAMIN WITH MINERALS) TABS tablet Take 1 tablet by mouth daily.    . naltrexone (DEPADE) 50 MG tablet Take 50 mg by mouth daily.    . Omega-3 Fatty Acids (FISH OIL) 1000 MG CAPS Take by mouth daily.    Marland Kitchen OMEPRAZOLE PO Take 20 mg by mouth daily.     . pravastatin (PRAVACHOL) 20 MG tablet Take 20 mg by mouth daily.    . risperiDONE (RISPERDAL) 0.5 MG tablet Take 0.5 mg by mouth at bedtime.    . Thiamine HCl (VITAMIN B-1) 250 MG tablet Take 250 mg by mouth daily.    . vitamin B-12 1000 MCG tablet Take 1 tablet (1,000 mcg total) by mouth daily.    Marland Kitchen acamprosate (CAMPRAL) 333 MG tablet 333 mg.    . albuterol (PROVENTIL HFA;VENTOLIN HFA) 108 (90 Base) MCG/ACT inhaler Inhale into the lungs every 6 (six) hours as needed for wheezing or shortness of breath.     No current facility-administered medications for this visit.     Allergies as of 08/31/2018  . (No Known Allergies)    Past Medical History:  Diagnosis Date  . CHF (congestive heart failure) ([place])   . COPD (chronic obstructive pulmonary disease) ([place])   . Dementia ([place])    alcohol related  . Diabetes ([place])   .  Hypertension   . Wernicke's encephalopathy     Past Surgical History:  Procedure Laterality Date  . BACK SURGERY     two  . INNER EAR SURGERY     [age] surgery  . NOSE SURGERY      Family History  Problem Relation Age of Onset  . Colon cancer Neg Hx   . Liver disease Neg Hx     Social History   Socioeconomic History  . Marital status: Married    Spouse name: Not on file  . Number of children: Not on file  . Years of education: Not on file  . Highest education level: Not on file  Occupational History  . Not on file  Social Needs  . Financial resource strain: Not on file  . Food insecurity:    Worry: Not on file    Inability: Not on file  . Transportation needs:    Medical: Not on file    Non-medical: Not on file  Tobacco Use  . Smoking status: Former Research scientist (life sciences)  . Smokeless tobacco:  Never Used  Substance and Sexual Activity  . Alcohol use: Not Currently    Comment: "6 beers a day"; none since 2017  . Drug use: No  . Sexual activity: Not on file  Lifestyle  . Physical activity:    Days per week: Not on file    Minutes per session: Not on file  . Stress: Not on file  Relationships  . Social connections:    Talks on phone: Not on file    Gets together: Not on file    Attends religious service: Not on file    Active member of club or organization: Not on file    Attends meetings of clubs or organizations: Not on file    Relationship status: Not on file  . Intimate partner violence:    Fear of current or ex partner: Not on file    Emotionally abused: Not on file    Physically abused: Not on file    Forced sexual activity: Not on file  Other Topics Concern  . Not on file  Social History Narrative  . Not on file      ROS:  General: Negative for anorexia, weight loss, fever, chills, fatigue, weakness. Eyes: Negative for vision changes.  ENT: Negative for hoarseness, difficulty swallowing , nasal congestion.  Positive hearing loss CV: Negative for chest pain, angina, palpitations, dyspnea on exertion, peripheral edema.  Respiratory: Negative for dyspnea at rest, dyspnea on exertion, cough, sputum, wheezing.  GI: See history of present illness. GU:  Negative for dysuria, hematuria, urinary incontinence, urinary frequency, nocturnal urination.  MS: Negative for joint pain, low back pain.  Derm: Negative for rash or itching.  Neuro: Negative for weakness, abnormal sensation, seizure, frequent headaches, positive memory loss, confusion.  Some dizziness/vertigo intermittently. Psych: Negative for anxiety, depression, suicidal ideation, hallucinations.  Endo: Negative for unusual weight change.  Heme: Negative for bruising or bleeding. Allergy: Negative for rash or hives.    Physical Examination:  BP 135/88   Pulse (!) 105   Temp (!) 97.1 F (36.2 C)  (Oral)   Ht 5\' 9"  (1.753 m)   Wt 272 lb 12.8 oz (123.7 kg)   BMI 40.29 kg/m    General: Chronically ill-appearing, no acute distress Head: Normocephalic, atraumatic.   Eyes: Conjunctiva pink, no icterus. Mouth: Oropharyngeal mucosa moist and pink , no lesions erythema or exudate. Neck: Supple without thyromegaly, masses, or lymphadenopathy.  Lungs: Clear  to auscultation bilaterally.  Heart: Regular rate and rhythm, no murmurs rubs or gallops.  Abdomen: Bowel sounds are normal, nontender, nondistended, no hepatosplenomegaly or masses, no abdominal bruits or    hernia , no rebound or guarding.  Caput medusae Rectal: Deferred Extremities: No lower extremity edema. No clubbing or deformities.  Neuro: Alert and oriented x 4 , grossly normal neurologically.  Skin: Warm and dry, no rash or jaundice.   Psych: Alert and cooperative, normal mood and affect.   Imaging Studies: No results found.

## 2018-08-31 NOTE — Telephone Encounter (Signed)
Pre-op scheduled for 11/21/18 at 10:00am. Letter mailed. Called spouse Carol-LMTCB on [phone]

## 2018-08-31 NOTE — Assessment & Plan Note (Signed)
67 year old gentleman with recent Hemoccult positive stool, intermittent bright red blood per rectum.  No prior colonoscopy.  Plan on colonoscopy with deep sedation given polypharmacy and prior alcohol abuse.  I have discussed the risks, alternatives, benefits with regards to but not limited to the risk of reaction to medication, bleeding, infection, perforation and the patient is agreeable to proceed. Written consent to be obtained.

## 2018-08-31 NOTE — Progress Notes (Signed)
Labs from PCP dated 08/16/2018 White blood cell count 8200, hemoglobin 15.2, platelets 209,000, glucose 162, BUN 17, creatinine 1.21, sodium 141, potassium 4.7, albumin 4.5, total bilirubin 0.3, alkaline phosphatase 91, AST 31, ALT 54     PLEASE LET PATIENT KNOW WE REVIEWED LABS.  WOULD ADVISE ADDITIONAL LABS: HEP C ANTIBODY, HEP B SURFACE ANTIGEN, HEP A TOTAL ANTIBODY, HEP B SURFACE ANTIBODY, IRON/TIBC/FERRITIN IF AGREEABLE.

## 2018-08-31 NOTE — Patient Instructions (Signed)
Please have your ultrasound as planned.   Colonoscopy as scheduled. See separate instructions.

## 2018-08-31 NOTE — Assessment & Plan Note (Addendum)
Reported abnormal LFTs.  Prior history of alcohol abuse.  Caput medusae on exam.  Obtain baseline abdominal ultrasound. Obtain copy of labs from Dr. Nevada Crane.

## 2018-09-04 NOTE — Progress Notes (Signed)
CC'D TO PCP °

## 2018-09-04 NOTE — Progress Notes (Signed)
LMOM to call.

## 2018-09-05 ENCOUNTER — Ambulatory Visit (HOSPITAL_COMMUNITY)
Admission: RE | Admit: 2018-09-05 | Discharge: 2018-09-05 | Disposition: A | Discharge status: Home / Self Care | Payer: Medicare HMO | Source: Ambulatory Visit | Attending: Gastroenterology | Admitting: Gastroenterology

## 2018-09-05 ENCOUNTER — Other Ambulatory Visit: Payer: Self-pay

## 2018-09-05 DIAGNOSIS — R143 Flatulence: Secondary | ICD-10-CM | POA: Insufficient documentation

## 2018-09-05 DIAGNOSIS — R945 Abnormal results of liver function studies: Secondary | ICD-10-CM

## 2018-09-05 DIAGNOSIS — K625 Hemorrhage of anus and rectum: Secondary | ICD-10-CM | POA: Insufficient documentation

## 2018-09-05 DIAGNOSIS — R7989 Other specified abnormal findings of blood chemistry: Secondary | ICD-10-CM

## 2018-09-05 DIAGNOSIS — R195 Other fecal abnormalities: Secondary | ICD-10-CM | POA: Diagnosis not present

## 2018-09-05 NOTE — Progress Notes (Signed)
Mailed lab orders with letter.

## 2018-09-05 NOTE — Progress Notes (Signed)
LMOM to call. Mailing a letter also to call.  

## 2018-09-11 ENCOUNTER — Telehealth: Payer: Self-pay

## 2018-09-11 NOTE — Telephone Encounter (Signed)
Pt's wife, carol, called and was informed that Dr. Oneida Alar had reviewed his labs and was requesting additional labs. She is aware to expect the orders in the mail and then she will have pt do those labs.

## 2018-09-12 ENCOUNTER — Other Ambulatory Visit (HOSPITAL_COMMUNITY)
Admission: RE | Admit: 2018-09-12 | Discharge: 2018-09-12 | Disposition: A | Discharge status: Home / Self Care | Payer: Medicare HMO | Source: Ambulatory Visit | Attending: Gastroenterology | Admitting: Gastroenterology

## 2018-09-12 DIAGNOSIS — R945 Abnormal results of liver function studies: Secondary | ICD-10-CM | POA: Diagnosis not present

## 2018-09-12 LAB — FERRITIN: Ferritin: 96 ng/mL (ref 24–336)

## 2018-09-12 LAB — IRON AND TIBC
IRON: 69 ug/dL (ref 45–182)
Saturation Ratios: 18 % (ref 17.9–39.5)
TIBC: 394 ug/dL (ref 250–450)
UIBC: 325 ug/dL

## 2018-09-13 LAB — HEPATITIS A ANTIBODY, TOTAL: Hep A Total Ab: NEGATIVE

## 2018-09-13 LAB — HEPATITIS B SURFACE ANTIBODY, QUANTITATIVE: Hep B S AB Quant (Post): <3.1 m[IU]/mL — ABNORMAL LOW (ref >9.9)

## 2018-09-13 LAB — HEPATITIS C ANTIBODY: HCV Ab: <0.1 s/co ratio (ref 0.0–0.9)

## 2018-09-13 LAB — HEPATITIS B SURFACE ANTIGEN: Hepatitis B Surface Ag: NEGATIVE

## 2018-09-18 NOTE — Progress Notes (Signed)
ON RECALL FOR ULTRASOUND  °

## 2018-09-18 NOTE — Progress Notes (Signed)
Pt's wife is aware and I gave her the Rx for the Hep A and B vaccines.  Forwarding to [place] to nic the [place] in one year.

## 2018-09-21 ENCOUNTER — Telehealth: Payer: Self-pay | Admitting: Gastroenterology

## 2018-09-21 NOTE — Telephone Encounter (Signed)
PLEASE CALL PT. HE IS IMMUNE TO HEP B. HE DOES NOT  HAVE HEP C. HE IS NOT IMMUNE TO HEP A.  HE SHOULD SEE HIS PCP FOR HIS HEP A VACCINE.

## 2018-09-22 NOTE — Telephone Encounter (Signed)
Left message for a return call

## 2018-09-25 NOTE — Telephone Encounter (Signed)
PT's wife Arbie Cookey, is aware and I am mailing her the order for the Hep A to do at a drug store.

## 2018-09-26 NOTE — Telephone Encounter (Signed)
REVIEWED-NO ADDITIONAL RECOMMENDATIONS. 

## 2018-10-10 ENCOUNTER — Telehealth: Payer: Self-pay

## 2018-10-10 NOTE — Telephone Encounter (Signed)
REVIEWED-NO ADDITIONAL RECOMMENDATIONS. 

## 2018-10-10 NOTE — Telephone Encounter (Signed)
Pt brought by the paper from [place] showing he had Hep A vaccine on 09/29/2018.  Placed it on desk for Dr. Oneida Alar.

## 2018-11-14 ENCOUNTER — Other Ambulatory Visit (HOSPITAL_COMMUNITY): Payer: Medicare HMO

## 2018-11-17 NOTE — Patient Instructions (Signed)
TATSUO MUSIAL  11/17/2018     @PREFPERIOPPHARMACY @   Your procedure is scheduled on  11/28/2018 .  Report to [facility] at  615  A.M.  Call this number if you have problems the morning of surgery:  [phone]   Remember:  Follow the diet and prep instructions given to you by Dr Nona Dell office.                        Take these medicines the morning of surgery with A SIP OF WATER  Claritin, ativan (if needed), depade. Use your nebulizer before you come.    Do not wear jewelry, make-up or nail polish.  Do not wear lotions, powders, or perfumes, or deodorant.  Do not shave 48 hours prior to surgery.  Men may shave face and neck.  Do not bring valuables to the hospital.  [facility] is not responsible for any belongings or valuables.  Contacts, dentures or bridgework may not be worn into surgery.  Leave your suitcase in the car.  After surgery it may be brought to your room.  For patients admitted to the hospital, discharge time will be determined by your treatment team.  Patients discharged the day of surgery will not be allowed to drive home.   Name and phone number of your driver:   None Special instructions:  None  Please read over the following fact sheets that you were given. Anesthesia Post-op Instructions and Care and Recovery After Surgery       Colonoscopy, Adult, Care After This sheet gives you information about how to care for yourself after your procedure. Your health care provider may also give you more specific instructions. If you have problems or questions, contact your health care provider. What can I expect after the procedure? After the procedure, it is common to have:  A small amount of blood in your stool for 24 hours after the procedure.  Some gas.  Mild abdominal cramping or bloating. Follow these instructions at home: General instructions  For the first 24 hours after the procedure: ? Do not drive or use machinery. ? Do not sign  important documents. ? Do not drink alcohol. ? Do your regular daily activities at a slower pace than normal. ? Eat soft, easy-to-digest foods.  Take over-the-counter or prescription medicines only as told by your health care provider. Relieving cramping and bloating   Try walking around when you have cramps or feel bloated.  Apply heat to your abdomen as told by your health care provider. Use a heat source that your health care provider recommends, such as a moist heat pack or a heating pad. ? Place a towel between your skin and the heat source. ? Leave the heat on for 20-30 minutes. ? Remove the heat if your skin turns bright red. This is especially important if you are unable to feel pain, heat, or cold. You may have a greater risk of getting burned. Eating and drinking   Drink enough fluid to keep your urine pale yellow.  Resume your normal diet as instructed by your health care provider. Avoid heavy or fried foods that are hard to digest.  Avoid drinking alcohol for as long as instructed by your health care provider. Contact a health care provider if:  You have blood in your stool 2-3 days after the procedure. Get help right away if:  You have more than a small spotting of blood  in your stool.  You pass large blood clots in your stool.  Your abdomen is swollen.  You have nausea or vomiting.  You have a fever.  You have increasing abdominal pain that is not relieved with medicine. Summary  After the procedure, it is common to have a small amount of blood in your stool. You may also have mild abdominal cramping and bloating.  For the first 24 hours after the procedure, do not drive or use machinery, sign important documents, or drink alcohol.  Contact your health care provider if you have a lot of blood in your stool, nausea or vomiting, a fever, or increased abdominal pain. This information is not intended to replace advice given to you by your health care provider.  Make sure you discuss any questions you have with your health care provider. Document Released: 04/13/2004 Document Revised: 06/22/2017 Document Reviewed: 11/11/2015 Elsevier Interactive Patient Education  2019 [place], Care After These instructions provide you with information about caring for yourself after your procedure. Your health care provider may also give you more specific instructions. Your treatment has been planned according to current medical practices, but problems sometimes occur. Call your health care provider if you have any problems or questions after your procedure. What can I expect after the procedure? After your procedure, you may:  Feel sleepy for several hours.  Feel clumsy and have poor balance for several hours.  Feel forgetful about what happened after the procedure.  Have poor judgment for several hours.  Feel nauseous or vomit.  Have a sore throat if you had a breathing tube during the procedure. Follow these instructions at home: For at least 24 hours after the procedure:      Have a responsible adult stay with you. It is important to have someone help care for you until you are awake and alert.  Rest as needed.  Do not: ? Participate in activities in which you could fall or become injured. ? Drive. ? Use heavy machinery. ? Drink alcohol. ? Take sleeping pills or medicines that cause drowsiness. ? Make important decisions or sign legal documents. ? Take care of children on your own. Eating and drinking  Follow the diet that is recommended by your health care provider.  If you vomit, drink water, juice, or soup when you can drink without vomiting.  Make sure you have little or no nausea before eating solid foods. General instructions  Take over-the-counter and prescription medicines only as told by your health care provider.  If you have sleep apnea, surgery and certain medicines can increase your risk for  breathing problems. Follow instructions from your health care provider about wearing your sleep device: ? Anytime you are sleeping, including during daytime naps. ? While taking prescription pain medicines, sleeping medicines, or medicines that make you drowsy.  If you smoke, do not smoke without supervision.  Keep all follow-up visits as told by your health care provider. This is important. Contact a health care provider if:  You keep feeling nauseous or you keep vomiting.  You feel light-headed.  You develop a rash.  You have a fever. Get help right away if:  You have trouble breathing. Summary  For several hours after your procedure, you may feel sleepy and have poor judgment.  Have a responsible adult stay with you for at least 24 hours or until you are awake and alert. This information is not intended to replace advice given to you by your health  care provider. Make sure you discuss any questions you have with your health care provider. Document Released: 12/21/2015 Document Revised: 04/15/2017 Document Reviewed: 12/21/2015 Elsevier Interactive Patient Education  2019 [employer].

## 2018-11-21 ENCOUNTER — Encounter (HOSPITAL_COMMUNITY)
Admission: RE | Admit: 2018-11-21 | Discharge: 2018-11-21 | Disposition: A | Discharge status: Home / Self Care | Payer: Medicare HMO | Source: Ambulatory Visit | Attending: Gastroenterology | Admitting: Gastroenterology

## 2018-11-21 ENCOUNTER — Other Ambulatory Visit: Payer: Self-pay

## 2018-11-21 ENCOUNTER — Encounter (HOSPITAL_COMMUNITY): Payer: Self-pay

## 2018-11-21 DIAGNOSIS — Z01818 Encounter for other preprocedural examination: Secondary | ICD-10-CM | POA: Insufficient documentation

## 2018-11-21 DIAGNOSIS — R000 Tachycardia, unspecified: Secondary | ICD-10-CM | POA: Insufficient documentation

## 2018-11-21 HISTORY — DX: Personal history of urinary calculi: Z87.442

## 2018-11-21 HISTORY — DX: Unsteadiness on feet: R26.81

## 2018-11-21 LAB — BASIC METABOLIC PANEL
ANION GAP: 12 (ref 5–15)
BUN: 15 mg/dL (ref 8–23)
CO2: 28 mmol/L (ref 22–32)
Calcium: 9.7 mg/dL (ref 8.9–10.3)
Chloride: 100 mmol/L (ref 98–111)
Creatinine, Ser: 1.19 mg/dL (ref 0.61–1.24)
GFR calc Af Amer: >60 mL/min (ref >60)
GFR calc non Af Amer: >60 mL/min (ref >60)
Glucose, Bld: 140 mg/dL — ABNORMAL HIGH (ref 70–99)
Potassium: 3.9 mmol/L (ref 3.5–5.1)
SODIUM: 140 mmol/L (ref 135–145)

## 2018-11-21 NOTE — Pre-Procedure Instructions (Signed)
Monitor shows HR of 116-120. Denies c/o SOB or CP. Denies feeling light headed or dizzy. "I am nervous, a little". EKG done and shows S tach with rate of 116.

## 2018-11-21 NOTE — Progress Notes (Signed)
   11/21/18 1007  OBSTRUCTIVE SLEEP APNEA  Have you ever been diagnosed with sleep apnea through a sleep study? No  Do you snore loudly (loud enough to be heard through closed doors)?  0  Do you often feel tired, fatigued, or sleepy during the daytime (such as falling asleep during driving or talking to someone)? 0  Has anyone observed you stop breathing during your sleep? 0  Do you have, or are you being treated for high blood pressure? 1  BMI more than 35 kg/m2? 1  Age > 50 (1-yes) 1  Neck circumference greater than:Male 16 inches or larger, Male 17inches or larger? 0  Male Gender (Yes=1) 1  Obstructive Sleep Apnea Score 4  Score 5 or greater  Results sent to PCP

## 2018-11-22 NOTE — Progress Notes (Signed)
CC'D TO PCP °

## 2018-11-28 ENCOUNTER — Ambulatory Visit (HOSPITAL_COMMUNITY): Payer: Medicare HMO | Admitting: Anesthesiology

## 2018-11-28 ENCOUNTER — Encounter (HOSPITAL_COMMUNITY)
Admission: RE | Disposition: A | Discharge status: Home / Self Care | Payer: Self-pay | Source: Home / Self Care | Attending: Gastroenterology

## 2018-11-28 ENCOUNTER — Ambulatory Visit (HOSPITAL_COMMUNITY)
Admission: RE | Admit: 2018-11-28 | Discharge: 2018-11-28 | Disposition: A | Discharge status: Home / Self Care | Payer: Medicare HMO | Attending: Gastroenterology | Admitting: Gastroenterology

## 2018-11-28 ENCOUNTER — Encounter (HOSPITAL_COMMUNITY): Payer: Self-pay | Admitting: *Deleted

## 2018-11-28 DIAGNOSIS — K5730 Diverticulosis of large intestine without perforation or abscess without bleeding: Secondary | ICD-10-CM | POA: Insufficient documentation

## 2018-11-28 DIAGNOSIS — D127 Benign neoplasm of rectosigmoid junction: Secondary | ICD-10-CM | POA: Diagnosis not present

## 2018-11-28 DIAGNOSIS — D120 Benign neoplasm of cecum: Secondary | ICD-10-CM | POA: Diagnosis not present

## 2018-11-28 DIAGNOSIS — Z7951 Long term (current) use of inhaled steroids: Secondary | ICD-10-CM | POA: Insufficient documentation

## 2018-11-28 DIAGNOSIS — D128 Benign neoplasm of rectum: Secondary | ICD-10-CM | POA: Diagnosis not present

## 2018-11-28 DIAGNOSIS — Z79899 Other long term (current) drug therapy: Secondary | ICD-10-CM | POA: Insufficient documentation

## 2018-11-28 DIAGNOSIS — J449 Chronic obstructive pulmonary disease, unspecified: Secondary | ICD-10-CM | POA: Insufficient documentation

## 2018-11-28 DIAGNOSIS — Q438 Other specified congenital malformations of intestine: Secondary | ICD-10-CM | POA: Diagnosis not present

## 2018-11-28 DIAGNOSIS — D123 Benign neoplasm of transverse colon: Secondary | ICD-10-CM | POA: Insufficient documentation

## 2018-11-28 DIAGNOSIS — Z7984 Long term (current) use of oral hypoglycemic drugs: Secondary | ICD-10-CM | POA: Diagnosis not present

## 2018-11-28 DIAGNOSIS — D124 Benign neoplasm of descending colon: Secondary | ICD-10-CM | POA: Diagnosis not present

## 2018-11-28 DIAGNOSIS — E119 Type 2 diabetes mellitus without complications: Secondary | ICD-10-CM | POA: Diagnosis not present

## 2018-11-28 DIAGNOSIS — R195 Other fecal abnormalities: Secondary | ICD-10-CM

## 2018-11-28 DIAGNOSIS — K625 Hemorrhage of anus and rectum: Secondary | ICD-10-CM | POA: Diagnosis not present

## 2018-11-28 DIAGNOSIS — I509 Heart failure, unspecified: Secondary | ICD-10-CM | POA: Diagnosis not present

## 2018-11-28 DIAGNOSIS — F0390 Unspecified dementia without behavioral disturbance: Secondary | ICD-10-CM | POA: Diagnosis not present

## 2018-11-28 DIAGNOSIS — D125 Benign neoplasm of sigmoid colon: Secondary | ICD-10-CM | POA: Insufficient documentation

## 2018-11-28 DIAGNOSIS — K644 Residual hemorrhoidal skin tags: Secondary | ICD-10-CM | POA: Diagnosis not present

## 2018-11-28 DIAGNOSIS — K648 Other hemorrhoids: Secondary | ICD-10-CM

## 2018-11-28 DIAGNOSIS — Z87891 Personal history of nicotine dependence: Secondary | ICD-10-CM | POA: Diagnosis not present

## 2018-11-28 DIAGNOSIS — D122 Benign neoplasm of ascending colon: Secondary | ICD-10-CM | POA: Diagnosis not present

## 2018-11-28 DIAGNOSIS — I110 Hypertensive heart disease with heart failure: Secondary | ICD-10-CM | POA: Insufficient documentation

## 2018-11-28 DIAGNOSIS — K621 Rectal polyp: Secondary | ICD-10-CM | POA: Diagnosis not present

## 2018-11-28 HISTORY — PX: COLONOSCOPY WITH PROPOFOL: SHX5780

## 2018-11-28 HISTORY — PX: POLYPECTOMY: SHX5525

## 2018-11-28 LAB — GLUCOSE, CAPILLARY
Glucose-Capillary: 138 mg/dL — ABNORMAL HIGH (ref 70–99)
Glucose-Capillary: 155 mg/dL — ABNORMAL HIGH (ref 70–99)

## 2018-11-28 SURGERY — COLONOSCOPY WITH PROPOFOL
Anesthesia: General

## 2018-11-28 MED ORDER — SODIUM CHLORIDE (PF) 0.9 % IJ SOLN
PREFILLED_SYRINGE | INTRAMUSCULAR | Status: DC | PRN
  Administered 2018-11-28: 4 mL

## 2018-11-28 MED ORDER — PROPOFOL 500 MG/50ML IV EMUL
INTRAVENOUS | Status: DC | PRN
  Administered 2018-11-28: 100 ug/kg/min via INTRAVENOUS
  Administered 2018-11-28: 08:00:00 via INTRAVENOUS

## 2018-11-28 MED ORDER — CHLORHEXIDINE GLUCONATE CLOTH 2 % EX PADS: 6.0000 | MEDICATED_PAD | Freq: Once | CUTANEOUS | Status: DC

## 2018-11-28 MED ORDER — MIDAZOLAM HCL 2 MG/2ML IJ SOLN: 0.5000 mg | Freq: Once | INTRAMUSCULAR | Status: DC | PRN

## 2018-11-28 MED ORDER — HYDROCODONE-ACETAMINOPHEN 7.5-325 MG PO TABS: 1.0000 | ORAL_TABLET | Freq: Once | ORAL | Status: DC | PRN

## 2018-11-28 MED ORDER — LACTATED RINGERS IV SOLN
INTRAVENOUS | Status: DC
  Administered 2018-11-28: 1000 mL via INTRAVENOUS

## 2018-11-28 MED ORDER — SPOT INK MARKER SYRINGE KIT
PACK | SUBMUCOSAL | Status: DC | PRN
  Administered 2018-11-28: 2 mL via SUBMUCOSAL

## 2018-11-28 MED ORDER — KETAMINE HCL 10 MG/ML IJ SOLN
INTRAMUSCULAR | Status: DC | PRN
  Administered 2018-11-28: 10 mg via INTRAVENOUS

## 2018-11-28 MED ORDER — PROPOFOL 10 MG/ML IV BOLUS
INTRAVENOUS | Status: AC
  Filled 2018-11-28: qty 40

## 2018-11-28 MED ORDER — HYDROMORPHONE HCL 1 MG/ML IJ SOLN: 0.2500 mg | INTRAMUSCULAR | Status: DC | PRN

## 2018-11-28 MED ORDER — SIMETHICONE 40 MG/0.6ML PO SUSP
ORAL | Status: AC
  Filled 2018-11-28: qty 0.6

## 2018-11-28 MED ORDER — PROMETHAZINE HCL 25 MG/ML IJ SOLN: 6.2500 mg | INTRAMUSCULAR | Status: DC | PRN

## 2018-11-28 MED ORDER — SPOT INK MARKER SYRINGE KIT: PACK | SUBMUCOSAL | Status: AC

## 2018-11-28 MED ORDER — EPINEPHRINE PF 1 MG/10ML IJ SOSY
PREFILLED_SYRINGE | INTRAMUSCULAR | Status: AC
  Filled 2018-11-28: qty 10

## 2018-11-28 MED ORDER — STERILE WATER FOR IRRIGATION IR SOLN
Status: DC | PRN
  Administered 2018-11-28: 100 mL

## 2018-11-28 MED ORDER — KETAMINE HCL 50 MG/5ML IJ SOSY
PREFILLED_SYRINGE | INTRAMUSCULAR | Status: AC
  Filled 2018-11-28: qty 5

## 2018-11-28 MED ORDER — PROPOFOL 10 MG/ML IV BOLUS
INTRAVENOUS | Status: DC | PRN
  Administered 2018-11-28 (×5): 10 mg via INTRAVENOUS
  Administered 2018-11-28 (×7): 20 mg via INTRAVENOUS
  Administered 2018-11-28: 10 mg via INTRAVENOUS

## 2018-11-28 NOTE — Anesthesia Postprocedure Evaluation (Signed)
Anesthesia Post Note  Patient: Jeff Miles  Procedure(s) Performed: COLONOSCOPY WITH PROPOFOL (N/A ) POLYPECTOMY  Patient location during evaluation: PACU Anesthesia Type: General Level of consciousness: awake and alert and oriented Pain management: pain level controlled Vital Signs Assessment: post-procedure vital signs reviewed and stable Respiratory status: spontaneous breathing Cardiovascular status: blood pressure returned to baseline and stable Postop Assessment: no apparent nausea or vomiting Anesthetic complications: no     Last Vitals:  Vitals:   11/28/18 0640  BP: (!) 178/103  Pulse: (!) 106  Resp: (!) 21  Temp: 37.2 C  SpO2: 94%    Last Pain:  Vitals:   11/28/18 0907  TempSrc:   PainSc: 0-No pain                 Zendaya Groseclose

## 2018-11-28 NOTE — Op Note (Signed)
[facility] Patient Name: Jeff Miles Procedure Date: 11/28/2018 7:25 AM MRN: [id number] Date of Birth: [birth year] Attending MD: Barney Drain MD, MD CSN: [id number] Age: 68 Admit Type: Outpatient Procedure:                Colonoscopy WITH EMR/COLD SNARE/SNARE CAUTERY                            POLYPECTOMY/CLIPS x4/SPOT TATTOO Indications:              Hematochezia Providers:                Barney Drain MD, MD, Rosina Lowenstein, RN, Randa Spike, Technician Referring MD:             Edwinna Areola. Hall MD Medicines:                Propofol per Anesthesia Complications:            No immediate complications. Estimated Blood Loss:     Estimated blood loss was minimal. Procedure:                Pre-Anesthesia Assessment:                           - Prior to the procedure, a History and Physical                            was performed, and patient medications and                            allergies were reviewed. The patient's tolerance of                            previous anesthesia was also reviewed. The risks                            and benefits of the procedure and the sedation                            options and risks were discussed with the patient.                            All questions were answered, and informed consent                            was obtained. Prior Anticoagulants: The patient has                            taken no previous anticoagulant or antiplatelet                            agents. ASA Grade Assessment: II - A patient with  mild systemic disease. After reviewing the risks                            and benefits, the patient was deemed in                            satisfactory condition to undergo the procedure.                            After obtaining informed consent, the colonoscope                            was passed under direct vision. Throughout the   procedure, the patient's blood pressure, pulse, and                            oxygen saturations were monitored continuously. The                            CF-HQ190L ([id number]) scope was introduced through                            the anus and advanced to the the cecum, identified                            by appendiceal orifice and ileocecal valve. The                            colonoscopy was technically difficult and complex                            due to a tortuous colon. Successful completion of                            the procedure was aided by straightening and                            shortening the scope to obtain bowel loop reduction                            and COLOWRAP. The patient tolerated the procedure                            well. The quality of the bowel preparation was                            good. The ileocecal valve, appendiceal orifice, and                            rectum were photographed. Scope In: 7:55:10 AM Scope Out: 9:05:52 AM Scope Withdrawal Time: 1 hour 7 minutes 5 seconds  Total Procedure Duration: 1 hour 10 minutes 42 seconds  Findings:      13 sessile polyps were found in the rectum(4), sigmoid colon,  descending       colon(3), ascending colon(4) and cecum. The polyps were 2 to 6 mm in       size. These polyps were removed with a cold snare. Resection and       retrieval were complete.      A 20 to 30 mm polyp was found in the hepatic flexure. The polyp was       carpet-like and flat. Preparations were made for mucosal resection. 3 mL       of a 1:10,000 solution of epinephrine was injected with partial lift of       the lesion from the muscularis propria. 3 ML OF ORISE WAS INJECTED TO       LIFT THE POLYP. COLD Snare PIECEMEAL mucosal resection was performed. A       26 x 30 mm area was resected. Resection and retrieval were complete.       SOFT SNARE COAG APPLIED TO EDGES OF POLYPECTOMY SITE AND ONE CENTRAL       PORTION WITH  OOZING. There was no bleeding at the end of the maneuver.       To prevent bleeding after the polypectomy, four hemostatic clips were       successfully placed (MR conditional). There was no bleeding at the end       of the procedure. 1 CC SPOT INJECTED ONTEH OPPOSING WALL TO MARK AREA OF       POLYPECTOMY.      A 20 to 30 mm polyp was found in the mid descending colon. The polyp was       pedunculated. Area was tattooed with an injection of 1 mL of Spot       (carbon black). Area was successfully injected with 1 mL of a 1:10,000       solution of epinephrine for drug delivery. The polyp was removed with a       hot snare. Resection and retrieval were complete. To prevent bleeding       after the polypectomy, one hemostatic clip was successfully placed (MR       conditional). There was no bleeding at the end of the procedure.      Multiple small and large-mouthed diverticula were found in the       recto-sigmoid colon, sigmoid colon and descending colon.      External and internal hemorrhoids were found.      The recto-sigmoid colon, sigmoid colon and descending colon were       moderately tortuous.      -UNABLE TO INTUBATE ILEUM DUE TO TORTUOUS COLON. Impression:               - 13 2 to 6 mm polyps in the rectum, in the sigmoid                            colon, in the descending colon, in the ascending                            colon and in the cecum, removed with a cold snare.                            Resected and retrieved.                           -  One 20 to 30 mm polyp at the hepatic flexure,                            removed with mucosal resection. Resected and                            retrieved. Clips (MR conditional) were placed.                           - One 20 to 30 mm polyp in the mid descending                            colon, removed with a hot snare. Resected and                            retrieved. Tattooed. Injected.                           - MODERATE  Diverticulosis in the recto-sigmoid                            colon, in the sigmoid colon and in the descending                            colon.                           - RECTAL BLEEDING/HEME POSITIVE STOOLS DUE TO                            internal hemorrhoids AND POLYPS                           - Tortuous LEFT colon. Moderate Sedation:      Per Anesthesia Care Recommendation:           - Patient has a contact number available for                            emergencies. The signs and symptoms of potential                            delayed complications were discussed with the                            patient. Return to normal activities tomorrow.                            Written discharge instructions were provided to the                            patient.                           - High fiber diet. NEED KUB PRIOR TO MRI TO CHECK  FOR CLIP RENTENTION.                           - Continue present medications. LOSE WEIGHT TO BMI                            < 30.                           - Await pathology results.                           - Repeat colonoscopy 1 YEAR IF DAVNCED CHANGES AND                            3 YEARS IF SIMPLE ADENOMAS for surveillance. Procedure Code(s):        --- Professional ---                           [phone], 59, Colonoscopy, flexible; with endoscopic                            mucosal resection                           [phone], Colonoscopy, flexible; with removal of                            tumor(s), polyp(s), or other lesion(s) by snare                            technique                           [id number], 56, Colonoscopy, flexible; with directed                            submucosal injection(s), any substance Diagnosis Code(s):        --- Professional ---                           K62.1, Rectal polyp                           D12.5, Benign neoplasm of sigmoid colon                           D12.4, Benign neoplasm  of descending colon                           D12.2, Benign neoplasm of ascending colon                           D12.0, Benign neoplasm of cecum                           D12.3, Benign neoplasm of transverse colon (hepatic  flexure or splenic flexure)                           K64.8, Other hemorrhoids                           K92.1, Melena (includes Hematochezia)                           K57.30, Diverticulosis of large intestine without                            perforation or abscess without bleeding                           Q43.8, Other specified congenital malformations of                            intestine CPT copyright 2018 [employer]. All rights reserved. The codes documented in this report are preliminary and upon coder review may  be revised to meet current compliance requirements. Barney Drain, MD Barney Drain MD, MD 11/28/2018 9:32:47 AM This report has been signed electronically. Number of Addenda: 0

## 2018-11-28 NOTE — Anesthesia Preprocedure Evaluation (Signed)
Anesthesia Evaluation  Patient identified by MRN, date of birth, ID band Patient awake    Reviewed: Allergy & Precautions, NPO status , Patient's Chart, lab work & pertinent test results  Airway Mallampati: II  TM Distance: >3 FB Neck ROM: Full    Dental no notable dental hx. (+) Poor Dentition   Pulmonary COPD,  COPD inhaler, former smoker,    Pulmonary exam normal breath sounds clear to auscultation       Cardiovascular Exercise Tolerance: Poor hypertension, +CHF  Normal cardiovascular examII Rhythm:Regular Rate:Normal     Neuro/Psych Dementia negative neurological ROS  negative psych ROS   GI/Hepatic negative GI ROS, (+) neg Cirrhosis      , H/o fatty liver   Endo/Other  negative endocrine ROSdiabetes, Type 2  Renal/GU negative Renal ROS  negative genitourinary   Musculoskeletal negative musculoskeletal ROS (+)   Abdominal   Peds negative pediatric ROS (+)  Hematology negative hematology ROS (+)   Anesthesia Other Findings   Reproductive/Obstetrics negative OB ROS                             Anesthesia Physical Anesthesia Plan  ASA: III  Anesthesia Plan: General   Post-op Pain Management:    Induction: Intravenous  PONV Risk Score and Plan:   Airway Management Planned: Nasal Cannula and Simple Face Mask  Additional Equipment:   Intra-op Plan:   Post-operative Plan: Extubation in OR  Informed Consent: I have reviewed the patients History and Physical, chart, labs and discussed the procedure including the risks, benefits and alternatives for the proposed anesthesia with the patient or authorized representative who has indicated his/her understanding and acceptance.     Dental advisory given  Plan Discussed with: CRNA  Anesthesia Plan Comments:         Anesthesia Quick Evaluation

## 2018-11-28 NOTE — Discharge Instructions (Signed)
YOUR RECTAL BLEEDING IS DUE TO POLYPS AND internal hemorrhoids. YOU HAVE diverticulosis IN YOUR LEFT COLON. YOU HAD 15 POLYPS REMOVED.    IF YOU NEED AN MRI, LET THE RADIOLOGY TECH KNOW THAT YOU HAD FOUR CLIPS PLACED IN YOUR COLON. THEY SHOULD FALL OFF IN 30 DAYS.   DRINK WATER TO KEEP YOUR URINE LIGHT YELLOW.  CONTINUE YOUR WEIGHT LOSS EFFORTS. YOUR BODY MASS INDEX IS OVER 30 WHICH MEANS YOU ARE OBESE. OBESITY IS ASSOCIATED WITH AN INCREASE FOR ALL CANCERS, INCLUDING ESOPHAGEAL AND COLON CANCER. A WEIGHT OF 200 LBS OR LESS  WILL GET YOUR BODY MASS INDEX(BMI) UNDER 30.   FOLLOW A HIGH FIBER DIET. AVOID ITEMS THAT CAUSE BLOATING. See info below.   USE PREPARATION H FOUR TIMES  A DAY IF NEEDED TO RELIEVE RECTAL PAIN/PRESSURE/BLEEDING.   YOUR BIOPSY RESULTS WILL BE BACK IN 5 BUSINESS DAYS.  Next colonoscopy in 1-3 years.  Colonoscopy Care After Read the instructions outlined below and refer to this sheet in the next week. These discharge instructions provide you with general information on caring for yourself after you leave the hospital. While your treatment has been planned according to the most current medical practices available, unavoidable complications occasionally occur. If you have any problems or questions after discharge, call DR. Shabreka Coulon, [phone].  ACTIVITY  You may resume your regular activity, but move at a slower pace for the next 24 hours.   Take frequent rest periods for the next 24 hours.   Walking will help get rid of the air and reduce the bloated feeling in your belly (abdomen).   No driving for 24 hours (because of the medicine (anesthesia) used during the test).   You may shower.   Do not sign any important legal documents or operate any machinery for 24 hours (because of the anesthesia used during the test).    NUTRITION  Drink plenty of fluids.   You may resume your normal diet as instructed by your doctor.   Begin with a light meal and progress  to your normal diet. Heavy or fried foods are harder to digest and may make you feel sick to your stomach (nauseated).   Avoid alcoholic beverages for 24 hours or as instructed.    MEDICATIONS  You may resume your normal medications.   WHAT YOU CAN EXPECT TODAY  Some feelings of bloating in the abdomen.   Passage of more gas than usual.   Spotting of blood in your stool or on the toilet paper  .  IF YOU HAD POLYPS REMOVED DURING THE COLONOSCOPY:  Eat a soft diet IF YOU HAVE NAUSEA, BLOATING, ABDOMINAL PAIN, OR VOMITING.    FINDING OUT THE RESULTS OF YOUR TEST Not all test results are available during your visit. DR. Oneida Alar WILL CALL YOU WITHIN 14 DAYS OF YOUR PROCEDUE WITH YOUR RESULTS. Do not assume everything is normal if you have not heard from DR. Josilynn Losh, CALL HER OFFICE AT [phone].  SEEK IMMEDIATE MEDICAL ATTENTION AND CALL THE OFFICE: [phone] IF:  You have more than a spotting of blood in your stool.   Your belly is swollen (abdominal distention).   You are nauseated or vomiting.   You have a temperature over 101F.   You have abdominal pain or discomfort that is severe or gets worse throughout the day.  High-Fiber Diet A high-fiber diet changes your normal diet to include more whole grains, legumes, fruits, and vegetables. Changes in the diet involve replacing refined carbohydrates with unrefined  foods. The calorie level of the diet is essentially unchanged. The Dietary Reference Intake (recommended amount) for adult males is 38 grams per day. For adult females, it is 25 grams per day. Pregnant and lactating women should consume 28 grams of fiber per day. Fiber is the intact part of a plant that is not broken down during digestion. Functional fiber is fiber that has been isolated from the plant to provide a beneficial effect in the body.  PURPOSE  Increase stool bulk.   Ease and regulate bowel movements.   Lower cholesterol.   REDUCE RISK OF COLON  CANCER  INDICATIONS THAT YOU NEED MORE FIBER  Constipation and hemorrhoids.   Uncomplicated diverticulosis (intestine condition) and irritable bowel syndrome.   Weight management.   As a protective measure against hardening of the arteries (atherosclerosis), diabetes, and cancer.   GUIDELINES FOR INCREASING FIBER IN THE DIET  Start adding fiber to the diet slowly. A gradual increase of about 5 more grams (2 slices of whole-wheat bread, 2 servings of most fruits or vegetables, or 1 bowl of high-fiber cereal) per day is best. Too rapid an increase in fiber may result in constipation, flatulence, and bloating.   Drink enough water and fluids to keep your urine clear or pale yellow. Water, juice, or caffeine-free drinks are recommended. Not drinking enough fluid may cause constipation.   Eat a variety of high-fiber foods rather than one type of fiber.   Try to increase your intake of fiber through using high-fiber foods rather than fiber pills or supplements that contain small amounts of fiber.   The goal is to change the types of food eaten. Do not supplement your present diet with high-fiber foods, but replace foods in your present diet.   INCLUDE A VARIETY OF FIBER SOURCES  Replace refined and processed grains with whole grains, canned fruits with fresh fruits, and incorporate other fiber sources. White rice, white breads, and most bakery goods contain little or no fiber.   Brown whole-grain rice, buckwheat oats, and many fruits and vegetables are all good sources of fiber. These include: broccoli, Brussels sprouts, cabbage, cauliflower, beets, sweet potatoes, white potatoes (skin on), carrots, tomatoes, eggplant, squash, berries, fresh fruits, and dried fruits.   Cereals appear to be the richest source of fiber. Cereal fiber is found in whole grains and bran. Bran is the fiber-rich outer coat of cereal grain, which is largely removed in refining. In whole-grain cereals, the bran  remains. In breakfast cereals, the largest amount of fiber is found in those with "bran" in their names. The fiber content is sometimes indicated on the label.   You may need to include additional fruits and vegetables each day.   In baking, for 1 cup white flour, you may use the following substitutions:   1 cup whole-wheat flour minus 2 tablespoons.   1/2 cup white flour plus 1/2 cup whole-wheat flour.   Polyps, Colon  A polyp is extra tissue that grows inside your body. Colon polyps grow in the large intestine. The large intestine, also called the colon, is part of your digestive system. It is a long, hollow tube at the end of your digestive tract where your body makes and stores stool. Most polyps are not dangerous. They are benign. This means they are not cancerous. But over time, some types of polyps can turn into cancer. Polyps that are smaller than a pea are usually not harmful. But larger polyps could someday become or may already be cancerous.  To be safe, doctors remove all polyps and test them.   PREVENTION There is not one sure way to prevent polyps. You might be able to lower your risk of getting them if you:  Eat more fruits and vegetables and less fatty food.   Do not smoke.   Avoid alcohol.   Exercise every day.   Lose weight if you are overweight.   Eating more calcium and folate can also lower your risk of getting polyps. Some foods that are rich in calcium are milk, cheese, and broccoli. Some foods that are rich in folate are chickpeas, kidney beans, and spinach.    Diverticulosis Diverticulosis is a common condition that develops when small pouches (diverticula) form in the wall of the colon. The risk of diverticulosis increases with age. It happens more often in people who eat a low-fiber diet. Most individuals with diverticulosis have no symptoms. Those individuals with symptoms usually experience belly (abdominal) pain, constipation, or loose stools  (diarrhea).  HOME CARE INSTRUCTIONS  Increase the amount of fiber in your diet as directed by your caregiver or dietician. This may reduce symptoms of diverticulosis.   Drink at least 6 to 8 glasses of water each day to prevent constipation.   Try not to strain when you have a bowel movement.   Avoiding nuts and seeds to prevent complications is NOT NECESSARY.   FOODS HAVING HIGH FIBER CONTENT INCLUDE:  Fruits. Apple, peach, pear, tangerine, raisins, prunes.   Vegetables. Brussels sprouts, asparagus, broccoli, cabbage, carrot, cauliflower, romaine lettuce, spinach, summer squash, tomato, winter squash, zucchini.   Starchy Vegetables. Baked beans, kidney beans, lima beans, split peas, lentils, potatoes (with skin).   Grains. Whole wheat bread, brown rice, bran flake cereal, plain oatmeal, white rice, shredded wheat, bran muffins.   SEEK IMMEDIATE MEDICAL CARE IF:  You develop increasing pain or severe bloating.   You have an oral temperature above 101F.   You develop vomiting or bowel movements that are bloody or black.   PATIENT INSTRUCTIONS POST-ANESTHESIA  IMMEDIATELY FOLLOWING SURGERY:  Do not drive or operate machinery for the first twenty four hours after surgery.  Do not make any important decisions for twenty four hours after surgery or while taking narcotic pain medications or sedatives.  If you develop intractable nausea and vomiting or a severe headache please notify your doctor immediately.  FOLLOW-UP:  Please make an appointment with your surgeon as instructed. You do not need to follow up with anesthesia unless specifically instructed to do so.  WOUND CARE INSTRUCTIONS (if applicable):  Keep a dry clean dressing on the anesthesia/puncture wound site if there is drainage.  Once the wound has quit draining you may leave it open to air.  Generally you should leave the bandage intact for twenty four hours unless there is drainage.  If the epidural site drains for more  than 36-48 hours please call the anesthesia department.  QUESTIONS?:  Please feel free to call your physician or the hospital operator if you have any questions, and they will be happy to assist you.

## 2018-11-28 NOTE — Transfer of Care (Signed)
Immediate Anesthesia Transfer of Care Note  Patient: Jeff Miles  Procedure(s) Performed: COLONOSCOPY WITH PROPOFOL (N/A ) POLYPECTOMY  Patient Location: PACU  Anesthesia Type:General  Level of Consciousness: awake  Airway & Oxygen Therapy: Patient Spontanous Breathing  Post-op Assessment: Report given to RN  Post vital signs: Reviewed and stable  Last Vitals:  Vitals Value Taken Time  BP 141/80 11/28/2018  9:15 AM  Temp    Pulse 97 11/28/2018  9:18 AM  Resp 18 11/28/2018  9:18 AM  SpO2 88 % 11/28/2018  9:18 AM  Vitals shown include unvalidated device data.  Last Pain:  Vitals:   11/28/18 0907  TempSrc:   PainSc: 0-No pain      Patients Stated Pain Goal: 5 (38/38/18 4037)  Complications: No apparent anesthesia complications

## 2018-11-28 NOTE — H&P (Signed)
Primary Care Physician:  Celene Squibb, MD Primary Gastroenterologist:  Dr. Oneida Alar  Pre-Procedure History & Physical: HPI:  Jeff Miles is a 68 y.o. male here for  BRBPR.   Past Medical History:  Diagnosis Date  . CHF (congestive heart failure) ([place])   . COPD (chronic obstructive pulmonary disease) ([place])   . 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 Diabetes ([place])   . History of kidney stones   . Hypertension   . Unsteady gait   . Wernicke's encephalopathy     Past Surgical History:  Procedure Laterality Date  . BACK SURGERY     two  . INNER EAR SURGERY     [age] surgery  . NOSE SURGERY     x2    Prior to Admission medications   Medication Sig Start Date End Date Taking? Authorizing Provider  albuterol (PROVENTIL) (2.5 MG/3ML) 0.083% nebulizer solution Take 2.5 mg by nebulization 3 (three) times daily.   Yes [provider]  Artificial Tear Solution (SOOTHE XP OP) Apply 1 drop to eye 2 (two) times daily.    Yes [provider]  fluticasone furoate-vilanterol (BREO ELLIPTA) 200-25 MCG/INH AEPB Inhale 1 puff into the lungs daily.   Yes [provider]  furosemide (LASIX) 40 MG tablet Take 1 tablet (40 mg total) by mouth daily. 11/11/15  Yes Barton Dubois, MD  glipiZIDE (GLUCOTROL XL) 5 MG 24 hr tablet Take 5 mg by mouth 2 (two) times daily.   Yes [provider]  loratadine (CLARITIN) 10 MG tablet Take 10 mg by mouth daily.   Yes [provider]  LORazepam (ATIVAN) 1 MG tablet Take 1 mg by mouth daily.    Yes [provider]  metFORMIN (GLUCOPHAGE) 500 MG tablet Take 500 mg by mouth 2 (two) times daily with a meal.   Yes [provider]  mirtazapine (REMERON) 15 MG tablet Take 15 mg by mouth at bedtime.   Yes [provider]  Multiple Vitamin (MULTIVITAMIN WITH MINERALS) TABS tablet Take 1 tablet by mouth daily. 11/11/15  Yes Barton Dubois, MD  naltrexone (DEPADE) 50 MG tablet Take 50 mg by mouth daily.    Yes [provider]  Omega-3 Fatty Acids (FISH OIL) 1000 MG CAPS Take 1,000 mg by mouth daily.    Yes [provider]  Omeprazole 20 MG TBDD Take 20 mg by mouth daily.    Yes [provider]  pravastatin (PRAVACHOL) 20 MG tablet Take 20 mg by mouth daily.   Yes [provider]  risperiDONE (RISPERDAL) 0.5 MG tablet Take 0.5 mg by mouth at bedtime.   Yes [provider]  Thiamine HCl (VITAMIN B-1) 250 MG tablet Take 250 mg by mouth daily.   Yes [provider]  vitamin B-12 1000 MCG tablet Take 1 tablet (1,000 mcg total) by mouth daily. 11/11/15  Yes Barton Dubois, MD    Allergies as of 08/31/2018  . (No Known Allergies)    Family History  Problem Relation Age of Onset  . Colon cancer Neg Hx   . Liver disease Neg Hx     Social History   Socioeconomic History  . Marital status: Married    Spouse name: Not on file  . Number of children: Not on file  . Years of education: Not on file  . Highest education level: Not on file  Occupational History  . Not on file  Social Needs  . Financial resource strain: Not on file  .  Food insecurity:    Worry: Not on file    Inability: Not on file  . Transportation needs:    Medical: Not on file    Non-medical: Not on file  Tobacco Use  . Smoking status: Former Smoker    Packs/day: 2.00    Years: 34.00    Pack years: 68.00    Types: Cigarettes    Last attempt to quit: 10/24/2015    Years since quitting: 3.0  . Smokeless tobacco: Never Used  Substance and Sexual Activity  . Alcohol use: Not Currently    Comment: None since 2017  . Drug use: No  . Sexual activity: Not Currently  Lifestyle  . Physical activity:    Days per week: Not on file    Minutes per session: Not on file  . Stress: Not on file  Relationships  . Social connections:    Talks on phone: Not on file    Gets together: Not on file    Attends religious service: Not on file    Active member of club or organization:  Not on file    Attends meetings of clubs or organizations: Not on file    Relationship status: Not on file  . Intimate partner violence:    Fear of current or ex partner: Not on file    Emotionally abused: Not on file    Physically abused: Not on file    Forced sexual activity: Not on file  Other Topics Concern  . Not on file  Social History Narrative  . Not on file    Review of Systems: See HPI, otherwise negative ROS   Physical Exam: BP (!) 178/103   Pulse (!) 106   Temp 98.9 F (37.2 C) (Oral)   Resp (!) 21   SpO2 94%  General:   Alert,  pleasant and cooperative in NAD Head:  Normocephalic and atraumatic. Neck:  Supple; Lungs:  Clear throughout to auscultation.    Heart:  Regular rate and rhythm. Abdomen:  Soft, nontender and nondistended. Normal bowel sounds, without guarding, and without rebound.   Neurologic:  Alert and  oriented x4;  grossly normal neurologically.  Impression/Plan:    BRBPR  PLAN: TCS TODAY DISCUSSED PROCEDURE, BENEFITS, & RISKS: < 1% chance of medication reaction, bleeding, perforation, ASPIRATION, or rupture of spleen/liver requiring surgery to fix it and missed polyps < 1 cm 10-20% of the time.

## 2018-12-02 ENCOUNTER — Telehealth: Payer: Self-pay | Admitting: Gastroenterology

## 2018-12-02 NOTE — Telephone Encounter (Signed)
PLEASE CALL PT. HE HAD TWO ADVANCED POLYPS, 12 SIMPLE ADENOMAS AND ONE HYPERPLASTIC POLYPS REMOVED.     IF YOU NEED AN MRI, LET THE RADIOLOGY TECH KNOW THAT YOU HAD FOUR CLIPS PLACED IN YOUR COLON. THEY SHOULD FALL OFF IN 30 DAYS.   DRINK WATER TO KEEP YOUR URINE LIGHT YELLOW.  CONTINUE YOUR WEIGHT LOSS EFFORTS.  A WEIGHT OF 200 LBS OR LESS  WILL GET YOUR BODY MASS INDEX(BMI) UNDER 30.   FOLLOW A HIGH FIBER DIET. AVOID ITEMS THAT CAUSE BLOATING.   USE PREPARATION H FOUR TIMES  A DAY IF NEEDED TO RELIEVE RECTAL PAIN/PRESSURE/BLEEDING.  Next colonoscopy in 3 years. YOUR SISTERS, BROTHERS, CHILDREN, AND PARENTS NEED TO HAVE A COLONOSCOPY STARTING AT THE AGE OF 40.

## 2018-12-04 NOTE — Telephone Encounter (Signed)
ON RECALL  °

## 2018-12-04 NOTE — Telephone Encounter (Signed)
PT is aware.

## 2018-12-05 ENCOUNTER — Encounter (HOSPITAL_COMMUNITY): Payer: Self-pay | Admitting: Gastroenterology

## 2018-12-19 DIAGNOSIS — F0390 Unspecified dementia without behavioral disturbance: Secondary | ICD-10-CM | POA: Diagnosis not present

## 2018-12-19 DIAGNOSIS — N183 Chronic kidney disease, stage 3 (moderate): Secondary | ICD-10-CM | POA: Diagnosis not present

## 2018-12-19 DIAGNOSIS — E782 Mixed hyperlipidemia: Secondary | ICD-10-CM | POA: Diagnosis not present

## 2018-12-19 DIAGNOSIS — E1122 Type 2 diabetes mellitus with diabetic chronic kidney disease: Secondary | ICD-10-CM | POA: Diagnosis not present

## 2018-12-21 DIAGNOSIS — E782 Mixed hyperlipidemia: Secondary | ICD-10-CM | POA: Diagnosis not present

## 2018-12-21 DIAGNOSIS — N182 Chronic kidney disease, stage 2 (mild): Secondary | ICD-10-CM | POA: Diagnosis not present

## 2018-12-21 DIAGNOSIS — F1026 Alcohol dependence with alcohol-induced persisting amnestic disorder: Secondary | ICD-10-CM | POA: Diagnosis not present

## 2018-12-21 DIAGNOSIS — J449 Chronic obstructive pulmonary disease, unspecified: Secondary | ICD-10-CM | POA: Diagnosis not present

## 2018-12-21 DIAGNOSIS — Z0001 Encounter for general adult medical examination with abnormal findings: Secondary | ICD-10-CM | POA: Diagnosis not present

## 2018-12-21 DIAGNOSIS — F0390 Unspecified dementia without behavioral disturbance: Secondary | ICD-10-CM | POA: Diagnosis not present

## 2018-12-21 DIAGNOSIS — E1122 Type 2 diabetes mellitus with diabetic chronic kidney disease: Secondary | ICD-10-CM | POA: Diagnosis not present

## 2018-12-21 DIAGNOSIS — G4700 Insomnia, unspecified: Secondary | ICD-10-CM | POA: Diagnosis not present

## 2018-12-21 DIAGNOSIS — I5032 Chronic diastolic (congestive) heart failure: Secondary | ICD-10-CM | POA: Diagnosis not present

## 2019-03-26 DIAGNOSIS — E785 Hyperlipidemia, unspecified: Secondary | ICD-10-CM | POA: Diagnosis not present

## 2019-03-26 DIAGNOSIS — N183 Chronic kidney disease, stage 3 (moderate): Secondary | ICD-10-CM | POA: Diagnosis not present

## 2019-03-26 DIAGNOSIS — E1122 Type 2 diabetes mellitus with diabetic chronic kidney disease: Secondary | ICD-10-CM | POA: Diagnosis not present

## 2019-03-26 DIAGNOSIS — E1165 Type 2 diabetes mellitus with hyperglycemia: Secondary | ICD-10-CM | POA: Diagnosis not present

## 2019-03-26 DIAGNOSIS — E782 Mixed hyperlipidemia: Secondary | ICD-10-CM | POA: Diagnosis not present

## 2019-04-02 DIAGNOSIS — G4700 Insomnia, unspecified: Secondary | ICD-10-CM | POA: Diagnosis not present

## 2019-04-02 DIAGNOSIS — I5032 Chronic diastolic (congestive) heart failure: Secondary | ICD-10-CM | POA: Diagnosis not present

## 2019-04-02 DIAGNOSIS — F1021 Alcohol dependence, in remission: Secondary | ICD-10-CM | POA: Diagnosis not present

## 2019-04-02 DIAGNOSIS — N182 Chronic kidney disease, stage 2 (mild): Secondary | ICD-10-CM | POA: Diagnosis not present

## 2019-04-02 DIAGNOSIS — J449 Chronic obstructive pulmonary disease, unspecified: Secondary | ICD-10-CM | POA: Diagnosis not present

## 2019-04-02 DIAGNOSIS — E782 Mixed hyperlipidemia: Secondary | ICD-10-CM | POA: Diagnosis not present

## 2019-04-02 DIAGNOSIS — F17210 Nicotine dependence, cigarettes, uncomplicated: Secondary | ICD-10-CM | POA: Diagnosis not present

## 2019-04-02 DIAGNOSIS — E1122 Type 2 diabetes mellitus with diabetic chronic kidney disease: Secondary | ICD-10-CM | POA: Diagnosis not present

## 2019-04-02 DIAGNOSIS — E512 Wernicke's encephalopathy: Secondary | ICD-10-CM | POA: Diagnosis not present

## 2019-04-04 DIAGNOSIS — Z23 Encounter for immunization: Secondary | ICD-10-CM | POA: Diagnosis not present

## 2019-06-15 DIAGNOSIS — I5032 Chronic diastolic (congestive) heart failure: Secondary | ICD-10-CM | POA: Diagnosis not present

## 2019-06-15 DIAGNOSIS — G4700 Insomnia, unspecified: Secondary | ICD-10-CM | POA: Diagnosis not present

## 2019-06-15 DIAGNOSIS — F17210 Nicotine dependence, cigarettes, uncomplicated: Secondary | ICD-10-CM | POA: Diagnosis not present

## 2019-06-15 DIAGNOSIS — E1122 Type 2 diabetes mellitus with diabetic chronic kidney disease: Secondary | ICD-10-CM | POA: Diagnosis not present

## 2019-06-15 DIAGNOSIS — J449 Chronic obstructive pulmonary disease, unspecified: Secondary | ICD-10-CM | POA: Diagnosis not present

## 2019-06-15 DIAGNOSIS — N182 Chronic kidney disease, stage 2 (mild): Secondary | ICD-10-CM | POA: Diagnosis not present

## 2019-06-15 DIAGNOSIS — E782 Mixed hyperlipidemia: Secondary | ICD-10-CM | POA: Diagnosis not present

## 2019-06-15 DIAGNOSIS — F1021 Alcohol dependence, in remission: Secondary | ICD-10-CM | POA: Diagnosis not present

## 2019-06-15 DIAGNOSIS — E512 Wernicke's encephalopathy: Secondary | ICD-10-CM | POA: Diagnosis not present

## 2019-08-02 DIAGNOSIS — F17210 Nicotine dependence, cigarettes, uncomplicated: Secondary | ICD-10-CM | POA: Diagnosis not present

## 2019-08-02 DIAGNOSIS — F1021 Alcohol dependence, in remission: Secondary | ICD-10-CM | POA: Diagnosis not present

## 2019-08-02 DIAGNOSIS — J449 Chronic obstructive pulmonary disease, unspecified: Secondary | ICD-10-CM | POA: Diagnosis not present

## 2019-08-02 DIAGNOSIS — E512 Wernicke's encephalopathy: Secondary | ICD-10-CM | POA: Diagnosis not present

## 2019-08-02 DIAGNOSIS — N182 Chronic kidney disease, stage 2 (mild): Secondary | ICD-10-CM | POA: Diagnosis not present

## 2019-08-02 DIAGNOSIS — E1122 Type 2 diabetes mellitus with diabetic chronic kidney disease: Secondary | ICD-10-CM | POA: Diagnosis not present

## 2019-08-02 DIAGNOSIS — E782 Mixed hyperlipidemia: Secondary | ICD-10-CM | POA: Diagnosis not present

## 2019-08-02 DIAGNOSIS — G4700 Insomnia, unspecified: Secondary | ICD-10-CM | POA: Diagnosis not present

## 2019-08-02 DIAGNOSIS — I5032 Chronic diastolic (congestive) heart failure: Secondary | ICD-10-CM | POA: Diagnosis not present

## 2019-08-28 ENCOUNTER — Telehealth: Payer: Self-pay | Admitting: Gastroenterology

## 2019-08-28 ENCOUNTER — Encounter: Payer: Self-pay | Admitting: *Deleted

## 2019-08-28 NOTE — Telephone Encounter (Signed)
Recall sent 

## 2019-08-28 NOTE — Telephone Encounter (Signed)
Recall for ultrasound 

## 2019-09-28 DIAGNOSIS — D519 Vitamin B12 deficiency anemia, unspecified: Secondary | ICD-10-CM | POA: Diagnosis not present

## 2019-09-28 DIAGNOSIS — E1165 Type 2 diabetes mellitus with hyperglycemia: Secondary | ICD-10-CM | POA: Diagnosis not present

## 2019-09-28 DIAGNOSIS — E782 Mixed hyperlipidemia: Secondary | ICD-10-CM | POA: Diagnosis not present

## 2019-09-28 DIAGNOSIS — E785 Hyperlipidemia, unspecified: Secondary | ICD-10-CM | POA: Diagnosis not present

## 2019-09-28 DIAGNOSIS — E1122 Type 2 diabetes mellitus with diabetic chronic kidney disease: Secondary | ICD-10-CM | POA: Diagnosis not present

## 2019-09-28 DIAGNOSIS — Z125 Encounter for screening for malignant neoplasm of prostate: Secondary | ICD-10-CM | POA: Diagnosis not present

## 2019-09-28 DIAGNOSIS — N183 Chronic kidney disease, stage 3 unspecified: Secondary | ICD-10-CM | POA: Diagnosis not present

## 2019-09-28 DIAGNOSIS — Z1321 Encounter for screening for nutritional disorder: Secondary | ICD-10-CM | POA: Diagnosis not present

## 2019-10-03 DIAGNOSIS — N182 Chronic kidney disease, stage 2 (mild): Secondary | ICD-10-CM | POA: Diagnosis not present

## 2019-10-03 DIAGNOSIS — J449 Chronic obstructive pulmonary disease, unspecified: Secondary | ICD-10-CM | POA: Diagnosis not present

## 2019-10-03 DIAGNOSIS — E512 Wernicke's encephalopathy: Secondary | ICD-10-CM | POA: Diagnosis not present

## 2019-10-03 DIAGNOSIS — E1122 Type 2 diabetes mellitus with diabetic chronic kidney disease: Secondary | ICD-10-CM | POA: Diagnosis not present

## 2019-10-03 DIAGNOSIS — F1021 Alcohol dependence, in remission: Secondary | ICD-10-CM | POA: Diagnosis not present

## 2019-10-03 DIAGNOSIS — D3702 Neoplasm of uncertain behavior of tongue: Secondary | ICD-10-CM | POA: Diagnosis not present

## 2019-10-03 DIAGNOSIS — E782 Mixed hyperlipidemia: Secondary | ICD-10-CM | POA: Diagnosis not present

## 2019-10-03 DIAGNOSIS — G4700 Insomnia, unspecified: Secondary | ICD-10-CM | POA: Diagnosis not present

## 2019-10-03 DIAGNOSIS — I5032 Chronic diastolic (congestive) heart failure: Secondary | ICD-10-CM | POA: Diagnosis not present

## 2019-10-04 ENCOUNTER — Other Ambulatory Visit (HOSPITAL_COMMUNITY): Payer: Medicare HMO

## 2019-10-04 ENCOUNTER — Other Ambulatory Visit: Payer: Self-pay | Admitting: Otolaryngology

## 2019-10-04 ENCOUNTER — Encounter (HOSPITAL_BASED_OUTPATIENT_CLINIC_OR_DEPARTMENT_OTHER): Payer: Self-pay | Admitting: Otolaryngology

## 2019-10-04 ENCOUNTER — Other Ambulatory Visit: Payer: Self-pay

## 2019-10-04 ENCOUNTER — Encounter (HOSPITAL_COMMUNITY)
Admission: RE | Admit: 2019-10-04 | Discharge: 2019-10-04 | Disposition: A | Discharge status: Home / Self Care | Payer: Medicare HMO | Source: Ambulatory Visit | Attending: Otolaryngology | Admitting: Otolaryngology

## 2019-10-04 ENCOUNTER — Other Ambulatory Visit (HOSPITAL_COMMUNITY)
Admission: RE | Admit: 2019-10-04 | Discharge: 2019-10-04 | Disposition: A | Discharge status: Home / Self Care | Payer: Medicare HMO | Source: Ambulatory Visit | Attending: Otolaryngology | Admitting: Otolaryngology

## 2019-10-04 DIAGNOSIS — B078 Other viral warts: Secondary | ICD-10-CM | POA: Diagnosis not present

## 2019-10-04 DIAGNOSIS — C44519 Basal cell carcinoma of skin of other part of trunk: Secondary | ICD-10-CM | POA: Diagnosis not present

## 2019-10-04 DIAGNOSIS — Z01812 Encounter for preprocedural laboratory examination: Secondary | ICD-10-CM | POA: Diagnosis not present

## 2019-10-04 DIAGNOSIS — Z20822 Contact with and (suspected) exposure to covid-19: Secondary | ICD-10-CM | POA: Diagnosis not present

## 2019-10-04 DIAGNOSIS — C44612 Basal cell carcinoma of skin of right upper limb, including shoulder: Secondary | ICD-10-CM | POA: Diagnosis not present

## 2019-10-04 LAB — BASIC METABOLIC PANEL
Anion gap: 11 (ref 5–15)
BUN: 16 mg/dL (ref 8–23)
CO2: 30 mmol/L (ref 22–32)
Calcium: 9.7 mg/dL (ref 8.9–10.3)
Chloride: 97 mmol/L — ABNORMAL LOW (ref 98–111)
Creatinine, Ser: 1.22 mg/dL (ref 0.61–1.24)
GFR calc Af Amer: >60 mL/min (ref >60)
GFR calc non Af Amer: >60 mL/min (ref >60)
Glucose, Bld: 130 mg/dL — ABNORMAL HIGH (ref 70–99)
Potassium: 3.9 mmol/L (ref 3.5–5.1)
Sodium: 138 mmol/L (ref 135–145)

## 2019-10-04 LAB — SARS CORONAVIRUS 2 (TAT 6-24 HRS): SARS Coronavirus 2: NEGATIVE

## 2019-10-08 ENCOUNTER — Ambulatory Visit (HOSPITAL_BASED_OUTPATIENT_CLINIC_OR_DEPARTMENT_OTHER): Payer: Medicare HMO | Admitting: Anesthesiology

## 2019-10-08 ENCOUNTER — Encounter (HOSPITAL_BASED_OUTPATIENT_CLINIC_OR_DEPARTMENT_OTHER): Payer: Self-pay | Admitting: Otolaryngology

## 2019-10-08 ENCOUNTER — Other Ambulatory Visit: Payer: Self-pay

## 2019-10-08 ENCOUNTER — Encounter (HOSPITAL_BASED_OUTPATIENT_CLINIC_OR_DEPARTMENT_OTHER)
Admission: RE | Disposition: A | Discharge status: Home / Self Care | Payer: Self-pay | Source: Home / Self Care | Attending: Otolaryngology

## 2019-10-08 ENCOUNTER — Ambulatory Visit (HOSPITAL_BASED_OUTPATIENT_CLINIC_OR_DEPARTMENT_OTHER)
Admission: RE | Admit: 2019-10-08 | Discharge: 2019-10-08 | Disposition: A | Discharge status: Home / Self Care | Payer: Medicare HMO | Attending: Otolaryngology | Admitting: Otolaryngology

## 2019-10-08 DIAGNOSIS — E119 Type 2 diabetes mellitus without complications: Secondary | ICD-10-CM | POA: Diagnosis not present

## 2019-10-08 DIAGNOSIS — L980 Pyogenic granuloma: Secondary | ICD-10-CM | POA: Diagnosis not present

## 2019-10-08 DIAGNOSIS — Z87891 Personal history of nicotine dependence: Secondary | ICD-10-CM | POA: Insufficient documentation

## 2019-10-08 DIAGNOSIS — J449 Chronic obstructive pulmonary disease, unspecified: Secondary | ICD-10-CM | POA: Insufficient documentation

## 2019-10-08 DIAGNOSIS — I509 Heart failure, unspecified: Secondary | ICD-10-CM | POA: Insufficient documentation

## 2019-10-08 DIAGNOSIS — Z6837 Body mass index (BMI) 37.0-37.9, adult: Secondary | ICD-10-CM | POA: Insufficient documentation

## 2019-10-08 DIAGNOSIS — Z7984 Long term (current) use of oral hypoglycemic drugs: Secondary | ICD-10-CM | POA: Insufficient documentation

## 2019-10-08 DIAGNOSIS — K148 Other diseases of tongue: Secondary | ICD-10-CM | POA: Diagnosis not present

## 2019-10-08 DIAGNOSIS — J441 Chronic obstructive pulmonary disease with (acute) exacerbation: Secondary | ICD-10-CM | POA: Diagnosis not present

## 2019-10-08 DIAGNOSIS — I5033 Acute on chronic diastolic (congestive) heart failure: Secondary | ICD-10-CM | POA: Diagnosis not present

## 2019-10-08 DIAGNOSIS — D3702 Neoplasm of uncertain behavior of tongue: Secondary | ICD-10-CM | POA: Diagnosis not present

## 2019-10-08 DIAGNOSIS — I110 Hypertensive heart disease with heart failure: Secondary | ICD-10-CM | POA: Diagnosis not present

## 2019-10-08 HISTORY — PX: MASS EXCISION: SHX2000

## 2019-10-08 LAB — GLUCOSE, CAPILLARY: Glucose-Capillary: 167 mg/dL — ABNORMAL HIGH (ref 70–99)

## 2019-10-08 SURGERY — EXCISION MASS
Anesthesia: Monitor Anesthesia Care | Site: Mouth

## 2019-10-08 MED ORDER — HYDROCODONE-ACETAMINOPHEN 5-325 MG PO TABS: 1.0000 | ORAL_TABLET | ORAL | 0 refills | Status: AC | PRN

## 2019-10-08 MED ORDER — FENTANYL CITRATE (PF) 100 MCG/2ML IJ SOLN: 25.0000 ug | INTRAMUSCULAR | Status: DC | PRN

## 2019-10-08 MED ORDER — CEFAZOLIN SODIUM-DEXTROSE 2-3 GM-%(50ML) IV SOLR
INTRAVENOUS | Status: DC | PRN
  Administered 2019-10-08: 2 g via INTRAVENOUS

## 2019-10-08 MED ORDER — LIDOCAINE 2% (20 MG/ML) 5 ML SYRINGE
INTRAMUSCULAR | Status: DC | PRN
  Administered 2019-10-08: 60 mg via INTRAVENOUS

## 2019-10-08 MED ORDER — PROPOFOL 10 MG/ML IV BOLUS
INTRAVENOUS | Status: DC | PRN
  Administered 2019-10-08 (×3): 10 ug via INTRAVENOUS

## 2019-10-08 MED ORDER — LACTATED RINGERS IV SOLN: INTRAVENOUS | Status: DC

## 2019-10-08 MED ORDER — LIDOCAINE-EPINEPHRINE 1 %-1:100000 IJ SOLN
INTRAMUSCULAR | Status: DC | PRN
  Administered 2019-10-08: 2 mL

## 2019-10-08 SURGICAL SUPPLY — 36 items
BLADE SURG 15 STRL LF DISP TIS (BLADE) ×1 IMPLANT
BLADE SURG 15 STRL SS (BLADE) ×3
CANISTER SUCT 1200ML W/VALVE (MISCELLANEOUS) ×3 IMPLANT
COVER MAYO STAND STRL (DRAPES) ×3 IMPLANT
COVER WAND RF STERILE (DRAPES) IMPLANT
DECANTER SPIKE VIAL GLASS SM (MISCELLANEOUS) IMPLANT
DEPRESSOR TONGUE BLADE STERILE (MISCELLANEOUS) IMPLANT
ELECT COATED BLADE 2.86 ST (ELECTRODE) ×3 IMPLANT
ELECT NEEDLE BLADE 2-5/6 (NEEDLE) ×3 IMPLANT
ELECT REM PT RETURN 9FT ADLT (ELECTROSURGICAL) ×3
ELECT REM PT RETURN 9FT PED (ELECTROSURGICAL)
ELECTRODE REM PT RETRN 9FT PED (ELECTROSURGICAL) IMPLANT
ELECTRODE REM PT RTRN 9FT ADLT (ELECTROSURGICAL) ×1 IMPLANT
GLOVE BIO SURGEON STRL SZ7.5 (GLOVE) ×3 IMPLANT
GLOVE BIOGEL PI IND STRL 7.0 (GLOVE) ×1 IMPLANT
GLOVE BIOGEL PI INDICATOR 7.0 (GLOVE) ×2
GLOVE ECLIPSE 6.5 STRL STRAW (GLOVE) ×3 IMPLANT
GOWN STRL REUS W/ TWL LRG LVL3 (GOWN DISPOSABLE) ×2 IMPLANT
GOWN STRL REUS W/TWL LRG LVL3 (GOWN DISPOSABLE) ×6
NEEDLE PRECISIONGLIDE 27X1.5 (NEEDLE) ×3 IMPLANT
PACK BASIN DAY SURGERY FS (CUSTOM PROCEDURE TRAY) ×3 IMPLANT
PENCIL FOOT CONTROL (ELECTRODE) IMPLANT
PENCIL SMOKE EVACUATOR (MISCELLANEOUS) ×3 IMPLANT
SHEET MEDIUM DRAPE 40X70 STRL (DRAPES) ×3 IMPLANT
SUCTION FRAZIER HANDLE 10FR (MISCELLANEOUS) ×2
SUCTION TUBE FRAZIER 10FR DISP (MISCELLANEOUS) ×1 IMPLANT
SUT CHROMIC 5 0 RB 1 27 (SUTURE) IMPLANT
SUT SILK 3 0 TIES 17X18 (SUTURE)
SUT SILK 3-0 18XBRD TIE BLK (SUTURE) IMPLANT
SUT VIC AB 4-0 RB1 27 (SUTURE) ×3
SUT VIC AB 4-0 RB1 27X BRD (SUTURE) ×1 IMPLANT
SYR CONTROL 10ML LL (SYRINGE) ×3 IMPLANT
TOWEL GREEN STERILE FF (TOWEL DISPOSABLE) ×3 IMPLANT
TUBE CONNECTING 20'X1/4 (TUBING) ×1
TUBE CONNECTING 20X1/4 (TUBING) ×2 IMPLANT
YANKAUER SUCT BULB TIP NO VENT (SUCTIONS) IMPLANT

## 2019-10-08 NOTE — Op Note (Signed)
DATE OF PROCEDURE:  10/08/2019                              OPERATIVE REPORT  SURGEON:  Leta Baptist, MD  PREOPERATIVE DIAGNOSES: 1. Tongue mass  POSTOPERATIVE DIAGNOSES: 1. Tongue mass  PROCEDURE PERFORMED:  Excision of tongue mass (CPT [phone])  ANESTHESIA:  IV sedation with local anesthesia  COMPLICATIONS:  None.  ESTIMATED BLOOD LOSS:  Minimal.  INDICATION FOR PROCEDURE:   Jeff Miles is a 69 y.o. male with an enlarging anterior tongue mass.  The mass has caused him difficulty with oral intake.  On examination, he was noted to have a 1.5 cm fungating mass on the tip of his tongue.  Based on the above findings, the decision was made for patient to undergo the excision procedure.  The risks, benefits, alternatives, and details of the procedure were discussed with the patient.  Questions were invited and answered.  Informed consent was obtained.  DESCRIPTION:  The patient was taken to the operating room and placed supine on the operating table. IV sedation was administered by the anesthesiologist. 1% lidocaine with 1-100,000 epinephrine was infiltrated around the anterior tongue mass.    The patient was positioned and prepped and draped in the standard fashion for oral surgery.  Using a 15 blade, the 1.5 cm mass was excised, together with the surrounding mucosa.  The specimen was sent to the pathology department for permanent histologic identification.  The incision was closed with 4-0 Vicryl sutures.   The patient was awakened from sedation without difficulty.  The patient was transferred to the recovery room in good condition.  OPERATIVE FINDINGS: A 1.5 cm fungating anterior tongue mass.  SPECIMEN: Tongue mass.  FOLLOWUP CARE:  The patient will be discharged home once he is awake and alert.  Nikholas Geffre WOOI 10/08/2019

## 2019-10-08 NOTE — Discharge Instructions (Addendum)
The patient may resume all his previous activities and diet.  He may follow-up in my office as needed.   Post Anesthesia Home Care Instructions  Activity: Get plenty of rest for the remainder of the day. A responsible individual must stay with you for 24 hours following the procedure.  For the next 24 hours, DO NOT: -Drive a car -Paediatric nurse -Drink alcoholic beverages -Take any medication unless instructed by your physician -Make any legal decisions or sign important papers.  Meals: Start with liquid foods such as gelatin or soup. Progress to regular foods as tolerated. Avoid greasy, spicy, heavy foods. If nausea and/or vomiting occur, drink only clear liquids until the nausea and/or vomiting subsides. Call your physician if vomiting continues.  Special Instructions/Symptoms: Your throat may feel dry or sore from the anesthesia or the breathing tube placed in your throat during surgery. If this causes discomfort, gargle with warm salt water. The discomfort should disappear within 24 hours.  If you had a scopolamine patch placed behind your ear for the management of post- operative nausea and/or vomiting:  1. The medication in the patch is effective for 72 hours, after which it should be removed.  Wrap patch in a tissue and discard in the trash. Wash hands thoroughly with soap and water. 2. You may remove the patch earlier than 72 hours if you experience unpleasant side effects which may include dry mouth, dizziness or visual disturbances. 3. Avoid touching the patch. Wash your hands with soap and water after contact with the patch.

## 2019-10-08 NOTE — Transfer of Care (Signed)
Immediate Anesthesia Transfer of Care Note  Patient: Jeff Miles  Procedure(s) Performed: EXCISION MASS ON TONGUE (N/A Mouth)  Patient Location: PACU  Anesthesia Type:MAC  Level of Consciousness: awake  Airway & Oxygen Therapy: Patient Spontanous Breathing  Post-op Assessment: Report given to RN and Post -op Vital signs reviewed and stable  Post vital signs: Reviewed and stable  Last Vitals:  Vitals Value Taken Time  BP 120/92 10/08/19 0945  Temp 37.1 C 10/08/19 0945  Pulse 86 10/08/19 0945  Resp 20 10/08/19 0945  SpO2 98 % 10/08/19 0945    Last Pain:  Vitals:   10/08/19 0945  PainSc: 0-No pain      Patients Stated Pain Goal: 3 (92/01/00 7121)  Complications: No apparent anesthesia complications

## 2019-10-08 NOTE — Anesthesia Preprocedure Evaluation (Signed)
Anesthesia Evaluation  Patient identified by MRN, date of birth, ID band Patient awake    Reviewed: Allergy & Precautions, NPO status , Patient's Chart, lab work & pertinent test results  Airway Mallampati: IV  TM Distance: >3 FB Neck ROM: Limited    Dental  (+) Dental Advisory Given   Pulmonary COPD, former smoker,    breath sounds clear to auscultation       Cardiovascular hypertension, Pt. on medications +CHF   Rhythm:Regular Rate:Normal     Neuro/Psych negative neurological ROS     GI/Hepatic negative GI ROS, Neg liver ROS,   Endo/Other  diabetes, Type 2, Oral Hypoglycemic AgentsMorbid obesity  Renal/GU negative Renal ROS     Musculoskeletal   Abdominal   Peds  Hematology negative hematology ROS (+)   Anesthesia Other Findings   Reproductive/Obstetrics                             Anesthesia Physical Anesthesia Plan  ASA: III  Anesthesia Plan: MAC   Post-op Pain Management:    Induction: Intravenous  PONV Risk Score and Plan: 1 and Propofol infusion, Ondansetron and Treatment may vary due to age or medical condition  Airway Management Planned: Natural Airway and Nasal Cannula  Additional Equipment:   Intra-op Plan:   Post-operative Plan:   Informed Consent: I have reviewed the patients History and Physical, chart, labs and discussed the procedure including the risks, benefits and alternatives for the proposed anesthesia with the patient or authorized representative who has indicated his/her understanding and acceptance.       Plan Discussed with: CRNA  Anesthesia Plan Comments:         Anesthesia Quick Evaluation

## 2019-10-08 NOTE — Anesthesia Postprocedure Evaluation (Signed)
Anesthesia Post Note  Patient: Jeff Miles  Procedure(s) Performed: EXCISION MASS ON TONGUE (N/A Mouth)     Patient location during evaluation: PACU Anesthesia Type: MAC Level of consciousness: awake and alert Pain management: pain level controlled Vital Signs Assessment: post-procedure vital signs reviewed and stable Respiratory status: spontaneous breathing, nonlabored ventilation, respiratory function stable and patient connected to nasal cannula oxygen Cardiovascular status: stable and blood pressure returned to baseline Postop Assessment: no apparent nausea or vomiting Anesthetic complications: no    Last Vitals:  Vitals:   10/08/19 0930 10/08/19 0945  BP: (!) 148/84 (!) 120/92  Pulse: 91 86  Resp: 19 20  Temp:  37.1 C  SpO2: 92% 98%    Last Pain:  Vitals:   10/08/19 0945  PainSc: 0-No pain                 Tiajuana Amass

## 2019-10-08 NOTE — H&P (Signed)
Cc: Tongue mass  HPI: The patient is a 69 year old male who presents today complaining of a tongue mass.  According to the patient, he first noted the mass 3 weeks ago.  It has slightly increased in size.  The mass has caused him difficulty with oral intake.  The patient quit the use of tobacco in 2017.  Before 2017, he smoked daily for 30+ years.  The patient has no previous surgery on his tongue.    The patient's review of systems (constitutional, eyes, ENT, cardiovascular, respiratory, GI, musculoskeletal, skin, neurologic, psychiatric, endocrine, hematologic, allergic) is noted in the ROS questionnaire.  It is reviewed with the patient.   Family health history: Diabetes, heart disease, hearing loss.  Major events: Nose surgery, back surgery, ear surgery.  Ongoing medical problems: COPD, congestive heart failure.  Social history: The patient is married.  He denies the use of tobacco, alcohol, or illegal drugs.  Exam: General: Communicates without difficulty, well nourished, no acute distress. Head: Normocephalic, no evidence injury, no tenderness, facial buttresses intact without stepoff. Face/sinus: No tenderness to palpation and percussion. Facial movement is normal and symmetric. Eyes: PERRL, EOMI. No scleral icterus, conjunctivae clear. Neuro: CN II exam reveals vision grossly intact.  No nystagmus at any point of gaze. Ears: Auricles well formed without lesions.  Ear canals are intact without mass or lesion.  No erythema or edema is appreciated.  The TMs are intact without fluid. Nose: External evaluation reveals normal support and skin without lesions.  Dorsum is intact.  Anterior rhinoscopy reveals pink mucosa over anterior aspect of inferior turbinates and intact septum.  No purulence noted. Oral:  Oral cavity and oropharynx are intact, symmetric, without erythema or edema.  A 1.5 cm fungating mass noted on the tip of his tongue. Neck: Full range of motion without pain.  There is no  significant lymphadenopathy.  No masses palpable.  Thyroid bed within normal limits to palpation.  Parotid glands and submandibular glands equal bilaterally without mass.  Trachea is midline. Neuro:  CN 2-12 grossly intact. Gait normal.   Assessment 1.  The patient is noted to have a 1.5 cm fungating mass on the tip of his tongue.  The patient also has poor dentition.  The rest of his oral cavity is normal.   Plan  1.  The physical exam findings are reviewed with the patient.  2.  Observation versus surgical excision is discussed.  The risks, benefits, and details of the procedure are reviewed with the patient.  3.  The patient would like to proceed with the excision procedure.

## 2019-10-09 ENCOUNTER — Encounter: Payer: Self-pay | Admitting: *Deleted

## 2019-10-09 LAB — SURGICAL PATHOLOGY

## 2019-11-15 ENCOUNTER — Ambulatory Visit: Payer: Medicare HMO | Attending: Internal Medicine

## 2019-11-15 DIAGNOSIS — Z23 Encounter for immunization: Secondary | ICD-10-CM | POA: Insufficient documentation

## 2019-11-15 NOTE — Progress Notes (Signed)
   Covid-19 Vaccination Clinic  Name:  Jeff Miles    MRN: [id number] DOB: December 22, 1950  11/15/2019  Mr. Jeff Miles was observed post Covid-19 immunization for 15 minutes without incident. He was provided with Vaccine Information Sheet and instruction to access the V-Safe system.   Mr. Jeff Miles was instructed to call 911 with any severe reactions post vaccine: Marland Kitchen Difficulty breathing  . Swelling of face and throat  . A fast heartbeat  . A bad rash all over body  . Dizziness and weakness   Immunizations Administered    Name Date Dose VIS Date Route   Moderna COVID-19 Vaccine 11/15/2019  8:13 AM 0.5 mL 08/14/2019 Intramuscular   Manufacturer: Moderna   Lot: [id number]   [id number]

## 2019-11-19 DIAGNOSIS — L980 Pyogenic granuloma: Secondary | ICD-10-CM | POA: Diagnosis not present

## 2019-11-19 DIAGNOSIS — Z85828 Personal history of other malignant neoplasm of skin: Secondary | ICD-10-CM | POA: Diagnosis not present

## 2019-11-19 DIAGNOSIS — Z08 Encounter for follow-up examination after completed treatment for malignant neoplasm: Secondary | ICD-10-CM | POA: Diagnosis not present

## 2019-12-06 DIAGNOSIS — L928 Other granulomatous disorders of the skin and subcutaneous tissue: Secondary | ICD-10-CM | POA: Diagnosis not present

## 2019-12-06 DIAGNOSIS — C44519 Basal cell carcinoma of skin of other part of trunk: Secondary | ICD-10-CM | POA: Diagnosis not present

## 2019-12-06 DIAGNOSIS — Z08 Encounter for follow-up examination after completed treatment for malignant neoplasm: Secondary | ICD-10-CM | POA: Diagnosis not present

## 2019-12-06 DIAGNOSIS — Z85828 Personal history of other malignant neoplasm of skin: Secondary | ICD-10-CM | POA: Diagnosis not present

## 2019-12-17 DIAGNOSIS — E782 Mixed hyperlipidemia: Secondary | ICD-10-CM | POA: Diagnosis not present

## 2019-12-17 DIAGNOSIS — F17210 Nicotine dependence, cigarettes, uncomplicated: Secondary | ICD-10-CM | POA: Diagnosis not present

## 2019-12-17 DIAGNOSIS — N182 Chronic kidney disease, stage 2 (mild): Secondary | ICD-10-CM | POA: Diagnosis not present

## 2019-12-17 DIAGNOSIS — J449 Chronic obstructive pulmonary disease, unspecified: Secondary | ICD-10-CM | POA: Diagnosis not present

## 2019-12-17 DIAGNOSIS — I5032 Chronic diastolic (congestive) heart failure: Secondary | ICD-10-CM | POA: Diagnosis not present

## 2019-12-17 DIAGNOSIS — E1122 Type 2 diabetes mellitus with diabetic chronic kidney disease: Secondary | ICD-10-CM | POA: Diagnosis not present

## 2019-12-17 DIAGNOSIS — F1021 Alcohol dependence, in remission: Secondary | ICD-10-CM | POA: Diagnosis not present

## 2019-12-17 DIAGNOSIS — E512 Wernicke's encephalopathy: Secondary | ICD-10-CM | POA: Diagnosis not present

## 2019-12-17 DIAGNOSIS — G4700 Insomnia, unspecified: Secondary | ICD-10-CM | POA: Diagnosis not present

## 2019-12-19 ENCOUNTER — Ambulatory Visit: Payer: Medicare HMO | Attending: Internal Medicine

## 2019-12-19 DIAGNOSIS — Z23 Encounter for immunization: Secondary | ICD-10-CM

## 2019-12-19 NOTE — Progress Notes (Signed)
   Covid-19 Vaccination Clinic  Name:  Jeff Miles    MRN: [id number] DOB: 05/17/1951  12/19/2019  [name] was observed post Covid-19 immunization for 15 minutes without incident. He was provided with Vaccine Information Sheet and instruction to access the V-Safe system.   [name] was instructed to call 911 with any severe reactions post vaccine: Marland Kitchen Difficulty breathing  . Swelling of face and throat  . A fast heartbeat  . A bad rash all over body  . Dizziness and weakness   Immunizations Administered    Name Date Dose VIS Date Route   Moderna COVID-19 Vaccine 12/19/2019  8:19 AM 0.5 mL 08/14/2019 Intramuscular   Manufacturer: Levan Hurst   LotUD:[id number]   [place]:[id number]

## 2020-01-09 DIAGNOSIS — E119 Type 2 diabetes mellitus without complications: Secondary | ICD-10-CM | POA: Diagnosis not present

## 2020-01-17 DIAGNOSIS — Z85828 Personal history of other malignant neoplasm of skin: Secondary | ICD-10-CM | POA: Diagnosis not present

## 2020-01-17 DIAGNOSIS — Z08 Encounter for follow-up examination after completed treatment for malignant neoplasm: Secondary | ICD-10-CM | POA: Diagnosis not present

## 2020-01-22 DIAGNOSIS — J449 Chronic obstructive pulmonary disease, unspecified: Secondary | ICD-10-CM | POA: Diagnosis not present

## 2020-01-22 DIAGNOSIS — I5032 Chronic diastolic (congestive) heart failure: Secondary | ICD-10-CM | POA: Diagnosis not present

## 2020-01-22 DIAGNOSIS — E1122 Type 2 diabetes mellitus with diabetic chronic kidney disease: Secondary | ICD-10-CM | POA: Diagnosis not present

## 2020-01-22 DIAGNOSIS — E782 Mixed hyperlipidemia: Secondary | ICD-10-CM | POA: Diagnosis not present

## 2020-01-22 DIAGNOSIS — G4700 Insomnia, unspecified: Secondary | ICD-10-CM | POA: Diagnosis not present

## 2020-01-22 DIAGNOSIS — E512 Wernicke's encephalopathy: Secondary | ICD-10-CM | POA: Diagnosis not present

## 2020-01-22 DIAGNOSIS — F17210 Nicotine dependence, cigarettes, uncomplicated: Secondary | ICD-10-CM | POA: Diagnosis not present

## 2020-01-22 DIAGNOSIS — N182 Chronic kidney disease, stage 2 (mild): Secondary | ICD-10-CM | POA: Diagnosis not present

## 2020-01-22 DIAGNOSIS — F1021 Alcohol dependence, in remission: Secondary | ICD-10-CM | POA: Diagnosis not present

## 2020-02-25 DIAGNOSIS — I5032 Chronic diastolic (congestive) heart failure: Secondary | ICD-10-CM | POA: Diagnosis not present

## 2020-02-25 DIAGNOSIS — E1122 Type 2 diabetes mellitus with diabetic chronic kidney disease: Secondary | ICD-10-CM | POA: Diagnosis not present

## 2020-02-25 DIAGNOSIS — E512 Wernicke's encephalopathy: Secondary | ICD-10-CM | POA: Diagnosis not present

## 2020-02-25 DIAGNOSIS — G4700 Insomnia, unspecified: Secondary | ICD-10-CM | POA: Diagnosis not present

## 2020-02-25 DIAGNOSIS — N182 Chronic kidney disease, stage 2 (mild): Secondary | ICD-10-CM | POA: Diagnosis not present

## 2020-02-25 DIAGNOSIS — E782 Mixed hyperlipidemia: Secondary | ICD-10-CM | POA: Diagnosis not present

## 2020-02-25 DIAGNOSIS — F1021 Alcohol dependence, in remission: Secondary | ICD-10-CM | POA: Diagnosis not present

## 2020-02-25 DIAGNOSIS — F17210 Nicotine dependence, cigarettes, uncomplicated: Secondary | ICD-10-CM | POA: Diagnosis not present

## 2020-02-25 DIAGNOSIS — J449 Chronic obstructive pulmonary disease, unspecified: Secondary | ICD-10-CM | POA: Diagnosis not present

## 2020-02-28 DIAGNOSIS — E782 Mixed hyperlipidemia: Secondary | ICD-10-CM | POA: Diagnosis not present

## 2020-02-28 DIAGNOSIS — Z0001 Encounter for general adult medical examination with abnormal findings: Secondary | ICD-10-CM | POA: Diagnosis not present

## 2020-02-28 DIAGNOSIS — F0390 Unspecified dementia without behavioral disturbance: Secondary | ICD-10-CM | POA: Diagnosis not present

## 2020-02-28 DIAGNOSIS — E1122 Type 2 diabetes mellitus with diabetic chronic kidney disease: Secondary | ICD-10-CM | POA: Diagnosis not present

## 2020-02-28 DIAGNOSIS — E512 Wernicke's encephalopathy: Secondary | ICD-10-CM | POA: Diagnosis not present

## 2020-02-28 DIAGNOSIS — Z125 Encounter for screening for malignant neoplasm of prostate: Secondary | ICD-10-CM | POA: Diagnosis not present

## 2020-02-28 DIAGNOSIS — E785 Hyperlipidemia, unspecified: Secondary | ICD-10-CM | POA: Diagnosis not present

## 2020-02-28 DIAGNOSIS — E1165 Type 2 diabetes mellitus with hyperglycemia: Secondary | ICD-10-CM | POA: Diagnosis not present

## 2020-02-28 DIAGNOSIS — B3749 Other urogenital candidiasis: Secondary | ICD-10-CM | POA: Diagnosis not present

## 2020-03-05 DIAGNOSIS — F1021 Alcohol dependence, in remission: Secondary | ICD-10-CM | POA: Diagnosis not present

## 2020-03-05 DIAGNOSIS — G4700 Insomnia, unspecified: Secondary | ICD-10-CM | POA: Diagnosis not present

## 2020-03-05 DIAGNOSIS — E1122 Type 2 diabetes mellitus with diabetic chronic kidney disease: Secondary | ICD-10-CM | POA: Diagnosis not present

## 2020-03-05 DIAGNOSIS — I5032 Chronic diastolic (congestive) heart failure: Secondary | ICD-10-CM | POA: Diagnosis not present

## 2020-03-05 DIAGNOSIS — E782 Mixed hyperlipidemia: Secondary | ICD-10-CM | POA: Diagnosis not present

## 2020-03-05 DIAGNOSIS — N182 Chronic kidney disease, stage 2 (mild): Secondary | ICD-10-CM | POA: Diagnosis not present

## 2020-03-05 DIAGNOSIS — Z0001 Encounter for general adult medical examination with abnormal findings: Secondary | ICD-10-CM | POA: Diagnosis not present

## 2020-03-05 DIAGNOSIS — E512 Wernicke's encephalopathy: Secondary | ICD-10-CM | POA: Diagnosis not present

## 2020-03-05 DIAGNOSIS — J449 Chronic obstructive pulmonary disease, unspecified: Secondary | ICD-10-CM | POA: Diagnosis not present

## 2020-03-28 DIAGNOSIS — E1122 Type 2 diabetes mellitus with diabetic chronic kidney disease: Secondary | ICD-10-CM | POA: Diagnosis not present

## 2020-03-28 DIAGNOSIS — E7849 Other hyperlipidemia: Secondary | ICD-10-CM | POA: Diagnosis not present

## 2020-03-28 DIAGNOSIS — N183 Chronic kidney disease, stage 3 unspecified: Secondary | ICD-10-CM | POA: Diagnosis not present

## 2020-03-28 DIAGNOSIS — I5020 Unspecified systolic (congestive) heart failure: Secondary | ICD-10-CM | POA: Diagnosis not present

## 2020-04-16 DIAGNOSIS — E782 Mixed hyperlipidemia: Secondary | ICD-10-CM | POA: Diagnosis not present

## 2020-04-16 DIAGNOSIS — N182 Chronic kidney disease, stage 2 (mild): Secondary | ICD-10-CM | POA: Diagnosis not present

## 2020-04-16 DIAGNOSIS — J449 Chronic obstructive pulmonary disease, unspecified: Secondary | ICD-10-CM | POA: Diagnosis not present

## 2020-04-16 DIAGNOSIS — F1021 Alcohol dependence, in remission: Secondary | ICD-10-CM | POA: Diagnosis not present

## 2020-04-16 DIAGNOSIS — I5032 Chronic diastolic (congestive) heart failure: Secondary | ICD-10-CM | POA: Diagnosis not present

## 2020-04-16 DIAGNOSIS — E1122 Type 2 diabetes mellitus with diabetic chronic kidney disease: Secondary | ICD-10-CM | POA: Diagnosis not present

## 2020-04-16 DIAGNOSIS — G4700 Insomnia, unspecified: Secondary | ICD-10-CM | POA: Diagnosis not present

## 2020-04-16 DIAGNOSIS — E512 Wernicke's encephalopathy: Secondary | ICD-10-CM | POA: Diagnosis not present

## 2020-04-16 DIAGNOSIS — F17210 Nicotine dependence, cigarettes, uncomplicated: Secondary | ICD-10-CM | POA: Diagnosis not present

## 2020-05-22 IMAGING — US US ABDOMEN COMPLETE
1 series · 13 of 25 positions shown · non-contrast
Comparison: None.

CLINICAL DATA: Abnormal LFTs, history of L all abuse, diabetes
mellitus, hypertension, CHF, COPD, Wernicke's encephalopathy

EXAM:
ABDOMEN ULTRASOUND COMPLETE

[Series 1: us abdomen complete · 13 of 105 slices shown]
[im 1/105]
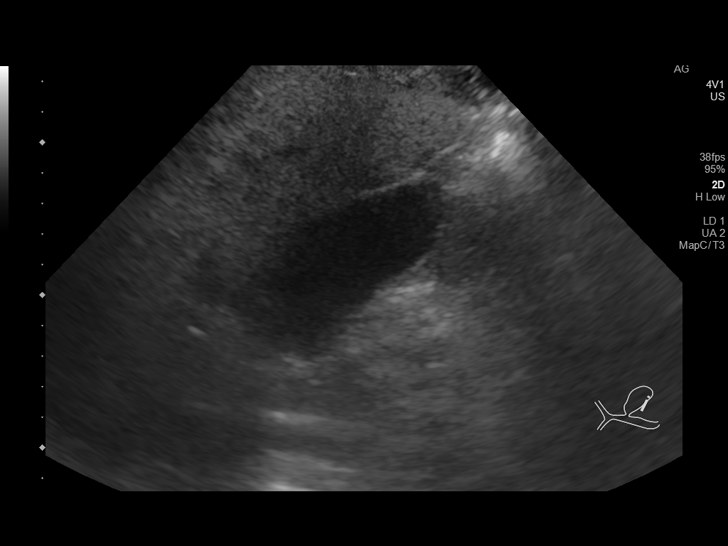
[im 9/105]
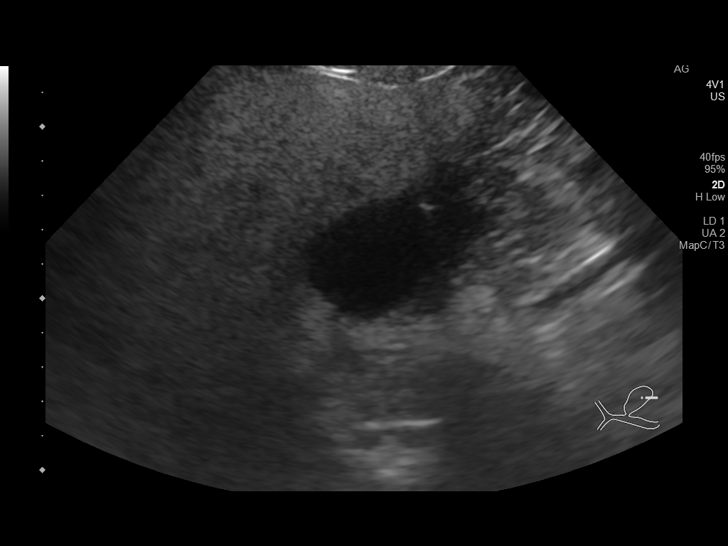
[im 18/105]
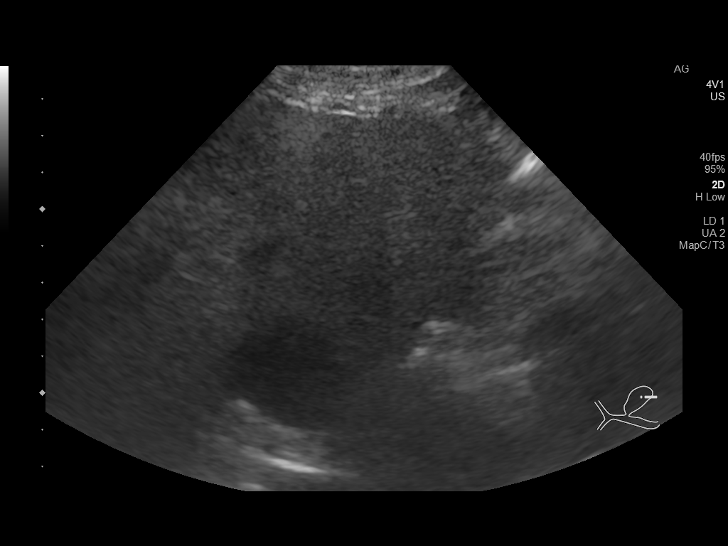
[im 27/105]
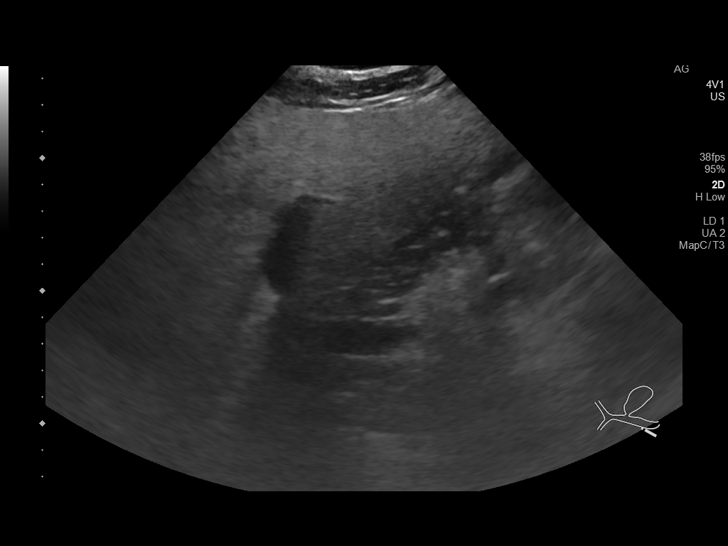
[im 35/105]
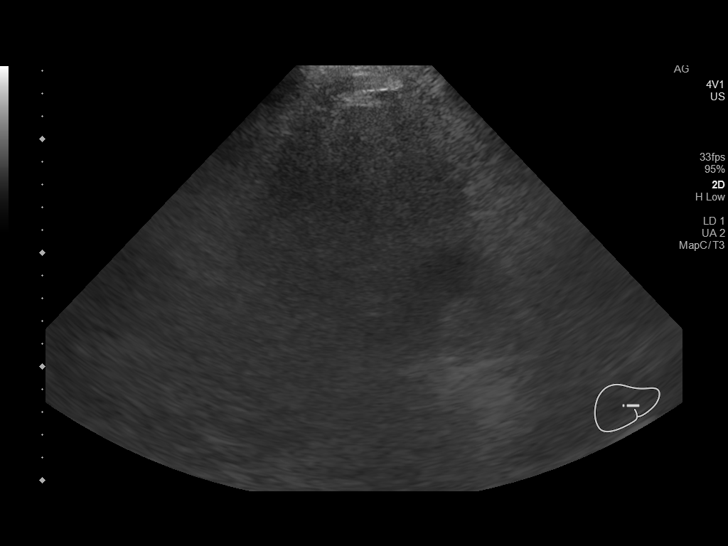
[im 44/105]
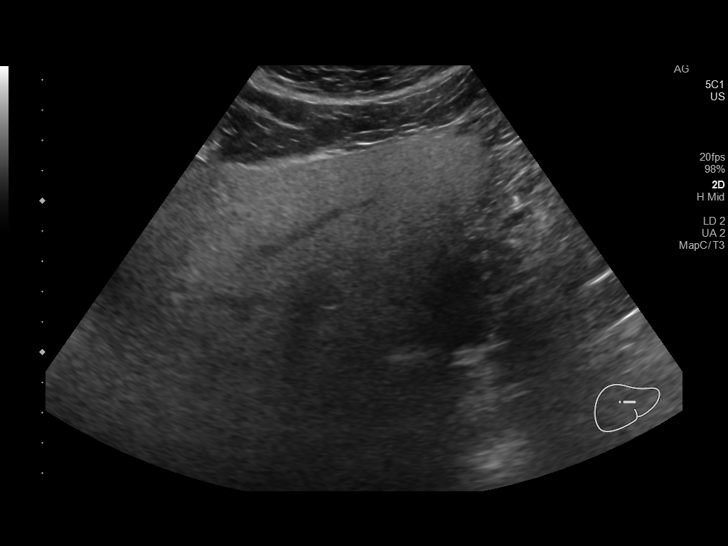
[im 53/105]
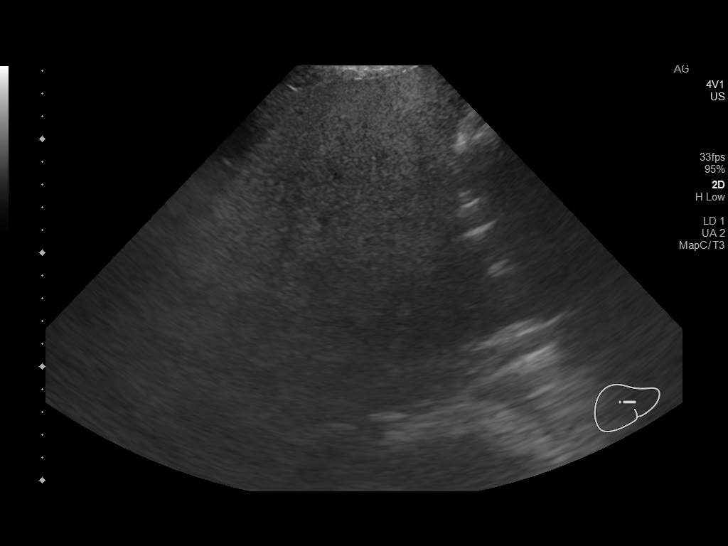
[im 61/105]
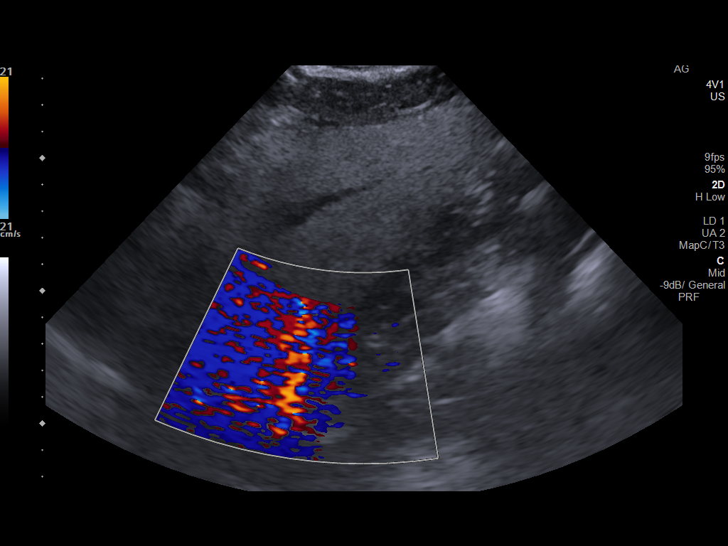
[im 70/105]
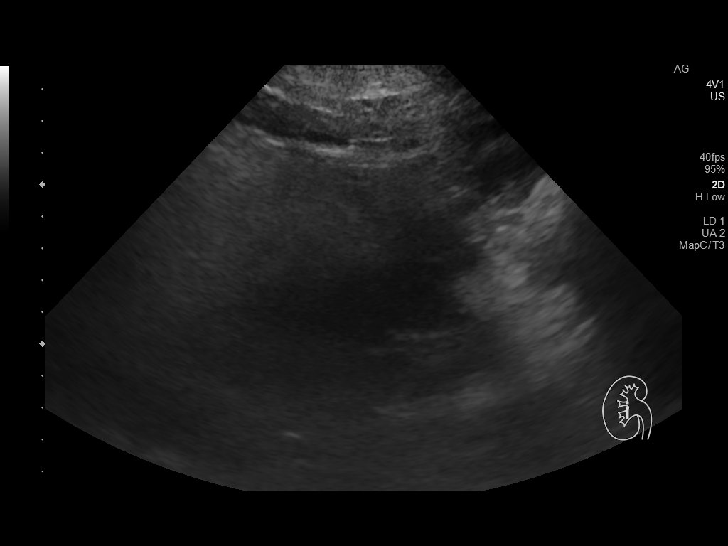
[im 79/105]
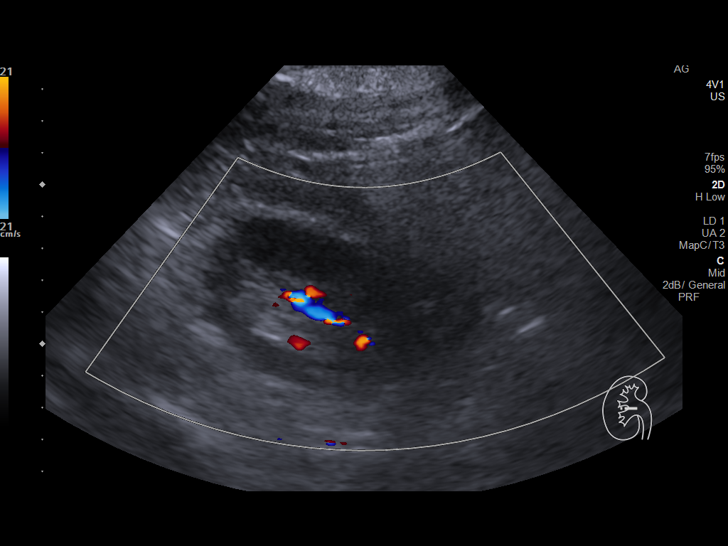
[im 87/105]
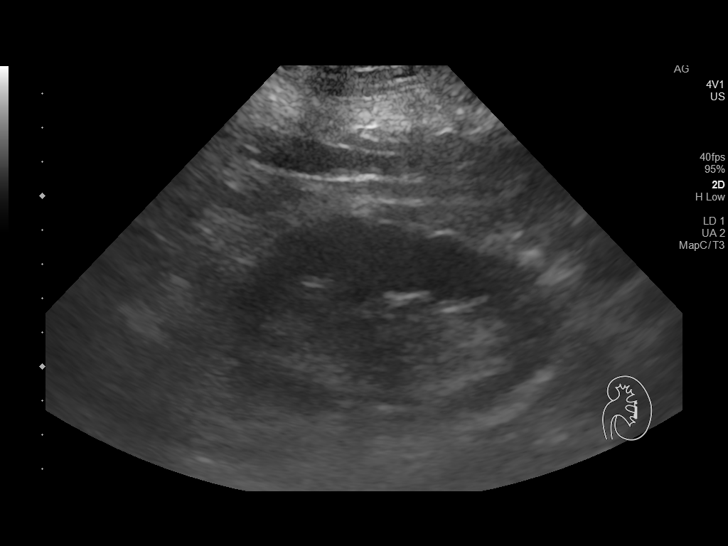
[im 96/105]
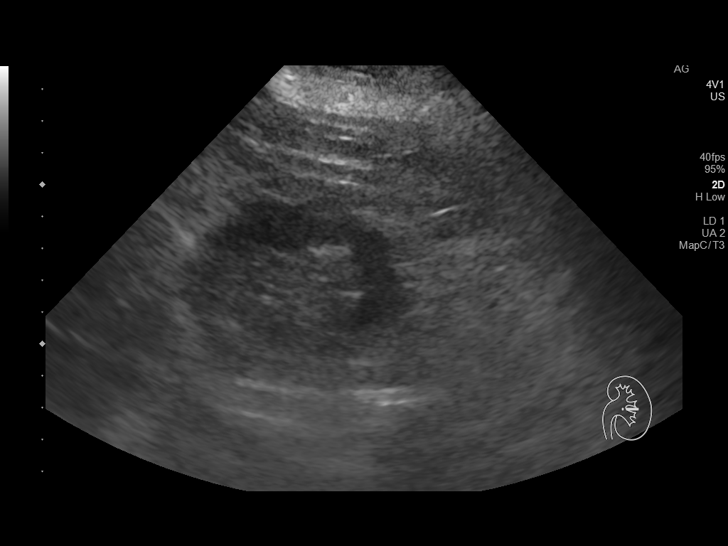
[im 105/105]
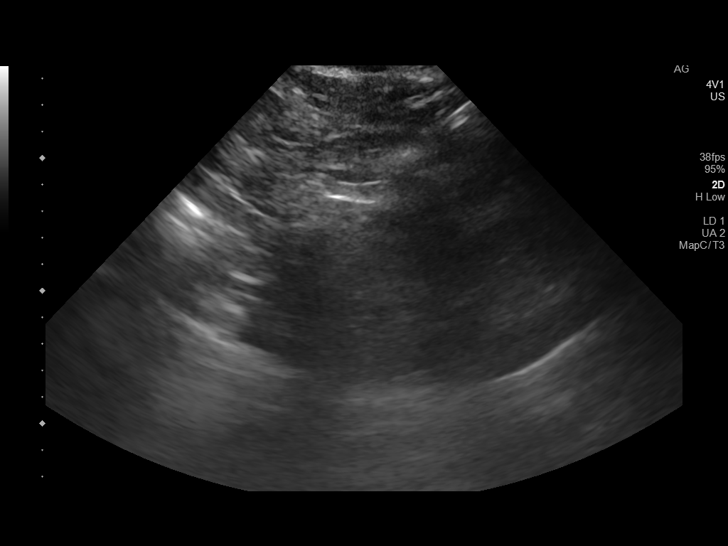

[13 of 25 positions shown; findings below may reference images not displayed]

FINDINGS: Gallbladder: Normally distended without stones or wall thickening.
No pericholecystic fluid or sonographic Murphy sign.

Common bile duct: Diameter: 3 mm diameter, normal

Liver: Echogenic parenchyma, question fatty infiltration versus
cirrhosis. No focal hepatic mass or nodularity. Portal vein is
patent on color Doppler imaging with normal direction of blood flow
towards the liver.

IVC: Predominately obscured due to bowel gas and sound attenuation
by echogenic liver

Pancreas: Obscured by bowel gas

Spleen: Normal appearance, 10.1 cm length

Right Kidney: Length: 11.5 cm. Normal morphology without mass or
hydronephrosis.

Left Kidney: Length: 11.5 cm. Normal morphology without mass or
hydronephrosis.

Abdominal aorta: Limited due to bowel gas

Other findings: No free fluid
IMPRESSION: Question fatty infiltration of liver versus cirrhosis.

Limited exam due to body habitus and bowel gas, with inadequate
visualization of the IVC, pancreas, and abdominal aorta.

No other definite abdominal sonographic abnormalities identified.

## 2020-05-27 DIAGNOSIS — J449 Chronic obstructive pulmonary disease, unspecified: Secondary | ICD-10-CM | POA: Diagnosis not present

## 2020-05-27 DIAGNOSIS — F1021 Alcohol dependence, in remission: Secondary | ICD-10-CM | POA: Diagnosis not present

## 2020-05-27 DIAGNOSIS — I5032 Chronic diastolic (congestive) heart failure: Secondary | ICD-10-CM | POA: Diagnosis not present

## 2020-05-27 DIAGNOSIS — G4700 Insomnia, unspecified: Secondary | ICD-10-CM | POA: Diagnosis not present

## 2020-05-27 DIAGNOSIS — F17210 Nicotine dependence, cigarettes, uncomplicated: Secondary | ICD-10-CM | POA: Diagnosis not present

## 2020-05-27 DIAGNOSIS — E1122 Type 2 diabetes mellitus with diabetic chronic kidney disease: Secondary | ICD-10-CM | POA: Diagnosis not present

## 2020-05-27 DIAGNOSIS — N182 Chronic kidney disease, stage 2 (mild): Secondary | ICD-10-CM | POA: Diagnosis not present

## 2020-05-27 DIAGNOSIS — E782 Mixed hyperlipidemia: Secondary | ICD-10-CM | POA: Diagnosis not present

## 2020-05-27 DIAGNOSIS — E512 Wernicke's encephalopathy: Secondary | ICD-10-CM | POA: Diagnosis not present

## 2020-06-24 DIAGNOSIS — F17210 Nicotine dependence, cigarettes, uncomplicated: Secondary | ICD-10-CM | POA: Diagnosis not present

## 2020-06-24 DIAGNOSIS — G4700 Insomnia, unspecified: Secondary | ICD-10-CM | POA: Diagnosis not present

## 2020-06-24 DIAGNOSIS — I5032 Chronic diastolic (congestive) heart failure: Secondary | ICD-10-CM | POA: Diagnosis not present

## 2020-06-24 DIAGNOSIS — E782 Mixed hyperlipidemia: Secondary | ICD-10-CM | POA: Diagnosis not present

## 2020-06-24 DIAGNOSIS — E1122 Type 2 diabetes mellitus with diabetic chronic kidney disease: Secondary | ICD-10-CM | POA: Diagnosis not present

## 2020-06-24 DIAGNOSIS — F1021 Alcohol dependence, in remission: Secondary | ICD-10-CM | POA: Diagnosis not present

## 2020-06-24 DIAGNOSIS — N182 Chronic kidney disease, stage 2 (mild): Secondary | ICD-10-CM | POA: Diagnosis not present

## 2020-06-24 DIAGNOSIS — J449 Chronic obstructive pulmonary disease, unspecified: Secondary | ICD-10-CM | POA: Diagnosis not present

## 2020-06-24 DIAGNOSIS — E512 Wernicke's encephalopathy: Secondary | ICD-10-CM | POA: Diagnosis not present

## 2020-07-04 DIAGNOSIS — Z125 Encounter for screening for malignant neoplasm of prostate: Secondary | ICD-10-CM | POA: Diagnosis not present

## 2020-07-04 DIAGNOSIS — Z0001 Encounter for general adult medical examination with abnormal findings: Secondary | ICD-10-CM | POA: Diagnosis not present

## 2020-07-04 DIAGNOSIS — M25551 Pain in right hip: Secondary | ICD-10-CM | POA: Diagnosis not present

## 2020-07-04 DIAGNOSIS — Z6839 Body mass index (BMI) 39.0-39.9, adult: Secondary | ICD-10-CM | POA: Diagnosis not present

## 2020-07-04 DIAGNOSIS — Z23 Encounter for immunization: Secondary | ICD-10-CM | POA: Diagnosis not present

## 2020-07-04 DIAGNOSIS — Z712 Person consulting for explanation of examination or test findings: Secondary | ICD-10-CM | POA: Diagnosis not present

## 2020-07-04 DIAGNOSIS — M25561 Pain in right knee: Secondary | ICD-10-CM | POA: Diagnosis not present

## 2020-07-04 DIAGNOSIS — E1122 Type 2 diabetes mellitus with diabetic chronic kidney disease: Secondary | ICD-10-CM | POA: Diagnosis not present

## 2020-07-04 DIAGNOSIS — F0390 Unspecified dementia without behavioral disturbance: Secondary | ICD-10-CM | POA: Diagnosis not present

## 2020-07-04 DIAGNOSIS — F17200 Nicotine dependence, unspecified, uncomplicated: Secondary | ICD-10-CM | POA: Diagnosis not present

## 2020-07-09 DIAGNOSIS — E512 Wernicke's encephalopathy: Secondary | ICD-10-CM | POA: Diagnosis not present

## 2020-07-09 DIAGNOSIS — Z23 Encounter for immunization: Secondary | ICD-10-CM | POA: Diagnosis not present

## 2020-07-09 DIAGNOSIS — E1122 Type 2 diabetes mellitus with diabetic chronic kidney disease: Secondary | ICD-10-CM | POA: Diagnosis not present

## 2020-07-09 DIAGNOSIS — E782 Mixed hyperlipidemia: Secondary | ICD-10-CM | POA: Diagnosis not present

## 2020-07-09 DIAGNOSIS — N182 Chronic kidney disease, stage 2 (mild): Secondary | ICD-10-CM | POA: Diagnosis not present

## 2020-07-09 DIAGNOSIS — I5032 Chronic diastolic (congestive) heart failure: Secondary | ICD-10-CM | POA: Diagnosis not present

## 2020-07-09 DIAGNOSIS — F1021 Alcohol dependence, in remission: Secondary | ICD-10-CM | POA: Diagnosis not present

## 2020-07-09 DIAGNOSIS — G4700 Insomnia, unspecified: Secondary | ICD-10-CM | POA: Diagnosis not present

## 2020-07-09 DIAGNOSIS — J449 Chronic obstructive pulmonary disease, unspecified: Secondary | ICD-10-CM | POA: Diagnosis not present

## 2020-09-11 ENCOUNTER — Ambulatory Visit: Payer: Medicare HMO | Attending: Internal Medicine

## 2020-09-11 DIAGNOSIS — Z23 Encounter for immunization: Secondary | ICD-10-CM

## 2020-09-11 NOTE — Progress Notes (Signed)
   Covid-19 Vaccination Clinic  Name:  AASHRITH EVES    MRN: [id number] DOB: [birth year]  09/11/2020  Mr. Hanken was observed post Covid-19 immunization for 15 minutes without incident. He was provided with Vaccine Information Sheet and instruction to access the V-Safe system.   Mr. Dubuque was instructed to call 911 with any severe reactions post vaccine: Marland Kitchen Difficulty breathing  . Swelling of face and throat  . A fast heartbeat  . A bad rash all over body  . Dizziness and weakness   Immunizations Administered    Name Date Dose VIS Date Route   Moderna Covid-19 Booster Vaccine 09/11/2020  2:16 PM 0.25 mL 07/02/2020 Intramuscular   Manufacturer: Moderna   Lot: [id number]   [place]: 89169-450-38

## 2020-09-22 DIAGNOSIS — Z85828 Personal history of other malignant neoplasm of skin: Secondary | ICD-10-CM | POA: Diagnosis not present

## 2020-09-22 DIAGNOSIS — L821 Other seborrheic keratosis: Secondary | ICD-10-CM | POA: Diagnosis not present

## 2020-09-22 DIAGNOSIS — Z08 Encounter for follow-up examination after completed treatment for malignant neoplasm: Secondary | ICD-10-CM | POA: Diagnosis not present

## 2020-11-06 DIAGNOSIS — Z712 Person consulting for explanation of examination or test findings: Secondary | ICD-10-CM | POA: Diagnosis not present

## 2020-11-06 DIAGNOSIS — Z0001 Encounter for general adult medical examination with abnormal findings: Secondary | ICD-10-CM | POA: Diagnosis not present

## 2020-11-06 DIAGNOSIS — F0390 Unspecified dementia without behavioral disturbance: Secondary | ICD-10-CM | POA: Diagnosis not present

## 2020-11-06 DIAGNOSIS — F17200 Nicotine dependence, unspecified, uncomplicated: Secondary | ICD-10-CM | POA: Diagnosis not present

## 2020-11-06 DIAGNOSIS — Z6839 Body mass index (BMI) 39.0-39.9, adult: Secondary | ICD-10-CM | POA: Diagnosis not present

## 2020-11-06 DIAGNOSIS — Z23 Encounter for immunization: Secondary | ICD-10-CM | POA: Diagnosis not present

## 2020-11-06 DIAGNOSIS — M25561 Pain in right knee: Secondary | ICD-10-CM | POA: Diagnosis not present

## 2020-11-06 DIAGNOSIS — E1122 Type 2 diabetes mellitus with diabetic chronic kidney disease: Secondary | ICD-10-CM | POA: Diagnosis not present

## 2020-11-06 DIAGNOSIS — R801 Persistent proteinuria, unspecified: Secondary | ICD-10-CM | POA: Diagnosis not present

## 2020-11-10 DIAGNOSIS — N182 Chronic kidney disease, stage 2 (mild): Secondary | ICD-10-CM | POA: Diagnosis not present

## 2020-11-10 DIAGNOSIS — F17210 Nicotine dependence, cigarettes, uncomplicated: Secondary | ICD-10-CM | POA: Diagnosis not present

## 2020-11-10 DIAGNOSIS — E1122 Type 2 diabetes mellitus with diabetic chronic kidney disease: Secondary | ICD-10-CM | POA: Diagnosis not present

## 2020-11-10 DIAGNOSIS — I5032 Chronic diastolic (congestive) heart failure: Secondary | ICD-10-CM | POA: Diagnosis not present

## 2020-11-10 DIAGNOSIS — G4700 Insomnia, unspecified: Secondary | ICD-10-CM | POA: Diagnosis not present

## 2020-11-10 DIAGNOSIS — F1021 Alcohol dependence, in remission: Secondary | ICD-10-CM | POA: Diagnosis not present

## 2020-11-10 DIAGNOSIS — E782 Mixed hyperlipidemia: Secondary | ICD-10-CM | POA: Diagnosis not present

## 2020-11-10 DIAGNOSIS — J449 Chronic obstructive pulmonary disease, unspecified: Secondary | ICD-10-CM | POA: Diagnosis not present

## 2020-11-10 DIAGNOSIS — E512 Wernicke's encephalopathy: Secondary | ICD-10-CM | POA: Diagnosis not present

## 2020-11-11 DIAGNOSIS — G4700 Insomnia, unspecified: Secondary | ICD-10-CM | POA: Diagnosis not present

## 2020-11-11 DIAGNOSIS — I5032 Chronic diastolic (congestive) heart failure: Secondary | ICD-10-CM | POA: Diagnosis not present

## 2020-11-11 DIAGNOSIS — F1021 Alcohol dependence, in remission: Secondary | ICD-10-CM | POA: Diagnosis not present

## 2020-11-11 DIAGNOSIS — E782 Mixed hyperlipidemia: Secondary | ICD-10-CM | POA: Diagnosis not present

## 2020-11-11 DIAGNOSIS — J449 Chronic obstructive pulmonary disease, unspecified: Secondary | ICD-10-CM | POA: Diagnosis not present

## 2020-11-11 DIAGNOSIS — E1122 Type 2 diabetes mellitus with diabetic chronic kidney disease: Secondary | ICD-10-CM | POA: Diagnosis not present

## 2020-11-11 DIAGNOSIS — E512 Wernicke's encephalopathy: Secondary | ICD-10-CM | POA: Diagnosis not present

## 2020-11-11 DIAGNOSIS — N182 Chronic kidney disease, stage 2 (mild): Secondary | ICD-10-CM | POA: Diagnosis not present

## 2020-12-10 DIAGNOSIS — F1021 Alcohol dependence, in remission: Secondary | ICD-10-CM | POA: Diagnosis not present

## 2020-12-10 DIAGNOSIS — R945 Abnormal results of liver function studies: Secondary | ICD-10-CM | POA: Diagnosis not present

## 2020-12-10 DIAGNOSIS — E782 Mixed hyperlipidemia: Secondary | ICD-10-CM | POA: Diagnosis not present

## 2020-12-10 DIAGNOSIS — I5032 Chronic diastolic (congestive) heart failure: Secondary | ICD-10-CM | POA: Diagnosis not present

## 2020-12-10 DIAGNOSIS — E1122 Type 2 diabetes mellitus with diabetic chronic kidney disease: Secondary | ICD-10-CM | POA: Diagnosis not present

## 2020-12-10 DIAGNOSIS — J449 Chronic obstructive pulmonary disease, unspecified: Secondary | ICD-10-CM | POA: Diagnosis not present

## 2020-12-10 DIAGNOSIS — E512 Wernicke's encephalopathy: Secondary | ICD-10-CM | POA: Diagnosis not present

## 2020-12-10 DIAGNOSIS — G4700 Insomnia, unspecified: Secondary | ICD-10-CM | POA: Diagnosis not present

## 2021-01-12 DIAGNOSIS — E119 Type 2 diabetes mellitus without complications: Secondary | ICD-10-CM | POA: Diagnosis not present

## 2021-03-12 DIAGNOSIS — E1165 Type 2 diabetes mellitus with hyperglycemia: Secondary | ICD-10-CM | POA: Diagnosis not present

## 2021-03-12 DIAGNOSIS — I10 Essential (primary) hypertension: Secondary | ICD-10-CM | POA: Diagnosis not present

## 2021-03-19 DIAGNOSIS — E1122 Type 2 diabetes mellitus with diabetic chronic kidney disease: Secondary | ICD-10-CM | POA: Diagnosis not present

## 2021-03-19 DIAGNOSIS — E782 Mixed hyperlipidemia: Secondary | ICD-10-CM | POA: Diagnosis not present

## 2021-03-23 DIAGNOSIS — D224 Melanocytic nevi of scalp and neck: Secondary | ICD-10-CM | POA: Diagnosis not present

## 2021-03-23 DIAGNOSIS — D225 Melanocytic nevi of trunk: Secondary | ICD-10-CM | POA: Diagnosis not present

## 2021-03-23 DIAGNOSIS — Z08 Encounter for follow-up examination after completed treatment for malignant neoplasm: Secondary | ICD-10-CM | POA: Diagnosis not present

## 2021-03-23 DIAGNOSIS — X32XXXD Exposure to sunlight, subsequent encounter: Secondary | ICD-10-CM | POA: Diagnosis not present

## 2021-03-23 DIAGNOSIS — L570 Actinic keratosis: Secondary | ICD-10-CM | POA: Diagnosis not present

## 2021-03-23 DIAGNOSIS — Z85828 Personal history of other malignant neoplasm of skin: Secondary | ICD-10-CM | POA: Diagnosis not present

## 2021-03-24 DIAGNOSIS — D638 Anemia in other chronic diseases classified elsewhere: Secondary | ICD-10-CM | POA: Diagnosis not present

## 2021-03-24 DIAGNOSIS — J449 Chronic obstructive pulmonary disease, unspecified: Secondary | ICD-10-CM | POA: Diagnosis not present

## 2021-03-24 DIAGNOSIS — F0390 Unspecified dementia without behavioral disturbance: Secondary | ICD-10-CM | POA: Diagnosis not present

## 2021-03-24 DIAGNOSIS — R609 Edema, unspecified: Secondary | ICD-10-CM | POA: Diagnosis not present

## 2021-03-24 DIAGNOSIS — E782 Mixed hyperlipidemia: Secondary | ICD-10-CM | POA: Diagnosis not present

## 2021-03-24 DIAGNOSIS — N182 Chronic kidney disease, stage 2 (mild): Secondary | ICD-10-CM | POA: Diagnosis not present

## 2021-03-24 DIAGNOSIS — E1169 Type 2 diabetes mellitus with other specified complication: Secondary | ICD-10-CM | POA: Diagnosis not present

## 2021-03-24 DIAGNOSIS — R801 Persistent proteinuria, unspecified: Secondary | ICD-10-CM | POA: Diagnosis not present

## 2021-03-24 DIAGNOSIS — J45909 Unspecified asthma, uncomplicated: Secondary | ICD-10-CM | POA: Diagnosis not present

## 2021-04-12 DIAGNOSIS — I10 Essential (primary) hypertension: Secondary | ICD-10-CM | POA: Diagnosis not present

## 2021-04-12 DIAGNOSIS — E1165 Type 2 diabetes mellitus with hyperglycemia: Secondary | ICD-10-CM | POA: Diagnosis not present

## 2021-06-12 DIAGNOSIS — E1165 Type 2 diabetes mellitus with hyperglycemia: Secondary | ICD-10-CM | POA: Diagnosis not present

## 2021-06-12 DIAGNOSIS — I10 Essential (primary) hypertension: Secondary | ICD-10-CM | POA: Diagnosis not present

## 2021-07-13 ENCOUNTER — Other Ambulatory Visit: Payer: Self-pay

## 2021-07-13 ENCOUNTER — Ambulatory Visit: Payer: Medicare HMO | Attending: Internal Medicine

## 2021-07-13 ENCOUNTER — Other Ambulatory Visit (HOSPITAL_BASED_OUTPATIENT_CLINIC_OR_DEPARTMENT_OTHER): Payer: Self-pay

## 2021-07-13 DIAGNOSIS — Z23 Encounter for immunization: Secondary | ICD-10-CM

## 2021-07-13 MED ORDER — MODERNA COVID-19 BIVAL BOOSTER 50 MCG/0.5ML IM SUSP
INTRAMUSCULAR | 0 refills | Status: DC
  Filled 2021-07-13: qty 0.5, 1d supply, fill #0

## 2021-07-13 NOTE — Progress Notes (Signed)
   Covid-19 Vaccination Clinic  Name:  Jeff Miles    MRN: [id number] DOB: 1950-11-27  07/13/2021  Mr. [name] was observed post Covid-19 immunization for 15 minutes without incident. He was provided with Vaccine Information Sheet and instruction to access the V-Safe system.   Mr. [name] was instructed to call 911 with any severe reactions post vaccine: Difficulty breathing  Swelling of face and throat  A fast heartbeat  A bad rash all over body  Dizziness and weakness   Immunizations Administered     Name Date Dose VIS Date Route   Moderna Covid-19 vaccine Bivalent Booster 07/13/2021  1:50 PM 0.5 mL 04/25/2021 Intramuscular   Manufacturer: Moderna   Lot: [id number]   [place]: [id number]

## 2021-07-23 DIAGNOSIS — E782 Mixed hyperlipidemia: Secondary | ICD-10-CM | POA: Diagnosis not present

## 2021-07-23 DIAGNOSIS — E1169 Type 2 diabetes mellitus with other specified complication: Secondary | ICD-10-CM | POA: Diagnosis not present

## 2021-07-28 DIAGNOSIS — F0390 Unspecified dementia without behavioral disturbance: Secondary | ICD-10-CM | POA: Diagnosis not present

## 2021-07-28 DIAGNOSIS — D638 Anemia in other chronic diseases classified elsewhere: Secondary | ICD-10-CM | POA: Diagnosis not present

## 2021-07-28 DIAGNOSIS — R609 Edema, unspecified: Secondary | ICD-10-CM | POA: Diagnosis not present

## 2021-07-28 DIAGNOSIS — R801 Persistent proteinuria, unspecified: Secondary | ICD-10-CM | POA: Diagnosis not present

## 2021-07-28 DIAGNOSIS — Z0001 Encounter for general adult medical examination with abnormal findings: Secondary | ICD-10-CM | POA: Diagnosis not present

## 2021-07-28 DIAGNOSIS — E1169 Type 2 diabetes mellitus with other specified complication: Secondary | ICD-10-CM | POA: Diagnosis not present

## 2021-07-28 DIAGNOSIS — J45909 Unspecified asthma, uncomplicated: Secondary | ICD-10-CM | POA: Diagnosis not present

## 2021-07-28 DIAGNOSIS — N182 Chronic kidney disease, stage 2 (mild): Secondary | ICD-10-CM | POA: Diagnosis not present

## 2021-07-28 DIAGNOSIS — Z23 Encounter for immunization: Secondary | ICD-10-CM | POA: Diagnosis not present

## 2021-08-12 DIAGNOSIS — I10 Essential (primary) hypertension: Secondary | ICD-10-CM | POA: Diagnosis not present

## 2021-08-12 DIAGNOSIS — E782 Mixed hyperlipidemia: Secondary | ICD-10-CM | POA: Diagnosis not present

## 2021-09-08 DIAGNOSIS — D509 Iron deficiency anemia, unspecified: Secondary | ICD-10-CM | POA: Diagnosis not present

## 2021-09-11 DIAGNOSIS — I10 Essential (primary) hypertension: Secondary | ICD-10-CM | POA: Diagnosis not present

## 2021-09-11 DIAGNOSIS — E782 Mixed hyperlipidemia: Secondary | ICD-10-CM | POA: Diagnosis not present

## 2021-09-24 ENCOUNTER — Encounter (HOSPITAL_COMMUNITY)
Admission: RE | Admit: 2021-09-24 | Discharge: 2021-09-24 | Disposition: A | Discharge status: Home / Self Care | Payer: Medicare HMO | Source: Ambulatory Visit | Attending: Internal Medicine | Admitting: Internal Medicine

## 2021-09-24 DIAGNOSIS — D509 Iron deficiency anemia, unspecified: Secondary | ICD-10-CM | POA: Diagnosis not present

## 2021-09-24 MED ORDER — SODIUM CHLORIDE 0.9 % IV SOLN: Freq: Once | INTRAVENOUS | Status: AC

## 2021-09-24 MED ORDER — SODIUM CHLORIDE 0.9 % IV SOLN
510.0000 mg | Freq: Once | INTRAVENOUS | Status: AC
  Administered 2021-09-24: 510 mg via INTRAVENOUS
  Filled 2021-09-24: qty 510

## 2021-10-01 ENCOUNTER — Encounter (HOSPITAL_COMMUNITY)
Admission: RE | Admit: 2021-10-01 | Discharge: 2021-10-01 | Disposition: A | Discharge status: Home / Self Care | Payer: Medicare HMO | Source: Ambulatory Visit | Attending: Internal Medicine | Admitting: Internal Medicine

## 2021-10-01 ENCOUNTER — Encounter (HOSPITAL_COMMUNITY): Payer: Self-pay

## 2021-10-01 ENCOUNTER — Other Ambulatory Visit: Payer: Self-pay

## 2021-10-01 DIAGNOSIS — D509 Iron deficiency anemia, unspecified: Secondary | ICD-10-CM | POA: Diagnosis not present

## 2021-10-01 MED ORDER — SODIUM CHLORIDE 0.9 % IV SOLN
Freq: Once | INTRAVENOUS | Status: AC
  Administered 2021-10-01: 250 mL via INTRAVENOUS

## 2021-10-01 MED ORDER — SODIUM CHLORIDE 0.9 % IV SOLN
510.0000 mg | Freq: Once | INTRAVENOUS | Status: AC
  Administered 2021-10-01: 510 mg via INTRAVENOUS
  Filled 2021-10-01: qty 17

## 2021-10-30 DIAGNOSIS — E782 Mixed hyperlipidemia: Secondary | ICD-10-CM | POA: Diagnosis not present

## 2021-10-30 DIAGNOSIS — D509 Iron deficiency anemia, unspecified: Secondary | ICD-10-CM | POA: Diagnosis not present

## 2021-10-30 DIAGNOSIS — E1165 Type 2 diabetes mellitus with hyperglycemia: Secondary | ICD-10-CM | POA: Diagnosis not present

## 2021-10-30 DIAGNOSIS — E1169 Type 2 diabetes mellitus with other specified complication: Secondary | ICD-10-CM | POA: Diagnosis not present

## 2021-11-19 ENCOUNTER — Encounter: Payer: Self-pay | Admitting: *Deleted

## 2021-11-25 DIAGNOSIS — J449 Chronic obstructive pulmonary disease, unspecified: Secondary | ICD-10-CM | POA: Diagnosis not present

## 2021-11-25 DIAGNOSIS — R609 Edema, unspecified: Secondary | ICD-10-CM | POA: Diagnosis not present

## 2021-11-25 DIAGNOSIS — E782 Mixed hyperlipidemia: Secondary | ICD-10-CM | POA: Diagnosis not present

## 2021-11-25 DIAGNOSIS — I5032 Chronic diastolic (congestive) heart failure: Secondary | ICD-10-CM | POA: Diagnosis not present

## 2021-11-25 DIAGNOSIS — J45909 Unspecified asthma, uncomplicated: Secondary | ICD-10-CM | POA: Diagnosis not present

## 2021-11-25 DIAGNOSIS — R801 Persistent proteinuria, unspecified: Secondary | ICD-10-CM | POA: Diagnosis not present

## 2021-11-25 DIAGNOSIS — F0390 Unspecified dementia without behavioral disturbance: Secondary | ICD-10-CM | POA: Diagnosis not present

## 2021-11-25 DIAGNOSIS — E1169 Type 2 diabetes mellitus with other specified complication: Secondary | ICD-10-CM | POA: Diagnosis not present

## 2021-11-25 DIAGNOSIS — N182 Chronic kidney disease, stage 2 (mild): Secondary | ICD-10-CM | POA: Diagnosis not present

## 2022-01-14 DIAGNOSIS — E119 Type 2 diabetes mellitus without complications: Secondary | ICD-10-CM | POA: Diagnosis not present

## 2022-02-10 DIAGNOSIS — J449 Chronic obstructive pulmonary disease, unspecified: Secondary | ICD-10-CM | POA: Diagnosis not present

## 2022-02-10 DIAGNOSIS — N182 Chronic kidney disease, stage 2 (mild): Secondary | ICD-10-CM | POA: Diagnosis not present

## 2022-02-10 DIAGNOSIS — E782 Mixed hyperlipidemia: Secondary | ICD-10-CM | POA: Diagnosis not present

## 2022-02-10 DIAGNOSIS — I5032 Chronic diastolic (congestive) heart failure: Secondary | ICD-10-CM | POA: Diagnosis not present

## 2022-03-30 DIAGNOSIS — D509 Iron deficiency anemia, unspecified: Secondary | ICD-10-CM | POA: Diagnosis not present

## 2022-03-30 DIAGNOSIS — E782 Mixed hyperlipidemia: Secondary | ICD-10-CM | POA: Diagnosis not present

## 2022-03-30 DIAGNOSIS — E1169 Type 2 diabetes mellitus with other specified complication: Secondary | ICD-10-CM | POA: Diagnosis not present

## 2022-04-06 DIAGNOSIS — J449 Chronic obstructive pulmonary disease, unspecified: Secondary | ICD-10-CM | POA: Diagnosis not present

## 2022-04-06 DIAGNOSIS — R801 Persistent proteinuria, unspecified: Secondary | ICD-10-CM | POA: Diagnosis not present

## 2022-04-06 DIAGNOSIS — R609 Edema, unspecified: Secondary | ICD-10-CM | POA: Diagnosis not present

## 2022-04-06 DIAGNOSIS — J45909 Unspecified asthma, uncomplicated: Secondary | ICD-10-CM | POA: Diagnosis not present

## 2022-04-06 DIAGNOSIS — E782 Mixed hyperlipidemia: Secondary | ICD-10-CM | POA: Diagnosis not present

## 2022-04-06 DIAGNOSIS — F0390 Unspecified dementia without behavioral disturbance: Secondary | ICD-10-CM | POA: Diagnosis not present

## 2022-04-06 DIAGNOSIS — E1169 Type 2 diabetes mellitus with other specified complication: Secondary | ICD-10-CM | POA: Diagnosis not present

## 2022-04-06 DIAGNOSIS — N182 Chronic kidney disease, stage 2 (mild): Secondary | ICD-10-CM | POA: Diagnosis not present

## 2022-04-06 DIAGNOSIS — I5032 Chronic diastolic (congestive) heart failure: Secondary | ICD-10-CM | POA: Diagnosis not present

## 2022-04-07 ENCOUNTER — Other Ambulatory Visit: Payer: Self-pay

## 2022-04-07 DIAGNOSIS — E611 Iron deficiency: Secondary | ICD-10-CM

## 2022-04-08 ENCOUNTER — Telehealth: Payer: Self-pay | Admitting: Pharmacy Technician

## 2022-04-08 NOTE — Telephone Encounter (Addendum)
Auth Submission: no auth needed Payer: [employer] MEDICARE Medication & CPT/J Code(s) submitted: venofer Route of submission (phone, fax, portal): PHONE: Auth type: Buy/Bill Units/visits requested: venofer '200mg'$  wkly x 3 wks. Reference number: Approval from: 04/09/22  to  07/10/22

## 2022-04-09 ENCOUNTER — Other Ambulatory Visit: Payer: Self-pay | Admitting: Pharmacy Technician

## 2022-04-09 ENCOUNTER — Encounter: Payer: Self-pay | Admitting: Pulmonary Disease

## 2022-04-09 DIAGNOSIS — D509 Iron deficiency anemia, unspecified: Secondary | ICD-10-CM

## 2022-04-12 ENCOUNTER — Other Ambulatory Visit: Payer: Self-pay

## 2022-04-15 ENCOUNTER — Ambulatory Visit (INDEPENDENT_AMBULATORY_CARE_PROVIDER_SITE_OTHER): Payer: Medicare HMO

## 2022-04-15 VITALS — BP 133/80 | HR 66 | Temp 98.5°F | Resp 18 | Ht 69.0 in | Wt 236.2 lb

## 2022-04-15 DIAGNOSIS — D509 Iron deficiency anemia, unspecified: Secondary | ICD-10-CM | POA: Diagnosis not present

## 2022-04-15 MED ORDER — SODIUM CHLORIDE 0.9 % IV SOLN
200.0000 mg | Freq: Once | INTRAVENOUS | Status: AC
  Administered 2022-04-15: 200 mg via INTRAVENOUS
  Filled 2022-04-15: qty 10

## 2022-04-15 NOTE — Progress Notes (Signed)
Diagnosis: Iron Deficiency Anemia  Provider:  Marshell Garfinkel, MD  Procedure: Infusion  IV Type: Peripheral, IV Location: R Forearm  Venofer (Iron Sucrose), Dose: 200 mg  Infusion Start Time: 3159  Infusion Stop Time: 1110  Post Infusion IV Care: Observation period completed and Peripheral IV Discontinued  Discharge: Condition: Good, Destination: Home . AVS provided to patient.   Performed by:  Adelina Mings, LPN

## 2022-04-22 ENCOUNTER — Ambulatory Visit (INDEPENDENT_AMBULATORY_CARE_PROVIDER_SITE_OTHER): Payer: Medicare HMO

## 2022-04-22 VITALS — BP 115/78 | HR 70 | Temp 97.8°F | Resp 18 | Ht 69.0 in | Wt 233.8 lb

## 2022-04-22 DIAGNOSIS — D509 Iron deficiency anemia, unspecified: Secondary | ICD-10-CM | POA: Diagnosis not present

## 2022-04-22 MED ORDER — SODIUM CHLORIDE 0.9 % IV SOLN
200.0000 mg | Freq: Once | INTRAVENOUS | Status: AC
  Administered 2022-04-22: 200 mg via INTRAVENOUS
  Filled 2022-04-22: qty 10

## 2022-04-22 NOTE — Progress Notes (Signed)
Diagnosis: Iron Deficiency Anemia  Provider:  Marshell Garfinkel, MD  Procedure: Infusion  IV Type: Peripheral, IV Location: L Hand  Venofer (Iron Sucrose), Dose: 200 mg  Infusion Start Time: 1643  Infusion Stop Time: 5391  Post Infusion IV Care: Peripheral IV Discontinued  Discharge: Condition: Good, Destination: Home . AVS provided to patient.   Performed by:  Koren Shiver, RN

## 2022-04-29 ENCOUNTER — Ambulatory Visit (INDEPENDENT_AMBULATORY_CARE_PROVIDER_SITE_OTHER): Payer: Medicare HMO

## 2022-04-29 VITALS — BP 146/83 | HR 69 | Temp 98.7°F | Resp 18 | Ht 69.0 in | Wt 232.6 lb

## 2022-04-29 DIAGNOSIS — D509 Iron deficiency anemia, unspecified: Secondary | ICD-10-CM

## 2022-04-29 MED ORDER — SODIUM CHLORIDE 0.9 % IV SOLN
200.0000 mg | Freq: Once | INTRAVENOUS | Status: AC
  Administered 2022-04-29: 200 mg via INTRAVENOUS
  Filled 2022-04-29: qty 10

## 2022-04-29 NOTE — Progress Notes (Signed)
Diagnosis: Iron Deficiency Anemia  Provider:  Marshell Garfinkel MD  Procedure: Infusion  IV Type: Peripheral, IV Location: L Hand  Venofer (Iron Sucrose), Dose: 200 mg  Infusion Start Time: 9528  Infusion Stop Time: 4132  Post Infusion IV Care: Peripheral IV Discontinued  Discharge: Condition: Good, Destination: Home . AVS provided to patient.   Performed by:  Adelina Mings, LPN

## 2022-07-14 DIAGNOSIS — E1169 Type 2 diabetes mellitus with other specified complication: Secondary | ICD-10-CM | POA: Diagnosis not present

## 2022-07-14 DIAGNOSIS — E782 Mixed hyperlipidemia: Secondary | ICD-10-CM | POA: Diagnosis not present

## 2022-07-14 DIAGNOSIS — D509 Iron deficiency anemia, unspecified: Secondary | ICD-10-CM | POA: Diagnosis not present

## 2022-07-20 DIAGNOSIS — R609 Edema, unspecified: Secondary | ICD-10-CM | POA: Diagnosis not present

## 2022-07-20 DIAGNOSIS — F0390 Unspecified dementia without behavioral disturbance: Secondary | ICD-10-CM | POA: Diagnosis not present

## 2022-07-20 DIAGNOSIS — I5032 Chronic diastolic (congestive) heart failure: Secondary | ICD-10-CM | POA: Diagnosis not present

## 2022-07-20 DIAGNOSIS — N182 Chronic kidney disease, stage 2 (mild): Secondary | ICD-10-CM | POA: Diagnosis not present

## 2022-07-20 DIAGNOSIS — E1122 Type 2 diabetes mellitus with diabetic chronic kidney disease: Secondary | ICD-10-CM | POA: Diagnosis not present

## 2022-07-20 DIAGNOSIS — J449 Chronic obstructive pulmonary disease, unspecified: Secondary | ICD-10-CM | POA: Diagnosis not present

## 2022-07-20 DIAGNOSIS — R801 Persistent proteinuria, unspecified: Secondary | ICD-10-CM | POA: Diagnosis not present

## 2022-07-20 DIAGNOSIS — E782 Mixed hyperlipidemia: Secondary | ICD-10-CM | POA: Diagnosis not present

## 2022-07-20 DIAGNOSIS — J45909 Unspecified asthma, uncomplicated: Secondary | ICD-10-CM | POA: Diagnosis not present

## 2022-11-19 DIAGNOSIS — E782 Mixed hyperlipidemia: Secondary | ICD-10-CM | POA: Diagnosis not present

## 2022-11-19 DIAGNOSIS — D509 Iron deficiency anemia, unspecified: Secondary | ICD-10-CM | POA: Diagnosis not present

## 2022-11-19 DIAGNOSIS — E1122 Type 2 diabetes mellitus with diabetic chronic kidney disease: Secondary | ICD-10-CM | POA: Diagnosis not present

## 2022-11-25 DIAGNOSIS — F0390 Unspecified dementia without behavioral disturbance: Secondary | ICD-10-CM | POA: Diagnosis not present

## 2022-11-25 DIAGNOSIS — Z0001 Encounter for general adult medical examination with abnormal findings: Secondary | ICD-10-CM | POA: Diagnosis not present

## 2022-11-25 DIAGNOSIS — R801 Persistent proteinuria, unspecified: Secondary | ICD-10-CM | POA: Diagnosis not present

## 2022-11-25 DIAGNOSIS — F1021 Alcohol dependence, in remission: Secondary | ICD-10-CM | POA: Diagnosis not present

## 2022-11-25 DIAGNOSIS — E1122 Type 2 diabetes mellitus with diabetic chronic kidney disease: Secondary | ICD-10-CM | POA: Diagnosis not present

## 2022-11-25 DIAGNOSIS — R609 Edema, unspecified: Secondary | ICD-10-CM | POA: Diagnosis not present

## 2022-11-25 DIAGNOSIS — J45909 Unspecified asthma, uncomplicated: Secondary | ICD-10-CM | POA: Diagnosis not present

## 2022-11-25 DIAGNOSIS — M25461 Effusion, right knee: Secondary | ICD-10-CM | POA: Diagnosis not present

## 2022-11-25 DIAGNOSIS — J449 Chronic obstructive pulmonary disease, unspecified: Secondary | ICD-10-CM | POA: Diagnosis not present

## 2022-11-25 DIAGNOSIS — N182 Chronic kidney disease, stage 2 (mild): Secondary | ICD-10-CM | POA: Diagnosis not present

## 2023-03-22 DIAGNOSIS — E1122 Type 2 diabetes mellitus with diabetic chronic kidney disease: Secondary | ICD-10-CM | POA: Diagnosis not present

## 2023-03-22 DIAGNOSIS — D509 Iron deficiency anemia, unspecified: Secondary | ICD-10-CM | POA: Diagnosis not present

## 2023-03-22 DIAGNOSIS — E782 Mixed hyperlipidemia: Secondary | ICD-10-CM | POA: Diagnosis not present

## 2023-03-22 DIAGNOSIS — Z125 Encounter for screening for malignant neoplasm of prostate: Secondary | ICD-10-CM | POA: Diagnosis not present

## 2023-03-28 ENCOUNTER — Other Ambulatory Visit: Payer: Self-pay

## 2023-03-28 DIAGNOSIS — J45909 Unspecified asthma, uncomplicated: Secondary | ICD-10-CM | POA: Diagnosis not present

## 2023-03-28 DIAGNOSIS — R609 Edema, unspecified: Secondary | ICD-10-CM | POA: Diagnosis not present

## 2023-03-28 DIAGNOSIS — E782 Mixed hyperlipidemia: Secondary | ICD-10-CM | POA: Diagnosis not present

## 2023-03-28 DIAGNOSIS — F0394 Unspecified dementia, unspecified severity, with anxiety: Secondary | ICD-10-CM | POA: Diagnosis not present

## 2023-03-28 DIAGNOSIS — N182 Chronic kidney disease, stage 2 (mild): Secondary | ICD-10-CM | POA: Diagnosis not present

## 2023-03-28 DIAGNOSIS — I5032 Chronic diastolic (congestive) heart failure: Secondary | ICD-10-CM | POA: Diagnosis not present

## 2023-03-28 DIAGNOSIS — R801 Persistent proteinuria, unspecified: Secondary | ICD-10-CM | POA: Diagnosis not present

## 2023-03-28 DIAGNOSIS — J4489 Other specified chronic obstructive pulmonary disease: Secondary | ICD-10-CM | POA: Diagnosis not present

## 2023-03-28 DIAGNOSIS — E1122 Type 2 diabetes mellitus with diabetic chronic kidney disease: Secondary | ICD-10-CM | POA: Diagnosis not present

## 2023-04-01 ENCOUNTER — Telehealth: Payer: Self-pay | Admitting: Pharmacy Technician

## 2023-04-01 NOTE — Telephone Encounter (Addendum)
Auth Submission: UPDATED - PENDING NEED AUTH Site of care: Site of care: [facility] Payer: [employer] MEDICARE Medication & CPT/J Code(s) submitted: Feraheme (ferumoxytol) [id number] Route of submission (phone, fax, portal): PORTAL Phone # Fax # Auth type: Buy/Bill Units/visits requested: X2 Reference number: Reference Number [id number] Transaction ID: [id number] Customer ID: [id number]  Approval from:

## 2023-04-07 ENCOUNTER — Encounter (HOSPITAL_COMMUNITY)
Admission: RE | Admit: 2023-04-07 | Discharge: 2023-04-07 | Disposition: A | Discharge status: Home / Self Care | Payer: Medicare HMO | Source: Ambulatory Visit | Attending: Internal Medicine | Admitting: Internal Medicine

## 2023-04-07 VITALS — BP 132/59 | HR 72 | Temp 98.5°F | Resp 18

## 2023-04-07 DIAGNOSIS — E611 Iron deficiency: Secondary | ICD-10-CM

## 2023-04-07 DIAGNOSIS — D509 Iron deficiency anemia, unspecified: Secondary | ICD-10-CM | POA: Diagnosis not present

## 2023-04-07 DIAGNOSIS — H5203 Hypermetropia, bilateral: Secondary | ICD-10-CM | POA: Diagnosis not present

## 2023-04-07 MED ORDER — DIPHENHYDRAMINE HCL 25 MG PO CAPS
25.0000 mg | ORAL_CAPSULE | Freq: Once | ORAL | Status: AC
  Administered 2023-04-07: 25 mg via ORAL

## 2023-04-07 MED ORDER — ACETAMINOPHEN 325 MG PO TABS
650.0000 mg | ORAL_TABLET | Freq: Once | ORAL | Status: AC
  Administered 2023-04-07: 650 mg via ORAL

## 2023-04-07 MED ORDER — SODIUM CHLORIDE 0.9 % IV SOLN
510.0000 mg | Freq: Once | INTRAVENOUS | Status: AC
  Administered 2023-04-07: 510 mg via INTRAVENOUS
  Filled 2023-04-07: qty 510

## 2023-04-07 NOTE — Progress Notes (Signed)
Diagnosis: Iron Deficiency Anemia  Provider:  Dwana Melena MD  Procedure: IV Infusion  IV Type: Peripheral, IV Location: R Forearm  Feraheme (Ferumoxytol), Dose: 510 mg  Infusion Start Time: 1400  Infusion Stop Time: 1415  Post Infusion IV Care: Observation period completed  Discharge: Condition: Good, Destination: Home . AVS Provided  Performed by:  Daleen Squibb, RN

## 2023-04-14 ENCOUNTER — Encounter (HOSPITAL_COMMUNITY): Admission: RE | Admit: 2023-04-14 | Payer: Medicare HMO | Source: Ambulatory Visit

## 2023-04-14 ENCOUNTER — Encounter (HOSPITAL_COMMUNITY): Payer: Medicare HMO | Attending: Internal Medicine

## 2023-04-14 VITALS — BP 150/66 | HR 65 | Temp 98.4°F | Resp 18

## 2023-04-14 DIAGNOSIS — E611 Iron deficiency: Secondary | ICD-10-CM

## 2023-04-14 MED ORDER — DIPHENHYDRAMINE HCL 25 MG PO CAPS: 25.0000 mg | ORAL_CAPSULE | Freq: Once | ORAL | Status: AC

## 2023-04-14 MED ORDER — DIPHENHYDRAMINE HCL 25 MG PO CAPS
25.0000 mg | ORAL_CAPSULE | Freq: Once | ORAL | Status: AC
  Administered 2023-04-14: 25 mg via ORAL

## 2023-04-14 MED ORDER — ACETAMINOPHEN 325 MG PO TABS
650.0000 mg | ORAL_TABLET | Freq: Once | ORAL | Status: AC
  Administered 2023-04-14: 650 mg via ORAL

## 2023-04-14 MED ORDER — SODIUM CHLORIDE 0.9 % IV SOLN
510.0000 mg | Freq: Once | INTRAVENOUS | Status: AC
  Administered 2023-04-14: 510 mg via INTRAVENOUS
  Filled 2023-04-14: qty 17

## 2023-04-14 MED ORDER — ACETAMINOPHEN 325 MG PO TABS: 650.0000 mg | ORAL_TABLET | Freq: Once | ORAL | Status: AC

## 2023-04-14 NOTE — Progress Notes (Signed)
Diagnosis: Iron Deficiency Anemia  Provider:  Dwana Melena MD  Procedure: IV Infusion  IV Type: Peripheral, IV Location: R Forearm  Feraheme (Ferumoxytol), Dose: 510 mg  Infusion Start Time: 1348  Infusion Stop Time: 1403  Post Infusion IV Care: Observation period completed  Discharge: Condition: Good, Destination: Home . AVS Declined  Performed by:  Daleen Squibb, RN

## 2023-04-15 ENCOUNTER — Encounter: Payer: Self-pay | Admitting: Internal Medicine

## 2023-04-18 NOTE — Progress Notes (Signed)
Diagnosis: Iron Deficiency Anemia  Provider:  Dwana Melena MD  Procedure: IV Infusion  IV Type: Peripheral, IV Location: R Forearm  Feraheme (Ferumoxytol), Dose: 510 mg  Infusion Start Time: 1348  Infusion Stop Time: 1403  Post Infusion IV Care: Observation period completed  Discharge: Condition: Good, Destination: Home . AVS Declined  Performed by:  Marin Shutter, RN

## 2023-06-26 ENCOUNTER — Observation Stay (HOSPITAL_COMMUNITY)
Admission: EM | Admit: 2023-06-26 | Discharge: 2023-06-29 | Disposition: A | Discharge status: Home / Self Care | Payer: Medicare HMO | Attending: Family Medicine | Admitting: Family Medicine

## 2023-06-26 ENCOUNTER — Encounter (HOSPITAL_COMMUNITY): Payer: Self-pay

## 2023-06-26 ENCOUNTER — Other Ambulatory Visit: Payer: Self-pay

## 2023-06-26 DIAGNOSIS — Z23 Encounter for immunization: Secondary | ICD-10-CM | POA: Diagnosis not present

## 2023-06-26 DIAGNOSIS — I509 Heart failure, unspecified: Secondary | ICD-10-CM | POA: Diagnosis not present

## 2023-06-26 DIAGNOSIS — K5731 Diverticulosis of large intestine without perforation or abscess with bleeding: Principal | ICD-10-CM | POA: Insufficient documentation

## 2023-06-26 DIAGNOSIS — Z7984 Long term (current) use of oral hypoglycemic drugs: Secondary | ICD-10-CM | POA: Diagnosis not present

## 2023-06-26 DIAGNOSIS — K921 Melena: Secondary | ICD-10-CM | POA: Insufficient documentation

## 2023-06-26 DIAGNOSIS — F419 Anxiety disorder, unspecified: Secondary | ICD-10-CM | POA: Diagnosis not present

## 2023-06-26 DIAGNOSIS — K317 Polyp of stomach and duodenum: Secondary | ICD-10-CM | POA: Diagnosis not present

## 2023-06-26 DIAGNOSIS — K648 Other hemorrhoids: Secondary | ICD-10-CM | POA: Insufficient documentation

## 2023-06-26 DIAGNOSIS — Z79899 Other long term (current) drug therapy: Secondary | ICD-10-CM | POA: Diagnosis not present

## 2023-06-26 DIAGNOSIS — K5730 Diverticulosis of large intestine without perforation or abscess without bleeding: Secondary | ICD-10-CM | POA: Diagnosis not present

## 2023-06-26 DIAGNOSIS — E119 Type 2 diabetes mellitus without complications: Secondary | ICD-10-CM

## 2023-06-26 DIAGNOSIS — J449 Chronic obstructive pulmonary disease, unspecified: Secondary | ICD-10-CM | POA: Insufficient documentation

## 2023-06-26 DIAGNOSIS — Z87891 Personal history of nicotine dependence: Secondary | ICD-10-CM | POA: Diagnosis not present

## 2023-06-26 DIAGNOSIS — K219 Gastro-esophageal reflux disease without esophagitis: Secondary | ICD-10-CM | POA: Diagnosis not present

## 2023-06-26 DIAGNOSIS — D62 Acute posthemorrhagic anemia: Secondary | ICD-10-CM | POA: Insufficient documentation

## 2023-06-26 DIAGNOSIS — I110 Hypertensive heart disease with heart failure: Secondary | ICD-10-CM | POA: Diagnosis not present

## 2023-06-26 DIAGNOSIS — F0390 Unspecified dementia without behavioral disturbance: Secondary | ICD-10-CM | POA: Insufficient documentation

## 2023-06-26 DIAGNOSIS — K2970 Gastritis, unspecified, without bleeding: Secondary | ICD-10-CM | POA: Insufficient documentation

## 2023-06-26 DIAGNOSIS — K922 Gastrointestinal hemorrhage, unspecified: Secondary | ICD-10-CM | POA: Diagnosis not present

## 2023-06-26 DIAGNOSIS — K625 Hemorrhage of anus and rectum: Principal | ICD-10-CM

## 2023-06-26 DIAGNOSIS — K641 Second degree hemorrhoids: Secondary | ICD-10-CM

## 2023-06-26 DIAGNOSIS — R000 Tachycardia, unspecified: Secondary | ICD-10-CM | POA: Diagnosis not present

## 2023-06-26 LAB — RETICULOCYTES
Immature Retic Fract: 19.2 % — ABNORMAL HIGH (ref 2.3–15.9)
RBC.: 3.9 MIL/uL — ABNORMAL LOW (ref 4.22–5.81)
Retic Count, Absolute: 71.4 10*3/uL (ref 19.0–186.0)
Retic Ct Pct: 1.8 % (ref 0.4–3.1)

## 2023-06-26 LAB — COMPREHENSIVE METABOLIC PANEL
ALT: 20 U/L (ref 0–44)
AST: 29 U/L (ref 15–41)
Albumin: 4 g/dL (ref 3.5–5.0)
Alkaline Phosphatase: 81 U/L (ref 38–126)
Anion gap: 12 (ref 5–15)
BUN: 12 mg/dL (ref 8–23)
CO2: 26 mmol/L (ref 22–32)
Calcium: 9.6 mg/dL (ref 8.9–10.3)
Chloride: 101 mmol/L (ref 98–111)
Creatinine, Ser: 1.19 mg/dL (ref 0.61–1.24)
GFR, Estimated: >60 mL/min (ref >60)
Glucose, Bld: 162 mg/dL — ABNORMAL HIGH (ref 70–99)
Potassium: 4 mmol/L (ref 3.5–5.1)
Sodium: 139 mmol/L (ref 135–145)
Total Bilirubin: 0.5 mg/dL (ref 0.3–1.2)
Total Protein: 7.5 g/dL (ref 6.5–8.1)

## 2023-06-26 LAB — CBC WITH DIFFERENTIAL/PLATELET
Abs Immature Granulocytes: 0.03 10*3/uL (ref 0.00–0.07)
Basophils Absolute: 0 10*3/uL (ref 0.0–0.1)
Basophils Relative: 1 %
Eosinophils Absolute: 0.1 10*3/uL (ref 0.0–0.5)
Eosinophils Relative: 2 %
HCT: 37.8 % — ABNORMAL LOW (ref 39.0–52.0)
Hemoglobin: 11.9 g/dL — ABNORMAL LOW (ref 13.0–17.0)
Immature Granulocytes: 0 %
Lymphocytes Relative: 17 %
Lymphs Abs: 1.2 10*3/uL (ref 0.7–4.0)
MCH: 29.7 pg (ref 26.0–34.0)
MCHC: 31.5 g/dL (ref 30.0–36.0)
MCV: 94.3 fL (ref 80.0–100.0)
Monocytes Absolute: 0.5 10*3/uL (ref 0.1–1.0)
Monocytes Relative: 7 %
Neutro Abs: 5.2 10*3/uL (ref 1.7–7.7)
Neutrophils Relative %: 73 %
Platelets: 234 10*3/uL (ref 150–400)
RBC: 4.01 MIL/uL — ABNORMAL LOW (ref 4.22–5.81)
RDW: 15.3 % (ref 11.5–15.5)
WBC: 7.2 10*3/uL (ref 4.0–10.5)
nRBC: 0 % (ref 0.0–0.2)

## 2023-06-26 LAB — FOLATE: Folate: 39.8 ng/mL

## 2023-06-26 LAB — TYPE AND SCREEN
ABO/RH(D): O POS
Antibody Screen: NEGATIVE

## 2023-06-26 LAB — IRON AND TIBC
Iron: 31 ug/dL — ABNORMAL LOW (ref 45–182)
Saturation Ratios: 6 % — ABNORMAL LOW (ref 17.9–39.5)
TIBC: 492 ug/dL — ABNORMAL HIGH (ref 250–450)
UIBC: 461 ug/dL

## 2023-06-26 LAB — POC OCCULT BLOOD, ED: Fecal Occult Bld: POSITIVE — AB

## 2023-06-26 LAB — VITAMIN B12: Vitamin B-12: 546 pg/mL (ref 180–914)

## 2023-06-26 LAB — FERRITIN: Ferritin: 16 ng/mL — ABNORMAL LOW (ref 24–336)

## 2023-06-26 MED ORDER — INFLUENZA VAC A&B SURF ANT ADJ 0.5 ML IM SUSY
0.5000 mL | PREFILLED_SYRINGE | INTRAMUSCULAR | Status: AC
  Administered 2023-06-27: 0.5 mL via INTRAMUSCULAR
  Filled 2023-06-26: qty 0.5

## 2023-06-26 MED ORDER — ALBUTEROL SULFATE (2.5 MG/3ML) 0.083% IN NEBU: 2.5000 mg | INHALATION_SOLUTION | Freq: Four times a day (QID) | RESPIRATORY_TRACT | Status: DC | PRN

## 2023-06-26 MED ORDER — ATORVASTATIN CALCIUM 40 MG PO TABS
80.0000 mg | ORAL_TABLET | Freq: Every day | ORAL | Status: DC
  Administered 2023-06-27 – 2023-06-29 (×3): 80 mg via ORAL
  Filled 2023-06-26 (×3): qty 2

## 2023-06-26 MED ORDER — ONDANSETRON HCL 4 MG PO TABS: 4.0000 mg | ORAL_TABLET | Freq: Four times a day (QID) | ORAL | Status: DC | PRN

## 2023-06-26 MED ORDER — FLUTICASONE FUROATE-VILANTEROL 100-25 MCG/ACT IN AEPB
1.0000 | INHALATION_SPRAY | Freq: Every day | RESPIRATORY_TRACT | Status: DC
  Administered 2023-06-27 – 2023-06-29 (×3): 1 via RESPIRATORY_TRACT
  Filled 2023-06-26: qty 28

## 2023-06-26 MED ORDER — RISPERIDONE 1 MG PO TABS
1.0000 mg | ORAL_TABLET | Freq: Every day | ORAL | Status: DC
  Administered 2023-06-26 – 2023-06-28 (×3): 1 mg via ORAL
  Filled 2023-06-26 (×3): qty 1

## 2023-06-26 MED ORDER — IBUPROFEN 600 MG PO TABS: 600.0000 mg | ORAL_TABLET | Freq: Once | ORAL | Status: DC

## 2023-06-26 MED ORDER — PANTOPRAZOLE SODIUM 40 MG IV SOLR
40.0000 mg | Freq: Two times a day (BID) | INTRAVENOUS | Status: DC
  Administered 2023-06-26 – 2023-06-29 (×6): 40 mg via INTRAVENOUS
  Filled 2023-06-26 (×6): qty 10

## 2023-06-26 MED ORDER — MIRTAZAPINE 15 MG PO TABS
15.0000 mg | ORAL_TABLET | Freq: Every day | ORAL | Status: DC
  Administered 2023-06-26 – 2023-06-28 (×3): 15 mg via ORAL
  Filled 2023-06-26 (×3): qty 1

## 2023-06-26 MED ORDER — OXYCODONE HCL 5 MG PO TABS: 5.0000 mg | ORAL_TABLET | ORAL | Status: DC | PRN

## 2023-06-26 MED ORDER — PNEUMOCOCCAL 20-VAL CONJ VACC 0.5 ML IM SUSY
0.5000 mL | PREFILLED_SYRINGE | INTRAMUSCULAR | Status: AC
  Administered 2023-06-27: 0.5 mL via INTRAMUSCULAR
  Filled 2023-06-26: qty 0.5

## 2023-06-26 MED ORDER — ACETAMINOPHEN 325 MG PO TABS: 650.0000 mg | ORAL_TABLET | Freq: Four times a day (QID) | ORAL | Status: DC | PRN

## 2023-06-26 MED ORDER — UMECLIDINIUM BROMIDE 62.5 MCG/ACT IN AEPB
1.0000 | INHALATION_SPRAY | Freq: Every day | RESPIRATORY_TRACT | Status: DC
  Administered 2023-06-27 – 2023-06-29 (×3): 1 via RESPIRATORY_TRACT
  Filled 2023-06-26: qty 7

## 2023-06-26 MED ORDER — RISPERIDONE 0.5 MG PO TABS: 0.5000 mg | ORAL_TABLET | Freq: Every day | ORAL | Status: DC

## 2023-06-26 MED ORDER — MORPHINE SULFATE (PF) 2 MG/ML IV SOLN: 2.0000 mg | INTRAVENOUS | Status: DC | PRN

## 2023-06-26 MED ORDER — PRAVASTATIN SODIUM 40 MG PO TABS: 20.0000 mg | ORAL_TABLET | Freq: Every day | ORAL | Status: DC

## 2023-06-26 MED ORDER — ONDANSETRON HCL 4 MG/2ML IJ SOLN: 4.0000 mg | Freq: Four times a day (QID) | INTRAMUSCULAR | Status: DC | PRN

## 2023-06-26 MED ORDER — ACETAMINOPHEN 650 MG RE SUPP: 650.0000 mg | Freq: Four times a day (QID) | RECTAL | Status: DC | PRN

## 2023-06-26 MED ORDER — LORAZEPAM 1 MG PO TABS
1.0000 mg | ORAL_TABLET | Freq: Every day | ORAL | Status: DC
  Administered 2023-06-26 – 2023-06-29 (×4): 1 mg via ORAL
  Filled 2023-06-26 (×4): qty 1

## 2023-06-26 NOTE — Plan of Care (Signed)

## 2023-06-26 NOTE — H&P (Signed)
History and Physical    Patient: Jeff Miles [id number] DOB: Mar 23, 1951 DOA: 06/26/2023 DOS: the patient was seen and examined on 06/26/2023 PCP: Benita Stabile, MD  Patient coming from: {Point_of_Origin:[address]}  Chief Complaint:  Chief Complaint  Patient presents with   Rectal Bleeding   HPI: Jeff Miles is a 72 y.o. male with medical history significant of ***  Review of Systems: {ROS_Text:26778} Past Medical History:  Diagnosis Date   CHF (congestive heart failure) (HCC)    COPD (chronic obstructive pulmonary disease) (HCC)    Dementia (HCC)    alcohol related   Diabetes (HCC)    History of kidney stones    Hypertension    Unsteady gait    Wernicke's encephalopathy    Past Surgical History:  Procedure Laterality Date   BACK SURGERY     two   COLONOSCOPY WITH PROPOFOL N/A 11/28/2018   Procedure: COLONOSCOPY WITH PROPOFOL;  Surgeon: West Bali, MD;  [facility];  Service: Endoscopy;  Laterality: N/A;  7:30am   INNER EAR SURGERY     six surgery   MASS EXCISION N/A 10/08/2019   Procedure: EXCISION MASS ON TONGUE;  Surgeon: Newman Pies, MD;  Location: [facility];  Service: ENT;  Laterality: N/A;   NOSE SURGERY     x2   POLYPECTOMY  11/28/2018   Procedure: POLYPECTOMY;  Surgeon: West Bali, MD;  Location: [facility];  Service: Endoscopy;;  cecal polyp, ascending polyps x4, polyp at hepatic flexure, descending colon polyps x4, rectal polyps x4   Social History:  reports that he quit smoking about 7 years ago. His smoking use included cigarettes. He started smoking about 41 years ago. He has a 68 pack-year smoking history. He has never used smokeless tobacco. He reports that he does not currently use alcohol. He reports that he does not use drugs.  No Known Allergies  Family History  Problem Relation Age of Onset   Colon cancer Neg Hx    Liver disease Neg Hx     Prior to Admission medications   Medication Sig Start  Date End Date Taking? Authorizing Provider  albuterol (PROVENTIL) (2.5 MG/3ML) 0.083% nebulizer solution Take 2.5 mg by nebulization 3 (three) times daily.    [provider]  Artificial Tear Solution (SOOTHE XP OP) Apply 1 drop to eye 2 (two) times daily.     [provider]  COVID-19 mRNA bivalent vaccine, Moderna, (MODERNA COVID-19 BIVAL BOOSTER) 50 MCG/0.5ML injection Inject into the muscle. 07/13/21   Judyann Munson, MD  Fluticasone-Umeclidin-Vilant (TRELEGY ELLIPTA) 100-62.5-25 MCG/INH AEPB Inhale into the lungs.    Alver Fisher, RN  furosemide (LASIX) 40 MG tablet Take 1 tablet (40 mg total) by mouth daily. 11/11/15   Vassie Loll, MD  glipiZIDE (GLUCOTROL XL) 5 MG 24 hr tablet Take 5 mg by mouth 2 (two) times daily.    [provider]  loratadine (CLARITIN) 10 MG tablet Take 10 mg by mouth daily.    [provider]  LORazepam (ATIVAN) 1 MG tablet Take 1 mg by mouth daily.     [provider]  metFORMIN (GLUCOPHAGE) 500 MG tablet Take 500 mg by mouth 2 (two) times daily with a meal.    [provider]  mirtazapine (REMERON) 15 MG tablet Take 15 mg by mouth at bedtime.    [provider]  Multiple Vitamin (MULTIVITAMIN WITH MINERALS) TABS tablet Take 1 tablet by mouth daily. 11/11/15   Gwenlyn Perking,  Mikle Bosworth, MD  naltrexone (DEPADE) 50 MG tablet Take 50 mg by mouth daily.    [provider]  Omega-3 Fatty Acids (FISH OIL) 1000 MG CAPS Take 1,000 mg by mouth daily.     [provider]  Omeprazole 20 MG TBDD Take 20 mg by mouth daily.     [provider]  pravastatin (PRAVACHOL) 20 MG tablet Take 20 mg by mouth daily.    [provider]  risperiDONE (RISPERDAL) 0.5 MG tablet Take 0.5 mg by mouth at bedtime.    [provider]  Thiamine HCl (VITAMIN B-1) 250 MG tablet Take 250 mg by mouth daily.    [provider]  vitamin B-12 1000 MCG tablet Take 1 tablet (1,000 mcg total) by mouth  daily. 11/11/15   Vassie Loll, MD    Physical Exam: Vitals:   06/26/23 1730 06/26/23 1745 06/26/23 1800 06/26/23 2106  BP: (!) 153/87 (!) 158/86 (!) 161/85 (!) 156/96  Pulse: 99 99 93 92  Resp: 20 20 20 19   Temp:    99.3 F (37.4 C)  TempSrc:    Oral  SpO2: 91%  93% 92%  Weight:      Height:       *** Data Reviewed: {Tip this will not be part of the note when signed- Document your independent interpretation of telemetry tracing, EKG, lab, Radiology test or any other diagnostic tests. Add any new diagnostic test ordered today. (Optional):26781} {Results:26384}  Assessment and Plan: No notes have been filed under this hospital service. Service: Hospitalist     Advance Care Planning:   Code Status: Full Code ***  Consults: ***  Family Communication: ***  Severity of Illness: {Observation/Inpatient:21159}  Author: Lilyan Gilford, DO 06/26/2023 10:02 PM  For on call review [email or web address].

## 2023-06-26 NOTE — ED Provider Notes (Addendum)
[place] EMERGENCY DEPARTMENT AT [facility] Provider Note   CSN: [id number] Arrival date & time: 06/26/23  1606     History  Chief Complaint  Patient presents with   Rectal Bleeding    Jeff Miles is a 72 y.o. male.  Pt with c/o rectal bleeding in past two days. Episodic bleeding not necessarily associated with bm. Bms had been normal, no constipation, hard stools, straining or melena. Notes bright red blood in bowl. No other abnormal bruising or bleeding. No anticoagulant use. No rectal trauma. No rectal pain. No abd pain. Normal appetite. No nvd. No faintness or dizziness. Remote hx colonoscopy in chart, 2020, noted with internal hemorrhoids, polyps, and diverticulosis then.   The history is provided by the patient, a relative and medical records. The history is limited by the condition of the patient.  Rectal Bleeding Associated symptoms: no abdominal pain, no epistaxis, no fever and no vomiting        Home Medications Prior to Admission medications   Medication Sig Start Date End Date Taking? Authorizing Provider  albuterol (PROVENTIL) (2.5 MG/3ML) 0.083% nebulizer solution Take 2.5 mg by nebulization 3 (three) times daily.    [provider]  Artificial Tear Solution (SOOTHE XP OP) Apply 1 drop to eye 2 (two) times daily.     [provider]  COVID-19 mRNA bivalent vaccine, Moderna, (MODERNA COVID-19 BIVAL BOOSTER) 50 MCG/0.5ML injection Inject into the muscle. 07/13/21   Judyann Munson, MD  Fluticasone-Umeclidin-Vilant (TRELEGY ELLIPTA) 100-62.5-25 MCG/INH AEPB Inhale into the lungs.    Alver Fisher, RN  furosemide (LASIX) 40 MG tablet Take 1 tablet (40 mg total) by mouth daily. 11/11/15   Vassie Loll, MD  glipiZIDE (GLUCOTROL XL) 5 MG 24 hr tablet Take 5 mg by mouth 2 (two) times daily.    [provider]  loratadine (CLARITIN) 10 MG tablet Take 10 mg by mouth daily.    [provider]  LORazepam (ATIVAN) 1 MG  tablet Take 1 mg by mouth daily.     [provider]  metFORMIN (GLUCOPHAGE) 500 MG tablet Take 500 mg by mouth 2 (two) times daily with a meal.    [provider]  mirtazapine (REMERON) 15 MG tablet Take 15 mg by mouth at bedtime.    [provider]  Multiple Vitamin (MULTIVITAMIN WITH MINERALS) TABS tablet Take 1 tablet by mouth daily. 11/11/15   Vassie Loll, MD  naltrexone (DEPADE) 50 MG tablet Take 50 mg by mouth daily.    [provider]  Omega-3 Fatty Acids (FISH OIL) 1000 MG CAPS Take 1,000 mg by mouth daily.     [provider]  Omeprazole 20 MG TBDD Take 20 mg by mouth daily.     [provider]  pravastatin (PRAVACHOL) 20 MG tablet Take 20 mg by mouth daily.    [provider]  risperiDONE (RISPERDAL) 0.5 MG tablet Take 0.5 mg by mouth at bedtime.    [provider]  Thiamine HCl (VITAMIN B-1) 250 MG tablet Take 250 mg by mouth daily.    [provider]  vitamin B-12 1000 MCG tablet Take 1 tablet (1,000 mcg total) by mouth daily. 11/11/15   Vassie Loll, MD      Allergies    Patient has no known allergies.    Review of Systems   Review of Systems  Constitutional:  Negative for fever.  HENT:  Negative for nosebleeds.   Respiratory:  Negative for shortness of breath.  Cardiovascular:  Negative for chest pain.  Gastrointestinal:  Positive for hematochezia. Negative for abdominal pain, diarrhea and vomiting.  Genitourinary:  Negative for hematuria.  Musculoskeletal:  Negative for back pain.  Neurological:  Negative for syncope.  Hematological:  Does not bruise/bleed easily.    Physical Exam Updated Vital Signs BP (!) 161/85   Pulse 93   Temp 98.8 F (37.1 C)   Resp 20   Ht 1.727 m (5\' 8" )   Wt 108.9 kg   SpO2 93%   BMI 36.49 kg/m  Physical Exam Vitals and nursing note reviewed.  Constitutional:      Appearance: Normal appearance. He is well-developed.  HENT:     Head: Atraumatic.      Nose: Nose normal.     Mouth/Throat:     Mouth: Mucous membranes are moist.     Pharynx: Oropharynx is clear.  Eyes:     General: No scleral icterus.    Conjunctiva/sclera: Conjunctivae normal.  Neck:     Trachea: No tracheal deviation.  Cardiovascular:     Rate and Rhythm: Regular rhythm. Tachycardia present.     Pulses: Normal pulses.     Heart sounds: Normal heart sounds. No murmur heard.    No friction rub. No gallop.  Pulmonary:     Effort: Pulmonary effort is normal. No accessory muscle usage or respiratory distress.     Breath sounds: Normal breath sounds.  Abdominal:     General: Bowel sounds are normal. There is no distension.     Palpations: Abdomen is soft. There is no mass.     Tenderness: There is no abdominal tenderness.  Genitourinary:    Comments: No cva tenderness. Rectal no mass felt. V dark brown stool, heme pos.  Musculoskeletal:        General: No swelling or tenderness.     Cervical back: Normal range of motion and neck supple. No rigidity.  Skin:    General: Skin is warm and dry.     Findings: No rash.  Neurological:     Mental Status: He is alert.     Comments: Alert, speech clear.   Psychiatric:        Mood and Affect: Mood normal.     ED Results / Procedures / Treatments   Labs (all labs ordered are listed, but only abnormal results are displayed) Results for orders placed or performed during the hospital encounter of 06/26/23  CBC with Differential  Result Value Ref Range   WBC 7.2 4.0 - 10.5 K/uL   RBC 4.01 (L) 4.22 - 5.81 MIL/uL   Hemoglobin 11.9 (L) 13.0 - 17.0 g/dL   HCT 84.1 (L) 32.4 - 40.1 %   MCV 94.3 80.0 - 100.0 fL   MCH 29.7 26.0 - 34.0 pg   MCHC 31.5 30.0 - 36.0 g/dL   RDW 02.7 25.3 - 66.4 %   Platelets 234 150 - 400 K/uL   nRBC 0.0 0.0 - 0.2 %   Neutrophils Relative % 73 %   Neutro Abs 5.2 1.7 - 7.7 K/uL   Lymphocytes Relative 17 %   Lymphs Abs 1.2 0.7 - 4.0 K/uL   Monocytes Relative 7 %   Monocytes Absolute 0.5 0.1  - 1.0 K/uL   Eosinophils Relative 2 %   Eosinophils Absolute 0.1 0.0 - 0.5 K/uL   Basophils Relative 1 %   Basophils Absolute 0.0 0.0 - 0.1 K/uL   Immature Granulocytes 0 %   Abs Immature Granulocytes 0.03 0.00 -  0.07 K/uL  Comprehensive metabolic panel  Result Value Ref Range   Sodium 139 135 - 145 mmol/L   Potassium 4.0 3.5 - 5.1 mmol/L   Chloride 101 98 - 111 mmol/L   CO2 26 22 - 32 mmol/L   Glucose, Bld 162 (H) 70 - 99 mg/dL   BUN 12 8 - 23 mg/dL   Creatinine, Ser 1.47 0.61 - 1.24 mg/dL   Calcium 9.6 8.9 - 82.9 mg/dL   Total Protein 7.5 6.5 - 8.1 g/dL   Albumin 4.0 3.5 - 5.0 g/dL   AST 29 15 - 41 U/L   ALT 20 0 - 44 U/L   Alkaline Phosphatase 81 38 - 126 U/L   Total Bilirubin 0.5 0.3 - 1.2 mg/dL   GFR, Estimated >56 >21 mL/min   Anion gap 12 5 - 15  Type and screen [facility]  Result Value Ref Range   ABO/RH(D) O POS    Antibody Screen NEG    Sample Expiration      06/29/2023,2359 Performed at [facility], [address] [place], [place] [address]      EKG EKG Interpretation Date/Time:  Sunday June 26 2023 16:16:45 EDT Ventricular Rate:  123 PR Interval:  148 QRS Duration:  126 QT Interval:  338 QTC Calculation: 483 R Axis:   218  Text Interpretation: Sinus tachycardia Right bundle branch block Confirmed by Cathren Laine [phone]) on 06/26/2023 4:24:40 PM  Radiology No results found.  Procedures Procedures    Medications Ordered in ED Medications  ibuprofen (ADVIL) tablet 600 mg (600 mg Oral Not Given 06/26/23 1724)    ED Course/ Medical Decision Making/ A&P                                 Medical Decision Making Problems Addressed: Rectal bleeding: acute illness or injury with systemic symptoms that poses a threat to life or bodily functions  Amount and/or Complexity of Data Reviewed Independent Historian: spouse    Details: hx External Data Reviewed: notes. Labs: ordered. Decision-making details documented in ED  Course. ECG/medicine tests: ordered and independent interpretation performed. Decision-making details documented in ED Course. Discussion of management or test interpretation with external provider(s): medicine  Risk Prescription drug management. Decision regarding hospitalization.   Iv ns. Continuous pulse ox and cardiac monitoring. Labs ordered/sent.  Differential diagnosis includes gi bleeding, diverticular bleeding, intern hem, fissure, etc. Dispo decision including potential need for admission considered - will get labs and imaging and reassess.   Reviewed nursing notes and prior charts for additional history. External reports reviewed. Additional history from: family.   Cardiac monitor: sinus rhythm, rate  Labs reviewed/interpreted by me - hgb 12, lower than prior. Stool is heme pos.   Given several episodes blood per rectum, ?volume, will consult medicine for admission.  Discussed pt - will admit. Hospitalists consulted GI.   Recheck pt stable, no distress, hr normal. Bp 160s/80s.               Final Clinical Impression(s) / ED Diagnoses Final diagnoses:  None    Rx / DC Orders ED Discharge Orders     None        Cathren Laine, MD 06/26/23 1928

## 2023-06-26 NOTE — ED Triage Notes (Signed)
Bright red blood from rectum started today Wife stated that the commode had blood each time pt used it. No clots Pt stated that he has been bleeding without BM Denies hemorrhoids Pt stated his underwear is full of blood.

## 2023-06-27 ENCOUNTER — Encounter (HOSPITAL_COMMUNITY): Payer: Self-pay | Admitting: Family Medicine

## 2023-06-27 DIAGNOSIS — E119 Type 2 diabetes mellitus without complications: Secondary | ICD-10-CM

## 2023-06-27 DIAGNOSIS — K921 Melena: Secondary | ICD-10-CM | POA: Diagnosis not present

## 2023-06-27 DIAGNOSIS — K5791 Diverticulosis of intestine, part unspecified, without perforation or abscess with bleeding: Secondary | ICD-10-CM

## 2023-06-27 DIAGNOSIS — K219 Gastro-esophageal reflux disease without esophagitis: Secondary | ICD-10-CM | POA: Diagnosis not present

## 2023-06-27 DIAGNOSIS — F419 Anxiety disorder, unspecified: Secondary | ICD-10-CM | POA: Insufficient documentation

## 2023-06-27 DIAGNOSIS — Z860100 Personal history of colon polyps, unspecified: Secondary | ICD-10-CM | POA: Diagnosis not present

## 2023-06-27 DIAGNOSIS — D509 Iron deficiency anemia, unspecified: Secondary | ICD-10-CM | POA: Diagnosis not present

## 2023-06-27 LAB — COMPREHENSIVE METABOLIC PANEL
ALT: 17 U/L (ref 0–44)
AST: 22 U/L (ref 15–41)
Albumin: 3.5 g/dL (ref 3.5–5.0)
Alkaline Phosphatase: 66 U/L (ref 38–126)
Anion gap: 9 (ref 5–15)
BUN: 12 mg/dL (ref 8–23)
CO2: 29 mmol/L (ref 22–32)
Calcium: 9.2 mg/dL (ref 8.9–10.3)
Chloride: 103 mmol/L (ref 98–111)
Creatinine, Ser: 1.08 mg/dL (ref 0.61–1.24)
GFR, Estimated: >60 mL/min (ref >60)
Glucose, Bld: 115 mg/dL — ABNORMAL HIGH (ref 70–99)
Potassium: 3.3 mmol/L — ABNORMAL LOW (ref 3.5–5.1)
Sodium: 141 mmol/L (ref 135–145)
Total Bilirubin: 0.5 mg/dL (ref 0.3–1.2)
Total Protein: 6.4 g/dL — ABNORMAL LOW (ref 6.5–8.1)

## 2023-06-27 LAB — CBC
HCT: 33.6 % — ABNORMAL LOW (ref 39.0–52.0)
HCT: 34.4 % — ABNORMAL LOW (ref 39.0–52.0)
Hemoglobin: 10.7 g/dL — ABNORMAL LOW (ref 13.0–17.0)
Hemoglobin: 10.7 g/dL — ABNORMAL LOW (ref 13.0–17.0)
MCH: 29.6 pg (ref 26.0–34.0)
MCH: 29.9 pg (ref 26.0–34.0)
MCHC: 31.1 g/dL (ref 30.0–36.0)
MCHC: 31.8 g/dL (ref 30.0–36.0)
MCV: 93.9 fL (ref 80.0–100.0)
MCV: 95.3 fL (ref 80.0–100.0)
Platelets: 212 10*3/uL (ref 150–400)
Platelets: 214 10*3/uL (ref 150–400)
RBC: 3.58 MIL/uL — ABNORMAL LOW (ref 4.22–5.81)
RBC: 3.61 MIL/uL — ABNORMAL LOW (ref 4.22–5.81)
RDW: 15.1 % (ref 11.5–15.5)
RDW: 15.3 % (ref 11.5–15.5)
WBC: 6.3 10*3/uL (ref 4.0–10.5)
WBC: 7.8 10*3/uL (ref 4.0–10.5)
nRBC: 0 % (ref 0.0–0.2)
nRBC: 0 % (ref 0.0–0.2)

## 2023-06-27 LAB — GLUCOSE, CAPILLARY
Glucose-Capillary: 136 mg/dL — ABNORMAL HIGH (ref 70–99)
Glucose-Capillary: 117 mg/dL — ABNORMAL HIGH (ref 70–99)
Glucose-Capillary: 118 mg/dL — ABNORMAL HIGH (ref 70–99)
Glucose-Capillary: 125 mg/dL — ABNORMAL HIGH (ref 70–99)

## 2023-06-27 LAB — MAGNESIUM: Magnesium: 1.4 mg/dL — ABNORMAL LOW (ref 1.7–2.4)

## 2023-06-27 LAB — HEMOGLOBIN A1C
Hgb A1c MFr Bld: 5.6 % (ref 4.8–5.6)
Mean Plasma Glucose: 114.02 mg/dL

## 2023-06-27 MED ORDER — BISACODYL 5 MG PO TBEC
10.0000 mg | DELAYED_RELEASE_TABLET | Freq: Once | ORAL | Status: AC
  Administered 2023-06-27: 10 mg via ORAL
  Filled 2023-06-27: qty 2

## 2023-06-27 MED ORDER — POTASSIUM CHLORIDE CRYS ER 20 MEQ PO TBCR
40.0000 meq | EXTENDED_RELEASE_TABLET | Freq: Once | ORAL | Status: AC
  Administered 2023-06-27: 40 meq via ORAL
  Filled 2023-06-27: qty 2

## 2023-06-27 MED ORDER — KETOCONAZOLE 2 % EX CREA
TOPICAL_CREAM | Freq: Two times a day (BID) | CUTANEOUS | Status: DC
  Filled 2023-06-27 (×3): qty 15

## 2023-06-27 MED ORDER — INSULIN ASPART 100 UNIT/ML IJ SOLN: 0.0000 [IU] | Freq: Every day | INTRAMUSCULAR | Status: DC

## 2023-06-27 MED ORDER — PEG 3350-KCL-NA BICARB-NACL 420 G PO SOLR
4000.0000 mL | Freq: Once | ORAL | Status: AC
  Administered 2023-06-27: 4000 mL via ORAL

## 2023-06-27 MED ORDER — MAGNESIUM SULFATE 4 GM/100ML IV SOLN
4.0000 g | Freq: Once | INTRAVENOUS | Status: AC
  Administered 2023-06-27: 4 g via INTRAVENOUS
  Filled 2023-06-27: qty 100

## 2023-06-27 MED ORDER — MAGNESIUM SULFATE 2 GM/50ML IV SOLN
2.0000 g | Freq: Once | INTRAVENOUS | Status: DC
  Filled 2023-06-27: qty 50

## 2023-06-27 MED ORDER — HYDRALAZINE HCL 20 MG/ML IJ SOLN
10.0000 mg | INTRAMUSCULAR | Status: DC | PRN
  Administered 2023-06-28: 10 mg via INTRAVENOUS
  Filled 2023-06-27: qty 1

## 2023-06-27 MED ORDER — LISINOPRIL 10 MG PO TABS
10.0000 mg | ORAL_TABLET | Freq: Every day | ORAL | Status: DC
  Administered 2023-06-27 – 2023-06-29 (×3): 10 mg via ORAL
  Filled 2023-06-27 (×3): qty 1

## 2023-06-27 MED ORDER — INSULIN ASPART 100 UNIT/ML IJ SOLN
0.0000 [IU] | Freq: Three times a day (TID) | INTRAMUSCULAR | Status: DC
  Administered 2023-06-27 – 2023-06-29 (×3): 2 [IU] via SUBCUTANEOUS

## 2023-06-27 NOTE — Assessment & Plan Note (Signed)
-   Continue Remeron, Risperdal, Ativan

## 2023-06-27 NOTE — Assessment & Plan Note (Signed)
-   Continue Trelegy

## 2023-06-27 NOTE — Progress Notes (Signed)
PROGRESS NOTE   Jeff Miles  [id number] DOB: 08-16-1951 DOA: 06/26/2023 PCP: Benita Stabile, MD   Chief Complaint  Patient presents with   Rectal Bleeding   Level of care: Telemetry  Brief Admission History:   72 y.o. male with medical history significant of CHF, COPD, dementia with anxiety, diabetes mellitus type 2, hypertension, Wernicke's encephalopathy, and more presents the ED with a chief complaint of GI bleed.  Patient is extremely hard of hearing.  His wife provides most of the history.  She reports it started a couple days ago that he was having blood in his brief.  It has been painless.  On the day of presentation got really bad.  He had 4 episodes that filled the toilet with blood.  She reports has never had bleeding like this before.  His last colonoscopy was in 2020.  He had 15 polyps removed, was noted to have diverticulosis, noted to have internal hemorrhoids.  Patient denies any symptoms of anemia including but not limited to no lightheadedness, no chest pain, no paresthesias, no shortness of breath.  He is not on any blood thinner.  He has not complained of any pain at all.  He was FOBT positive in the ER.  No other complaints at this time.     Assessment and Plan:  Working diagnosis of diverticular bleed / Lower GI bleed  - GI consult appreciated with plan for colonoscopy and possible EGD if indeterminate. - Protonix twice daily - Hold pharmaceutical DVT prophylaxis - Trend CBC - Patient typed and screened - GI prep per GI service as ordered   Hypomagnesemia - Magnesium 1.4 - Possibly due to GI losses - Repleted, recheck in AM   GERD (gastroesophageal reflux disease) - Continue PPI  Anxiety - Continue Remeron, Risperdal, Ativan  Diabetes mellitus type 2 in nonobese  - Holding metformin and glipizide - Sliding scale coverage - Monitor CBGs CBG (last 3)  Recent Labs    06/27/23 0841 06/27/23 1118  GLUCAP 136* 117*   COPD (chronic obstructive  pulmonary disease) - Continue Trelegy (formulary equivalent)  DVT prophylaxis: SCD Code Status: Full  Family Communication: updated at bedside  Disposition:     Consultants:  GI  Procedures:  Colonoscopy pending   Antimicrobials:    Subjective: No specific complaints, reports blood in toilet is less today   Objective: Vitals:   06/27/23 0618 06/27/23 0706 06/27/23 0906 06/27/23 1318  BP: (!) 170/84  (!) 162/94 (!) 171/79  Pulse: 91  80 82  Resp: 18   (!) 22  Temp: 98.7 F (37.1 C)  97.6 F (36.4 C) 98.6 F (37 C)  TempSrc: Oral  Oral Oral  SpO2: 92% 90% 95% 97%  Weight:      Height:        Intake/Output Summary (Last 24 hours) at 06/27/2023 1538 Last data filed at 06/27/2023 1330 Gross per 24 hour  Intake 360 ml  Output --  Net 360 ml   Filed Weights   06/26/23 1615  Weight: 108.9 kg   Examination:  General exam: Appears calm and comfortable  Respiratory system: Clear to auscultation. Respiratory effort normal. Cardiovascular system: normal S1 & S2 heard. No JVD, murmurs, rubs, gallops or clicks. No pedal edema. Gastrointestinal system: Abdomen is nondistended, soft and nontender. No organomegaly or masses felt. Normal bowel sounds heard. Central nervous system: Alert and oriented. No focal neurological deficits. Extremities: Symmetric 5 x 5 power. Skin: No rashes, lesions or ulcers. Psychiatry: Judgement and  insight appear normal. Mood & affect appropriate.   Data Reviewed: I have personally reviewed following labs and imaging studies  CBC: Recent Labs  Lab 06/26/23 1632 06/27/23 0028 06/27/23 0344  WBC 7.2 7.8 6.3  NEUTROABS 5.2  --   --   HGB 11.9* 10.7* 10.7*  HCT 37.8* 33.6* 34.4*  MCV 94.3 93.9 95.3  PLT 234 212 214    Basic Metabolic Panel: Recent Labs  Lab 06/26/23 1632 06/27/23 0344  NA 139 141  K 4.0 3.3*  CL 101 103  CO2 26 29  GLUCOSE 162* 115*  BUN 12 12  CREATININE 1.19 1.08  CALCIUM 9.6 9.2  MG  --  1.4*     CBG: Recent Labs  Lab 06/27/23 0841 06/27/23 1118  GLUCAP 136* 117*    No results found for this or any previous visit (from the past 240 hour(s)).   Radiology Studies: No results found.  Scheduled Meds:  atorvastatin  80 mg Oral Daily   bisacodyl  10 mg Oral Once   fluticasone furoate-vilanterol  1 puff Inhalation Daily   And   umeclidinium bromide  1 puff Inhalation Daily   insulin aspart  0-15 Units Subcutaneous TID WC   insulin aspart  0-5 Units Subcutaneous QHS   LORazepam  1 mg Oral Daily   mirtazapine  15 mg Oral QHS   pantoprazole (PROTONIX) IV  40 mg Intravenous Q12H   risperiDONE  1 mg Oral QHS   Continuous Infusions:   LOS: 0 days   Time spent: 44 mins  Mallory Enriques Laural Benes, MD How to contact the [facility] Attending or Consulting provider 7A - 7P or covering provider during after hours 7P -7A, for this patient?  Check the care team in [facility] and look for a) attending/consulting [facility] provider listed and b) the [facility] team listed Log into [email or web address] to find provider on call.  Locate the [facility] provider you are looking for under [facility] and page to a number that you can be directly reached. If you still have difficulty reaching the provider, please page the [facility] (Director on Call) for the Hospitalists listed on amion for assistance.  06/27/2023, 3:38 PM

## 2023-06-27 NOTE — Consult Note (Signed)
Gastroenterology Consult   Referring Provider: Dr. Laural Benes  Primary Care Physician:  Benita Stabile, MD Primary Gastroenterologist:  previously Dr. Darrick Penna   Patient ID: Jeff Miles; [id number]; Dec 26, 1950   Admit date: 06/26/2023  LOS: 0 days   Date of Consultation: 06/27/2023  Reason for Consultation:  Rectal bleeding  History of Present Illness   Jeff Miles is a 72 y.o. year old male with past medical history significant for  CHF, COPD, dementia with anxiety, diabetes mellitus type 2, hypertension, Wernicke's encephalopathy, prior alcohol use but none since 2017, multiple polyps and advanced polyps in 2020 overdue for surveillance, presenting to the ED yesterday with painless hematochezia. Wife present at bedside and provides majority of information due to his significant difficulty hearing.   In ED: Hgb 11.9 on admission (none recent to compare), this morning 10.7. Ferritin low at 16. Iron low at 31. Heme positive.   Patient states yesterday had a bowel movement without straining then large amount of blood. Large amount yesterday, multiple episodes brbpr. Small amount brbpr this morning. No straining. No abdominal pain. More gas. No constipation. More on the looser end.  No nausea or vomiting. No dysphagia. Omeprazole BID, no prior EGD.  No aspirin powders or NSAIDs. No anticoagulation. No ETOH since 2017. [place] in 2019 with questionable fatty liver vs cirrhosis, spleen normal. No thrombocytopenia.    Colonoscopy March 2020: 13 2-6 mm polyps in rectum, sigmoid, descending, ascending, and cecum, s/p removal, one 20-30 mm polyp at hepatic flexure s/p removal with clip placement, one 20-30 mm polyps in mid descending colon, s/p removal, tattoo and injection, moderate diverticulosis. Path with sessile serrated adenomas, one tubulovillous adenoma, and tubular adenoma.   Past Medical History:  Diagnosis Date   CHF (congestive heart failure) (HCC)    COPD (chronic obstructive  pulmonary disease) (HCC)    Dementia (HCC)    alcohol related   Diabetes (HCC)    History of kidney stones    Hypertension    Unsteady gait    Wernicke's encephalopathy     Past Surgical History:  Procedure Laterality Date   BACK SURGERY     two   COLONOSCOPY WITH PROPOFOL N/A 11/28/2018   Procedure: COLONOSCOPY WITH PROPOFOL;  Surgeon: West Bali, MD;  Location: [facility];  Service: Endoscopy;  Laterality: N/A;  7:30am   INNER EAR SURGERY     six surgery   MASS EXCISION N/A 10/08/2019   Procedure: EXCISION MASS ON TONGUE;  Surgeon: Newman Pies, MD;  Location: [facility];  Service: ENT;  Laterality: N/A;   NOSE SURGERY     x2   POLYPECTOMY  11/28/2018   Procedure: POLYPECTOMY;  Surgeon: West Bali, MD;  Location: [facility];  Service: Endoscopy;;  cecal polyp, ascending polyps x4, polyp at hepatic flexure, descending colon polyps x4, rectal polyps x4    Prior to Admission medications   Medication Sig Start Date End Date Taking? Authorizing Provider  albuterol (PROVENTIL) (2.5 MG/3ML) 0.083% nebulizer solution Take 2.5 mg by nebulization 3 (three) times daily.    [provider]  Artificial Tear Solution (SOOTHE XP OP) Apply 1 drop to eye 2 (two) times daily.     [provider]  COVID-19 mRNA bivalent vaccine, Moderna, (MODERNA COVID-19 BIVAL BOOSTER) 50 MCG/0.5ML injection Inject into the muscle. 07/13/21   Judyann Munson, MD  Fluticasone-Umeclidin-Vilant (TRELEGY ELLIPTA) 100-62.5-25 MCG/INH AEPB Inhale into the lungs.    Alver Fisher, RN  furosemide (  LASIX) 40 MG tablet Take 1 tablet (40 mg total) by mouth daily. 11/11/15   Vassie Loll, MD  glipiZIDE (GLUCOTROL XL) 5 MG 24 hr tablet Take 5 mg by mouth 2 (two) times daily.    [provider]  loratadine (CLARITIN) 10 MG tablet Take 10 mg by mouth daily.    [provider]  LORazepam (ATIVAN) 1 MG tablet Take 1 mg by mouth daily.     [provider]   metFORMIN (GLUCOPHAGE) 500 MG tablet Take 500 mg by mouth 2 (two) times daily with a meal.    [provider]  mirtazapine (REMERON) 15 MG tablet Take 15 mg by mouth at bedtime.    [provider]  Multiple Vitamin (MULTIVITAMIN WITH MINERALS) TABS tablet Take 1 tablet by mouth daily. 11/11/15   Vassie Loll, MD  naltrexone (DEPADE) 50 MG tablet Take 50 mg by mouth daily.    [provider]  Omega-3 Fatty Acids (FISH OIL) 1000 MG CAPS Take 1,000 mg by mouth daily.     [provider]  Omeprazole 20 MG TBDD Take 20 mg by mouth daily.     [provider]  pravastatin (PRAVACHOL) 20 MG tablet Take 20 mg by mouth daily.    [provider]  risperiDONE (RISPERDAL) 0.5 MG tablet Take 0.5 mg by mouth at bedtime.    [provider]  Thiamine HCl (VITAMIN B-1) 250 MG tablet Take 250 mg by mouth daily.    [provider]  vitamin B-12 1000 MCG tablet Take 1 tablet (1,000 mcg total) by mouth daily. 11/11/15   Vassie Loll, MD    Current Facility-Administered Medications  Medication Dose Route Frequency Provider Last Rate Last Admin   acetaminophen (TYLENOL) tablet 650 mg  650 mg Oral Q6H PRN Zierle-Ghosh, Asia B, DO       Or   acetaminophen (TYLENOL) suppository 650 mg  650 mg Rectal Q6H PRN Zierle-Ghosh, Asia B, DO       albuterol (PROVENTIL) (2.5 MG/3ML) 0.083% nebulizer solution 2.5 mg  2.5 mg Nebulization Q6H PRN Zierle-Ghosh, Asia B, DO       atorvastatin (LIPITOR) tablet 80 mg  80 mg Oral Daily Zierle-Ghosh, Asia B, DO       fluticasone furoate-vilanterol (BREO ELLIPTA) 100-25 MCG/ACT 1 puff  1 puff Inhalation Daily Zierle-Ghosh, Asia B, DO   1 puff at 06/27/23 0705   And   umeclidinium bromide (INCRUSE ELLIPTA) 62.5 MCG/ACT 1 puff  1 puff Inhalation Daily Zierle-Ghosh, Asia B, DO   1 puff at 06/27/23 0705   influenza vaccine adjuvanted (FLUAD) injection 0.5 mL  0.5 mL Intramuscular Tomorrow-1000 Zierle-Ghosh, Asia B, DO        insulin aspart (novoLOG) injection 0-15 Units  0-15 Units Subcutaneous TID WC Zierle-Ghosh, Asia B, DO       insulin aspart (novoLOG) injection 0-5 Units  0-5 Units Subcutaneous QHS Zierle-Ghosh, Asia B, DO       LORazepam (ATIVAN) tablet 1 mg  1 mg Oral Daily Zierle-Ghosh, Asia B, DO   1 mg at 06/26/23 2201   magnesium sulfate IVPB 4 g 100 mL  4 g Intravenous Once Johnson, Clanford L, MD       mirtazapine (REMERON) tablet 15 mg  15 mg Oral QHS Zierle-Ghosh, Asia B, DO   15 mg at 06/26/23 2201   morphine (PF) 2 MG/ML injection 2 mg  2 mg Intravenous Q2H PRN Zierle-Ghosh, Asia B, DO  ondansetron (ZOFRAN) tablet 4 mg  4 mg Oral Q6H PRN Zierle-Ghosh, Asia B, DO       Or   ondansetron (ZOFRAN) injection 4 mg  4 mg Intravenous Q6H PRN Zierle-Ghosh, Asia B, DO       oxyCODONE (Oxy IR/ROXICODONE) immediate release tablet 5 mg  5 mg Oral Q4H PRN Zierle-Ghosh, Asia B, DO       pantoprazole (PROTONIX) injection 40 mg  40 mg Intravenous Q12H Zierle-Ghosh, Asia B, DO   40 mg at 06/26/23 2201   pneumococcal 20-valent conjugate vaccine (PREVNAR 20) injection 0.5 mL  0.5 mL Intramuscular Tomorrow-1000 Zierle-Ghosh, Asia B, DO       potassium chloride SA (KLOR-CON M) CR tablet 40 mEq  40 mEq Oral Once Johnson, Clanford L, MD       risperiDONE (RISPERDAL) tablet 1 mg  1 mg Oral QHS Zierle-Ghosh, Asia B, DO   1 mg at 06/26/23 2201    Allergies as of 06/26/2023   (No Known Allergies)    Family History  Problem Relation Age of Onset   Colon cancer Neg Hx    Liver disease Neg Hx     Social History   Socioeconomic History   Marital status: Married    Spouse name: Not on file   Number of children: Not on file   Years of education: Not on file   Highest education level: Not on file  Occupational History   Not on file  Tobacco Use   Smoking status: Former    Current packs/day: 0.00    Average packs/day: 2.0 packs/day for 34.0 years (68.0 ttl pk-yrs)    Types: Cigarettes    Start date:  10/23/1981    Quit date: 10/24/2015    Years since quitting: 7.6   Smokeless tobacco: Never  Vaping Use   Vaping status: Never Used  Substance and Sexual Activity   Alcohol use: Not Currently    Comment: None since 2017   Drug use: No   Sexual activity: Not Currently  Other Topics Concern   Not on file  Social History Narrative   Not on file   Social Determinants of Health   Financial Resource Strain: Not on file  Food Insecurity: No Food Insecurity (06/26/2023)   Hunger Vital Sign    Worried About Running Out of Food in the Last Year: Never true    Ran Out of Food in the Last Year: Never true  Transportation Needs: No Transportation Needs (06/26/2023)   PRAPARE - Administrator, Civil Service (Medical): No    Lack of Transportation (Non-Medical): No  Physical Activity: Not on file  Stress: Not on file  Social Connections: Not on file  Intimate Partner Violence: Not At Risk (06/26/2023)   Humiliation, Afraid, Rape, and Kick questionnaire    Fear of Current or Ex-Partner: No    Emotionally Abused: No    Physically Abused: No    Sexually Abused: No     Review of Systems   Gen: Denies any fever, chills, loss of appetite, change in weight or weight loss CV: Denies chest pain, heart palpitations, syncope, edema  Resp: Denies shortness of breath with rest, cough, wheezing, coughing up blood, and pleurisy. GI: see HPI GU : Denies urinary burning, blood in urine, urinary frequency, and urinary incontinence. MS: Denies joint pain, limitation of movement, swelling, cramps, and atrophy.  Derm: Denies rash, itching, dry skin, hives. Psych: Denies depression, anxiety, memory loss, hallucinations, and confusion. Heme: Denies bruising  or bleeding Neuro:  Denies any headaches, dizziness, paresthesias, shaking  Physical Exam   Vital Signs in last 24 hours: Temp:  [98.5 F (36.9 C)-99.3 F (37.4 C)] 98.7 F (37.1 C) (10/14 0618) Pulse Rate:  [91-126] 91 (10/14  0618) Resp:  [18-23] 18 (10/14 0618) BP: (133-184)/(82-96) 170/84 (10/14 0618) SpO2:  [90 %-93 %] 90 % (10/14 0706) Weight:  [108.9 kg] 108.9 kg (10/13 1615) Last BM Date : 06/26/23  General:   Alert,  Well-developed, well-nourished, pleasant and cooperative in NAD Head:  Normocephalic and atraumatic. Eyes:  Sclera clear, no icterus.   Conjunctiva pink. Ears:  very hard of hearing  Lungs:  Clear throughout to auscultation.    Heart:  S1 S2 present without murmurs Abdomen:  Soft, nontender and nondistended. No masses, hepatosplenomegaly or hernias noted. Normal bowel sounds, without guarding, and without rebound. Small umbilical hernia Rectal: deferred   Msk:  Symmetrical without gross deformities. Normal posture. Extremities:  With lower extremity edema, chronic venous stasis changes lower extremities Neurologic:  Alert and  oriented x4. Psych:  Alert and cooperative. Normal mood and affect.  Intake/Output from previous day: No intake/output data recorded. Intake/Output this shift: No intake/output data recorded.    Labs/Studies   Recent Labs Recent Labs    06/26/23 1632 06/27/23 0028 06/27/23 0344  WBC 7.2 7.8 6.3  HGB 11.9* 10.7* 10.7*  HCT 37.8* 33.6* 34.4*  PLT 234 212 214   BMET Recent Labs    06/26/23 1632 06/27/23 0344  NA 139 141  K 4.0 3.3*  CL 101 103  CO2 26 29  GLUCOSE 162* 115*  BUN 12 12  CREATININE 1.19 1.08  CALCIUM 9.6 9.2   LFT Recent Labs    06/26/23 1632 06/27/23 0344  PROT 7.5 6.4*  ALBUMIN 4.0 3.5  AST 29 22  ALT 20 17  ALKPHOS 81 66  BILITOT 0.5 0.5    Radiology/Studies No results found.   Assessment   Jeff Miles is a 72 y.o. year old male with past medical history significant for  CHF, COPD, dementia with anxiety, diabetes mellitus type 2, hypertension, Wernicke's encephalopathy, prior alcohol use but none since 2017, multiple polyps and advanced polyps in 2020 overdue for surveillance, presenting to the ED  yesterday with painless hematochezia. Wife present at bedside and provides majority of information due to his significant difficulty hearing.    Rectal bleeding: noted as painless, known diverticula on prior colonoscopy and suspected diverticular, does not appear ischemic in nature. Known numerous polyps in 2020 (some advanced) and overdue for surveillance colonoscopy. Does not appear consistent with rapid transit UGI bleed. Hgb 11.9 on admission and 10.7 this morning but unknown baseline. IDA noted with ferritin 16. Needs diagnostic colonoscopy this admission and will plan on tomorrow.   Notably, he has had no alcohol since 2017. [place] in 2019 with questionable fatty liver vs cirrhosis but spleen normal. No thrombocytopenia. Suspect more hepatic steatosis.     Plan / Recommendations    Clear liquids NPO after midnight Golytely this afternoon Tap water enemas in am Continue PPI Proceed with colonoscopy +/- EGD by Dr. Levon Hedger on 10/15: the risks, benefits, and alternatives have been discussed with the patient in detail. The patient states understanding and desires to proceed.       06/27/2023, 8:34 AM  Gelene Mink, PhD, ANP-BC [facility]

## 2023-06-27 NOTE — Progress Notes (Signed)
Transition of Care Department [facility]) has reviewed patient and no TOC needs have been identified at this time. We will continue to monitor patient advancement through interdisciplinary progression rounds. If new patient transition needs arise, please place a TOC consult.   06/27/23 0751  TOC Brief Assessment  Insurance and Status Reviewed  Patient has primary care physician Yes  Home environment has been reviewed Lives with wife.  Prior level of function: Fairly independent.  Prior/Current Home Services No current home services  Social Determinants of Health Reivew SDOH reviewed no interventions necessary  Readmission risk has been reviewed Yes  Transition of care needs no transition of care needs at this time

## 2023-06-27 NOTE — Plan of Care (Signed)
  Problem: Education: Goal: Knowledge of General Education information will improve Description: Including pain rating scale, medication(s)/side effects and non-pharmacologic comfort measures Outcome: Progressing   Problem: Pain Managment: Goal: General experience of comfort will improve Outcome: Progressing   

## 2023-06-27 NOTE — Progress Notes (Signed)
Nurse at bedside,patient hard of hearing ,alert and oriented times two to person and place.No c/o pain or discomfort noted. IV leaking this am, IV discontinued,catheter intact.  New IV restarted 22 gauge to left anterior forearm,patient tolerated procedure. Plan of care on going family at bedside.

## 2023-06-27 NOTE — Care Management Obs Status (Signed)
MEDICARE OBSERVATION STATUS NOTIFICATION   Patient Details  Name: Jeff Miles MRN: [id number] Date of Birth: Apr 28, 1951   Medicare Observation Status Notification Given:  Yes    Corey Harold 06/27/2023, 5:04 PM

## 2023-06-27 NOTE — Progress Notes (Signed)
Mobility Specialist Progress Note:    06/27/23 1345  Mobility  Activity Ambulated with assistance in hallway  Level of Assistance Modified independent, requires aide device or extra time  Assistive Device None (IV pole)  Distance Ambulated (ft) 30 ft  Range of Motion/Exercises Active;All extremities  Activity Response Tolerated well  Mobility Referral Yes  $Mobility charge 1 Mobility  Mobility Specialist Start Time (ACUTE ONLY) 1345  Mobility Specialist Stop Time (ACUTE ONLY) 1355  Mobility Specialist Time Calculation (min) (ACUTE ONLY) 10 min   Pt received in chair, agreeable to mobility. ModI to stand and ambulate with no AD. Pt used IV pole for assistance during ambulation. Tolerated well, asx throughout. Returned pt to room, left in chair. All needs met.   Lawerance Bach Mobility Specialist Please contact via Special educational needs teacher or  Rehab office at [phone]

## 2023-06-27 NOTE — Assessment & Plan Note (Signed)
-   Holding metformin and glipizide - Sliding scale coverage - Monitor CBGs

## 2023-06-27 NOTE — Assessment & Plan Note (Signed)
-   Magnesium 1.4 - Possibly due to GI losses - Replace and recheck

## 2023-06-27 NOTE — Assessment & Plan Note (Signed)
Continue PPI ?

## 2023-06-27 NOTE — Hospital Course (Signed)
72 y.o. male with medical history significant of CHF, COPD, dementia with anxiety, diabetes mellitus type 2, hypertension, Wernicke's encephalopathy, and more presents the ED with a chief complaint of GI bleed.  Patient is extremely hard of hearing.  His wife provides most of the history.  She reports it started a couple days ago that he was having blood in his brief.  It has been painless.  On the day of presentation got really bad.  He had 4 episodes that filled the toilet with blood.  She reports has never had bleeding like this before.  His last colonoscopy was in 2020.  He had 15 polyps removed, was noted to have diverticulosis, noted to have internal hemorrhoids.  Patient denies any symptoms of anemia including but not limited to no lightheadedness, no chest pain, no paresthesias, no shortness of breath.  He is not on any blood thinner.  He has not complained of any pain at all.  He was FOBT positive in the ER.  No other complaints at this time.

## 2023-06-27 NOTE — Assessment & Plan Note (Signed)
-   GI aware of patient will see in the a.m. - Protonix twice daily - Hold pharmaceutical DVT prophylaxis - Trend CBC - Patient typed and screened - Continue to monitor

## 2023-06-28 ENCOUNTER — Encounter (HOSPITAL_COMMUNITY)
Admission: EM | Disposition: A | Discharge status: Home / Self Care | Payer: Self-pay | Source: Home / Self Care | Attending: Emergency Medicine

## 2023-06-28 ENCOUNTER — Observation Stay (HOSPITAL_COMMUNITY): Payer: Medicare HMO | Admitting: Anesthesiology

## 2023-06-28 ENCOUNTER — Encounter (HOSPITAL_COMMUNITY): Payer: Self-pay | Admitting: Family Medicine

## 2023-06-28 DIAGNOSIS — K2970 Gastritis, unspecified, without bleeding: Secondary | ICD-10-CM

## 2023-06-28 DIAGNOSIS — K921 Melena: Secondary | ICD-10-CM | POA: Diagnosis not present

## 2023-06-28 DIAGNOSIS — K648 Other hemorrhoids: Secondary | ICD-10-CM

## 2023-06-28 DIAGNOSIS — K644 Residual hemorrhoidal skin tags: Secondary | ICD-10-CM

## 2023-06-28 DIAGNOSIS — K5730 Diverticulosis of large intestine without perforation or abscess without bleeding: Secondary | ICD-10-CM

## 2023-06-28 DIAGNOSIS — K219 Gastro-esophageal reflux disease without esophagitis: Secondary | ICD-10-CM | POA: Diagnosis not present

## 2023-06-28 DIAGNOSIS — K449 Diaphragmatic hernia without obstruction or gangrene: Secondary | ICD-10-CM

## 2023-06-28 DIAGNOSIS — I509 Heart failure, unspecified: Secondary | ICD-10-CM

## 2023-06-28 DIAGNOSIS — K317 Polyp of stomach and duodenum: Secondary | ICD-10-CM

## 2023-06-28 DIAGNOSIS — K5791 Diverticulosis of intestine, part unspecified, without perforation or abscess with bleeding: Secondary | ICD-10-CM | POA: Diagnosis not present

## 2023-06-28 DIAGNOSIS — D509 Iron deficiency anemia, unspecified: Secondary | ICD-10-CM | POA: Diagnosis not present

## 2023-06-28 DIAGNOSIS — I110 Hypertensive heart disease with heart failure: Secondary | ICD-10-CM | POA: Diagnosis not present

## 2023-06-28 DIAGNOSIS — K641 Second degree hemorrhoids: Secondary | ICD-10-CM

## 2023-06-28 DIAGNOSIS — K319 Disease of stomach and duodenum, unspecified: Secondary | ICD-10-CM | POA: Diagnosis not present

## 2023-06-28 DIAGNOSIS — K3189 Other diseases of stomach and duodenum: Secondary | ICD-10-CM | POA: Diagnosis not present

## 2023-06-28 DIAGNOSIS — K2960 Other gastritis without bleeding: Secondary | ICD-10-CM | POA: Diagnosis not present

## 2023-06-28 HISTORY — PX: ESOPHAGOGASTRODUODENOSCOPY (EGD) WITH PROPOFOL: SHX5813

## 2023-06-28 HISTORY — PX: COLONOSCOPY WITH PROPOFOL: SHX5780

## 2023-06-28 HISTORY — PX: BIOPSY: SHX5522

## 2023-06-28 LAB — BASIC METABOLIC PANEL
Anion gap: 12 (ref 5–15)
BUN: 6 mg/dL — ABNORMAL LOW (ref 8–23)
CO2: 26 mmol/L (ref 22–32)
Calcium: 9.2 mg/dL (ref 8.9–10.3)
Chloride: 101 mmol/L (ref 98–111)
Creatinine, Ser: 0.93 mg/dL (ref 0.61–1.24)
GFR, Estimated: >60 mL/min (ref >60)
Glucose, Bld: 140 mg/dL — ABNORMAL HIGH (ref 70–99)
Potassium: 3.3 mmol/L — ABNORMAL LOW (ref 3.5–5.1)
Sodium: 139 mmol/L (ref 135–145)

## 2023-06-28 LAB — GLUCOSE, CAPILLARY
Glucose-Capillary: 147 mg/dL — ABNORMAL HIGH (ref 70–99)
Glucose-Capillary: 132 mg/dL — ABNORMAL HIGH (ref 70–99)
Glucose-Capillary: 109 mg/dL — ABNORMAL HIGH (ref 70–99)
Glucose-Capillary: 118 mg/dL — ABNORMAL HIGH (ref 70–99)
Glucose-Capillary: 145 mg/dL — ABNORMAL HIGH (ref 70–99)

## 2023-06-28 LAB — CBC
HCT: 35.9 % — ABNORMAL LOW (ref 39.0–52.0)
Hemoglobin: 11.6 g/dL — ABNORMAL LOW (ref 13.0–17.0)
MCH: 30.4 pg (ref 26.0–34.0)
MCHC: 32.3 g/dL (ref 30.0–36.0)
MCV: 94 fL (ref 80.0–100.0)
Platelets: 217 10*3/uL (ref 150–400)
RBC: 3.82 MIL/uL — ABNORMAL LOW (ref 4.22–5.81)
RDW: 15.3 % (ref 11.5–15.5)
WBC: 7.5 10*3/uL (ref 4.0–10.5)
nRBC: 0 % (ref 0.0–0.2)

## 2023-06-28 LAB — MAGNESIUM: Magnesium: 1.9 mg/dL (ref 1.7–2.4)

## 2023-06-28 SURGERY — COLONOSCOPY WITH PROPOFOL
Anesthesia: Monitor Anesthesia Care

## 2023-06-28 SURGERY — COLONOSCOPY WITH PROPOFOL
Anesthesia: General

## 2023-06-28 MED ORDER — LACTATED RINGERS IV SOLN: INTRAVENOUS | Status: DC

## 2023-06-28 MED ORDER — PROPOFOL 500 MG/50ML IV EMUL
INTRAVENOUS | Status: DC | PRN
  Administered 2023-06-28: 150 ug/kg/min via INTRAVENOUS

## 2023-06-28 MED ORDER — PROPOFOL 10 MG/ML IV BOLUS
INTRAVENOUS | Status: DC | PRN
  Administered 2023-06-28: 100 mg via INTRAVENOUS

## 2023-06-28 MED ORDER — LACTATED RINGERS IV SOLN
INTRAVENOUS | Status: DC | PRN
Start: 2023-06-28 — End: 2023-06-28

## 2023-06-28 MED ORDER — PHENYLEPHRINE HCL (PRESSORS) 10 MG/ML IV SOLN
INTRAVENOUS | Status: DC | PRN
Start: 2023-06-28 — End: 2023-06-28
  Administered 2023-06-28 (×2): 80 ug via INTRAVENOUS

## 2023-06-28 MED ORDER — SODIUM CHLORIDE 0.9 % IV SOLN: INTRAVENOUS | Status: DC

## 2023-06-28 MED ORDER — EPHEDRINE SULFATE (PRESSORS) 50 MG/ML IJ SOLN
INTRAMUSCULAR | Status: DC | PRN
Start: 2023-06-28 — End: 2023-06-28
  Administered 2023-06-28 (×2): 10 mg via INTRAVENOUS

## 2023-06-28 MED ORDER — LIDOCAINE HCL 1 % IJ SOLN
INTRAMUSCULAR | Status: DC | PRN
  Administered 2023-06-28: 60 mg via INTRADERMAL

## 2023-06-28 MED ORDER — POTASSIUM CHLORIDE CRYS ER 20 MEQ PO TBCR
40.0000 meq | EXTENDED_RELEASE_TABLET | Freq: Once | ORAL | Status: AC
  Administered 2023-06-28: 40 meq via ORAL
  Filled 2023-06-28 (×2): qty 2

## 2023-06-28 NOTE — Op Note (Signed)
[facility] Patient Name: Jeff Miles Procedure Date: 06/28/2023 1:38 PM MRN: [id number] Date of Birth: January 08, 1951 Attending MD: Sanjuan Dame , MD, [id number] CSN: [id number] Age: 72 Admit Type: Outpatient Procedure:                Upper GI endoscopy Indications:              Acute post hemorrhagic anemia, Iron deficiency                            anemia Providers:                Sanjuan Dame, MD, Buel Ream. Thomasena Edis RN, RN,                            Elinor Parkinson Referring MD:              Medicines:                Monitored Anesthesia Care Complications:            No immediate complications. Estimated Blood Loss:     Estimated blood loss was minimal. Procedure:                Pre-Anesthesia Assessment:                           - Prior to the procedure, a History and Physical                            was performed, and patient medications and                            allergies were reviewed. The patient's tolerance of                            previous anesthesia was also reviewed. The risks                            and benefits of the procedure and the sedation                            options and risks were discussed with the patient.                            All questions were answered, and informed consent                            was obtained. Prior Anticoagulants: The patient has                            taken no anticoagulant or antiplatelet agents                            except for aspirin. ASA Grade Assessment: III - A  patient with severe systemic disease. After                            reviewing the risks and benefits, the patient was                            deemed in satisfactory condition to undergo the                            procedure.                           After obtaining informed consent, the endoscope was                            passed under direct vision. Throughout the                             procedure, the patient's blood pressure, pulse, and                            oxygen saturations were monitored continuously. The                            GIF-H190 ([id number]) scope was introduced through the                            mouth, and advanced to the second part of duodenum.                            The upper GI endoscopy was accomplished without                            difficulty. The patient tolerated the procedure                            well. Scope In: 2:08:29 PM Scope Out: 2:15:02 PM Total Procedure Duration: 0 hours 6 minutes 33 seconds  Findings:      There is no endoscopic evidence of esophagitis or varices in the entire       esophagus.      Multiple 3 to 9 mm sessile polyps with no bleeding and no stigmata of       recent bleeding were found in the entire examined stomach. The polyp was       removed with a cold snare. Resection and retrieval were complete.      Mild inflammation characterized by nodularity was found in the stomach.       Biopsies were taken with a cold forceps for histology.      The duodenal bulb and second portion of the duodenum were normal. Impression:               - Multiple gastric polyps. Resected and                            retrieved.Single representative polyp                           -  Gastritis. Biopsied.                           - Normal duodenal bulb and second portion of the                            duodenum. Moderate Sedation:      Per Anesthesia Care Recommendation:           - Await pathology results.                           proceed with colonoscopy Procedure Code(s):        --- Professional ---                           [phone], Esophagogastroduodenoscopy, flexible,                            transoral; with removal of tumor(s), polyp(s), or                            other lesion(s) by snare technique                           [id number], 59, Esophagogastroduodenoscopy, flexible,                             transoral; with biopsy, single or multiple Diagnosis Code(s):        --- Professional ---                           K31.7, Polyp of stomach and duodenum                           K29.70, Gastritis, unspecified, without bleeding                           D62, Acute posthemorrhagic anemia                           D50.9, Iron deficiency anemia, unspecified CPT copyright 2022 American Medical Association. All rights reserved. The codes documented in this report are preliminary and upon coder review may  be revised to meet current compliance requirements. Sanjuan Dame, MD Sanjuan Dame, MD 06/28/2023 2:49:51 PM This report has been signed electronically. Number of Addenda: 0

## 2023-06-28 NOTE — Progress Notes (Addendum)
PROGRESS NOTE   Jeff Miles  WUJ:[id number] DOB: [birth year] DOA: 06/26/2023 PCP: Jeff Stabile, MD   Chief Complaint  Patient presents with   Rectal Bleeding   Level of care: Telemetry  Brief Admission History:   72 y.o. male with medical history significant of CHF, COPD, dementia with anxiety, diabetes mellitus type 2, hypertension, Wernicke's encephalopathy, and more presents the ED with a chief complaint of GI bleed.  Patient is extremely hard of hearing.  His wife provides most of the history.  She reports it started a couple days ago that he was having blood in his brief.  It has been painless.  On the day of presentation got really bad.  He had 4 episodes that filled the toilet with blood.  She reports has never had bleeding like this before.  His last colonoscopy was in 2020.  He had 15 polyps removed, was noted to have diverticulosis, noted to have internal hemorrhoids.  Patient denies any symptoms of anemia including but not limited to no lightheadedness, no chest pain, no paresthesias, no shortness of breath.  He is not on any blood thinner.  He has not complained of any pain at all.  He was FOBT positive in the ER.  No other complaints at this time.     Assessment and Plan:  Working diagnosis of diverticular bleed / Lower GI bleed  - GI consult appreciated with plan for colonoscopy and possible EGD if indeterminate. - Protonix twice daily - Hold pharmaceutical DVT prophylaxis - Trend CBC  - Patient typed and screened - GI prep per GI service as ordered and colonoscopy planned for 10/15  - further recommendations to follow   Hypomagnesemia - Magnesium 1.4 repleted to 1.9 - due to GI losses  Hypokalemia - repleted   GERD (gastroesophageal reflux disease) - Continue PPI  Anxiety - Continue Remeron, Risperdal, Ativan  Diabetes mellitus type 2 in nonobese  - Holding metformin and glipizide - Sliding scale coverage - Monitor CBGs CBG (last 3)  Recent Labs     06/27/23 2114 06/28/23 1001 06/28/23 1102  GLUCAP 125* 147* 132*   COPD (chronic obstructive pulmonary disease) - Continue Trelegy (formulary equivalent)  DVT prophylaxis: SCD Code Status: Full  Family Communication: updated at bedside  Disposition: anticipate DC home in 1-2 days   Consultants:  GI  Procedures:  Colonoscopy pending   Antimicrobials:    Subjective: Pt tolerated GI prep for colonoscopy today.    Objective: Vitals:   06/28/23 0452 06/28/23 0642 06/28/23 0718 06/28/23 0953  BP: (!) 186/73 (!) 182/85  (!) 163/96  Pulse: (!) 102 92  92  Resp: 20   19  Temp: 97.6 F (36.4 C)   98 F (36.7 C)  TempSrc:    Oral  SpO2: 95%  96% 99%  Weight:      Height:        Intake/Output Summary (Last 24 hours) at 06/28/2023 1248 Last data filed at 06/28/2023 0300 Gross per 24 hour  Intake 480 ml  Output --  Net 480 ml   Filed Weights   06/26/23 1615  Weight: 108.9 kg   Examination:  General exam: very hard of hearing. Appears calm and comfortable  Respiratory system: Clear to auscultation. Respiratory effort normal. Cardiovascular system: normal S1 & S2 heard. No JVD, murmurs, rubs, gallops or clicks. No pedal edema. Gastrointestinal system: Abdomen is nondistended, soft and nontender. No organomegaly or masses felt. Normal bowel sounds heard. Central nervous system: Alert and oriented.  No focal neurological deficits. Extremities: Symmetric 5 x 5 power. Skin: No rashes, lesions or ulcers. Psychiatry: Judgement and insight appear normal. Mood & affect appropriate.   Data Reviewed: I have personally reviewed following labs and imaging studies  CBC: Recent Labs  Lab 06/26/23 1632 06/27/23 0028 06/27/23 0344 06/28/23 0409  WBC 7.2 7.8 6.3 7.5  NEUTROABS 5.2  --   --   --   HGB 11.9* 10.7* 10.7* 11.6*  HCT 37.8* 33.6* 34.4* 35.9*  MCV 94.3 93.9 95.3 94.0  PLT 234 212 214 217    Basic Metabolic Panel: Recent Labs  Lab 06/26/23 1632 06/27/23 0344  06/28/23 0409  NA 139 141 139  K 4.0 3.3* 3.3*  CL 101 103 101  CO2 26 29 26   GLUCOSE 162* 115* 140*  BUN 12 12 6*  CREATININE 1.19 1.08 0.93  CALCIUM 9.6 9.2 9.2  MG  --  1.4* 1.9    CBG: Recent Labs  Lab 06/27/23 1118 06/27/23 1627 06/27/23 2114 06/28/23 1001 06/28/23 1102  GLUCAP 117* 118* 125* 147* 132*    No results found for this or any previous visit (from the past 240 hour(s)).   Radiology Studies: No results found.  Scheduled Meds:  [MAR Hold] atorvastatin  80 mg Oral Daily   [MAR Hold] fluticasone furoate-vilanterol  1 puff Inhalation Daily   And   [MAR Hold] umeclidinium bromide  1 puff Inhalation Daily   [MAR Hold] insulin aspart  0-15 Units Subcutaneous TID WC   [MAR Hold] insulin aspart  0-5 Units Subcutaneous QHS   [MAR Hold] ketoconazole   Topical BID   [MAR Hold] lisinopril  10 mg Oral Daily   [MAR Hold] LORazepam  1 mg Oral Daily   [MAR Hold] mirtazapine  15 mg Oral QHS   [MAR Hold] pantoprazole (PROTONIX) IV  40 mg Intravenous Q12H   [MAR Hold] risperiDONE  1 mg Oral QHS   Continuous Infusions:   LOS: 0 days   Time spent: 47 mins  Jeff Graef Laural Benes, MD How to contact the [facility] Attending or Consulting provider 7A - 7P or covering provider during after hours 7P -7A, for this patient?  Check the care team in [facility] and look for a) attending/consulting TR[facility] provider listed and b) the [facility] team listed Log into [email or web address] to find provider on call.  Locate the [facility] provider you are looking for under [facility] and page to a number that you can be directly reached. If you still have difficulty reaching the provider, please page the [facility] (Director on Call) for the Hospitalists listed on amion for assistance.  06/28/2023, 12:48 PM

## 2023-06-28 NOTE — Op Note (Signed)
[name] Patient Name: Jeff Miles Procedure Date: 06/28/2023 1:37 PM MRN: [id number] Date of Birth: 1951/04/03 Attending MD: Sanjuan Dame , MD, [id number] CSN: [id number] Age: 72 Admit Type: Inpatient Procedure:                Colonoscopy Indications:              Hematochezia Providers:                Sanjuan Dame, MD, Buel Ream. Thomasena Edis RN, RN,                            Elinor Parkinson Referring MD:              Medicines:                Monitored Anesthesia Care Complications:            No immediate complications. Estimated Blood Loss:     Estimated blood loss: none. Procedure:                Pre-Anesthesia Assessment:                           - Prior to the procedure, a History and Physical                            was performed, and patient medications and                            allergies were reviewed. The patient's tolerance of                            previous anesthesia was also reviewed. The risks                            and benefits of the procedure and the sedation                            options and risks were discussed with the patient.                            All questions were answered, and informed consent                            was obtained. Prior Anticoagulants: The patient has                            taken no anticoagulant or antiplatelet agents                            except for aspirin. ASA Grade Assessment: III - A                            patient with severe systemic disease. After                            reviewing the  risks and benefits, the patient was                            deemed in satisfactory condition to undergo the                            procedure.                           After obtaining informed consent, the colonoscope                            was passed under direct vision. Throughout the                            procedure, the patient's blood pressure, pulse, and                             oxygen saturations were monitored continuously. The                            [id number]-HQ190L ([id number]) scope was introduced through                            the anus and advanced to the the cecum, identified                            by appendiceal orifice and ileocecal valve. The                            colonoscopy was performed without difficulty. The                            patient tolerated the procedure well. The quality                            of the bowel preparation was adequate to identify                            polyps greater than 5 mm in size and good except                            the ascending colon was unsatisfactory and the                            cecum was unsatisfactory. Scope In: 2:21:52 PM Scope Out: 2:49:24 PM Scope Withdrawal Time: 0 hours 21 minutes 58 seconds  Total Procedure Duration: 0 hours 27 minutes 32 seconds  Findings:      Copious quantities of stool was found in the ascending colon and in the       cecum, precluding visualization. Lavage of the area was performed using       copious amounts of sterile water, resulting in incomplete clearance with       fair visualization.  Scattered diverticula were found in the left colon.      A tattoo was seen in the sigmoid colon and at the hepatic flexure. A       post-polypectomy scar was found at the tattoo site. There was no       evidence of residual polyp tissue.      Non-bleeding external and internal hemorrhoids were found during       retroflexion. The hemorrhoids were medium-sized. Impression:               - Stool in the ascending colon and in the cecum.                           - Diverticulosis in the left colon.                           - A tattoo was seen in the sigmoid colon and at the                            hepatic flexure. A post-polypectomy scar was found                            at the tattoo site. There was no evidence of                            residual  polyp tissue.                           - Non-bleeding external and internal hemorrhoids.                           - No specimens collected.                           - No evidence of old or active bleeding throughout                            the examined colon                           - No large masses were seen on the examined colon                           - Likely cause of hematochezia is hemorrhoidal bleed Moderate Sedation:      Per Anesthesia Care Recommendation:           - Repeat colonoscopy within 3 months as outpatient                            because the bowel preparation was fair in the cecum                            and ascending colon                           - Consider IV iron loading while inpatient .                           -  Restart diet                           - Discharge on PO iron q48 hours                           -Patient to follow up in GI clinic upon discharge                            and if inadequate repsonse to iron supplementation                            , may need to consider capsule small bowel                            endoscopy as outpatient Procedure Code(s):        --- Professional ---                           [phone], Colonoscopy, flexible; diagnostic, including                            collection of specimen(s) by brushing or washing,                            when performed (separate procedure) Diagnosis Code(s):        --- Professional ---                           K64.8, Other hemorrhoids                           K92.1, Melena (includes Hematochezia)                           K57.30, Diverticulosis of large intestine without                            perforation or abscess without bleeding CPT copyright 2022 American [employer] Association. All rights reserved. The codes documented in this report are preliminary and upon coder review may  be revised to meet current compliance requirements. Sanjuan Dame, MD Sanjuan Dame,  MD 06/28/2023 2:59:04 PM This report has been signed electronically. Number of Addenda: 0

## 2023-06-28 NOTE — Plan of Care (Signed)

## 2023-06-28 NOTE — Transfer of Care (Signed)
Immediate Anesthesia Transfer of Care Note  Patient: Jeff Miles  Procedure(s) Performed: COLONOSCOPY WITH PROPOFOL ESOPHAGOGASTRODUODENOSCOPY (EGD) WITH PROPOFOL BIOPSY  Patient Location: PACU  Anesthesia Type:General  Level of Consciousness: awake  Airway & Oxygen Therapy: Patient Spontanous Breathing  Post-op Assessment: Report given to RN  Post vital signs: Reviewed and stable  Last Vitals:  Vitals Value Taken Time  BP 116/81 06/28/23 1501  Temp    Pulse 72 06/28/23 1503  Resp 20 06/28/23 1503  SpO2 94 % 06/28/23 1503  Vitals shown include unfiled device data.  Last Pain:  Vitals:   06/28/23 1358  TempSrc:   PainSc: 0-No pain         Complications: No notable events documented.

## 2023-06-28 NOTE — Plan of Care (Signed)
  Problem: Education: Goal: Knowledge of General Education information will improve Description Including pain rating scale, medication(s)/side effects and non-pharmacologic comfort measures Outcome: Progressing   

## 2023-06-28 NOTE — Anesthesia Preprocedure Evaluation (Signed)
Anesthesia Evaluation  Patient identified by MRN, date of birth, ID band Patient awake    Reviewed: Allergy & Precautions, H&P , NPO status , Patient's Chart, lab work & pertinent test results, reviewed documented beta blocker date and time   Airway Mallampati: III  TM Distance: >3 FB Neck ROM: full  Mouth opening: Limited Mouth Opening  Dental no notable dental hx. (+) Edentulous Upper, Edentulous Lower   Pulmonary neg pulmonary ROS, COPD, former smoker   Pulmonary exam normal breath sounds clear to auscultation       Cardiovascular Exercise Tolerance: Good hypertension, +CHF  negative cardio ROS  Rhythm:regular Rate:Normal     Neuro/Psych  PSYCHIATRIC DISORDERS Anxiety    Dementia negative neurological ROS  negative psych ROS   GI/Hepatic negative GI ROS, Neg liver ROS,GERD  ,,  Endo/Other  negative endocrine ROSdiabetes    Renal/GU negative Renal ROS  negative genitourinary   Musculoskeletal   Abdominal   Peds  Hematology negative hematology ROS (+) Blood dyscrasia, anemia   Anesthesia Other Findings   Reproductive/Obstetrics negative OB ROS                             Anesthesia Physical Anesthesia Plan  ASA: 4 and emergent  Anesthesia Plan: General   Post-op Pain Management:    Induction:   PONV Risk Score and Plan: Propofol infusion  Airway Management Planned:   Additional Equipment:   Intra-op Plan:   Post-operative Plan:   Informed Consent: I have reviewed the patients History and Physical, chart, labs and discussed the procedure including the risks, benefits and alternatives for the proposed anesthesia with the patient or authorized representative who has indicated his/her understanding and acceptance.     Dental Advisory Given  Plan Discussed with: CRNA  Anesthesia Plan Comments:        Anesthesia Quick Evaluation

## 2023-06-28 NOTE — Progress Notes (Signed)
Patient completed bowel prep. Patient completed one of two tap water enemas. Patient refused second enema. Stool is clear/ light brown. Dr. Levon Hedger made aware.

## 2023-06-28 NOTE — Progress Notes (Signed)
Patient underwent EGD and Colonoscopy under propofol sedation.  Tolerated the procedure adequately.   FINDINGS:  Upper endoscopy  - Multiple gastric polyps. Resected and retrieved. Single representative polyp  - Gastritis. Biopsied. -Normal duodenal bulb and second portion of the duodenum.  Colonoscopy :  - Stool in the ascending colon and in the cecum.  - Diverticulosis in the left colon.  - A tattoo was seen in the sigmoid colon and at the hepatic flexure. A post- polypectomy scar was found at the tattoo site. There was no evidence of residual polyp tissue.  - Non- bleeding external and internal hemorrhoids.  - No specimens collected.  - No evidence of old or active bleeding throughout the examined colon  - No large masses were seen on the examined colon  - Likely cause of hematochezia is hemorrhoidal bleed  RECOMMENDATIONS  - Await pathology results of gastric biopsy and polypectomy  - Repeat colonoscopy within 3 months as outpatient because the bowel preparation was fair in the cecum and ascending colon  - Consider IV iron loading while inpatient .  - Restart diet  - Discharge on PO iron q48 hours  - Patient to follow up in GI clinic upon discharge and if inadequate repsonse to iron supplementation , may need to consider capsule small bowel endoscopy as outpatient  Please recall GI with any further questions.  Vista Lawman, MD Gastroenterology and Hepatology [facility]

## 2023-06-28 NOTE — H&P (Signed)
We will proceed with EGD/Colonoscopy as scheduled.    I thoroughly discussed with the patient the procedure, including the risks involved. Patient understands what the procedure involves including the benefits and any risks. Patient understands alternatives to the proposed procedure. Risks including (but not limited to) bleeding, tearing of the lining (perforation), rupture of adjacent organs, problems with heart and lung function, infection, and medication reactions. A small percentage of complications may require surgery, hospitalization, repeat endoscopic procedure, and/or transfusion.  Patient understood and agreed.

## 2023-06-29 DIAGNOSIS — K317 Polyp of stomach and duodenum: Secondary | ICD-10-CM | POA: Diagnosis not present

## 2023-06-29 DIAGNOSIS — K319 Disease of stomach and duodenum, unspecified: Secondary | ICD-10-CM | POA: Diagnosis not present

## 2023-06-29 DIAGNOSIS — K5730 Diverticulosis of large intestine without perforation or abscess without bleeding: Secondary | ICD-10-CM | POA: Diagnosis not present

## 2023-06-29 DIAGNOSIS — K644 Residual hemorrhoidal skin tags: Secondary | ICD-10-CM | POA: Diagnosis not present

## 2023-06-29 DIAGNOSIS — K648 Other hemorrhoids: Secondary | ICD-10-CM | POA: Diagnosis not present

## 2023-06-29 LAB — BASIC METABOLIC PANEL
Anion gap: 10 (ref 5–15)
BUN: 7 mg/dL — ABNORMAL LOW (ref 8–23)
CO2: 24 mmol/L (ref 22–32)
Calcium: 9.3 mg/dL (ref 8.9–10.3)
Chloride: 106 mmol/L (ref 98–111)
Creatinine, Ser: 1.06 mg/dL (ref 0.61–1.24)
GFR, Estimated: >60 mL/min (ref >60)
Glucose, Bld: 144 mg/dL — ABNORMAL HIGH (ref 70–99)
Potassium: 3.9 mmol/L (ref 3.5–5.1)
Sodium: 140 mmol/L (ref 135–145)

## 2023-06-29 LAB — CBC
HCT: 34.8 % — ABNORMAL LOW (ref 39.0–52.0)
Hemoglobin: 11.3 g/dL — ABNORMAL LOW (ref 13.0–17.0)
MCH: 30.5 pg (ref 26.0–34.0)
MCHC: 32.5 g/dL (ref 30.0–36.0)
MCV: 93.8 fL (ref 80.0–100.0)
Platelets: 214 10*3/uL (ref 150–400)
RBC: 3.71 MIL/uL — ABNORMAL LOW (ref 4.22–5.81)
RDW: 15.1 % (ref 11.5–15.5)
WBC: 6.8 10*3/uL (ref 4.0–10.5)
nRBC: 0 % (ref 0.0–0.2)

## 2023-06-29 LAB — GLUCOSE, CAPILLARY
Glucose-Capillary: 119 mg/dL — ABNORMAL HIGH (ref 70–99)
Glucose-Capillary: 136 mg/dL — ABNORMAL HIGH (ref 70–99)

## 2023-06-29 MED ORDER — HYDROCORTISONE ACETATE 25 MG RE SUPP
25.0000 mg | Freq: Two times a day (BID) | RECTAL | 1 refills | Status: DC
Start: 2023-06-29 — End: 2023-08-09

## 2023-06-29 NOTE — Discharge Instructions (Signed)
1)Avoid ibuprofen/Advil/Aleve/Motrin/Goody Powders/Naproxen/BC powders/Meloxicam/Diclofenac/Indomethacin and other Nonsteroidal anti-inflammatory medications as these will make you more likely to bleed and can cause stomach ulcers, can also cause Kidney problems.   2) follow-up with PCP within the week for recheck and reevaluation and repeat CBC and BMP blood test

## 2023-06-29 NOTE — Progress Notes (Signed)
Patient discharged home with instructions given on discharge medications and follow up visits,patient and family verbalized understanding.Prescriptions sent to  Pharmacy of choice documented on AVS. IV discontinued,catheter intact. Accompanied by staff to an awaiting vehicle.

## 2023-06-29 NOTE — Discharge Summary (Signed)
Jeff Miles, is a 72 y.o. male  DOB Jan 26, 1951  MRN [id number].  Admission date:  06/26/2023  Admitting Physician  Lilyan Gilford, DO  Discharge Date:  06/29/2023   Primary MD  Benita Stabile, MD  Recommendations for primary care physician for things to follow:   1)Avoid ibuprofen/Advil/Aleve/Motrin/Goody Powders/Naproxen/BC powders/Meloxicam/Diclofenac/Indomethacin and other Nonsteroidal anti-inflammatory medications as these will make you more likely to bleed and can cause stomach ulcers, can also cause Kidney problems.   2) follow-up with PCP within the week for recheck and reevaluation and repeat CBC and BMP blood test  Admission Diagnosis  Rectal bleeding [K62.5] GI bleed [K92.2]   Discharge Diagnosis  Rectal bleeding [K62.5] GI bleed [K92.2]  ***  Principal Problem:   GI bleed Active Problems:   COPD (chronic obstructive pulmonary disease) (HCC)   Diabetes mellitus type 2 in nonobese (HCC)   Anxiety   GERD (gastroesophageal reflux disease)   Hypomagnesemia   Hematochezia   Gastric polyp   Gastritis and gastroduodenitis   Grade II hemorrhoids      Past Medical History:  Diagnosis Date   CHF (congestive heart failure) (HCC)    COPD (chronic obstructive pulmonary disease) (HCC)    Dementia (HCC)    alcohol related   Diabetes (HCC)    History of kidney stones    Hypertension    Unsteady gait    Wernicke's encephalopathy     Past Surgical History:  Procedure Laterality Date   BACK SURGERY     two   COLONOSCOPY WITH PROPOFOL N/A 11/28/2018   Procedure: COLONOSCOPY WITH PROPOFOL;  Surgeon: West Bali, MD;  [facility];  Service: Endoscopy;  Laterality: N/A;  7:30am   INNER EAR SURGERY     six surgery   MASS EXCISION N/A 10/08/2019   Procedure: EXCISION MASS ON TONGUE;  Surgeon: Newman Pies, MD;  Location: [facility];  Service: ENT;   Laterality: N/A;   NOSE SURGERY     x2   POLYPECTOMY  11/28/2018   Procedure: POLYPECTOMY;  Surgeon: West Bali, MD;  [facility];  Service: Endoscopy;;  cecal polyp, ascending polyps x4, polyp at hepatic flexure, descending colon polyps x4, rectal polyps x4       HPI  from the history and physical done on the day of admission:     ***  ****     Hospital Course:      72 y.o. male with medical history significant of CHF, COPD, dementia with anxiety, diabetes mellitus type 2, hypertension, Wernicke's encephalopathy, and more presents the ED with a chief complaint of GI bleed.  Patient is extremely hard of hearing.  His wife provides most of the history.  She reports it started a couple days ago that he was having blood in his brief.  It has been painless.  On the day of presentation got really bad.  He had 4 episodes that filled the toilet with blood.  She reports has never had bleeding like  this before.  His last colonoscopy was in 2020.  He had 15 polyps removed, was noted to have diverticulosis, noted to have internal hemorrhoids.  Patient denies any symptoms of anemia including but not limited to no lightheadedness, no chest pain, no paresthesias, no shortness of breath.  He is not on any blood thinner.  He has not complained of any pain at all.  He was FOBT positive in the ER.  No other complaints at this time.    ***** Assessment and Plan: * GI bleed - GI aware of patient will see in the a.m. - Protonix twice daily - Hold pharmaceutical DVT prophylaxis - Trend CBC - Patient typed and screened - Continue to monitor  Hypomagnesemia - Magnesium 1.4 - Possibly due to GI losses - Replace and recheck  GERD (gastroesophageal reflux disease) - Continue PPI  Anxiety - Continue Remeron, Risperdal, Ativan  Diabetes mellitus type 2 in nonobese (HCC) - Holding metformin and glipizide - Sliding scale coverage - Monitor CBGs  COPD (chronic obstructive pulmonary  disease) (HCC) - Continue Trelegy        Discharge Condition: ***  Follow UP   Follow-up Information     Benita Stabile, MD. Schedule an appointment as soon as possible for a visit in 1 week(s).   Specialty: Internal Medicine Why: Repeat CBC and BMP within 1 week Contact information: [address] [place] [address] [phone]                  Consults obtained - ***  Diet and Activity recommendation:  As advised  Discharge Instructions    **** Discharge Instructions     Call MD for:  difficulty breathing, headache or visual disturbances   Complete by: As directed    Call MD for:  persistant dizziness or light-headedness   Complete by: As directed    Call MD for:  persistant nausea and vomiting   Complete by: As directed    Call MD for:  temperature >100.4   Complete by: As directed    Diet - low sodium heart healthy   Complete by: As directed    Discharge instructions   Complete by: As directed    1)Avoid ibuprofen/Advil/Aleve/Motrin/Goody Powders/Naproxen/BC powders/Meloxicam/Diclofenac/Indomethacin and other Nonsteroidal anti-inflammatory medications as these will make you more likely to bleed and can cause stomach ulcers, can also cause Kidney problems.   2) follow-up with PCP within the week for recheck and reevaluation and repeat CBC and BMP blood test   Increase activity slowly   Complete by: As directed          Discharge Medications     Allergies as of 06/29/2023   No Known Allergies      Medication List     STOP taking these medications    pravastatin 20 MG tablet Commonly known as: PRAVACHOL       TAKE these medications    albuterol (2.5 MG/3ML) 0.083% nebulizer solution Commonly known as: PROVENTIL Take 2.5 mg by nebulization 3 (three) times daily.   PROAIR HFA IN Inhale 2 puffs into the lungs every 6 (six) hours as needed (shortness of breath). Every 4-6 hou   atorvastatin 80 MG tablet Commonly known as:  LIPITOR Take 80 mg by mouth daily.   cyanocobalamin 1000 MCG tablet Take 1 tablet (1,000 mcg total) by mouth daily.   Fish Oil 1000 MG Caps Take 1,000 mg by mouth daily.   furosemide 40 MG tablet Commonly known as: LASIX Take  1 tablet (40 mg total) by mouth daily.   Fusion Plus Caps Take 1 capsule by mouth daily.   glipiZIDE 5 MG 24 hr tablet Commonly known as: GLUCOTROL XL Take 5 mg by mouth 2 (two) times daily.   hydrocortisone 25 MG suppository Commonly known as: ANUSOL-HC Place 1 suppository (25 mg total) rectally every 12 (twelve) hours. For hemorrhoids   lisinopril 10 MG tablet Commonly known as: ZESTRIL Take 10 mg by mouth daily.   loratadine 10 MG tablet Commonly known as: CLARITIN Take 10 mg by mouth daily.   LORazepam 1 MG tablet Commonly known as: ATIVAN Take 1 mg by mouth daily.   metFORMIN 500 MG tablet Commonly known as: GLUCOPHAGE Take 500 mg by mouth 2 (two) times daily with a meal.   mirtazapine 15 MG tablet Commonly known as: REMERON Take 15 mg by mouth at bedtime.   multivitamin with minerals Tabs tablet Take 1 tablet by mouth daily.   naltrexone 50 MG tablet Commonly known as: DEPADE Take 50 mg by mouth daily.   Omeprazole 20 MG Tbdd Take 20 mg by mouth daily.   pioglitazone 30 MG tablet Commonly known as: ACTOS Take 30 mg by mouth daily.   risperiDONE 0.5 MG tablet Commonly known as: RISPERDAL Take 0.5 mg by mouth at bedtime.   SOOTHE XP OP Apply 1 drop to eye 2 (two) times daily.   Trelegy Ellipta 100-62.5-25 MCG/INH Aepb Generic drug: Fluticasone-Umeclidin-Vilant Inhale into the lungs.   vitamin B-1 250 MG tablet Take 250 mg by mouth daily.       Major procedures and Radiology Reports - PLEASE review detailed and final reports for all details, in brief -   Today   Subjective    Jeff Miles today has no ***          Patient has been seen and examined prior to discharge   Objective   Blood pressure (!)  160/77, pulse 89, temperature 98.8 F (37.1 C), temperature source Oral, resp. rate 19, height 5\' 8"  (1.727 m), weight 108.8 kg, SpO2 96%.   Intake/Output Summary (Last 24 hours) at 06/29/2023 1501 Last data filed at 06/29/2023 0500 Gross per 24 hour  Intake 360 ml  Output --  Net 360 ml    Exam Gen:- Awake Alert, no acute distress *** HEENT:- [place].AT, No sclera icterus Neck-Supple Neck,No JVD,.  Lungs-  CTAB , good air movement bilaterally CV- S1, S2 normal, regular Abd-  +ve B.Sounds, Abd Soft, No tenderness,    Extremity/Skin:- No  edema,   good pulses Psych-affect is appropriate, oriented x3 Neuro-no new focal deficits, no tremors ***   Data Review   CBC w Diff:  Lab Results  Component Value Date   WBC 6.8 06/29/2023   HGB 11.3 (L) 06/29/2023   HCT 34.8 (L) 06/29/2023   PLT 214 06/29/2023   LYMPHOPCT 17 06/26/2023   MONOPCT 7 06/26/2023   EOSPCT 2 06/26/2023   BASOPCT 1 06/26/2023   CMP:  Lab Results  Component Value Date   NA 140 06/29/2023   K 3.9 06/29/2023   CL 106 06/29/2023   CO2 24 06/29/2023   BUN 7 (L) 06/29/2023   CREATININE 1.06 06/29/2023   PROT 6.4 (L) 06/27/2023   ALBUMIN 3.5 06/27/2023   BILITOT 0.5 06/27/2023   ALKPHOS 66 06/27/2023   AST 22 06/27/2023   ALT 17 06/27/2023  . Total Discharge time is about 33 minutes  Shon Hale M.D on 06/29/2023 at 3:01 PM  Go to [email or web address] -  for contact info  [facility] - Office  [phone]

## 2023-06-29 NOTE — Plan of Care (Signed)

## 2023-06-30 LAB — SURGICAL PATHOLOGY

## 2023-07-03 NOTE — Anesthesia Postprocedure Evaluation (Signed)
Anesthesia Post Note  Patient: Jeff Miles  Procedure(s) Performed: COLONOSCOPY WITH PROPOFOL ESOPHAGOGASTRODUODENOSCOPY (EGD) WITH PROPOFOL BIOPSY  Patient location during evaluation: Phase II Anesthesia Type: General Level of consciousness: awake Pain management: pain level controlled Vital Signs Assessment: post-procedure vital signs reviewed and stable Respiratory status: spontaneous breathing and respiratory function stable Cardiovascular status: blood pressure returned to baseline and stable Postop Assessment: no headache and no apparent nausea or vomiting Anesthetic complications: no Comments: Late entry   No notable events documented.   Last Vitals:  Vitals:   06/29/23 2117 06/30/23 0530  BP: (!) 165/85 (!) 147/75  Pulse: 79 85  Resp: 20 18  Temp: 37.2 C 36.8 C  SpO2: 100% 92%    Last Pain:  Vitals:   06/29/23 1340  TempSrc: Oral  PainSc:                  Windell Norfolk

## 2023-07-04 DIAGNOSIS — E1122 Type 2 diabetes mellitus with diabetic chronic kidney disease: Secondary | ICD-10-CM | POA: Diagnosis not present

## 2023-07-04 DIAGNOSIS — E782 Mixed hyperlipidemia: Secondary | ICD-10-CM | POA: Diagnosis not present

## 2023-07-04 DIAGNOSIS — D509 Iron deficiency anemia, unspecified: Secondary | ICD-10-CM | POA: Diagnosis not present

## 2023-07-04 NOTE — Progress Notes (Signed)
I reviewed the pathology results. Ann, can you send her a letter with the findings as described below please?  Repeat Colonoscopy in 3 months   Thanks,  Vista Lawman, MD Gastroenterology and Hepatology [facility]  ---------------------------------------------------------------------------------------------  [facility] [address], [address], [place], [place] [address] Phone:  [phone]   07/04/23 Jeff Miles, [place]   Dear [name],  I am writing to inform you that the biopsies taken during your recent endoscopic examination showed:  No H. Pylori bacteria in stomach , or any early cancer changes to the stomach mucosa ( Intestinal metaplasia)  Also the polyp removed from the stomach is benign and nothing to worried about  Although the colonoscopy preparation was poor as there was stool hindering good visualization and exam of the colon.  Because of this I recommend to have repeat colonoscopy in 3 months with extended bowel prep  Please call us at [phone] if you have persistent problems or have questions about your condition that have not been fully answered at this time.  Sincerely,  Vista Lawman, MD Gastroenterology and Hepatology

## 2023-07-05 ENCOUNTER — Encounter (HOSPITAL_COMMUNITY): Payer: Self-pay | Admitting: Gastroenterology

## 2023-07-05 ENCOUNTER — Encounter (INDEPENDENT_AMBULATORY_CARE_PROVIDER_SITE_OTHER): Payer: Self-pay | Admitting: *Deleted

## 2023-07-08 DIAGNOSIS — K922 Gastrointestinal hemorrhage, unspecified: Secondary | ICD-10-CM | POA: Diagnosis not present

## 2023-07-08 DIAGNOSIS — E782 Mixed hyperlipidemia: Secondary | ICD-10-CM | POA: Diagnosis not present

## 2023-07-08 DIAGNOSIS — N182 Chronic kidney disease, stage 2 (mild): Secondary | ICD-10-CM | POA: Diagnosis not present

## 2023-07-08 DIAGNOSIS — J449 Chronic obstructive pulmonary disease, unspecified: Secondary | ICD-10-CM | POA: Diagnosis not present

## 2023-07-08 DIAGNOSIS — R801 Persistent proteinuria, unspecified: Secondary | ICD-10-CM | POA: Diagnosis not present

## 2023-07-08 DIAGNOSIS — K649 Unspecified hemorrhoids: Secondary | ICD-10-CM | POA: Diagnosis not present

## 2023-07-08 DIAGNOSIS — F0390 Unspecified dementia without behavioral disturbance: Secondary | ICD-10-CM | POA: Diagnosis not present

## 2023-07-08 DIAGNOSIS — R600 Localized edema: Secondary | ICD-10-CM | POA: Diagnosis not present

## 2023-07-08 DIAGNOSIS — E1122 Type 2 diabetes mellitus with diabetic chronic kidney disease: Secondary | ICD-10-CM | POA: Diagnosis not present

## 2023-08-09 ENCOUNTER — Encounter: Payer: Self-pay | Admitting: Internal Medicine

## 2023-08-09 ENCOUNTER — Ambulatory Visit: Payer: Medicare HMO | Admitting: Internal Medicine

## 2023-08-09 VITALS — BP 125/81 | HR 92 | Temp 98.8°F | Ht 69.0 in | Wt 229.0 lb

## 2023-08-09 DIAGNOSIS — Z860101 Personal history of adenomatous and serrated colon polyps: Secondary | ICD-10-CM

## 2023-08-09 DIAGNOSIS — K921 Melena: Secondary | ICD-10-CM | POA: Diagnosis not present

## 2023-08-09 DIAGNOSIS — K5791 Diverticulosis of intestine, part unspecified, without perforation or abscess with bleeding: Secondary | ICD-10-CM

## 2023-08-09 LAB — CBC WITH DIFFERENTIAL/PLATELET
Absolute Lymphocytes: 1323 {cells}/uL (ref 850–3900)
Absolute Monocytes: 549 {cells}/uL (ref 200–950)
Basophils Absolute: 27 {cells}/uL (ref 0–200)
Basophils Relative: 0.3 %
Eosinophils Absolute: 126 {cells}/uL (ref 15–500)
Eosinophils Relative: 1.4 %
HCT: 33.2 % — ABNORMAL LOW (ref 38.5–50.0)
Hemoglobin: 10.8 g/dL — ABNORMAL LOW (ref 13.2–17.1)
MCH: 29 pg (ref 27.0–33.0)
MCHC: 32.5 g/dL (ref 32.0–36.0)
MCV: 89.2 fL (ref 80.0–100.0)
MPV: 10.9 fL (ref 7.5–12.5)
Monocytes Relative: 6.1 %
Neutro Abs: 6975 {cells}/uL (ref 1500–7800)
Neutrophils Relative %: 77.5 %
Platelets: 338 10*3/uL (ref 140–400)
RBC: 3.72 10*6/uL — ABNORMAL LOW (ref 4.20–5.80)
RDW: 13 % (ref 11.0–15.0)
Total Lymphocyte: 14.7 %
WBC: 9 10*3/uL (ref 3.8–10.8)

## 2023-08-09 NOTE — Progress Notes (Signed)
Primary Care Physician:  Benita Stabile, MD Primary Gastroenterologist:  Dr. Jena Gauss  Pre-Procedure History & Physical: HPI:  Jeff Miles is a 72 y.o. male significant hearing loss hospitalized back in October with hematochezia.  EGD and colonoscopy (Dr. Tasia Catchings) demonstrated essentially negative; colonoscopy poor prep.  History of high-grade adenomas removed by Dr. Darrick Penna 2020; hemoglobin was stable during his acute illness in the 11 range.  No transfusion.  No follow-up hemoglobin.  Recommendations included returning for an early interval colonoscopy in the setting of an adequate preparation.  Patient continues to experience paper hematochezia but no melena and no gross hematochezia.     Past Medical History:  Diagnosis Date   CHF (congestive heart failure) (HCC)    COPD (chronic obstructive pulmonary disease) (HCC)    Dementia (HCC)    alcohol related   Diabetes (HCC)    History of kidney stones    Hypertension    Unsteady gait    Wernicke's encephalopathy     Past Surgical History:  Procedure Laterality Date   BACK SURGERY     two   BIOPSY  06/28/2023   Procedure: BIOPSY;  Surgeon: Franky Macho, MD;  [facility];  Service: Endoscopy;;   COLONOSCOPY WITH PROPOFOL N/A 11/28/2018   Procedure: COLONOSCOPY WITH PROPOFOL;  Surgeon: West Bali, MD;  [facility];  Service: Endoscopy;  Laterality: N/A;  7:30am   COLONOSCOPY WITH PROPOFOL N/A 06/28/2023   Procedure: COLONOSCOPY WITH PROPOFOL;  Surgeon: Franky Macho, MD;  [facility];  Service: Endoscopy;  Laterality: N/A;   ESOPHAGOGASTRODUODENOSCOPY (EGD) WITH PROPOFOL N/A 06/28/2023   Procedure: ESOPHAGOGASTRODUODENOSCOPY (EGD) WITH PROPOFOL;  Surgeon: Franky Macho, MD;  [facility];  Service: Endoscopy;  Laterality: N/A;   INNER EAR SURGERY     six surgery   MASS EXCISION N/A 10/08/2019   Procedure: EXCISION MASS ON TONGUE;  Surgeon: Newman Pies, MD;  Location: [facility];  Service: ENT;  Laterality: N/A;   NOSE SURGERY     x2   POLYPECTOMY  11/28/2018   Procedure: POLYPECTOMY;  Surgeon: West Bali, MD;  [facility];  Service: Endoscopy;;  cecal polyp, ascending polyps x4, polyp at hepatic flexure, descending colon polyps x4, rectal polyps x4    Prior to Admission medications   Medication Sig Start Date End Date Taking? Authorizing Provider  albuterol (PROVENTIL) (2.5 MG/3ML) 0.083% nebulizer solution Take 2.5 mg by nebulization 3 (three) times daily.   Yes [provider]  Albuterol Sulfate (PROAIR HFA IN) Inhale 2 puffs into the lungs every 6 (six) hours as needed (shortness of breath). Every 4-6 hou   Yes [provider]  Artificial Tear Solution (SOOTHE XP OP) Apply 1 drop to eye 2 (two) times daily.    Yes [provider]  atorvastatin (LIPITOR) 80 MG tablet Take 80 mg by mouth daily.   Yes [provider]  Fluticasone-Umeclidin-Vilant (TRELEGY ELLIPTA) 100-62.5-25 MCG/INH AEPB Inhale into the lungs.   Yes Alver Fisher, RN  furosemide (LASIX) 40 MG tablet Take 1 tablet (40 mg total) by mouth daily. 11/11/15  Yes Vassie Loll, MD  glipiZIDE (GLUCOTROL XL) 5 MG 24 hr tablet Take 5 mg by mouth 2 (two) times daily.   Yes [provider]  Iron-FA-B Cmp-C-Biot-Probiotic (FUSION PLUS) CAPS Take 1 capsule by mouth daily. 04/26/23  Yes [provider]  lisinopril (ZESTRIL) 10 MG tablet Take 10 mg  by mouth daily.   Yes [provider]  LORazepam (ATIVAN) 1 MG tablet Take 1 mg by mouth daily.    Yes [provider]  metFORMIN (GLUCOPHAGE) 1000 MG tablet Take 1,000 mg by mouth 2 (two) times daily. 06/20/23  Yes [provider]  mirtazapine (REMERON) 15 MG tablet Take 15 mg by mouth at bedtime.   Yes [provider]  Multiple Vitamin (MULTIVITAMIN WITH MINERALS) TABS tablet Take 1 tablet by mouth daily. 11/11/15  Yes Vassie Loll, MD   naltrexone (DEPADE) 50 MG tablet Take 50 mg by mouth daily.   Yes [provider]  Omega-3 Fatty Acids (FISH OIL) 1000 MG CAPS Take 1,000 mg by mouth daily.    Yes [provider]  Omeprazole 20 MG TBDD Take 20 mg by mouth daily.    Yes [provider]  pioglitazone (ACTOS) 30 MG tablet Take 30 mg by mouth daily.   Yes [provider]  risperiDONE (RISPERDAL) 0.5 MG tablet Take 0.5 mg by mouth at bedtime.   Yes [provider]  Thiamine HCl (VITAMIN B-1) 250 MG tablet Take 250 mg by mouth daily.   Yes [provider]  vitamin B-12 1000 MCG tablet Take 1 tablet (1,000 mcg total) by mouth daily. 11/11/15  Yes Vassie Loll, MD    Allergies as of 08/09/2023   (No Known Allergies)    Family History  Problem Relation Age of Onset   Colon cancer Neg Hx    Liver disease Neg Hx     Social History   Socioeconomic History   Marital status: Married    Spouse name: Not on file   Number of children: Not on file   Years of education: Not on file   Highest education level: Not on file  Occupational History   Not on file  Tobacco Use   Smoking status: Former    Current packs/day: 0.00    Average packs/day: 2.0 packs/day for 34.0 years (68.0 ttl pk-yrs)    Types: Cigarettes    Start date: 10/23/1981    Quit date: 10/24/2015    Years since quitting: 7.7   Smokeless tobacco: Never  Vaping Use   Vaping status: Never Used  Substance and Sexual Activity   Alcohol use: Not Currently    Comment: None since 2017   Drug use: No   Sexual activity: Not Currently  Other Topics Concern   Not on file  Social History Narrative   Not on file   Social Determinants of Health   Financial Resource Strain: Not on file  Food Insecurity: No Food Insecurity (06/26/2023)   Hunger Vital Sign    Worried About Running Out of Food in the Last Year: Never true    Ran Out of Food in the Last Year: Never true  Transportation Needs: No Transportation Needs  (06/26/2023)   PRAPARE - Administrator, Civil Service (Medical): No    Lack of Transportation (Non-Medical): No  Physical Activity: Not on file  Stress: Not on file  Social Connections: Not on file  Intimate Partner Violence: Not At Risk (06/26/2023)   Humiliation, Afraid, Rape, and Kick questionnaire    Fear of Current or Ex-Partner: No    Emotionally Abused: No    Physically Abused: No    Sexually Abused: No    Review of Systems: See HPI, otherwise negative ROS  Physical Exam: BP 125/81 (BP Location: Left Arm, Patient Position: Sitting, Cuff Size: Large)   Pulse  92   Temp 98.8 F (37.1 C) (Oral)   Ht 5\' 9"  (1.753 m)   Wt 229 lb (103.9 kg)   SpO2 93%   BMI 33.82 kg/m  General:   Alert,  Well-developed, well-nourished, pleasant and cooperative in NAD Neck:  Supple; no masses or thyromegaly. No significant cervical adenopathy. Lungs:  Clear throughout to auscultation.   No wheezes, crackles, or rhonchi. No acute distress. Heart:  Regular rate and rhythm; no murmurs, clicks, rubs,  or gallops. Abdomen: Non-distended, normal bowel sounds.  Soft and nontender without appreciable mass or hepatosplenomegaly.   Impression/Plan: 72 year old gentleman recently hospitalized with hematochezia.  Colonoscopy incomplete due to poor prep.  He has a history of advanced lesions removed previously.  EGD unrevealing.  It is time to offer him a surveillance colonoscopy.  Discussed the importance of at least an adequate preparation.  Recommendations:  As discussed, we need to repeat a colonoscopy.  Prep needs to be excellent.  The risks, benefits, limitations, alternatives and imponderables have been reviewed with the patient. Questions have been answered. All parties are agreeable.   ASA 3  Will check a CBC today  Further recommendations to follow.       Notice: This dictation was prepared with Dragon dictation along with smaller phrase technology. Any transcriptional  errors that result from this process are unintentional and may not be corrected upon review.

## 2023-08-09 NOTE — Patient Instructions (Signed)
It was good to see you again today!  As discussed, we need to repeat a colonoscopy.  Prep needs to be excellent. ASA 3  Will check a CBC today to see what your hemoglobin is doing  Further recommendations to follow.  Happy Thanksgiving!

## 2023-08-15 ENCOUNTER — Telehealth: Payer: Self-pay | Admitting: *Deleted

## 2023-08-15 NOTE — Telephone Encounter (Signed)
Called pt and spoke with spouse. Offered to schedule TCS with Dr. Jena Gauss, ASA 3 this Friday but stated they had other things to do and was unable. Advised will call once we get January schedule

## 2023-08-16 ENCOUNTER — Encounter: Payer: Self-pay | Admitting: *Deleted

## 2023-08-16 ENCOUNTER — Other Ambulatory Visit: Payer: Self-pay | Admitting: *Deleted

## 2023-08-16 MED ORDER — PEG 3350-KCL-NA BICARB-NACL 420 G PO SOLR: 4000.0000 mL | Freq: Once | ORAL | 0 refills | Status: AC

## 2023-08-16 NOTE — Telephone Encounter (Signed)
LMOVM to call back to schedule for Jan

## 2023-08-16 NOTE — Telephone Encounter (Signed)
Pt has been scheduled for 09/28/23. Instructions mailed and prep sent to the pharmacy..   [employer] PA: Approved Authorization #[id number]  Tracking [id number] Dates of service 09/28/2023 - 01/04/2024

## 2023-08-30 ENCOUNTER — Other Ambulatory Visit: Payer: Self-pay

## 2023-08-30 ENCOUNTER — Inpatient Hospital Stay (HOSPITAL_COMMUNITY): Payer: Medicare HMO

## 2023-08-30 ENCOUNTER — Encounter (INDEPENDENT_AMBULATORY_CARE_PROVIDER_SITE_OTHER): Payer: Self-pay | Admitting: *Deleted

## 2023-08-30 ENCOUNTER — Emergency Department (HOSPITAL_COMMUNITY): Payer: Medicare HMO

## 2023-08-30 ENCOUNTER — Inpatient Hospital Stay (HOSPITAL_COMMUNITY)
Admission: EM | Admit: 2023-08-30 | Discharge: 2023-09-03 | DRG: 070 | Disposition: A | Discharge status: Home / Self Care | Payer: Medicare HMO | Attending: Internal Medicine | Admitting: Internal Medicine

## 2023-08-30 ENCOUNTER — Encounter (HOSPITAL_COMMUNITY): Payer: Self-pay | Admitting: *Deleted

## 2023-08-30 DIAGNOSIS — E66811 Obesity, class 1: Secondary | ICD-10-CM | POA: Diagnosis present

## 2023-08-30 DIAGNOSIS — E119 Type 2 diabetes mellitus without complications: Secondary | ICD-10-CM | POA: Diagnosis not present

## 2023-08-30 DIAGNOSIS — I5032 Chronic diastolic (congestive) heart failure: Secondary | ICD-10-CM | POA: Diagnosis present

## 2023-08-30 DIAGNOSIS — J9601 Acute respiratory failure with hypoxia: Secondary | ICD-10-CM | POA: Diagnosis not present

## 2023-08-30 DIAGNOSIS — J441 Chronic obstructive pulmonary disease with (acute) exacerbation: Secondary | ICD-10-CM | POA: Diagnosis not present

## 2023-08-30 DIAGNOSIS — E1165 Type 2 diabetes mellitus with hyperglycemia: Secondary | ICD-10-CM | POA: Diagnosis not present

## 2023-08-30 DIAGNOSIS — H9190 Unspecified hearing loss, unspecified ear: Secondary | ICD-10-CM | POA: Diagnosis present

## 2023-08-30 DIAGNOSIS — R7989 Other specified abnormal findings of blood chemistry: Secondary | ICD-10-CM | POA: Diagnosis not present

## 2023-08-30 DIAGNOSIS — G9341 Metabolic encephalopathy: Secondary | ICD-10-CM | POA: Diagnosis not present

## 2023-08-30 DIAGNOSIS — K219 Gastro-esophageal reflux disease without esophagitis: Secondary | ICD-10-CM | POA: Diagnosis present

## 2023-08-30 DIAGNOSIS — Z6833 Body mass index (BMI) 33.0-33.9, adult: Secondary | ICD-10-CM

## 2023-08-30 DIAGNOSIS — F1096 Alcohol use, unspecified with alcohol-induced persisting amnestic disorder: Secondary | ICD-10-CM | POA: Diagnosis present

## 2023-08-30 DIAGNOSIS — R9089 Other abnormal findings on diagnostic imaging of central nervous system: Secondary | ICD-10-CM | POA: Diagnosis not present

## 2023-08-30 DIAGNOSIS — M160 Bilateral primary osteoarthritis of hip: Secondary | ICD-10-CM | POA: Diagnosis not present

## 2023-08-30 DIAGNOSIS — F03B0 Unspecified dementia, moderate, without behavioral disturbance, psychotic disturbance, mood disturbance, and anxiety: Secondary | ICD-10-CM | POA: Diagnosis present

## 2023-08-30 DIAGNOSIS — R4182 Altered mental status, unspecified: Secondary | ICD-10-CM | POA: Diagnosis present

## 2023-08-30 DIAGNOSIS — R262 Difficulty in walking, not elsewhere classified: Secondary | ICD-10-CM | POA: Diagnosis not present

## 2023-08-30 DIAGNOSIS — E876 Hypokalemia: Secondary | ICD-10-CM | POA: Diagnosis present

## 2023-08-30 DIAGNOSIS — R3911 Hesitancy of micturition: Secondary | ICD-10-CM | POA: Diagnosis present

## 2023-08-30 DIAGNOSIS — I160 Hypertensive urgency: Secondary | ICD-10-CM | POA: Diagnosis present

## 2023-08-30 DIAGNOSIS — I509 Heart failure, unspecified: Secondary | ICD-10-CM | POA: Diagnosis not present

## 2023-08-30 DIAGNOSIS — K921 Melena: Secondary | ICD-10-CM | POA: Diagnosis present

## 2023-08-30 DIAGNOSIS — E860 Dehydration: Secondary | ICD-10-CM | POA: Diagnosis present

## 2023-08-30 DIAGNOSIS — D509 Iron deficiency anemia, unspecified: Secondary | ICD-10-CM | POA: Diagnosis not present

## 2023-08-30 DIAGNOSIS — B348 Other viral infections of unspecified site: Secondary | ICD-10-CM | POA: Diagnosis present

## 2023-08-30 DIAGNOSIS — Z860100 Personal history of colon polyps, unspecified: Secondary | ICD-10-CM

## 2023-08-30 DIAGNOSIS — N401 Enlarged prostate with lower urinary tract symptoms: Secondary | ICD-10-CM | POA: Diagnosis present

## 2023-08-30 DIAGNOSIS — W19XXXA Unspecified fall, initial encounter: Secondary | ICD-10-CM | POA: Diagnosis present

## 2023-08-30 DIAGNOSIS — R338 Other retention of urine: Secondary | ICD-10-CM | POA: Diagnosis present

## 2023-08-30 DIAGNOSIS — Z87442 Personal history of urinary calculi: Secondary | ICD-10-CM

## 2023-08-30 DIAGNOSIS — R296 Repeated falls: Secondary | ICD-10-CM | POA: Diagnosis present

## 2023-08-30 DIAGNOSIS — L8990 Pressure ulcer of unspecified site, unspecified stage: Secondary | ICD-10-CM | POA: Insufficient documentation

## 2023-08-30 DIAGNOSIS — I4510 Unspecified right bundle-branch block: Secondary | ICD-10-CM | POA: Diagnosis present

## 2023-08-30 DIAGNOSIS — Z7951 Long term (current) use of inhaled steroids: Secondary | ICD-10-CM

## 2023-08-30 DIAGNOSIS — Z20822 Contact with and (suspected) exposure to covid-19: Secondary | ICD-10-CM | POA: Diagnosis not present

## 2023-08-30 DIAGNOSIS — Z87891 Personal history of nicotine dependence: Secondary | ICD-10-CM

## 2023-08-30 DIAGNOSIS — Z79899 Other long term (current) drug therapy: Secondary | ICD-10-CM

## 2023-08-30 DIAGNOSIS — I110 Hypertensive heart disease with heart failure: Secondary | ICD-10-CM | POA: Diagnosis not present

## 2023-08-30 DIAGNOSIS — R531 Weakness: Secondary | ICD-10-CM | POA: Diagnosis not present

## 2023-08-30 DIAGNOSIS — R0600 Dyspnea, unspecified: Secondary | ICD-10-CM | POA: Diagnosis not present

## 2023-08-30 DIAGNOSIS — R000 Tachycardia, unspecified: Secondary | ICD-10-CM | POA: Diagnosis not present

## 2023-08-30 DIAGNOSIS — N390 Urinary tract infection, site not specified: Secondary | ICD-10-CM | POA: Diagnosis present

## 2023-08-30 DIAGNOSIS — Z7984 Long term (current) use of oral hypoglycemic drugs: Secondary | ICD-10-CM

## 2023-08-30 DIAGNOSIS — J449 Chronic obstructive pulmonary disease, unspecified: Secondary | ICD-10-CM | POA: Diagnosis present

## 2023-08-30 LAB — TROPONIN I (HIGH SENSITIVITY)
Troponin I (High Sensitivity): 108 ng/L (ref <18)
Troponin I (High Sensitivity): 109 ng/L (ref <18)
Troponin I (High Sensitivity): 154 ng/L (ref <18)

## 2023-08-30 LAB — CBC
HCT: 32.4 % — ABNORMAL LOW (ref 39.0–52.0)
Hemoglobin: 10.2 g/dL — ABNORMAL LOW (ref 13.0–17.0)
MCH: 28 pg (ref 26.0–34.0)
MCHC: 31.5 g/dL (ref 30.0–36.0)
MCV: 89 fL (ref 80.0–100.0)
Platelets: 267 10*3/uL (ref 150–400)
RBC: 3.64 MIL/uL — ABNORMAL LOW (ref 4.22–5.81)
RDW: 14.5 % (ref 11.5–15.5)
WBC: 10.8 10*3/uL — ABNORMAL HIGH (ref 4.0–10.5)
nRBC: 0 % (ref 0.0–0.2)

## 2023-08-30 LAB — URINALYSIS, ROUTINE W REFLEX MICROSCOPIC
Bilirubin Urine: NEGATIVE
Glucose, UA: NEGATIVE mg/dL
Ketones, ur: 20 mg/dL — AB
Leukocytes,Ua: NEGATIVE
Nitrite: NEGATIVE
Protein, ur: 100 mg/dL — AB
Specific Gravity, Urine: 1.008 (ref 1.005–1.030)
pH: 7 (ref 5.0–8.0)

## 2023-08-30 LAB — COMPREHENSIVE METABOLIC PANEL
ALT: 21 U/L (ref 0–44)
AST: 56 U/L — ABNORMAL HIGH (ref 15–41)
Albumin: 3.6 g/dL (ref 3.5–5.0)
Alkaline Phosphatase: 73 U/L (ref 38–126)
Anion gap: 10 (ref 5–15)
BUN: 13 mg/dL (ref 8–23)
CO2: 28 mmol/L (ref 22–32)
Calcium: 9.3 mg/dL (ref 8.9–10.3)
Chloride: 99 mmol/L (ref 98–111)
Creatinine, Ser: 1.05 mg/dL (ref 0.61–1.24)
GFR, Estimated: >60 mL/min (ref >60)
Glucose, Bld: 110 mg/dL — ABNORMAL HIGH (ref 70–99)
Potassium: 3.2 mmol/L — ABNORMAL LOW (ref 3.5–5.1)
Sodium: 137 mmol/L (ref 135–145)
Total Bilirubin: 0.9 mg/dL (ref <1.2)
Total Protein: 7.2 g/dL (ref 6.5–8.1)

## 2023-08-30 LAB — ETHANOL: Alcohol, Ethyl (B): <10 mg/dL (ref <10)

## 2023-08-30 LAB — MAGNESIUM: Magnesium: 1.3 mg/dL — ABNORMAL LOW (ref 1.7–2.4)

## 2023-08-30 LAB — APTT: aPTT: 33 s (ref 24–36)

## 2023-08-30 LAB — HEMOGLOBIN A1C
Hgb A1c MFr Bld: 5.5 % (ref 4.8–5.6)
Mean Plasma Glucose: 111.15 mg/dL

## 2023-08-30 LAB — PROTIME-INR
INR: 1.1 (ref 0.8–1.2)
Prothrombin Time: 14.6 s (ref 11.4–15.2)

## 2023-08-30 LAB — CBG MONITORING, ED: Glucose-Capillary: 123 mg/dL — ABNORMAL HIGH (ref 70–99)

## 2023-08-30 LAB — MRSA NEXT GEN BY PCR, NASAL: MRSA by PCR Next Gen: NOT DETECTED

## 2023-08-30 LAB — BRAIN NATRIURETIC PEPTIDE: B Natriuretic Peptide: 57 pg/mL (ref 0.0–100.0)

## 2023-08-30 MED ORDER — BISACODYL 5 MG PO TBEC
5.0000 mg | DELAYED_RELEASE_TABLET | Freq: Every day | ORAL | Status: DC | PRN
  Administered 2023-09-01: 5 mg via ORAL
  Filled 2023-08-30: qty 1

## 2023-08-30 MED ORDER — ENOXAPARIN SODIUM 60 MG/0.6ML IJ SOSY
50.0000 mg | PREFILLED_SYRINGE | INTRAMUSCULAR | Status: DC
  Administered 2023-08-30 – 2023-09-01 (×3): 50 mg via SUBCUTANEOUS
  Filled 2023-08-30 (×3): qty 0.6

## 2023-08-30 MED ORDER — IPRATROPIUM-ALBUTEROL 0.5-2.5 (3) MG/3ML IN SOLN
3.0000 mL | RESPIRATORY_TRACT | Status: DC | PRN
  Administered 2023-09-01 – 2023-09-03 (×2): 3 mL via RESPIRATORY_TRACT
  Filled 2023-08-30: qty 3

## 2023-08-30 MED ORDER — ACETAMINOPHEN 650 MG RE SUPP
650.0000 mg | Freq: Four times a day (QID) | RECTAL | Status: DC | PRN
  Administered 2023-08-30: 650 mg via RECTAL
  Filled 2023-08-30: qty 1

## 2023-08-30 MED ORDER — INSULIN ASPART 100 UNIT/ML IJ SOLN
0.0000 [IU] | Freq: Three times a day (TID) | INTRAMUSCULAR | Status: DC
  Administered 2023-08-31 – 2023-09-01 (×4): 1 [IU] via SUBCUTANEOUS
  Administered 2023-09-02: 3 [IU] via SUBCUTANEOUS
  Administered 2023-09-02 – 2023-09-03 (×2): 2 [IU] via SUBCUTANEOUS

## 2023-08-30 MED ORDER — POTASSIUM CHLORIDE 10 MEQ/100ML IV SOLN
10.0000 meq | Freq: Once | INTRAVENOUS | Status: AC
  Administered 2023-08-30: 10 meq via INTRAVENOUS
  Filled 2023-08-30: qty 100

## 2023-08-30 MED ORDER — LORAZEPAM BOLUS VIA INFUSION
2.0000 mg | INTRAVENOUS | Status: DC | PRN
Start: 2023-08-30 — End: 2023-09-01

## 2023-08-30 MED ORDER — METOPROLOL TARTRATE 5 MG/5ML IV SOLN
5.0000 mg | Freq: Once | INTRAVENOUS | Status: AC
  Administered 2023-08-30: 5 mg via INTRAVENOUS
  Filled 2023-08-30: qty 5

## 2023-08-30 MED ORDER — MAGNESIUM SULFATE 2 GM/50ML IV SOLN
2.0000 g | Freq: Once | INTRAVENOUS | Status: AC
  Administered 2023-08-30: 2 g via INTRAVENOUS
  Filled 2023-08-30: qty 50

## 2023-08-30 MED ORDER — LORAZEPAM 2 MG/ML IJ SOLN: 2.0000 mg | Freq: Once | INTRAMUSCULAR | Status: DC

## 2023-08-30 MED ORDER — ENOXAPARIN SODIUM 60 MG/0.6ML IJ SOSY: 50.0000 mg | PREFILLED_SYRINGE | INTRAMUSCULAR | Status: DC

## 2023-08-30 MED ORDER — METOPROLOL TARTRATE 5 MG/5ML IV SOLN
5.0000 mg | INTRAVENOUS | Status: DC | PRN
  Administered 2023-09-01: 5 mg via INTRAVENOUS
  Filled 2023-08-30: qty 5

## 2023-08-30 MED ORDER — LORAZEPAM 2 MG/ML IJ SOLN
INTRAMUSCULAR | Status: AC
  Filled 2023-08-30: qty 1

## 2023-08-30 MED ORDER — HALOPERIDOL LACTATE 5 MG/ML IJ SOLN
2.5000 mg | Freq: Once | INTRAMUSCULAR | Status: AC
  Administered 2023-08-30: 2.5 mg via INTRAVENOUS
  Filled 2023-08-30: qty 1

## 2023-08-30 MED ORDER — LORAZEPAM 2 MG/ML IJ SOLN
2.0000 mg | Freq: Once | INTRAMUSCULAR | Status: AC
  Administered 2023-08-30: 2 mg via INTRAVENOUS

## 2023-08-30 MED ORDER — ACETAMINOPHEN 325 MG PO TABS
650.0000 mg | ORAL_TABLET | Freq: Four times a day (QID) | ORAL | Status: DC | PRN
  Administered 2023-08-31: 650 mg via ORAL
  Filled 2023-08-30: qty 2

## 2023-08-30 MED ORDER — THIAMINE MONONITRATE 100 MG PO TABS
250.0000 mg | ORAL_TABLET | Freq: Every day | ORAL | Status: DC
  Administered 2023-08-30 – 2023-09-03 (×5): 250 mg via ORAL
  Filled 2023-08-30 (×5): qty 3

## 2023-08-30 MED ORDER — HEPARIN (PORCINE) 25000 UT/250ML-% IV SOLN
1400.0000 [IU]/h | INTRAVENOUS | Status: DC
  Filled 2023-08-30: qty 250

## 2023-08-30 MED ORDER — HEPARIN BOLUS VIA INFUSION
4000.0000 [IU] | Freq: Once | INTRAVENOUS | Status: DC
  Filled 2023-08-30: qty 4000

## 2023-08-30 MED ORDER — TAMSULOSIN HCL 0.4 MG PO CAPS
0.4000 mg | ORAL_CAPSULE | Freq: Every day | ORAL | Status: DC
  Administered 2023-08-30 – 2023-09-03 (×5): 0.4 mg via ORAL
  Filled 2023-08-30 (×5): qty 1

## 2023-08-30 MED ORDER — CHLORHEXIDINE GLUCONATE CLOTH 2 % EX PADS
6.0000 | MEDICATED_PAD | Freq: Every day | CUTANEOUS | Status: DC
  Administered 2023-08-30 – 2023-09-03 (×5): 6 via TOPICAL

## 2023-08-30 MED ORDER — RISPERIDONE 0.5 MG PO TABS
0.5000 mg | ORAL_TABLET | Freq: Every day | ORAL | Status: DC
  Administered 2023-08-30 – 2023-09-02 (×4): 0.5 mg via ORAL
  Filled 2023-08-30 (×4): qty 1

## 2023-08-30 MED ORDER — SODIUM CHLORIDE 0.9 % IV SOLN
1.0000 g | INTRAVENOUS | Status: AC
  Administered 2023-08-30 – 2023-09-01 (×3): 1 g via INTRAVENOUS
  Filled 2023-08-30 (×3): qty 10

## 2023-08-30 MED ORDER — LISINOPRIL 10 MG PO TABS
10.0000 mg | ORAL_TABLET | Freq: Every day | ORAL | Status: DC
  Administered 2023-08-30 – 2023-09-01 (×3): 10 mg via ORAL
  Filled 2023-08-30 (×3): qty 1

## 2023-08-30 MED ORDER — POLYETHYLENE GLYCOL 3350 17 G PO PACK
17.0000 g | PACK | Freq: Every day | ORAL | Status: DC
  Administered 2023-08-31 – 2023-09-03 (×4): 17 g via ORAL
  Filled 2023-08-30 (×4): qty 1

## 2023-08-30 NOTE — ED Notes (Signed)
Blood delayed due to inability to obtain IV access

## 2023-08-30 NOTE — H&P (Addendum)
History and Physical  Jeff Miles JIR:[id number] DOB: 11-21-1950 DOA: 08/30/2023 PCP: Benita Stabile, MD  Chief Complaint: AMS Historian: patient's wife and son  HPI:  SANSKAR Miles is a 72 y.o. male with a PMH significant for dementia, COPD, HTN, CHF, anemia, type II DM, anxiety, GERD. At baseline, they live at home with his wife and are ambulatory but moderate dementia.  They presented from home to the ED on 08/30/2023 with AMS. Difficult to pinpoint onset due to reported fluctuation of baseline mental status from family members due to baseline dementia and Korsakoff but overall report about 3 days of worsening confusion. He also had a fall today which resulted in a head injury. He had been ambulating abnormally for the three days with right leg dragging behind left and moving at slower pace.  They describe urinary hesitancy and are unsure of his UOP volume since he goes to the bathroom independently. He has been eating normally and not complaining of acute pain, nausea, vomiting.    Patient is unable to provide his own history.  He has chronic tongue fasciculations but this has been worse since today.  In the ED, it was found that they had significant disorientation and agitation. He also had elevated HR to 140s which appeared to be afib/flutter on bedside ECG. After 2 doses of lopressor IV, his repeat ECG showed sinus rhythm at 98 HR. Had urinary retention and required foley placed which resulted in 1453cc UOP.  Also noted to have temp of 101.5 and oxygen requirement of 3L [place] due to desat to 88% on room air. Blood pressures significantly elevated to 180s systolics which improved to 140s with lopressor. He had not had his home medications today.  Other significant findings included: WBC 10.8, hemoglobin 10.2, platelets 267.  Mg++ 1.3, K+ 3.2.  Troponin 108>109.  BNP 57.  Urinalysis positive for moderate hemoglobin, ketones, 100 protein, rare bacteria, negative nitrites and  leukocytes. Chest xray negative for active disease.  Hip xray: negative for fracture Head CT: negative acute intracranial abnormality   They were initially treated with potassium, magnesium, haldol, and ativan in the ED.   Patient was admitted to medicine service for further workup and management of AMS as outlined in detail below.  Assessment/Plan Principal Problem:   AMS (altered mental status) Active Problems:   Acute metabolic encephalopathy   Weakness   Fall   Hypokalemia   Acute metabolic encephalopathy  dementia  h/o korsakoff - family denies recent medication changes or alcohol use. Had fall with head injury but negative trauma on head CT.  - brain MRI, reported focal weakness of R leg for 3 days and patient unable to participate in exam to evaluate for his strength currently due to agitation/disorientation to evaluate for stroke - safety monitor - SLP -PT/OT - avoid haldol - continue home medications including ativan, risperidone to avoid withdrawal - treat contributing causes below - blood cultures - continue thiamine   Acute urinary retention- consistent with BPH from the history obtained from family but no known history. foley placed in ED with >1.3L UOP initially - strict I/O - start flomax - consider urology consult vs void trial - urinalysis not acutely positive for infection but with leukocytosis and fever, will start empiric Abx for UTI and follow urine culture  - CTX  Acute onset afib/aflutter with RVR- no history of. HR up to 140s during my encounter. S/p IV lopressor x2 and converted to NSR. Mildly elevated and flat troponin likely  from demand ischemia but will trend another troponin. No recent echo. Near normal function in 2017 - cardiology consulted - continuous telemetry - ordering brain MRI. Head CT negative for acute bleed from fall but with focal weakness prior to fall and new onset afib/flutter for unknown duration- high risk of stroke. Consult  neuro if positive - hold off heparin gtt - repeat troponin  - PRN lopressor - family states that he has chronic LE edema. 2+ on exam. Will order echo.   Electrolyte abnormalities- Mg++ and K+ repleted in ED - BMP am  Hypertensive urgency- systolics up to 180s. Contributing to elevated troponin  - restart home medications which were not given this am due to AMS and monitor given the lopressor also given improvement - lisinopril   Acute hypoxic respiratory failure  COPD- no oxygen requirement at baseline. Currently on 3L - wean O2 as tolerated - duonebs  Type II DM- not on insulin - sSSI with meals - A1c  Past Medical History:  Diagnosis Date   CHF (congestive heart failure) (HCC)    COPD (chronic obstructive pulmonary disease) (HCC)    Dementia (HCC)    alcohol related   Diabetes (HCC)    History of kidney stones    Hypertension    Unsteady gait    Wernicke's encephalopathy    Past Surgical History:  Procedure Laterality Date   BACK SURGERY     two   BIOPSY  06/28/2023   Procedure: BIOPSY;  Surgeon: Franky Macho, MD;  Location: [facility];  Service: Endoscopy;;   COLONOSCOPY WITH PROPOFOL N/A 11/28/2018   Procedure: COLONOSCOPY WITH PROPOFOL;  Surgeon: West Bali, MD;  Location: [facility];  Service: Endoscopy;  Laterality: N/A;  7:30am   COLONOSCOPY WITH PROPOFOL N/A 06/28/2023   Procedure: COLONOSCOPY WITH PROPOFOL;  Surgeon: Franky Macho, MD;  Location: [facility];  Service: Endoscopy;  Laterality: N/A;   ESOPHAGOGASTRODUODENOSCOPY (EGD) WITH PROPOFOL N/A 06/28/2023   Procedure: ESOPHAGOGASTRODUODENOSCOPY (EGD) WITH PROPOFOL;  Surgeon: Franky Macho, MD;  Location: [facility];  Service: Endoscopy;  Laterality: N/A;   INNER EAR SURGERY     six surgery   MASS EXCISION N/A 10/08/2019   Procedure: EXCISION MASS ON TONGUE;  Surgeon: Newman Pies, MD;  Location: [facility];  Service: ENT;  Laterality: N/A;   NOSE SURGERY     x2    POLYPECTOMY  11/28/2018   Procedure: POLYPECTOMY;  Surgeon: West Bali, MD;  Location: [facility];  Service: Endoscopy;;  cecal polyp, ascending polyps x4, polyp at hepatic flexure, descending colon polyps x4, rectal polyps x4     reports that he quit smoking about 7 years ago. His smoking use included cigarettes. He started smoking about 41 years ago. He has a 68 pack-year smoking history. He has never used smokeless tobacco. He reports that he does not currently use alcohol. He reports that he does not use drugs.  No Known Allergies  Family History  Problem Relation Age of Onset   Colon cancer Neg Hx    Liver disease Neg Hx     Prior to Admission medications   Medication Sig Start Date End Date Taking? Authorizing Provider  albuterol (PROVENTIL) (2.5 MG/3ML) 0.083% nebulizer solution Take 2.5 mg by nebulization 3 (three) times daily.    [provider]  Albuterol Sulfate (PROAIR HFA IN) Inhale 2 puffs into the lungs every 6 (six) hours as needed (shortness of breath). Every 4-6 hou  [provider]  Artificial Tear Solution (SOOTHE XP OP) Apply 1 drop to eye 2 (two) times daily.     [provider]  atorvastatin (LIPITOR) 80 MG tablet Take 80 mg by mouth daily.    [provider]  Fluticasone-Umeclidin-Vilant (TRELEGY ELLIPTA) 100-62.5-25 MCG/INH AEPB Inhale into the lungs.    Alver Fisher, RN  furosemide (LASIX) 40 MG tablet Take 1 tablet (40 mg total) by mouth daily. 11/11/15   Vassie Loll, MD  glipiZIDE (GLUCOTROL XL) 5 MG 24 hr tablet Take 5 mg by mouth 2 (two) times daily.    [provider]  Iron-FA-B Cmp-C-Biot-Probiotic (FUSION PLUS) CAPS Take 1 capsule by mouth daily. 04/26/23   [provider]  lisinopril (ZESTRIL) 10 MG tablet Take 10 mg by mouth daily.    [provider]  LORazepam (ATIVAN) 1 MG tablet Take 1 mg by mouth daily.     [provider]  metFORMIN (GLUCOPHAGE) 1000 MG tablet Take  1,000 mg by mouth 2 (two) times daily. 06/20/23   [provider]  mirtazapine (REMERON) 15 MG tablet Take 15 mg by mouth at bedtime.    [provider]  Multiple Vitamin (MULTIVITAMIN WITH MINERALS) TABS tablet Take 1 tablet by mouth daily. 11/11/15   Vassie Loll, MD  naltrexone (DEPADE) 50 MG tablet Take 50 mg by mouth daily.    [provider]  Omega-3 Fatty Acids (FISH OIL) 1000 MG CAPS Take 1,000 mg by mouth daily.     [provider]  Omeprazole 20 MG TBDD Take 20 mg by mouth daily.     [provider]  pioglitazone (ACTOS) 30 MG tablet Take 30 mg by mouth daily.    [provider]  risperiDONE (RISPERDAL) 0.5 MG tablet Take 0.5 mg by mouth at bedtime.    [provider]  Thiamine HCl (VITAMIN B-1) 250 MG tablet Take 250 mg by mouth daily.    [provider]  vitamin B-12 1000 MCG tablet Take 1 tablet (1,000 mcg total) by mouth daily. 11/11/15   Vassie Loll, MD   I have personally, briefly reviewed patient's prior medical records in [facility]  Objective: Blood pressure (!) 147/68, pulse 100, temperature 97.6 F (36.4 C), temperature source Oral, resp. rate 19, height 5\' 9"  (1.753 m), weight 103.8 kg, SpO2 94%.   Constitutional: alert, agitated and pulling at foley/telemetry wires Neck: normal, supple, no masses, no thyromegaly Respiratory: CTAB, no wheezing, no crackles. Normal respiratory effort. No accessory muscle use.  Cardiovascular: rapid heart rate, irregular beat. no murmurs / rubs / gallops. 2+ lower extremity edema (family states this is baseline). 2+ pedal pulses. no clubbing / cyanosis.  Abdomen: soft, obese, does not appear tender to palpation Musculoskeletal: No joint deformity upper and lower extremities. Normal muscle tone.  Skin: chronic skin changes on LE. No open wounds Neurologic: Alert and not able to orient or follow directions. Moving all extremities  Labs on Admission: I have  personally reviewed admission labs and imaging studies  CBC    Component Value Date/Time   WBC 10.8 (H) 08/30/2023 1128   RBC 3.64 (L) 08/30/2023 1128   HGB 10.2 (L) 08/30/2023 1128   HCT 32.4 (L) 08/30/2023 1128   PLT 267 08/30/2023 1128   MCV 89.0 08/30/2023 1128   MCH 28.0 08/30/2023 1128   MCHC 31.5 08/30/2023 1128   RDW 14.5 08/30/2023 1128   LYMPHSABS 1.2 06/26/2023 1632   MONOABS 0.5 06/26/2023 1632  EOSABS 126 08/09/2023 1507   BASOSABS 27 08/09/2023 1507   CMP     Component Value Date/Time   NA 137 08/30/2023 1128   K 3.2 (L) 08/30/2023 1128   CL 99 08/30/2023 1128   CO2 28 08/30/2023 1128   GLUCOSE 110 (H) 08/30/2023 1128   BUN 13 08/30/2023 1128   CREATININE 1.05 08/30/2023 1128   CALCIUM 9.3 08/30/2023 1128   PROT 7.2 08/30/2023 1128   ALBUMIN 3.6 08/30/2023 1128   AST 56 (H) 08/30/2023 1128   ALT 21 08/30/2023 1128   ALKPHOS 73 08/30/2023 1128   BILITOT 0.9 08/30/2023 1128   GFRNONAA >60 08/30/2023 1128   GFRAA >60 10/04/2019 1047    Radiological Exams on Admission: DG HIPS BILAT WITH PELVIS MIN 5 VIEWS Result Date: 08/30/2023 CLINICAL DATA:  [id number] Fall [id number].  Difficulty walking. EXAM: DG HIP (WITH OR WITHOUT PELVIS) 5+V BILAT COMPARISON:  None Available. FINDINGS: Pelvis is intact with normal and symmetric sacroiliac joints. No acute fracture or dislocation. No aggressive osseous lesion. Probable old/healed right greater trochanter avulsion fracture noted. Visualized sacral arcuate lines are unremarkable. There are changes of chronic pubic symphisitis. There are mild degenerative changes of bilateral hip joints without significant joint space narrowing. Osteophytosis of the superior acetabulum. No radiopaque foreign bodies. IMPRESSION: *No acute osseous abnormality of the pelvis or bilateral hip joints. Electronically Signed   By: Jules Schick M.D.   On: 08/30/2023 14:08   DG Chest 1 View Result Date: 08/30/2023 CLINICAL DATA:  Weakness.  Frequent  falls and difficulty walking. EXAM: CHEST  1 VIEW COMPARISON:  11/03/2015. FINDINGS: Bilateral lung fields are clear. Bilateral costophrenic angles are clear. Normal cardio-mediastinal silhouette. No acute osseous abnormalities. The soft tissues are within normal limits. IMPRESSION: No active disease. Electronically Signed   By: Jules Schick M.D.   On: 08/30/2023 14:06   CT Head Wo Contrast Result Date: 08/30/2023 CLINICAL DATA:  Frequent falls and difficulty walking EXAM: CT HEAD WITHOUT CONTRAST TECHNIQUE: Contiguous axial images were obtained from the base of the skull through the vertex without intravenous contrast. RADIATION DOSE REDUCTION: This exam was performed according to the departmental dose-optimization program which includes automated exposure control, adjustment of the mA and/or kV according to patient size and/or use of iterative reconstruction technique. COMPARISON:  None Available. FINDINGS: Brain: No evidence of acute infarction, hemorrhage, mass, mass effect, or midline shift. No hydrocephalus or extra-axial fluid collection. Vascular: No hyperdense vessel. Skull: Negative for fracture or focal lesion. Sinuses/Orbits: Mucosal thickening in the left sphenoid sinus. No acute finding in the orbits. Fluid in the pneumatized portion of the bilateral mastoid air cells, as well as in the left greater than right middle ear. IMPRESSION: 1. No acute intracranial process. 2. Fluid in the pneumatized portion of the bilateral mastoid air cells, as well as in the left greater than right middle ear. Correlate for symptoms of otomastoiditis or otitis media. Electronically Signed   By: Wiliam Ke M.D.   On: 08/30/2023 12:34   EKG: Independently reviewed. Afib HR 130s and repeat is NSR at 90s HR. No ischemic changes  DVT prophylaxis:  lovenox  Code Status: full  Family Communication: wife and son at bedside  Disposition Plan: admit to stepdown   Consults called: consult placed for cardiology    Leeroy Bock, DO [facility]  08/30/2023, 5:14 PM    To contact the appropriate [facility] Attending or Consulting provider: Check [email or web address] for coverage from 7pm-7am

## 2023-08-30 NOTE — ED Provider Notes (Signed)
[place] EMERGENCY DEPARTMENT AT [facility] Provider Note   CSN: [id number] Arrival date & time: 08/30/23  7829     History  Chief Complaint  Patient presents with   Marletta Lor    Jeff Miles is a 72 y.o. male presenting for evaluation of increased generalized weakness along with a fall which occurred this morning.  Family members at bedside states he has frequent falls with difficulty walking at baseline, last several days that he has been found to be shuffling with his left leg more pronounced which they suspect led to a fall this morning.  His wife found him laying in the hallway of their home, she suspects he may have been laying there possibly up to an hour but not more.  She was unable to help him up, family members came and assisted him, son at the bedside states he helped the patient walk but it took about 40 minutes to walk up approximately 30 feet.  He does endorse hitting his head, he denies headache, he states his left hip "hurts a little bit".  Past medical history is significant for advanced COPD, he is not on home oxygen, he has a chronic productive cough, family states his oxygen level was 88% prior to arrival here.  I also has a history of CHF, type 2 diabetes, GERD, hypomagnesium, anemia and was recently diagnosed with hematochezia and is anticipating a colonoscopy under the care of Dr. Jena Gauss next month.  Wife states that he is currently receiving iron transfusions.  Patient is also extremely hard of hearing  The history is provided by the spouse, a relative and the patient. The history is limited by the condition of the patient (h/o dementia,  family states he denies sx frequently).       Home Medications Prior to Admission medications   Medication Sig Start Date End Date Taking? Authorizing Provider  albuterol (PROVENTIL) (2.5 MG/3ML) 0.083% nebulizer solution Take 2.5 mg by nebulization 3 (three) times daily.    [provider]  Albuterol  Sulfate (PROAIR HFA IN) Inhale 2 puffs into the lungs every 6 (six) hours as needed (shortness of breath). Every 4-6 hou    [provider]  Artificial Tear Solution (SOOTHE XP OP) Apply 1 drop to eye 2 (two) times daily.     [provider]  atorvastatin (LIPITOR) 80 MG tablet Take 80 mg by mouth daily.    [provider]  Fluticasone-Umeclidin-Vilant (TRELEGY ELLIPTA) 100-62.5-25 MCG/INH AEPB Inhale into the lungs.    Alver Fisher, RN  furosemide (LASIX) 40 MG tablet Take 1 tablet (40 mg total) by mouth daily. 11/11/15   Vassie Loll, MD  glipiZIDE (GLUCOTROL XL) 5 MG 24 hr tablet Take 5 mg by mouth 2 (two) times daily.    [provider]  Iron-FA-B Cmp-C-Biot-Probiotic (FUSION PLUS) CAPS Take 1 capsule by mouth daily. 04/26/23   [provider]  lisinopril (ZESTRIL) 10 MG tablet Take 10 mg by mouth daily.    [provider]  LORazepam (ATIVAN) 1 MG tablet Take 1 mg by mouth daily.     [provider]  metFORMIN (GLUCOPHAGE) 1000 MG tablet Take 1,000 mg by mouth 2 (two) times daily. 06/20/23   [provider]  mirtazapine (REMERON) 15 MG tablet Take 15 mg by mouth at bedtime.    [provider]  Multiple Vitamin (MULTIVITAMIN WITH MINERALS) TABS tablet Take 1 tablet by mouth daily. 11/11/15   Vassie Loll, MD  naltrexone (DEPADE)  50 MG tablet Take 50 mg by mouth daily.    [provider]  Omega-3 Fatty Acids (FISH OIL) 1000 MG CAPS Take 1,000 mg by mouth daily.     [provider]  Omeprazole 20 MG TBDD Take 20 mg by mouth daily.     [provider]  pioglitazone (ACTOS) 30 MG tablet Take 30 mg by mouth daily.    [provider]  risperiDONE (RISPERDAL) 0.5 MG tablet Take 0.5 mg by mouth at bedtime.    [provider]  Thiamine HCl (VITAMIN B-1) 250 MG tablet Take 250 mg by mouth daily.    [provider]  vitamin B-12 1000 MCG tablet Take 1 tablet (1,000 mcg  total) by mouth daily. 11/11/15   Vassie Loll, MD      Allergies    Patient has no known allergies.    Review of Systems   Review of Systems  Constitutional:  Negative for fever.  HENT:  Negative for congestion.   Eyes: Negative.   Respiratory:  Positive for cough and shortness of breath. Negative for chest tightness.   Genitourinary: Negative.   Musculoskeletal:  Positive for arthralgias and gait problem. Negative for joint swelling and neck pain.  Skin: Negative.  Negative for rash.  Neurological:  Positive for weakness. Negative for dizziness, light-headedness and headaches.  Psychiatric/Behavioral: Negative.      Physical Exam Updated Vital Signs BP (!) 147/68   Pulse 100   Temp 97.6 F (36.4 C) (Oral)   Resp 19   Ht 5\' 9"  (1.753 m)   Wt 103.8 kg   SpO2 94%   BMI 33.79 kg/m  Physical Exam Vitals and nursing note reviewed.  Constitutional:      Appearance: He is well-developed.  HENT:     Head: Normocephalic and atraumatic.     Mouth/Throat:     Pharynx: Oropharynx is clear.  Eyes:     Conjunctiva/sclera: Conjunctivae normal.  Cardiovascular:     Rate and Rhythm: Regular rhythm. Tachycardia present.     Heart sounds: Normal heart sounds.  Pulmonary:     Effort: Pulmonary effort is normal.     Breath sounds: No stridor. Rhonchi present. No wheezing.  Abdominal:     General: Bowel sounds are normal.     Palpations: Abdomen is soft.     Tenderness: There is no abdominal tenderness. There is no guarding.  Musculoskeletal:        General: No swelling or deformity. Normal range of motion.     Cervical back: Normal range of motion.     Right lower leg: Edema present.     Left lower leg: No swelling or deformity. Edema present.     Comments: No bony ttp but pt reports "hurts a little bit" with passive ROM of left leg hip  Skin:    General: Skin is warm and dry.     Findings: Erythema and rash present.     Comments: Bilateral lower extremity stasis dermatitis   Neurological:     Mental Status: He is alert. Mental status is at baseline.     Comments: At baseline per family.  HOH,  dementia, but does answer meaningfully.     ED Results / Procedures / Treatments   Labs (all labs ordered are listed, but only abnormal results are displayed) Labs Reviewed  CBC - Abnormal; Notable for the following components:      Result Value   WBC 10.8 (*)    RBC 3.64 (*)  Hemoglobin 10.2 (*)    HCT 32.4 (*)    All other components within normal limits  URINALYSIS, ROUTINE W REFLEX MICROSCOPIC - Abnormal; Notable for the following components:   Hgb urine dipstick MODERATE (*)    Ketones, ur 20 (*)    Protein, ur 100 (*)    Bacteria, UA RARE (*)    All other components within normal limits  MAGNESIUM - Abnormal; Notable for the following components:   Magnesium 1.3 (*)    All other components within normal limits  COMPREHENSIVE METABOLIC PANEL - Abnormal; Notable for the following components:   Potassium 3.2 (*)    Glucose, Bld 110 (*)    AST 56 (*)    All other components within normal limits  CBG MONITORING, ED - Abnormal; Notable for the following components:   Glucose-Capillary 123 (*)    All other components within normal limits  TROPONIN I (HIGH SENSITIVITY) - Abnormal; Notable for the following components:   Troponin I (High Sensitivity) 108 (*)    All other components within normal limits  TROPONIN I (HIGH SENSITIVITY) - Abnormal; Notable for the following components:   Troponin I (High Sensitivity) 109 (*)    All other components within normal limits  BRAIN NATRIURETIC PEPTIDE    EKG EKG Interpretation Date/Time:  Tuesday August 30 2023 10:12:46 EST Ventricular Rate:  115 PR Interval:  148 QRS Duration:  132 QT Interval:  350 QTC Calculation: 484 R Axis:   64  Text Interpretation: Sinus tachycardia with occasional Premature ventricular complexes Right bundle branch block Abnormal ECG When compared with ECG of 26-Jun-2023 16:16,  Premature ventricular complexes are now Present Questionable change in QRS axis Confirmed by Bethann Berkshire [phone]) on 08/30/2023 12:17:03 PM  Radiology DG HIPS BILAT WITH PELVIS MIN 5 VIEWS Result Date: 08/30/2023 CLINICAL DATA:  [id number] Fall 190176.  Difficulty walking. EXAM: DG HIP (WITH OR WITHOUT PELVIS) 5+V BILAT COMPARISON:  None Available. FINDINGS: Pelvis is intact with normal and symmetric sacroiliac joints. No acute fracture or dislocation. No aggressive osseous lesion. Probable old/healed right greater trochanter avulsion fracture noted. Visualized sacral arcuate lines are unremarkable. There are changes of chronic pubic symphisitis. There are mild degenerative changes of bilateral hip joints without significant joint space narrowing. Osteophytosis of the superior acetabulum. No radiopaque foreign bodies. IMPRESSION: *No acute osseous abnormality of the pelvis or bilateral hip joints. Electronically Signed   By: Jules Schick M.D.   On: 08/30/2023 14:08   DG Chest 1 View Result Date: 08/30/2023 CLINICAL DATA:  Weakness.  Frequent falls and difficulty walking. EXAM: CHEST  1 VIEW COMPARISON:  11/03/2015. FINDINGS: Bilateral lung fields are clear. Bilateral costophrenic angles are clear. Normal cardio-mediastinal silhouette. No acute osseous abnormalities. The soft tissues are within normal limits. IMPRESSION: No active disease. Electronically Signed   By: Jules Schick M.D.   On: 08/30/2023 14:06   CT Head Wo Contrast Result Date: 08/30/2023 CLINICAL DATA:  Frequent falls and difficulty walking EXAM: CT HEAD WITHOUT CONTRAST TECHNIQUE: Contiguous axial images were obtained from the base of the skull through the vertex without intravenous contrast. RADIATION DOSE REDUCTION: This exam was performed according to the departmental dose-optimization program which includes automated exposure control, adjustment of the mA and/or kV according to patient size and/or use of iterative reconstruction  technique. COMPARISON:  None Available. FINDINGS: Brain: No evidence of acute infarction, hemorrhage, mass, mass effect, or midline shift. No hydrocephalus or extra-axial fluid collection. Vascular: No hyperdense vessel. Skull: Negative for fracture or  focal lesion. Sinuses/Orbits: Mucosal thickening in the left sphenoid sinus. No acute finding in the orbits. Fluid in the pneumatized portion of the bilateral mastoid air cells, as well as in the left greater than right middle ear. IMPRESSION: 1. No acute intracranial process. 2. Fluid in the pneumatized portion of the bilateral mastoid air cells, as well as in the left greater than right middle ear. Correlate for symptoms of otomastoiditis or otitis media. Electronically Signed   By: Wiliam Ke M.D.   On: 08/30/2023 12:34    Procedures Procedures    Medications Ordered in ED Medications  LORazepam (ATIVAN) injection 2 mg (has no administration in time range)  magnesium sulfate IVPB 2 g 50 mL (0 g Intravenous Stopped 08/30/23 1343)  potassium chloride 10 mEq in 100 mL IVPB (0 mEq Intravenous Stopped 08/30/23 1343)  haloperidol lactate (HALDOL) injection 2.5 mg (2.5 mg Intravenous Given 08/30/23 1609)    ED Course/ Medical Decision Making/ A&P                                 Medical Decision Making Patient presenting with increased generalized weakness with increasing falls, family noting shuffling gait which has been gradually worsening, fell this morning and was unable to get up on his own or with his wife's assistance.  He was hypoxic prior to arrival, has a history of COPD, he has no wheezing, no rales on exam.  BNP is normal at 57, he does have hypomagnesemia and a mild hypokalemia.  Cxr negative for acute findings.  CT imaging negative for head injury from todays fall but questions otitis media vs otomastoiditis - unclear if this could be source of his falls, but prob not associated with the lower extremity weakness family has observed.     Amount and/or Complexity of Data Reviewed Labs: ordered.    Details: Delta troponin flat, but elevated,  no comparison troponins for comparison.  Pt denies cp.  K+ -3.2,  Mg - 1.3 Radiology: ordered.    Details: Hip neg fx, CT head per above.  Cxr clear ECG/medicine tests: ordered.    Details: Sinus tachy 115, occasional pvc new. Discussion of management or test interpretation with external provider(s): Pt discussed with Dr. Dareen Piano who agrees with admission.  Risk Decision regarding hospitalization.           Final Clinical Impression(s) / ED Diagnoses Final diagnoses:  Weakness  Hypomagnesemia  Hypokalemia  Fall, initial encounter    Rx / DC Orders ED Discharge Orders     None         Victoriano Lain 08/30/23 1648    Bethann Berkshire, MD 08/31/23 1625

## 2023-08-30 NOTE — Progress Notes (Signed)
Called to get report,Nurse not available at this time, in with another patient. Nurse will give floor a call when she is available.

## 2023-08-30 NOTE — ED Notes (Signed)
Requested ice chips for pt from provider. Awaiting response.

## 2023-08-30 NOTE — Progress Notes (Signed)
PHARMACIST - PHYSICIAN COMMUNICATION  CONCERNING:  Enoxaparin (Lovenox) for DVT Prophylaxis    RECOMMENDATION: Patient was prescribed enoxaparin 40mg  q24 hours for VTE prophylaxis.   Filed Weights   08/30/23 1006  Weight: 103.8 kg (228 lb 13.4 oz)    Body mass index is 33.79 kg/m.  Estimated Creatinine Clearance: 75.5 mL/min (by C-G formula based on SCr of 1.05 mg/dL).  Based on [facility] policy patient is candidate for enoxaparin 0.5mg /kg TBW SQ every 24 hours based on BMI being >30.  DESCRIPTION: Pharmacy has adjusted enoxaparin dose per [facility] policy.  Patient is now receiving enoxaparin 50 mg every 24 hours   Tressie Ellis 08/30/2023 5:13 PM

## 2023-08-30 NOTE — Progress Notes (Signed)
Spoke with MD Dareen Piano regarding heparin drip infusion ordered for AFIB. Upon arrival to unit patient in [facility] and report from ED RN stating patient was in [facility] for entire admission this far except for brief period of SVT. Per MD Dareen Piano holding Heparin drip at this time. Pt in NSR post IV lopressor.

## 2023-08-30 NOTE — Consult Note (Signed)
PHARMACY - ANTICOAGULATION CONSULT NOTE  Pharmacy Consult for IV Heparin Indication: atrial fibrillation  Patient Measurements: Height: 5\' 9"  (175.3 cm) Weight: 103.8 kg (228 lb 13.4 oz) IBW/kg (Calculated) : 70.7 Heparin Dosing Weight: 93 kg  Labs: Recent Labs    08/30/23 1128 08/30/23 1402  HGB 10.2*  --   HCT 32.4*  --   PLT 267  --   CREATININE 1.05  --   TROPONINIHS 108* 109*   Estimated Creatinine Clearance: 75.5 mL/min (by C-G formula based on SCr of 1.05 mg/dL).  Medical History: Past Medical History:  Diagnosis Date   CHF (congestive heart failure) (HCC)    COPD (chronic obstructive pulmonary disease) (HCC)    Dementia (HCC)    alcohol related   Diabetes (HCC)    History of kidney stones    Hypertension    Unsteady gait    Wernicke's encephalopathy    Medications:  No apparent anticoagulation prior to admission. Med reconciliation is pending  Assessment: 72 y/o M with medical history as above here with falls / weakness. Noted to be in Afib/flutter and pharmacy consulted to initiate and manage heparin infusion.  Baseline aPTT and INR are pending. Baseline H&H notable for mild anemia.   Goal of Therapy:  Heparin level 0.3-0.7 units/ml Monitor platelets by anticoagulation protocol: Yes   Plan:  --Heparin 4000 unit IV bolus followed by continuous infusion at 1400 units/hr --Heparin level 8 hours from initiation of infusion --Daily CBC per protocol while on IV heparin  Tressie Ellis 08/30/2023,5:50 PM

## 2023-08-30 NOTE — ED Notes (Signed)
ED TO INPATIENT HANDOFF REPORT  ED Nurse Name and Phone #: [phone]  S Name/Age/Gender Jeff Miles 72 y.o. male Room/Bed: APA04/APA04  Code Status   Code Status: Full Code  Home/SNF/Other In question Patient oriented to: self and place Is this baseline? No   Triage Complete: Triage complete  Chief Complaint AMS (altered mental status) [R41.82]  Triage Note Pt c/o frequent falls and difficulty walking the last couple of days  Family member states pt is dragging his left leg   Allergies No Known Allergies  Level of Care/Admitting Diagnosis ED Disposition     ED Disposition  Admit   Condition  --   Comment  Hospital Area: [facility] [[id number]]  Level of Care: Med-Surg [16]  Covid Evaluation: Asymptomatic - no recent exposure (last 10 days) testing not required  Diagnosis: AMS (altered mental status) [6213086]  Admitting Physician: Leeroy Bock [[id number]]  Attending Physician: Leeroy Bock [[id number]]  Certification:: I certify this patient will need inpatient services for at least 2 midnights  Expected Medical Readiness: 09/02/2023          B Medical/Surgery History Past Medical History:  Diagnosis Date   CHF (congestive heart failure) (HCC)    COPD (chronic obstructive pulmonary disease) (HCC)    Dementia (HCC)    alcohol related   Diabetes (HCC)    History of kidney stones    Hypertension    Unsteady gait    Wernicke's encephalopathy    Past Surgical History:  Procedure Laterality Date   BACK SURGERY     two   BIOPSY  06/28/2023   Procedure: BIOPSY;  Surgeon: Franky Macho, MD;  [facility];  Service: Endoscopy;;   COLONOSCOPY WITH PROPOFOL N/A 11/28/2018   Procedure: COLONOSCOPY WITH PROPOFOL;  Surgeon: West Bali, MD;  [facility];  Service: Endoscopy;  Laterality: N/A;  7:30am   COLONOSCOPY WITH PROPOFOL N/A 06/28/2023   Procedure: COLONOSCOPY WITH PROPOFOL;  Surgeon: Franky Macho,  MD;  [facility];  Service: Endoscopy;  Laterality: N/A;   ESOPHAGOGASTRODUODENOSCOPY (EGD) WITH PROPOFOL N/A 06/28/2023   Procedure: ESOPHAGOGASTRODUODENOSCOPY (EGD) WITH PROPOFOL;  Surgeon: Franky Macho, MD;  [facility];  Service: Endoscopy;  Laterality: N/A;   INNER EAR SURGERY     six surgery   MASS EXCISION N/A 10/08/2019   Procedure: EXCISION MASS ON TONGUE;  Surgeon: Newman Pies, MD;  Location: [facility];  Service: ENT;  Laterality: N/A;   NOSE SURGERY     x2   POLYPECTOMY  11/28/2018   Procedure: POLYPECTOMY;  Surgeon: West Bali, MD;  [facility];  Service: Endoscopy;;  cecal polyp, ascending polyps x4, polyp at hepatic flexure, descending colon polyps x4, rectal polyps x4     A IV Location/Drains/Wounds Patient Lines/Drains/Airways Status     Active Line/Drains/Airways     Name Placement date Placement time Site Days   Peripheral IV 08/30/23 18 G 1.88" Anterior;Right Forearm 08/30/23  1130  Forearm  less than 1   Urethral Catheter [name] Straight-tip 16 Fr. 08/30/23  1448  Straight-tip  less than 1            Intake/Output Last 24 hours  Intake/Output Summary (Last 24 hours) at 08/30/2023 1727 Last data filed at 08/30/2023 1453 Gross per 24 hour  Intake 150 ml  Output 1050 ml  Net -900 ml    Labs/Imaging Results for orders placed or performed during  the hospital encounter of 08/30/23 (from the past 48 hours)  CBG monitoring, ED     Status: Abnormal   Collection Time: 08/30/23 10:05 AM  Result Value Ref Range   Glucose-Capillary 123 (H) 70 - 99 mg/dL    Comment: Glucose reference range applies only to samples taken after fasting for at least 8 hours.  CBC     Status: Abnormal   Collection Time: 08/30/23 11:28 AM  Result Value Ref Range   WBC 10.8 (H) 4.0 - 10.5 K/uL   RBC 3.64 (L) 4.22 - 5.81 MIL/uL   Hemoglobin 10.2 (L) 13.0 - 17.0 g/dL   HCT 27.2 (L) 53.6 - 64.4 %   MCV 89.0 80.0 - 100.0 fL    MCH 28.0 26.0 - 34.0 pg   MCHC 31.5 30.0 - 36.0 g/dL   RDW 03.4 74.2 - 59.5 %   Platelets 267 150 - 400 K/uL   nRBC 0.0 0.0 - 0.2 %    Comment: Performed at [facility], [address] [place], [place] [address]  Magnesium     Status: Abnormal   Collection Time: 08/30/23 11:28 AM  Result Value Ref Range   Magnesium 1.3 (L) 1.7 - 2.4 mg/dL    Comment: Performed at [facility], [address], [place], [place] [address]  Comprehensive metabolic panel     Status: Abnormal   Collection Time: 08/30/23 11:28 AM  Result Value Ref Range   Sodium 137 135 - 145 mmol/L   Potassium 3.2 (L) 3.5 - 5.1 mmol/L   Chloride 99 98 - 111 mmol/L   CO2 28 22 - 32 mmol/L   Glucose, Bld 110 (H) 70 - 99 mg/dL    Comment: Glucose reference range applies only to samples taken after fasting for at least 8 hours.   BUN 13 8 - 23 mg/dL   Creatinine, Ser 9.51 0.61 - 1.24 mg/dL   Calcium 9.3 8.9 - 88.4 mg/dL   Total Protein 7.2 6.5 - 8.1 g/dL   Albumin 3.6 3.5 - 5.0 g/dL   AST 56 (H) 15 - 41 U/L   ALT 21 0 - 44 U/L   Alkaline Phosphatase 73 38 - 126 U/L   Total Bilirubin 0.9 <1.2 mg/dL   GFR, Estimated >16 >60 mL/min    Comment: (NOTE) Calculated using the CKD-EPI Creatinine Equation (2021)    Anion gap 10 5 - 15    Comment: Performed at [facility], [address], [place], [place] [address]  Troponin I (High Sensitivity)     Status: Abnormal   Collection Time: 08/30/23 11:28 AM  Result Value Ref Range   Troponin I (High Sensitivity) 108 (HH) <18 ng/L    Comment: CRITICAL RESULT CALLED TO, READ BACK BY AND VERIFIED WITH WHITE,M  AT 12:10PM ON 08/30/23 BY FESTERMAN,C (NOTE) Elevated high sensitivity troponin I (hsTnI) values and significant  changes across serial measurements may suggest ACS but many other  chronic and acute conditions are known to elevate hsTnI results.  Refer to the "Links" section for chest pain algorithms and additional  guidance. Performed at [facility], [address]  [place], [place] [address]   Brain natriuretic peptide     Status: None   Collection Time: 08/30/23 11:28 AM  Result Value Ref Range   B Natriuretic Peptide 57.0 0.0 - 100.0 pg/mL    Comment: Performed at [facility], [address] [place], [place] [address]  Troponin I (High Sensitivity)     Status: Abnormal  Collection Time: 08/30/23  2:02 PM  Result Value Ref Range   Troponin I (High Sensitivity) 109 (HH) <18 ng/L    Comment: CRITICAL VALUE NOTED.  VALUE IS CONSISTENT WITH PREVIOUSLY REPORTED AND CALLED VALUE. (NOTE) Elevated high sensitivity troponin I (hsTnI) values and significant  changes across serial measurements may suggest ACS but many other  chronic and acute conditions are known to elevate hsTnI results.  Refer to the "Links" section for chest pain algorithms and additional  guidance. Performed at [facility], [address], [place], [place] [address]   Urinalysis, Routine w reflex microscopic -Urine, Clean Catch     Status: Abnormal   Collection Time: 08/30/23  2:43 PM  Result Value Ref Range   Color, Urine YELLOW YELLOW   APPearance CLEAR CLEAR   Specific Gravity, Urine 1.008 1.005 - 1.030   pH 7.0 5.0 - 8.0   Glucose, UA NEGATIVE NEGATIVE mg/dL   Hgb urine dipstick MODERATE (A) NEGATIVE   Bilirubin Urine NEGATIVE NEGATIVE   Ketones, ur 20 (A) NEGATIVE mg/dL   Protein, ur 191 (A) NEGATIVE mg/dL   Nitrite NEGATIVE NEGATIVE   Leukocytes,Ua NEGATIVE NEGATIVE   RBC / HPF 0-5 0 - 5 RBC/hpf   WBC, UA 0-5 0 - 5 WBC/hpf   Bacteria, UA RARE (A) NONE SEEN   Squamous Epithelial / HPF 0-5 0 - 5 /HPF    Comment: Performed at [facility], [address], [place], [place] [address]   DG HIPS BILAT WITH PELVIS MIN 5 VIEWS Result Date: 08/30/2023 CLINICAL DATA:  [id number] Fall [id number].  Difficulty walking. EXAM: DG HIP (WITH OR WITHOUT PELVIS) 5+V BILAT COMPARISON:  None Available. FINDINGS: Pelvis is intact with normal and symmetric sacroiliac joints. No acute fracture or  dislocation. No aggressive osseous lesion. Probable old/healed right greater trochanter avulsion fracture noted. Visualized sacral arcuate lines are unremarkable. There are changes of chronic pubic symphisitis. There are mild degenerative changes of bilateral hip joints without significant joint space narrowing. Osteophytosis of the superior acetabulum. No radiopaque foreign bodies. IMPRESSION: *No acute osseous abnormality of the pelvis or bilateral hip joints. Electronically Signed   By: Jules Schick M.D.   On: 08/30/2023 14:08   DG Chest 1 View Result Date: 08/30/2023 CLINICAL DATA:  Weakness.  Frequent falls and difficulty walking. EXAM: CHEST  1 VIEW COMPARISON:  11/03/2015. FINDINGS: Bilateral lung fields are clear. Bilateral costophrenic angles are clear. Normal cardio-mediastinal silhouette. No acute osseous abnormalities. The soft tissues are within normal limits. IMPRESSION: No active disease. Electronically Signed   By: Jules Schick M.D.   On: 08/30/2023 14:06   CT Head Wo Contrast Result Date: 08/30/2023 CLINICAL DATA:  Frequent falls and difficulty walking EXAM: CT HEAD WITHOUT CONTRAST TECHNIQUE: Contiguous axial images were obtained from the base of the skull through the vertex without intravenous contrast. RADIATION DOSE REDUCTION: This exam was performed according to the departmental dose-optimization program which includes automated exposure control, adjustment of the mA and/or kV according to patient size and/or use of iterative reconstruction technique. COMPARISON:  None Available. FINDINGS: Brain: No evidence of acute infarction, hemorrhage, mass, mass effect, or midline shift. No hydrocephalus or extra-axial fluid collection. Vascular: No hyperdense vessel. Skull: Negative for fracture or focal lesion. Sinuses/Orbits: Mucosal thickening in the left sphenoid sinus. No acute finding in the orbits. Fluid in the pneumatized portion of the bilateral mastoid air cells, as well as in the  left greater than right middle ear. IMPRESSION: 1. No acute intracranial process. 2. Fluid  in the pneumatized portion of the bilateral mastoid air cells, as well as in the left greater than right middle ear. Correlate for symptoms of otomastoiditis or otitis media. Electronically Signed   By: Wiliam Ke M.D.   On: 08/30/2023 12:34    Pending Labs Unresulted Labs (From admission, onward)     Start     Ordered   09/06/23 0500  Creatinine, serum  (enoxaparin (LOVENOX)    CrCl >/= 30 ml/min)  Weekly,   R     Comments: while on enoxaparin therapy    08/30/23 1712   08/31/23 0500  Basic metabolic panel  Tomorrow morning,   R        08/30/23 1712   08/31/23 0500  CBC  Tomorrow morning,   R        08/30/23 1712   08/30/23 1647  Ethanol  Add-on,   AD        08/30/23 1646            Vitals/Pain Today's Vitals   08/30/23 1330 08/30/23 1345 08/30/23 1400 08/30/23 1535  BP: (!) 148/73 (!) 148/63 (!) 147/68   Pulse: (!) 102 98 100   Resp: 18 16 19    Temp:    97.6 F (36.4 C)  TempSrc:    Oral  SpO2: 96% 95% 94%   Weight:      Height:      PainSc:        Isolation Precautions No active isolations  Medications Medications  LORazepam (ATIVAN) 2 MG/ML injection (  Not Given 08/30/23 1648)  acetaminophen (TYLENOL) tablet 650 mg (has no administration in time range)    Or  acetaminophen (TYLENOL) suppository 650 mg (has no administration in time range)  polyethylene glycol (MIRALAX / GLYCOLAX) packet 17 g (has no administration in time range)  bisacodyl (DULCOLAX) EC tablet 5 mg (has no administration in time range)  enoxaparin (LOVENOX) injection 50 mg (has no administration in time range)  magnesium sulfate IVPB 2 g 50 mL (0 g Intravenous Stopped 08/30/23 1343)  potassium chloride 10 mEq in 100 mL IVPB (0 mEq Intravenous Stopped 08/30/23 1343)  haloperidol lactate (HALDOL) injection 2.5 mg (2.5 mg Intravenous Given 08/30/23 1609)  LORazepam (ATIVAN) injection 2 mg (2 mg  Intravenous Given 08/30/23 1645)  metoprolol tartrate (LOPRESSOR) injection 5 mg (5 mg Intravenous Given 08/30/23 1721)    Mobility non-ambulatory     Focused Assessments    R Recommendations: See Admitting Provider Note  Report given to:   Additional Notes: call for report. A lot of information

## 2023-08-30 NOTE — ED Triage Notes (Signed)
Pt c/o frequent falls and difficulty walking the last couple of days  Family member states pt is dragging his left leg

## 2023-08-31 ENCOUNTER — Other Ambulatory Visit (HOSPITAL_COMMUNITY): Payer: Medicare HMO

## 2023-08-31 ENCOUNTER — Inpatient Hospital Stay (HOSPITAL_COMMUNITY): Payer: Medicare HMO

## 2023-08-31 ENCOUNTER — Encounter (HOSPITAL_COMMUNITY): Payer: Self-pay | Admitting: Student in an Organized Health Care Education/Training Program

## 2023-08-31 ENCOUNTER — Other Ambulatory Visit (HOSPITAL_COMMUNITY): Payer: Self-pay | Admitting: *Deleted

## 2023-08-31 DIAGNOSIS — K219 Gastro-esophageal reflux disease without esophagitis: Secondary | ICD-10-CM

## 2023-08-31 DIAGNOSIS — L8990 Pressure ulcer of unspecified site, unspecified stage: Secondary | ICD-10-CM | POA: Insufficient documentation

## 2023-08-31 DIAGNOSIS — E119 Type 2 diabetes mellitus without complications: Secondary | ICD-10-CM

## 2023-08-31 DIAGNOSIS — R4182 Altered mental status, unspecified: Secondary | ICD-10-CM

## 2023-08-31 DIAGNOSIS — E876 Hypokalemia: Secondary | ICD-10-CM | POA: Diagnosis not present

## 2023-08-31 LAB — BASIC METABOLIC PANEL
Anion gap: 13 (ref 5–15)
BUN: 9 mg/dL (ref 8–23)
CO2: 25 mmol/L (ref 22–32)
Calcium: 9.1 mg/dL (ref 8.9–10.3)
Chloride: 103 mmol/L (ref 98–111)
Creatinine, Ser: 0.83 mg/dL (ref 0.61–1.24)
GFR, Estimated: >60 mL/min (ref >60)
Glucose, Bld: 124 mg/dL — ABNORMAL HIGH (ref 70–99)
Potassium: 3.1 mmol/L — ABNORMAL LOW (ref 3.5–5.1)
Sodium: 141 mmol/L (ref 135–145)

## 2023-08-31 LAB — IRON AND TIBC
Iron: 14 ug/dL — ABNORMAL LOW (ref 45–182)
Saturation Ratios: 3 % — ABNORMAL LOW (ref 17.9–39.5)
TIBC: 469 ug/dL — ABNORMAL HIGH (ref 250–450)
UIBC: 455 ug/dL

## 2023-08-31 LAB — CBC
HCT: 29.4 % — ABNORMAL LOW (ref 39.0–52.0)
Hemoglobin: 9.2 g/dL — ABNORMAL LOW (ref 13.0–17.0)
MCH: 28.4 pg (ref 26.0–34.0)
MCHC: 31.3 g/dL (ref 30.0–36.0)
MCV: 90.7 fL (ref 80.0–100.0)
Platelets: 230 10*3/uL (ref 150–400)
RBC: 3.24 MIL/uL — ABNORMAL LOW (ref 4.22–5.81)
RDW: 14.4 % (ref 11.5–15.5)
WBC: 8.1 10*3/uL (ref 4.0–10.5)
nRBC: 0 % (ref 0.0–0.2)

## 2023-08-31 LAB — GLUCOSE, CAPILLARY
Glucose-Capillary: 113 mg/dL — ABNORMAL HIGH (ref 70–99)
Glucose-Capillary: 128 mg/dL — ABNORMAL HIGH (ref 70–99)
Glucose-Capillary: 150 mg/dL — ABNORMAL HIGH (ref 70–99)
Glucose-Capillary: 118 mg/dL — ABNORMAL HIGH (ref 70–99)

## 2023-08-31 LAB — VITAMIN B12: Vitamin B-12: 723 pg/mL (ref 180–914)

## 2023-08-31 LAB — RETICULOCYTES
Immature Retic Fract: 26.5 % — ABNORMAL HIGH (ref 2.3–15.9)
RBC.: 3.29 MIL/uL — ABNORMAL LOW (ref 4.22–5.81)
Retic Count, Absolute: 84.9 10*3/uL (ref 19.0–186.0)
Retic Ct Pct: 2.6 % (ref 0.4–3.1)

## 2023-08-31 LAB — FERRITIN: Ferritin: 19 ng/mL — ABNORMAL LOW (ref 24–336)

## 2023-08-31 MED ORDER — LORAZEPAM 2 MG/ML IJ SOLN
0.5000 mg | Freq: Once | INTRAMUSCULAR | Status: AC
  Administered 2023-08-31: 0.5 mg via INTRAVENOUS
  Filled 2023-08-31: qty 1

## 2023-08-31 MED ORDER — GADOBUTROL 1 MMOL/ML IV SOLN
10.0000 mL | Freq: Once | INTRAVENOUS | Status: AC | PRN
  Administered 2023-08-31: 10 mL via INTRAVENOUS

## 2023-08-31 MED ORDER — METOPROLOL TARTRATE 25 MG PO TABS
25.0000 mg | ORAL_TABLET | Freq: Two times a day (BID) | ORAL | Status: DC
  Administered 2023-08-31 – 2023-09-01 (×4): 25 mg via ORAL
  Filled 2023-08-31 (×4): qty 1

## 2023-08-31 MED ORDER — POTASSIUM CHLORIDE CRYS ER 20 MEQ PO TBCR
20.0000 meq | EXTENDED_RELEASE_TABLET | Freq: Once | ORAL | Status: AC
  Administered 2023-08-31: 20 meq via ORAL
  Filled 2023-08-31: qty 1

## 2023-08-31 NOTE — Evaluation (Signed)
Physical Therapy Evaluation Patient Details Name: Jeff Miles MRN: [id number] DOB: 01-Feb-1951 Today's Date: 08/31/2023  History of Present Illness  Jeff Miles is a 72 y.o. male with a PMH significant for dementia, COPD, HTN, CHF, anemia, type II DM, anxiety, GERD.  At baseline, they live at home with his wife and are ambulatory but moderate dementia.     They presented from home to the ED on 08/30/2023 with AMS. Difficult to pinpoint onset due to reported fluctuation of baseline mental status from family members due to baseline dementia and Korsakoff but overall report about 3 days of worsening confusion. He also had a fall today which resulted in a head injury. He had been ambulating abnormally for the three days with right leg dragging behind left and moving at slower pace.   They describe urinary hesitancy and are unsure of his UOP volume since he goes to the bathroom independently. He has been eating normally and not complaining of acute pain, nausea, vomiting.     Patient is unable to provide his own history.   He has chronic tongue fasciculations but this has been worse since today.   Clinical Impression  Patient demonstrates labored movement for sitting up at bedside, once seated c/o mild dizziness that did no worsen after standing, had to lean on nearby objects for support when taking steps without AD, required use of RW for safety with good return for use demonstrated and limited for ambulation mostly due to fatigue.  Patient tolerated sitting up in chair after therapy - nursing staff notified.  Patient will benefit from continued skilled physical therapy in hospital and recommended venue below to increase strength, balance, endurance for safe ADLs and gait.          If plan is discharge home, recommend the following: A little help with walking and/or transfers;A little help with bathing/dressing/bathroom;Help with stairs or ramp for entrance;Assistance with cooking/housework    Can travel by private vehicle        Equipment Recommendations Rolling walker (2 wheels)  Recommendations for Other Services       Functional Status Assessment Patient has had a recent decline in their functional status and demonstrates the ability to make significant improvements in function in a reasonable and predictable amount of time.     Precautions / Restrictions Precautions Precautions: Fall Restrictions Weight Bearing Restrictions Per Provider Order: No      Mobility  Bed Mobility Overal bed mobility: Needs Assistance Bed Mobility: Supine to Sit     Supine to sit: Supervision     General bed mobility comments: HOB slightly elevated.    Transfers Overall transfer level: Needs assistance Equipment used: Rolling walker (2 wheels) Transfers: Sit to/from Stand, Bed to chair/wheelchair/BSC Sit to Stand: Contact guard assist   Step pivot transfers: Contact guard assist, Min assist       General transfer comment: labored unsteady movement,    Ambulation/Gait Ambulation/Gait assistance: Contact guard assist, Min assist Gait Distance (Feet): 75 Feet Assistive device: Rolling walker (2 wheels) Gait Pattern/deviations: Decreased step length - left, Decreased stance time - right, Decreased stride length Gait velocity: decreased     General Gait Details: unsteady labored movement having to lean on nearby objects for support when not using an AD, required use of RW for safety and tolerated ambulating in room/hallway without loss of balance, limited mostly due to fatigue, on room air with SpO2 at 92%  Stairs  Wheelchair Mobility     Tilt Bed    Modified Rankin (Stroke Patients Only)       Balance Overall balance assessment: Needs assistance Sitting-balance support: Feet supported, No upper extremity supported Sitting balance-Leahy Scale: Fair Sitting balance - Comments: fair/good seated at EOB   Standing balance support: During  functional activity, No upper extremity supported Standing balance-Leahy Scale: Fair Standing balance comment: fair/poor without AD, fair/good using RW                             Pertinent Vitals/Pain Pain Assessment Pain Assessment: No/denies pain    Home Living Family/patient expects to be discharged to:: Private residence Living Arrangements: Spouse/significant other Available Help at Discharge: Family;Available 24 hours/day Type of Home: House Home Access: Stairs to enter Entrance Stairs-Rails: None Entrance Stairs-Number of Steps: 1   Home Layout: One level Home Equipment:  (Might have a RW at home.) Additional Comments: Patient is poor historian    Prior Function Prior Level of Function : Needs assist       Physical Assist : Mobility (physical);ADLs (physical) Mobility (physical): Bed mobility;Transfers;Gait;Stairs   Mobility Comments: Pt reports independent community mobility without AD. ADLs Comments: Pt reports independent ADL's     Extremity/Trunk Assessment   Upper Extremity Assessment Upper Extremity Assessment: Defer to OT evaluation    Lower Extremity Assessment Lower Extremity Assessment: Generalized weakness    Cervical / Trunk Assessment Cervical / Trunk Assessment: Kyphotic  Communication   Communication Communication: Hearing impairment Cueing Techniques: Verbal cues;Tactile cues;Gestural cues  Cognition Arousal: Alert Behavior During Therapy: WFL for tasks assessed/performed Overall Cognitive Status: History of cognitive impairments - at baseline                                          General Comments      Exercises     Assessment/Plan    PT Assessment Patient needs continued PT services  PT Problem List Decreased strength;Decreased activity tolerance;Decreased balance;Decreased mobility       PT Treatment Interventions DME instruction;Gait training;Stair training;Therapeutic activities;Functional  mobility training;Therapeutic exercise;Balance training;Patient/family education    PT Goals (Current goals can be found in the Care Plan section)  Acute Rehab PT Goals Patient Stated Goal: return home with family to assist PT Goal Formulation: With patient Time For Goal Achievement: 09/07/23 Potential to Achieve Goals: Good    Frequency Min 3X/week     Co-evaluation PT/OT/SLP Co-Evaluation/Treatment: Yes Reason for Co-Treatment: To address functional/ADL transfers PT goals addressed during session: Mobility/safety with mobility;Balance;Proper use of DME OT goals addressed during session: ADL's and self-care       AM-PAC PT "6 Clicks" Mobility  Outcome Measure Help needed turning from your back to your side while in a flat bed without using bedrails?: None Help needed moving from lying on your back to sitting on the side of a flat bed without using bedrails?: A Little Help needed moving to and from a bed to a chair (including a wheelchair)?: A Little Help needed standing up from a chair using your arms (e.g., wheelchair or bedside chair)?: A Little Help needed to walk in hospital room?: A Little Help needed climbing 3-5 steps with a railing? : A Lot 6 Click Score: 18    End of Session   Activity Tolerance: Patient tolerated treatment well;Patient limited by fatigue Patient  left: in chair;with call bell/phone within reach;with chair alarm set Nurse Communication: Mobility status PT Visit Diagnosis: Unsteadiness on feet (R26.81);Other abnormalities of gait and mobility (R26.89);Muscle weakness (generalized) (M62.81)    Time: 0940-1003 PT Time Calculation (min) (ACUTE ONLY): 23 min   Charges:   PT Evaluation $PT Eval Moderate Complexity: 1 Mod PT Treatments $Therapeutic Activity: 23-37 mins PT General Charges $$ ACUTE PT VISIT: 1 Visit         12:33 PM, 08/31/23 Ocie Bob, MPT Physical Therapist with [facility] [phone] office [phone]  mobile phone

## 2023-08-31 NOTE — Hospital Course (Signed)
72 y.o. male with a PMH significant for dementia, COPD, HTN, CHF, anemia, type II DM, anxiety, GERD.  At baseline, they live at home with his wife and are ambulatory but moderate dementia.   They presented from home to the ED on 08/30/2023 with AMS. Difficult to pinpoint onset due to reported fluctuation of baseline mental status from family members due to baseline dementia and Korsakoff but overall report about 3 days of worsening confusion. He also had a fall today which resulted in a head injury. He had been ambulating abnormally for the three days with right leg dragging behind left and moving at slower pace.   They describe urinary hesitancy and are unsure of his UOP volume since he goes to the bathroom independently. He has been eating normally and not complaining of acute pain, nausea, vomiting.   Patient is unable to provide his own history.  He has chronic tongue fasciculations but this has been worse since today.   In the ED, it was found that they had significant disorientation and agitation. He also had elevated HR to 140s which appeared to be afib/flutter on bedside ECG. After 2 doses of lopressor IV, his repeat ECG showed sinus rhythm at 98 HR. Had urinary retention and required foley placed which resulted in 1453cc UOP.  Also noted to have temp of 101.5 and oxygen requirement of 3L [place] due to desat to 88% on room air. Blood pressures significantly elevated to 180s systolics which improved to 140s with lopressor.  Other significant findings included: WBC 10.8, hemoglobin 10.2, platelets 267.  Mg++ 1.3, K+ 3.2.  Troponin 108>109.  BNP 57.  Urinalysis positive for moderate hemoglobin, ketones, 100 protein, rare bacteria, negative nitrites and leukocytes. Chest xray negative for active disease.  Hip xray: negative for fracture Head CT: negative acute intracranial abnormality    They were initially treated with potassium, magnesium, haldol, and ativan in the ED.   Patient was admitted to medicine  service for further workup and management of AMS as outlined in detail below.

## 2023-08-31 NOTE — TOC Initial Note (Signed)
Transition of Care [facility]) - Initial/Assessment Note    Patient Details  Name: Jeff Miles MRN: [id number] Date of Birth: 03-16-1951  Transition of Care [facility]) CM/SW Contact:    Karn Cassis, LCSW Phone Number: 08/31/2023, 1:00 PM  Clinical Narrative:  Pt admitted due to acute metabolic encephalopathy. Pt's wife reports pt is fairly independent with ADLs at baseline. They have a RW at home if needed. PT/OT evaluated pt and recommend home health. Discussed with wife who is agreeable and no preference on agency. Referred and accepted by [facility] with Frances Furbish. Will need HHPT/OT orders. TOC will follow.                           Expected Discharge Plan: Home w Home Health Services Barriers to Discharge: Continued Medical Work up   Patient Goals and CMS Choice Patient states their goals for this hospitalization and ongoing recovery are:: return home   Choice offered to / list presented to : Spouse [place] ownership interest in [facility].provided to::  (n/a)    Expected Discharge Plan and Services In-house Referral: Clinical Social Work   Post Acute Care Choice: Home Health Living arrangements for the past 2 months: Single Family Home                           HH Arranged: PT, OT HH Agency: [facility] Date [facility] Agency Contacted: 08/31/23 Time HH Agency Contacted: 1300 Representative spoke with at [facility] Agency: Kandee Keen  Prior Living Arrangements/Services Living arrangements for the past 2 months: Single Family Home Lives with:: Spouse Patient language and need for interpreter reviewed:: Yes Do you feel safe going back to the place where you live?: Yes      Need for Family Participation in Patient Care: Yes (Comment) Care giver support system in place?: Yes (comment) Current home services: DME (walker) Criminal Activity/Legal Involvement Pertinent to Current Situation/Hospitalization: No - Comment as needed  Activities of Daily Living   ADL  Screening (condition at time of admission) Independently performs ADLs?: No Does the patient have a NEW difficulty with bathing/dressing/toileting/self-feeding that is expected to last >3 days?: Yes (Initiates electronic notice to provider for possible OT consult) Does the patient have a NEW difficulty with getting in/out of bed, walking, or climbing stairs that is expected to last >3 days?: Yes (Initiates electronic notice to provider for possible PT consult) Does the patient have a NEW difficulty with communication that is expected to last >3 days?: Yes (Initiates electronic notice to provider for possible SLP consult) Is the patient deaf or have difficulty hearing?: No Does the patient have difficulty seeing, even when wearing glasses/contacts?: No Does the patient have difficulty concentrating, remembering, or making decisions?: Yes  Permission Sought/Granted                  Emotional Assessment         Alcohol / Substance Use: Not Applicable Psych Involvement: No (comment)  Admission diagnosis:  Hypokalemia [E87.6] Hypomagnesemia [E83.42] Weakness [R53.1] Fall, initial encounter [W19.XXXA] AMS (altered mental status) [R41.82] Patient Active Problem List   Diagnosis Date Noted   AMS (altered mental status) 08/30/2023   Acute metabolic encephalopathy 08/30/2023   Weakness 08/30/2023   Fall 08/30/2023   Hypokalemia 08/30/2023   Gastric polyp 06/28/2023   Gastritis and gastroduodenitis 06/28/2023   Grade II hemorrhoids 06/28/2023   Diabetes mellitus type 2 in  nonobese (HCC) 06/27/2023   Anxiety 06/27/2023   GERD (gastroesophageal reflux disease) 06/27/2023   Hypomagnesemia 06/27/2023   Hematochezia 06/27/2023   GI bleed 06/26/2023   Iron deficiency anemia, unspecified 04/09/2022   Iron deficiency 04/07/2022   Rectal bleeding 08/31/2018   Heme + stool 08/31/2018   Abnormal LFTs 08/31/2018   Aspiration into airway    Acute on chronic diastolic (congestive) heart  failure (HCC)    Restlessness and agitation    Alcohol withdrawal (HCC) 10/25/2015   Bronchospasm 10/24/2015   COPD (chronic obstructive pulmonary disease) (HCC) 10/24/2015   Hypoxemia 10/24/2015   URI (upper respiratory infection) 10/24/2015   Hypertensive urgency 10/24/2015   PCP:  Benita Stabile, MD Pharmacy:   [facility] [address], [place] - [address] [place] [address] Phone: [phone] Fax: [phone]  [facility] - [place], [place] - [address] [place] [address] Phone: [phone] Fax: [phone]     Social Drivers of Health (SDOH) Social History: SDOH Screenings   Food Insecurity: No Food Insecurity (08/30/2023)  Housing: Low Risk  (08/30/2023)  Transportation Needs: No Transportation Needs (08/30/2023)  Utilities: Not At Risk (08/30/2023)  Tobacco Use: Medium Risk (08/30/2023)   SDOH Interventions:     Readmission Risk Interventions     No data to display

## 2023-08-31 NOTE — Progress Notes (Signed)
PROGRESS NOTE   Jeff Miles  ZOX:[id number] DOB: Mar 21, 1951 DOA: 08/30/2023 PCP: Benita Stabile, MD   Chief Complaint  Patient presents with   Fall   Level of care: Stepdown  Brief Admission History:  72 y.o. male with a PMH significant for dementia, COPD, HTN, CHF, anemia, type II DM, anxiety, GERD.  At baseline, they live at home with his wife and are ambulatory but moderate dementia.   They presented from home to the ED on 08/30/2023 with AMS. Difficult to pinpoint onset due to reported fluctuation of baseline mental status from family members due to baseline dementia and Korsakoff but overall report about 3 days of worsening confusion. He also had a fall today which resulted in a head injury. He had been ambulating abnormally for the three days with right leg dragging behind left and moving at slower pace.   They describe urinary hesitancy and are unsure of his UOP volume since he goes to the bathroom independently. He has been eating normally and not complaining of acute pain, nausea, vomiting.   Patient is unable to provide his own history.  He has chronic tongue fasciculations but this has been worse since today.   In the ED, it was found that they had significant disorientation and agitation. He also had elevated HR to 140s which appeared to be afib/flutter on bedside ECG. After 2 doses of lopressor IV, his repeat ECG showed sinus rhythm at 98 HR. Had urinary retention and required foley placed which resulted in 1453cc UOP.  Also noted to have temp of 101.5 and oxygen requirement of 3L [place] due to desat to 88% on room air. Blood pressures significantly elevated to 180s systolics which improved to 140s with lopressor.  Other significant findings included: WBC 10.8, hemoglobin 10.2, platelets 267.  Mg++ 1.3, K+ 3.2.  Troponin 108>109.  BNP 57.  Urinalysis positive for moderate hemoglobin, ketones, 100 protein, rare bacteria, negative nitrites and leukocytes. Chest xray negative for active  disease.  Hip xray: negative for fracture Head CT: negative acute intracranial abnormality    They were initially treated with potassium, magnesium, haldol, and ativan in the ED.   Patient was admitted to medicine service for further workup and management of AMS as outlined in detail below.   Assessment and Plan:  Acute metabolic encephalopathy - MRI brain completed today and negative for acute findings - mentation appears to be improving to baseline - continue neuro checks for now - resume home behavioral health medications - suspect this may be due to his underlying dementia  Acute urinary retention  - continue foley cath for another 24 hours - consider removing foley on 12/19 and doing void trial - continue tamsulosin daily   Atrial flutter with RVR - he converted after several doses of IV lopressor - starting metoprolol 25 mg BID with hold parameters added - his CHADS-VASc score is at least 4  - anticoagulation held due to ongoing GI workup for GI bleeding, he is anemic now and reporting paper hematochezia, Hg trending down from recent tests; he needs colonoscopy (last one in Oct 24 prep was extremely poor)  Hypokalemia - oral replacement ordered - recheck in AM   Microcytic anemia - checking anemia panel for iron deficiency from chronic blood loss   DVT prophylaxis: enoxaparin  Code Status: full  Family Communication: not present during rounds Disposition: TBD    Consultants:   Procedures:   Antimicrobials:     Subjective: Pt reports he is hard of  hearing.  He is aware he is in hospital. He denies any specific complaints.   Objective: Vitals:   08/31/23 1148 08/31/23 1200 08/31/23 1257 08/31/23 1619  BP:  (!) 157/80 (!) 157/80   Pulse:  (!) 109 89   Resp:  (!) 24    Temp: 98.7 F (37.1 C)   98.3 F (36.8 C)  TempSrc: Oral   Oral  SpO2:  98%    Weight:      Height:        Intake/Output Summary (Last 24 hours) at 08/31/2023 1707 Last data filed at  08/31/2023 1528 Gross per 24 hour  Intake 300.42 ml  Output 1375 ml  Net -1074.58 ml   Filed Weights   08/30/23 1006 08/30/23 1800  Weight: 103.8 kg 98.9 kg   Examination:  General exam: Appears calm and comfortable. He is very hard of hearing.  He answers questions appropriately.  Respiratory system: Clear to auscultation. Respiratory effort normal. Cardiovascular system: normal S1 & S2 heard. No JVD, murmurs, rubs, gallops or clicks. No pedal edema. Gastrointestinal system: Abdomen is nondistended, soft and nontender. No organomegaly or masses felt. Normal bowel sounds heard. Central nervous system: Alert and oriented to person and place only. No focal neurological deficits. Extremities: Symmetric 5 x 5 power. Skin: No rashes, lesions or ulcers. Psychiatry: Judgement and insight appear UTD. Mood & affect appropriate.   Data Reviewed: I have personally reviewed following labs and imaging studies  CBC: Recent Labs  Lab 08/30/23 1128 08/31/23 0428  WBC 10.8* 8.1  HGB 10.2* 9.2*  HCT 32.4* 29.4*  MCV 89.0 90.7  PLT 267 230    Basic Metabolic Panel: Recent Labs  Lab 08/30/23 1128 08/31/23 0428  NA 137 141  K 3.2* 3.1*  CL 99 103  CO2 28 25  GLUCOSE 110* 124*  BUN 13 9  CREATININE 1.05 0.83  CALCIUM 9.3 9.1  MG 1.3*  --     CBG: Recent Labs  Lab 08/30/23 1005 08/31/23 0749 08/31/23 1151 08/31/23 1605  GLUCAP 123* 113* 128* 150*    Recent Results (from the past 240 hours)  MRSA Next Gen by PCR, Nasal     Status: None   Collection Time: 08/30/23  6:15 PM   Specimen: Nasal Mucosa; Nasal Swab  Result Value Ref Range Status   MRSA by PCR Next Gen NOT DETECTED NOT DETECTED Final    Comment: (NOTE) The GeneXpert MRSA Assay (FDA approved for NASAL specimens only), is one component of a comprehensive MRSA colonization surveillance program. It is not intended to diagnose MRSA infection nor to guide or monitor treatment for MRSA infections. Test performance is  not FDA approved in patients less than 32 years old. Performed at [facility], [address], [place], [place] [address]   Culture, blood (Routine X 2) w Reflex to ID Panel     Status: None (Preliminary result)   Collection Time: 08/30/23  8:00 PM   Specimen: BLOOD  Result Value Ref Range Status   Specimen Description BLOOD BLOOD LEFT HAND  Final   Special Requests   Final    BOTTLES DRAWN AEROBIC AND ANAEROBIC Blood Culture adequate volume   Culture   Final    NO GROWTH < 12 HOURS Performed at [facility], [address] [place], [place] [address]    Report Status PENDING  Incomplete  Culture, blood (Routine X 2) w Reflex to ID Panel     Status: None (Preliminary result)   Collection Time:  08/30/23  8:02 PM   Specimen: BLOOD  Result Value Ref Range Status   Specimen Description BLOOD BLOOD LEFT HAND  Final   Special Requests   Final    BOTTLES DRAWN AEROBIC AND ANAEROBIC Blood Culture adequate volume   Culture   Final    NO GROWTH < 12 HOURS Performed at [facility], [address], [place], [place] [address]    Report Status PENDING  Incomplete     Radiology Studies: MR BRAIN W WO CONTRAST Result Date: 08/31/2023 CLINICAL DATA:  Stroke suspected EXAM: MRI HEAD WITHOUT AND WITH CONTRAST TECHNIQUE: Multiplanar, multiecho pulse sequences of the brain and surrounding structures were obtained without and with intravenous contrast. CONTRAST:  10mL GADAVIST GADOBUTROL 1 MMOL/ML IV SOLN COMPARISON:  None Available. FINDINGS: Brain: No restricted diffusion to suggest acute or subacute infarct. No abnormal parenchymal or meningeal enhancement. No acute hemorrhage, mass, mass effect, or midline shift. No hydrocephalus or extra-axial collection. Pituitary and craniocervical junction within normal limits. No hemosiderin deposition to suggest remote hemorrhage. Scattered T2 hyperintense signal in the periventricular white matter, likely the sequela of mild-to-moderate chronic small vessel  ischemic disease. Mildly advanced central volume loss for age. Vascular: Normal arterial flow voids. Normal arterial and venous enhancement. Skull and upper cervical spine: Normal marrow signal. Sinuses/Orbits: Mucosal thickening in the ethmoid air cells and left sphenoid sinus. No acute finding in the orbits. Other: The mastoid air cells are well aerated. IMPRESSION: No acute intracranial process. No evidence of acute or subacute infarct. Electronically Signed   By: Wiliam Ke M.D.   On: 08/31/2023 11:34   DG HIPS BILAT WITH PELVIS MIN 5 VIEWS Result Date: 08/30/2023 CLINICAL DATA:  [id number] Fall [id number].  Difficulty walking. EXAM: DG HIP (WITH OR WITHOUT PELVIS) 5+V BILAT COMPARISON:  None Available. FINDINGS: Pelvis is intact with normal and symmetric sacroiliac joints. No acute fracture or dislocation. No aggressive osseous lesion. Probable old/healed right greater trochanter avulsion fracture noted. Visualized sacral arcuate lines are unremarkable. There are changes of chronic pubic symphisitis. There are mild degenerative changes of bilateral hip joints without significant joint space narrowing. Osteophytosis of the superior acetabulum. No radiopaque foreign bodies. IMPRESSION: *No acute osseous abnormality of the pelvis or bilateral hip joints. Electronically Signed   By: Jules Schick M.D.   On: 08/30/2023 14:08   DG Chest 1 View Result Date: 08/30/2023 CLINICAL DATA:  Weakness.  Frequent falls and difficulty walking. EXAM: CHEST  1 VIEW COMPARISON:  11/03/2015. FINDINGS: Bilateral lung fields are clear. Bilateral costophrenic angles are clear. Normal cardio-mediastinal silhouette. No acute osseous abnormalities. The soft tissues are within normal limits. IMPRESSION: No active disease. Electronically Signed   By: Jules Schick M.D.   On: 08/30/2023 14:06   CT Head Wo Contrast Result Date: 08/30/2023 CLINICAL DATA:  Frequent falls and difficulty walking EXAM: CT HEAD WITHOUT CONTRAST TECHNIQUE:  Contiguous axial images were obtained from the base of the skull through the vertex without intravenous contrast. RADIATION DOSE REDUCTION: This exam was performed according to the departmental dose-optimization program which includes automated exposure control, adjustment of the mA and/or kV according to patient size and/or use of iterative reconstruction technique. COMPARISON:  None Available. FINDINGS: Brain: No evidence of acute infarction, hemorrhage, mass, mass effect, or midline shift. No hydrocephalus or extra-axial fluid collection. Vascular: No hyperdense vessel. Skull: Negative for fracture or focal lesion. Sinuses/Orbits: Mucosal thickening in the left sphenoid sinus. No acute finding in the orbits. Fluid in the pneumatized portion of the  bilateral mastoid air cells, as well as in the left greater than right middle ear. IMPRESSION: 1. No acute intracranial process. 2. Fluid in the pneumatized portion of the bilateral mastoid air cells, as well as in the left greater than right middle ear. Correlate for symptoms of otomastoiditis or otitis media. Electronically Signed   By: Wiliam Ke M.D.   On: 08/30/2023 12:34    Scheduled Meds:  Chlorhexidine Gluconate Cloth  6 each Topical Daily   enoxaparin (LOVENOX) injection  50 mg Subcutaneous Q24H   insulin aspart  0-9 Units Subcutaneous TID WC   lisinopril  10 mg Oral Daily   metoprolol tartrate  25 mg Oral BID   polyethylene glycol  17 g Oral Daily   risperiDONE  0.5 mg Oral QHS   tamsulosin  0.4 mg Oral Daily   thiamine  250 mg Oral Daily   Continuous Infusions:  cefTRIAXone (ROCEPHIN)  IV 1 g (08/30/23 2119)    LOS: 1 day   Time spent: 58 mins  Jahlia Omura Laural Benes, MD How to contact the [facility] Attending or Consulting provider 7A - 7P or covering provider during after hours 7P -7A, for this patient?  Check the care team in [facility] and look for a) attending/consulting TRH provider listed and b) the [facility] team listed Log into [email or web address] to find  provider on call.  Locate the [facility] provider you are looking for under [facility] and page to a number that you can be directly reached. If you still have difficulty reaching the provider, please page the [facility] (Director on Call) for the Hospitalists listed on amion for assistance.  08/31/2023, 5:07 PM

## 2023-08-31 NOTE — Progress Notes (Signed)
Patient has become more confused/agitated as the night has progressed. Continues removing cardiac monitor wires and requesting to get up.

## 2023-08-31 NOTE — Evaluation (Signed)
Clinical/Bedside Swallow Evaluation Patient Details  Name: Jeff Miles MRN: [id number] Date of Birth: 1951-03-11  Today's Date: 08/31/2023 Time: SLP Start Time (ACUTE ONLY): 1514 SLP Stop Time (ACUTE ONLY): 1527 SLP Time Calculation (min) (ACUTE ONLY): 13 min  Past Medical History:  Past Medical History:  Diagnosis Date   CHF (congestive heart failure) (HCC)    COPD (chronic obstructive pulmonary disease) (HCC)    Dementia (HCC)    alcohol related   Diabetes (HCC)    History of Miles stones    Hypertension    Unsteady gait    Wernicke's encephalopathy    Past Surgical History:  Past Surgical History:  Procedure Laterality Date   BACK SURGERY     two   BIOPSY  06/28/2023   Procedure: BIOPSY;  Surgeon: Franky Macho, MD;  [facility];  Service: Endoscopy;;   COLONOSCOPY WITH PROPOFOL N/A 11/28/2018   Procedure: COLONOSCOPY WITH PROPOFOL;  Surgeon: West Bali, MD;  [facility];  Service: Endoscopy;  Laterality: N/A;  7:30am   COLONOSCOPY WITH PROPOFOL N/A 06/28/2023   Procedure: COLONOSCOPY WITH PROPOFOL;  Surgeon: Franky Macho, MD;  [facility];  Service: Endoscopy;  Laterality: N/A;   ESOPHAGOGASTRODUODENOSCOPY (EGD) WITH PROPOFOL N/A 06/28/2023   Procedure: ESOPHAGOGASTRODUODENOSCOPY (EGD) WITH PROPOFOL;  Surgeon: Franky Macho, MD;  [facility];  Service: Endoscopy;  Laterality: N/A;   INNER EAR SURGERY     six surgery   MASS EXCISION N/A 10/08/2019   Procedure: EXCISION MASS ON TONGUE;  Surgeon: Newman Pies, MD;  Location: [facility];  Service: ENT;  Laterality: N/A;   NOSE SURGERY     x2   POLYPECTOMY  11/28/2018   Procedure: POLYPECTOMY;  Surgeon: West Bali, MD;  [facility];  Service: Endoscopy;;  cecal polyp, ascending polyps x4, polyp at hepatic flexure, descending colon polyps x4, rectal polyps x4   HPI:  Jeff Miles is a 72 y.o. male with a PMH  significant for dementia, COPD, HTN, CHF, anemia, type II DM, anxiety, GERD.  At baseline, they live at home with his wife and are ambulatory but moderate dementia.     They presented from home to the ED on 08/30/2023 with AMS. Difficult to pinpoint onset due to reported fluctuation of baseline mental status from family members due to baseline dementia and Korsakoff but overall report about 3 days of worsening confusion. He also had a fall today which resulted in a head injury. He had been ambulating abnormally for the three days with right leg dragging behind left and moving at slower pace.   They describe urinary hesitancy and are unsure of his UOP volume since he goes to the bathroom independently. He has been eating normally and not complaining of acute pain, nausea, vomiting.     Patient is unable to provide his own history.   He has chronic tongue fasciculations but this has been worse since today.    Assessment / Plan / Recommendation  Clinical Impression  Clinical swallowing evaluation completed while Pt was sitting upright in bed. Pt consumed thin liquids and puree textures without overt s/sx of oropharyngeal dysphagia. Pt was unable to masticate regular/cracker d/t missing dentition. Recommend continue with D3/mech soft and thin liquids. Recommend meds whole with liquids. No further ST needs indicated at this time, our service will sign off. Thank you, SLP Visit Diagnosis: Dysphagia, unspecified (R13.10)       Diet Recommendation  Dysphagia 3 (Mech soft);Thin liquid    Liquid Administration via: Cup;Straw Medication Administration: Whole meds with liquid Supervision: Patient able to self feed Compensations: Minimize environmental distractions;Slow rate;Small sips/bites Postural Changes: Seated upright at 90 degrees    Other  Recommendations Oral Care Recommendations: Oral care BID    Recommendations for follow up therapy are one component of a multi-disciplinary discharge planning process,  led by the attending physician.  Recommendations may be updated based on patient status, additional functional criteria and insurance authorization.  Follow up Recommendations No SLP follow up         Functional Status Assessment Patient has had a recent decline in their functional status and demonstrates the ability to make significant improvements in function in a reasonable and predictable amount of time.  Frequency and Duration    1 week        Swallow Study   General Date of Onset: 08/31/23 HPI: Jeff Miles is a 72 y.o. male with a PMH significant for dementia, COPD, HTN, CHF, anemia, type II DM, anxiety, GERD.  At baseline, they live at home with his wife and are ambulatory but moderate dementia.     They presented from home to the ED on 08/30/2023 with AMS. Difficult to pinpoint onset due to reported fluctuation of baseline mental status from family members due to baseline dementia and Korsakoff but overall report about 3 days of worsening confusion. He also had a fall today which resulted in a head injury. He had been ambulating abnormally for the three days with right leg dragging behind left and moving at slower pace.   They describe urinary hesitancy and are unsure of his UOP volume since he goes to the bathroom independently. He has been eating normally and not complaining of acute pain, nausea, vomiting.     Patient is unable to provide his own history.   He has chronic tongue fasciculations but this has been worse since today. Type of Study: Bedside Swallow Evaluation Previous Swallow Assessment: MBSS 2017 rec for D3/thin Diet Prior to this Study: Dysphagia 3 (mechanical soft);Thin liquids (Level 0) Temperature Spikes Noted: No Respiratory Status: Room air History of Recent Intubation: No Behavior/Cognition: Alert;Cooperative;Pleasant mood Oral Cavity Assessment: Within Functional Limits Oral Cavity - Dentition: Poor condition;Missing dentition Vision: Functional for  self-feeding Self-Feeding Abilities: Able to feed self;Needs assist;Total assist;Needs set up Patient Positioning: Upright in bed Baseline Vocal Quality: Normal Volitional Cough: Cognitively unable to elicit Volitional Swallow: Able to elicit    Oral/Motor/Sensory Function Overall Oral Motor/Sensory Function: Within functional limits   Ice Chips Ice chips: Within functional limits   Thin Liquid Thin Liquid: Within functional limits    Nectar Thick Nectar Thick Liquid: Not tested   Honey Thick Honey Thick Liquid: Not tested   Puree Puree: Within functional limits   Solid     Solid: Not tested     Anneta Rounds H. Romie Levee, CCC-SLP Speech Language Pathologist  Georgetta Haber 08/31/2023,3:27 PM

## 2023-08-31 NOTE — Evaluation (Signed)
Occupational Therapy Evaluation Patient Details Name: Jeff Miles MRN: [id number] DOB: [birth year] Today's Date: 08/31/2023   History of Present Illness Jeff Miles is a 72 y.o. male with a PMH significant for dementia, COPD, HTN, CHF, anemia, type II DM, anxiety, GERD.  At baseline, they live at home with his wife and are ambulatory but moderate dementia.     They presented from home to the ED on 08/30/2023 with AMS. Difficult to pinpoint onset due to reported fluctuation of baseline mental status from family members due to baseline dementia and [place] but overall report about 3 days of worsening confusion. He also had a fall today which resulted in a head injury. He had been ambulating abnormally for the three days with right leg dragging behind left and moving at slower pace.   They describe urinary hesitancy and are unsure of his UOP volume since he goes to the bathroom independently. He has been eating normally and not complaining of acute pain, nausea, vomiting.     Patient is unable to provide his own history.   He has chronic tongue fasciculations but this has been worse since today. (Per MD)   Clinical Impression   Pt agreeable to OT and PT co-evaluation. Pt reports mostly independence at baseline with spouse available 24/7 for support. Spouse not present to confirm history. Pt required supervision assist for bed mobility and CGA to min A for ambulation and transfers. More CGA when using RW. Pt stated initially that he did not have A RW but later reported he did. Pt demonstrates good B UE strength and did not need physical assist to don sock seated in the chair. Pt left in the chair with chair alarm set and call bell within reach. Pt will benefit from continued OT in the hospital and recommended venue below to increase strength, balance, and endurance for safe ADL's.         If plan is discharge home, recommend the following: A little help with walking and/or transfers;A  little help with bathing/dressing/bathroom;Assistance with cooking/housework;Assist for transportation;Help with stairs or ramp for entrance    Functional Status Assessment  Patient has had a recent decline in their functional status and demonstrates the ability to make significant improvements in function in a reasonable and predictable amount of time.  Equipment Recommendations  None recommended by OT           Precautions / Restrictions Precautions Precautions: Fall Restrictions Weight Bearing Restrictions Per Provider Order: No      Mobility Bed Mobility Overal bed mobility: Needs Assistance Bed Mobility: Supine to Sit     Supine to sit: Supervision     General bed mobility comments: HOB slightly elevated.    Transfers Overall transfer level: Needs assistance Equipment used: Rolling walker (2 wheels) Transfers: Sit to/from Stand, Bed to chair/wheelchair/BSC Sit to Stand: Contact guard assist     Step pivot transfers: Contact guard assist, Min assist     General transfer comment: Min A for step pivot without RW; more CGA with RW      Balance Overall balance assessment: Needs assistance Sitting-balance support: No upper extremity supported, Feet supported Sitting balance-Leahy Scale: Fair Sitting balance - Comments: fair to good seated at EOB   Standing balance support: Bilateral upper extremity supported, During functional activity, Reliant on assistive device for balance Standing balance-Leahy Scale: Fair Standing balance comment: using RW; poor without RW  ADL either performed or assessed with clinical judgement   ADL Overall ADL's : Needs assistance/impaired     Grooming: Set up;Sitting   Upper Body Bathing: Set up;Sitting   Lower Body Bathing: Set up;Sitting/lateral leans;Contact guard assist   Upper Body Dressing : Set up;Sitting   Lower Body Dressing: Set up;Sitting/lateral leans   Toilet Transfer:  Contact guard assist;Minimal assistance;Stand-pivot Toilet Transfer Details (indicate cue type and reason): Single hand held assist for simulated toilet transfer to chair from bed. Toileting- Clothing Manipulation and Hygiene: Contact guard assist;Minimal assistance;Sitting/lateral lean       Functional mobility during ADLs: Contact guard assist;Rolling walker (2 wheels) General ADL Comments: Able to ambulate in the room and hall with RW and CGA.     Vision Baseline Vision/History: 1 Wears glasses Ability to See in Adequate Light: 1 Impaired Patient Visual Report: No change from baseline Vision Assessment?: No apparent visual deficits     Perception Perception: Not tested       Praxis Praxis: Not tested       Pertinent Vitals/Pain Pain Assessment Pain Assessment: No/denies pain     Extremity/Trunk Assessment Upper Extremity Assessment Upper Extremity Assessment: Overall WFL for tasks assessed   Lower Extremity Assessment Lower Extremity Assessment: Defer to PT evaluation   Cervical / Trunk Assessment Cervical / Trunk Assessment: Kyphotic   Communication Communication Communication: Hearing impairment Cueing Techniques: Gestural cues;Verbal cues   Cognition Arousal: Alert Behavior During Therapy: WFL for tasks assessed/performed Overall Cognitive Status: History of cognitive impairments - at baseline                                                        Home Living Family/patient expects to be discharged to:: Private residence Living Arrangements: Spouse/significant other Available Help at Discharge: Family;Available 24 hours/day (spouse available 24/7 per pt report.) Type of Home: House Home Access: Stairs to enter [employer] of Steps: 1 Entrance Stairs-Rails: None Home Layout: One level     Bathroom Shower/Tub: Chief Strategy Officer: Standard Bathroom Accessibility: Yes How Accessible: Accessible via  wheelchair;Accessible via walker Home Equipment:  (Might have a RW at home.)          Prior Functioning/Environment Prior Level of Function : Needs assist             Mobility Comments: Pt reports independent community mobility without AD. ADLs Comments: Pt reports independent ADL's        OT Problem List: Decreased activity tolerance;Impaired balance (sitting and/or standing)      OT Treatment/Interventions: Self-care/ADL training;Therapeutic exercise;Therapeutic activities;Patient/family education;Balance training    OT Goals(Current goals can be found in the care plan section) Acute Rehab OT Goals Patient Stated Goal: return home OT Goal Formulation: With patient Time For Goal Achievement: 09/14/23 Potential to Achieve Goals: Good  OT Frequency: Min 1X/week    Co-evaluation PT/OT/SLP Co-Evaluation/Treatment: Yes Reason for Co-Treatment: To address functional/ADL transfers   OT goals addressed during session: ADL's and self-care                       End of Session Equipment Utilized During Treatment: Rolling walker (2 wheels);Gait belt Nurse Communication: Other (comment) (notified pt was in the chair)  Activity Tolerance: Patient tolerated treatment well Patient left: in chair;with call bell/phone within reach;with  chair alarm set  OT Visit Diagnosis: Unsteadiness on feet (R26.81);Other abnormalities of gait and mobility (R26.89);Muscle weakness (generalized) (M62.81);History of falling (Z91.81)                Time: 1478-2956 OT Time Calculation (min): 25 min Charges:  OT General Charges $OT Visit: 1 Visit OT Evaluation $OT Eval Low Complexity: 1 Low  India Jolin OT, MOT  Danie Chandler 08/31/2023, 11:16 AM

## 2023-08-31 NOTE — Plan of Care (Signed)
  Problem: Education: Goal: Knowledge of General Education information will improve Description: Including pain rating scale, medication(s)/side effects and non-pharmacologic comfort measures Outcome: Progressing   Problem: Health Behavior/Discharge Planning: Goal: Ability to manage health-related needs will improve Outcome: Progressing   Problem: Clinical Measurements: Goal: Ability to maintain clinical measurements within normal limits will improve Outcome: Progressing Goal: Will remain free from infection Outcome: Progressing Goal: Diagnostic test results will improve Outcome: Progressing Goal: Respiratory complications will improve Outcome: Progressing Goal: Cardiovascular complication will be avoided Outcome: Progressing   Problem: Activity: Goal: Risk for activity intolerance will decrease Outcome: Progressing   Problem: Nutrition: Goal: Adequate nutrition will be maintained Outcome: Progressing   Problem: Coping: Goal: Level of anxiety will decrease Outcome: Progressing   Problem: Elimination: Goal: Will not experience complications related to bowel motility Outcome: Progressing Goal: Will not experience complications related to urinary retention Outcome: Progressing   Problem: Pain Management: Goal: General experience of comfort will improve Outcome: Progressing   Problem: Safety: Goal: Ability to remain free from injury will improve Outcome: Progressing   Problem: Skin Integrity: Goal: Risk for impaired skin integrity will decrease Outcome: Progressing   Problem: Education: Goal: Ability to describe self-care measures that may prevent or decrease complications (Diabetes Survival Skills Education) will improve Outcome: Progressing Goal: Individualized Educational Video(s) Outcome: Progressing   Problem: Coping: Goal: Ability to adjust to condition or change in health will improve Outcome: Progressing   Problem: Fluid Volume: Goal: Ability to  maintain a balanced intake and output will improve Outcome: Progressing   Problem: Health Behavior/Discharge Planning: Goal: Ability to identify and utilize available resources and services will improve Outcome: Progressing Goal: Ability to manage health-related needs will improve Outcome: Progressing   Problem: Metabolic: Goal: Ability to maintain appropriate glucose levels will improve Outcome: Progressing   Problem: Nutritional: Goal: Maintenance of adequate nutrition will improve Outcome: Progressing Goal: Progress toward achieving an optimal weight will improve Outcome: Progressing   Problem: Skin Integrity: Goal: Risk for impaired skin integrity will decrease Outcome: Progressing   Problem: Tissue Perfusion: Goal: Adequacy of tissue perfusion will improve Outcome: Progressing   Problem: Safety: Goal: Non-violent Restraint(s) Outcome: Progressing

## 2023-08-31 NOTE — Plan of Care (Signed)
  Problem: Education: Goal: Knowledge of General Education information will improve Description: Including pain rating scale, medication(s)/side effects and non-pharmacologic comfort measures Outcome: Not Progressing   Problem: Clinical Measurements: Goal: Ability to maintain clinical measurements within normal limits will improve Outcome: Progressing   Problem: Safety: Goal: Ability to remain free from injury will improve Outcome: Progressing

## 2023-08-31 NOTE — Progress Notes (Signed)
MD verbal order for foley to remain in place today. Plan for removal tomorrow 09/01/23.  Verbal order also provided for pt to travel without telemetry to MRI this morning.

## 2023-08-31 NOTE — Plan of Care (Signed)
  Problem: Acute Rehab OT Goals (only OT should resolve) Goal: Pt. Will Perform Grooming Flowsheets (Taken 08/31/2023 1118) Pt Will Perform Grooming:  with modified independence  standing Goal: Pt. Will Perform Lower Body Dressing Flowsheets (Taken 08/31/2023 1118) Pt Will Perform Lower Body Dressing: with modified independence Goal: Pt. Will Transfer To Toilet Flowsheets (Taken 08/31/2023 1118) Pt Will Transfer to Toilet:  with modified independence  ambulating Goal: Pt. Will Perform Toileting-Clothing Manipulation Flowsheets (Taken 08/31/2023 1118) Pt Will Perform Toileting - Clothing Manipulation and hygiene: with modified independence  Traven Davids OT, MOT

## 2023-08-31 NOTE — Plan of Care (Signed)
  Problem: Acute Rehab PT Goals(only PT should resolve) Goal: Pt Will Go Supine/Side To Sit Outcome: Progressing Flowsheets (Taken 08/31/2023 1234) Pt will go Supine/Side to Sit:  with modified independence  with supervision Goal: Patient Will Transfer Sit To/From Stand Outcome: Progressing Flowsheets (Taken 08/31/2023 1234) Patient will transfer sit to/from stand:  with modified independence  with supervision Goal: Pt Will Transfer Bed To Chair/Chair To Bed Outcome: Progressing Flowsheets (Taken 08/31/2023 1234) Pt will Transfer Bed to Chair/Chair to Bed:  with modified independence  with supervision Goal: Pt Will Ambulate Outcome: Progressing Flowsheets (Taken 08/31/2023 1234) Pt will Ambulate:  > 125 feet  with modified independence  with supervision  with rolling walker   12:35 PM, 08/31/23 Ocie Bob, MPT Physical Therapist with [facility] [phone] office [phone] mobile phone

## 2023-09-01 ENCOUNTER — Inpatient Hospital Stay (HOSPITAL_COMMUNITY): Payer: Medicare HMO

## 2023-09-01 ENCOUNTER — Other Ambulatory Visit (HOSPITAL_COMMUNITY): Payer: Medicare HMO

## 2023-09-01 ENCOUNTER — Other Ambulatory Visit (HOSPITAL_COMMUNITY): Payer: Self-pay | Admitting: *Deleted

## 2023-09-01 DIAGNOSIS — E876 Hypokalemia: Secondary | ICD-10-CM | POA: Diagnosis not present

## 2023-09-01 DIAGNOSIS — G9341 Metabolic encephalopathy: Secondary | ICD-10-CM | POA: Diagnosis not present

## 2023-09-01 DIAGNOSIS — I509 Heart failure, unspecified: Secondary | ICD-10-CM

## 2023-09-01 DIAGNOSIS — I160 Hypertensive urgency: Secondary | ICD-10-CM | POA: Diagnosis not present

## 2023-09-01 DIAGNOSIS — R000 Tachycardia, unspecified: Secondary | ICD-10-CM

## 2023-09-01 DIAGNOSIS — R7989 Other specified abnormal findings of blood chemistry: Secondary | ICD-10-CM | POA: Diagnosis not present

## 2023-09-01 LAB — BASIC METABOLIC PANEL
Anion gap: 13 (ref 5–15)
BUN: 8 mg/dL (ref 8–23)
CO2: 22 mmol/L (ref 22–32)
Calcium: 9.3 mg/dL (ref 8.9–10.3)
Chloride: 105 mmol/L (ref 98–111)
Creatinine, Ser: 0.91 mg/dL (ref 0.61–1.24)
GFR, Estimated: >60 mL/min (ref >60)
Glucose, Bld: 160 mg/dL — ABNORMAL HIGH (ref 70–99)
Potassium: 3.9 mmol/L (ref 3.5–5.1)
Sodium: 140 mmol/L (ref 135–145)

## 2023-09-01 LAB — ECHOCARDIOGRAM COMPLETE
AR max vel: 1.8 cm2
AV Area VTI: 1.74 cm2
AV Area mean vel: 1.72 cm2
AV Mean grad: 14 mm[Hg]
AV Peak grad: 23.8 mm[Hg]
Ao pk vel: 2.44 m/s
Area-P 1/2: 2.95 cm2
Height: 69 in
S' Lateral: 3.4 cm
Weight: 3488.56 [oz_av]

## 2023-09-01 LAB — GLUCOSE, CAPILLARY
Glucose-Capillary: 159 mg/dL — ABNORMAL HIGH (ref 70–99)
Glucose-Capillary: 142 mg/dL — ABNORMAL HIGH (ref 70–99)
Glucose-Capillary: 142 mg/dL — ABNORMAL HIGH (ref 70–99)
Glucose-Capillary: 111 mg/dL — ABNORMAL HIGH (ref 70–99)
Glucose-Capillary: 155 mg/dL — ABNORMAL HIGH (ref 70–99)

## 2023-09-01 LAB — CBC
HCT: 34.2 % — ABNORMAL LOW (ref 39.0–52.0)
Hemoglobin: 10.7 g/dL — ABNORMAL LOW (ref 13.0–17.0)
MCH: 28 pg (ref 26.0–34.0)
MCHC: 31.3 g/dL (ref 30.0–36.0)
MCV: 89.5 fL (ref 80.0–100.0)
Platelets: 264 10*3/uL (ref 150–400)
RBC: 3.82 MIL/uL — ABNORMAL LOW (ref 4.22–5.81)
RDW: 14.2 % (ref 11.5–15.5)
WBC: 10.1 10*3/uL (ref 4.0–10.5)
nRBC: 0 % (ref 0.0–0.2)

## 2023-09-01 LAB — FOLATE: Folate: 29.3 ng/mL (ref >5.9)

## 2023-09-01 LAB — MAGNESIUM: Magnesium: 1.6 mg/dL — ABNORMAL LOW (ref 1.7–2.4)

## 2023-09-01 LAB — TSH: TSH: 1.673 u[IU]/mL (ref 0.350–4.500)

## 2023-09-01 MED ORDER — POLYVINYL ALCOHOL 1.4 % OP SOLN: 1.0000 [drp] | OPHTHALMIC | Status: DC | PRN

## 2023-09-01 MED ORDER — POLYETHYLENE GLYCOL 3350 17 G PO PACK: 17.0000 g | PACK | Freq: Every day | ORAL | Status: DC

## 2023-09-01 MED ORDER — SENNA 8.6 MG PO TABS
2.0000 | ORAL_TABLET | Freq: Every day | ORAL | Status: DC
Start: 2023-09-01 — End: 2023-09-03
  Administered 2023-09-01 – 2023-09-03 (×3): 17.2 mg via ORAL
  Filled 2023-09-01 (×3): qty 2

## 2023-09-01 MED ORDER — MAGNESIUM SULFATE 2 GM/50ML IV SOLN
2.0000 g | Freq: Once | INTRAVENOUS | Status: AC
  Administered 2023-09-01: 2 g via INTRAVENOUS
  Filled 2023-09-01: qty 50

## 2023-09-01 MED ORDER — SALINE SPRAY 0.65 % NA SOLN
1.0000 | NASAL | Status: DC | PRN
Start: 2023-09-01 — End: 2023-09-03

## 2023-09-01 NOTE — Consult Note (Addendum)
Cardiology Consultation   Patient ID: Jeff Miles MRN: [id number]; DOB: [birth year]  Admit date: 08/30/2023 Date of Consult: 09/01/2023  PCP:  Benita Stabile, MD   [facility] Providers Cardiologist:  None      New consult completed by Dr Dominga Ferry  Patient Profile:   Jeff Miles is a 72 y.o. male with a hx of dementia, Warnicke's encephalopathy, COPD, hypertension, CHF, anemia, recent GI bleed, type 2 diabetes, anxiety, GERD who is being seen 09/01/2023 for the evaluation of new onset atrial fibrillation/atrial flutter at the request of Dr. Dareen Piano.  History of Present Illness:   Mr. Maddaloni was recently hospitalized from 10/13 - 06/29/2023 for rectal bleeding.  He had started a couple of days ago was having blood in his brief.  He denies any pain.  On the day he presented to the hospital and got really bad.  He had had 4 episodes that filled the toilet with blood.  The wife was at the bedside reported he never had bleeding like this before.  His last colonoscopy was documented in 2020.  At that time he had had 15 polyps removed and was noted to have diverticulosis he was also noted to have internal hemorrhoids.  He was FOBT positive in the emergency department with no other complaints at the time.  GI was consulted and EGD revealed multiple gastric polyps and gastritis with a normal esophagus and duodenum.  Colostomy showed stool in the ascending colon and cecum diverticulosis of the left colon with nonbleeding external and internal hemorrhoids with evidence of residual polyp tissue from prior post polypectomy scar.  It was thought that the patient had hemorrhoidal bleeding and he was treated with Anusol HC.  He was discharged with a hemoglobin stable above 11.  He presented to [facility] emergency department on 08/30/2023 for the evaluation of increased generalized weakness along with fall that occurred early in the morning.  Family at the bedside stated that he has had  frequent falls with difficulty walking at baseline the last several days he was found to be shuffling with his left leg more pronounced which may have led to his fall.  His wife found him lying in the hallway of their home and she states he may have been laying there possibly up to an hour but not more.  She was unable to get him up out of the floor and called family members who came and assisted in retrieving them off the floor.  He had elevated heart rate of 140s which.  Atrial paced/atrial flutter on bedside ECG.  He received 2 doses of IV Lopressor and his repeat ECG showed sinus rhythm at 98.  Wife stated he had a follow-up appointment with Dr. Delena Serve next month anticipate an additional colonoscopy.  Stated that he had been receiving iron transfusions as well.  Initial vital signs: Blood pressure 147/60, pulse 100, temperature 97.6  Pertinent labs: WBCs of 10.8, hemoglobin 10.2, magnesium 1.3, potassium 3.2, blood glucose 110, AST 56, high-sensitivity troponin of 108 and 109, BNP of 57  Imaging: CT of the head showed no acute intracranial process; chest x-ray revealed no active disease.  MRI of the brain with without contrast revealed no acute intracranial process with no evidence of acute or subacute infarct  Medications administered in the emergency department: Lorazepam 2 mg IVP, magnesium sulfate 2 g IVPB, potassium chloride 10 mEq IVPB, and Haldol 2.5 mg IVP  Cardiology was consulted for concerns of new onset atrial fibrillation  atrial flutter  Past Medical History:  Diagnosis Date   CHF (congestive heart failure) (HCC)    COPD (chronic obstructive pulmonary disease) (HCC)    Dementia (HCC)    alcohol related   Diabetes (HCC)    History of kidney stones    Hypertension    Unsteady gait    Wernicke's encephalopathy     Past Surgical History:  Procedure Laterality Date   BACK SURGERY     two   BIOPSY  06/28/2023   Procedure: BIOPSY;  Surgeon: Franky Macho, MD;  [facility];  Service: Endoscopy;;   COLONOSCOPY WITH PROPOFOL N/A 11/28/2018   Procedure: COLONOSCOPY WITH PROPOFOL;  Surgeon: West Bali, MD;  [facility]: [facility];  Service: Endoscopy;  Laterality: N/A;  7:30am   COLONOSCOPY WITH PROPOFOL N/A 06/28/2023   Procedure: COLONOSCOPY WITH PROPOFOL;  Surgeon: Franky Macho, MD;  [facility]: [facility];  Service: Endoscopy;  Laterality: N/A;   ESOPHAGOGASTRODUODENOSCOPY (EGD) WITH PROPOFOL N/A 06/28/2023   Procedure: ESOPHAGOGASTRODUODENOSCOPY (EGD) WITH PROPOFOL;  Surgeon: Franky Macho, MD;  [facility];  Service: Endoscopy;  Laterality: N/A;   INNER EAR SURGERY     six surgery   MASS EXCISION N/A 10/08/2019   Procedure: EXCISION MASS ON TONGUE;  Surgeon: Newman Pies, MD;  Location: [facility];  Service: ENT;  Laterality: N/A;   NOSE SURGERY     x2   POLYPECTOMY  11/28/2018   Procedure: POLYPECTOMY;  Surgeon: West Bali, MD;  Location: [facility];  Service: Endoscopy;;  cecal polyp, ascending polyps x4, polyp at hepatic flexure, descending colon polyps x4, rectal polyps x4     Home Medications:  Prior to Admission medications   Medication Sig Start Date End Date Taking? Authorizing Provider  albuterol (PROVENTIL) (2.5 MG/3ML) 0.083% nebulizer solution Take 2.5 mg by nebulization 3 (three) times daily.   Yes [provider]  Albuterol Sulfate (PROAIR HFA IN) Inhale 2 puffs into the lungs daily.   Yes [provider]  Artificial Tear Solution (SOOTHE XP OP) Apply 1 drop to eye daily as needed (dry eyes).   Yes [provider]  atorvastatin (LIPITOR) 80 MG tablet Take 80 mg by mouth daily.   Yes [provider]  Fluticasone-Umeclidin-Vilant (TRELEGY ELLIPTA) 100-62.5-25 MCG/INH AEPB Inhale 1 puff into the lungs daily.   Yes Alver Fisher, RN  furosemide (LASIX) 40 MG tablet Take 1 tablet (40 mg total) by mouth daily. 11/11/15  Yes Vassie Loll, MD  glipiZIDE  (GLUCOTROL XL) 5 MG 24 hr tablet Take 5 mg by mouth 2 (two) times daily.   Yes [provider]  Iron-FA-B Cmp-C-Biot-Probiotic (FUSION PLUS) CAPS Take 1 capsule by mouth daily. 04/26/23  Yes [provider]  lisinopril (ZESTRIL) 10 MG tablet Take 10 mg by mouth daily.   Yes [provider]  LORazepam (ATIVAN) 1 MG tablet Take 1 mg by mouth daily.    Yes [provider]  metFORMIN (GLUCOPHAGE) 1000 MG tablet Take 1,000 mg by mouth 2 (two) times daily. 06/20/23  Yes [provider]  mirtazapine (REMERON) 15 MG tablet Take 15 mg by mouth at bedtime.   Yes [provider]  Multiple Vitamin (MULTIVITAMIN WITH MINERALS) TABS tablet Take 1 tablet by mouth daily. 11/11/15  Yes Vassie Loll, MD  naltrexone (DEPADE) 50 MG tablet Take 50 mg by mouth daily.   Yes [provider]  Omega-3 Fatty Acids (FISH OIL) 1000  MG CAPS Take 1,000 mg by mouth daily.    Yes [provider]  omeprazole (PRILOSEC) 20 MG capsule Take 20 mg by mouth daily. 08/27/23  Yes [provider]  pioglitazone (ACTOS) 30 MG tablet Take 30 mg by mouth daily.   Yes [provider]  risperiDONE (RISPERDAL) 0.5 MG tablet Take 0.5 mg by mouth 2 (two) times daily.   Yes [provider]  Thiamine HCl (VITAMIN B-1) 250 MG tablet Take 250 mg by mouth daily.   Yes [provider]  vitamin B-12 1000 MCG tablet Take 1 tablet (1,000 mcg total) by mouth daily. 11/11/15  Yes Vassie Loll, MD    Inpatient Medications: Scheduled Meds:  Chlorhexidine Gluconate Cloth  6 each Topical Daily   enoxaparin (LOVENOX) injection  50 mg Subcutaneous Q24H   insulin aspart  0-9 Units Subcutaneous TID WC   lisinopril  10 mg Oral Daily   metoprolol tartrate  25 mg Oral BID   polyethylene glycol  17 g Oral Daily   risperiDONE  0.5 mg Oral QHS   tamsulosin  0.4 mg Oral Daily   thiamine  250 mg Oral Daily   Continuous Infusions:  cefTRIAXone (ROCEPHIN)  IV  1 g (08/31/23 1922)   PRN Meds: acetaminophen **OR** acetaminophen, bisacodyl, ipratropium-albuterol, LORazepam, metoprolol tartrate  Allergies:   No Known Allergies  Social History:   Social History   Socioeconomic History   Marital status: Married    Spouse name: Not on file   Number of children: Not on file   Years of education: Not on file   Highest education level: Not on file  Occupational History   Not on file  Tobacco Use   Smoking status: Former    Current packs/day: 0.00    Average packs/day: 2.0 packs/day for 34.0 years (68.0 ttl pk-yrs)    Types: Cigarettes    Start date: 10/23/1981    Quit date: 10/24/2015    Years since quitting: 7.8   Smokeless tobacco: Never  Vaping Use   Vaping status: Never Used  Substance and Sexual Activity   Alcohol use: Not Currently    Comment: None since 2017   Drug use: No   Sexual activity: Not Currently  Other Topics Concern   Not on file  Social History Narrative   Not on file   Social Drivers of Health   Financial Resource Strain: Not on file  Food Insecurity: No Food Insecurity (08/30/2023)   Hunger Vital Sign    Worried About Running Out of Food in the Last Year: Never true    Ran Out of Food in the Last Year: Never true  Transportation Needs: No Transportation Needs (08/30/2023)   PRAPARE - Administrator, Civil Service (Medical): No    Lack of Transportation (Non-Medical): No  Physical Activity: Not on file  Stress: Not on file  Social Connections: Not on file  Intimate Partner Violence: Patient Unable To Answer (08/30/2023)   Humiliation, Afraid, Rape, and Kick questionnaire    Fear of Current or Ex-Partner: Patient unable to answer    Emotionally Abused: Patient unable to answer    Physically Abused: Patient unable to answer    Sexually Abused: Patient unable to answer    Family History:    Family History  Problem Relation Age of Onset   Colon cancer Neg Hx    Liver disease Neg Hx       ROS:  Please see the history of present illness.  Review of Systems  Constitutional:  Positive for malaise/fatigue.  HENT:  Positive for hearing loss.   Gastrointestinal:  Positive for blood in stool.  Musculoskeletal:  Positive for falls.  Neurological:  Positive for weakness.  Psychiatric/Behavioral:  Positive for memory loss.     All other ROS reviewed and negative.     Physical Exam/Data:   Vitals:   09/01/23 0500 09/01/23 0600 09/01/23 0748 09/01/23 0800  BP: (!) 171/102 (!) 183/109  (!) 145/86  Pulse: (!) 135 (!) 136  (!) 110  Resp: (!) 37 (!) 33  (!) 26  Temp:   99 F (37.2 C)   TempSrc:   Axillary   SpO2: 97% 95%  96%  Weight:      Height:        Intake/Output Summary (Last 24 hours) at 09/01/2023 0914 Last data filed at 09/01/2023 0356 Gross per 24 hour  Intake 540 ml  Output 975 ml  Net -435 ml      08/30/2023    6:00 PM 08/30/2023   10:06 AM 08/09/2023   10:48 AM  Last 3 Weights  Weight (lbs) 218 lb 0.6 oz 228 lb 13.4 oz 229 lb  Weight (kg) 98.9 kg 103.8 kg 103.874 kg     Body mass index is 32.2 kg/m.  General:  Ill-appearing in no acute distress HEENT: normal Neck: no JVD Vascular: No carotid bruits; Distal pulses 2+ bilaterally Cardiac:  normal S1, S2; RRR; tachycardiac, no murmur  Lungs:  coarse to auscultation bilaterally,congested cough, respirations are unlabored at rest on 3L O2 via [place] Abd: soft, nontender, obese, no hepatomegaly  Ext: no edema, dry flaking skin with woody appearance  Musculoskeletal:  No deformities, BUE and BLE strength normal and equal Skin: warm and dry  Neuro:  CNs 2-12 intact, no focal abnormalities noted Psych:  Normal affect   EKG:  The EKG was personally reviewed and demonstrates: Sinus tachycardia with rate of 58 with right bundle Quinnten Calvin block Telemetry:  Telemetry was personally reviewed and demonstrates:  sinus to sinus tachycardia with rates 90-110  Relevant CV Studies: 2D echo 10/25/2015 Study  Conclusions   - Left ventricle: The cavity size was normal. Wall thickness was    normal. Systolic function was normal. The estimated ejection    fraction was in the range of 50% to 55%. Wall motion was normal;    there were no regional wall motion abnormalities. Doppler    parameters are consistent with abnormal left ventricular    relaxation (grade 1 diastolic dysfunction). Doppler parameters    are consistent with high ventricular filling pressure.    Laboratory Data:  High Sensitivity Troponin:   Recent Labs  Lab 08/30/23 1128 08/30/23 1402 08/30/23 2000  TROPONINIHS 108* 109* 154*     Chemistry Recent Labs  Lab 08/30/23 1128 08/31/23 0428 09/01/23 0450  NA 137 141 140  K 3.2* 3.1* 3.9  CL 99 103 105  CO2 28 25 22   GLUCOSE 110* 124* 160*  BUN 13 9 8   CREATININE 1.05 0.83 0.91  CALCIUM 9.3 9.1 9.3  MG 1.3*  --  1.6*  GFRNONAA >60 >60 >60  ANIONGAP 10 13 13     Recent Labs  Lab 08/30/23 1128  PROT 7.2  ALBUMIN 3.6  AST 56*  ALT 21  ALKPHOS 73  BILITOT 0.9   Lipids No results for input(s): "CHOL", "TRIG", "HDL", "LABVLDL", "LDLCALC", "CHOLHDL" in the last 168 hours.  Hematology Recent Labs  Lab 08/30/23 1128 08/31/23 0428 09/01/23  0450  WBC 10.8* 8.1 10.1  RBC 3.64* 3.24*  3.29* 3.82*  HGB 10.2* 9.2* 10.7*  HCT 32.4* 29.4* 34.2*  MCV 89.0 90.7 89.5  MCH 28.0 28.4 28.0  MCHC 31.5 31.3 31.3  RDW 14.5 14.4 14.2  PLT 267 230 264   Thyroid No results for input(s): "TSH", "FREET4" in the last 168 hours.  BNP Recent Labs  Lab 08/30/23 1128  BNP 57.0    DDimer No results for input(s): "DDIMER" in the last 168 hours.   Radiology/Studies:  MR BRAIN W WO CONTRAST Result Date: 08/31/2023 CLINICAL DATA:  Stroke suspected EXAM: MRI HEAD WITHOUT AND WITH CONTRAST TECHNIQUE: Multiplanar, multiecho pulse sequences of the brain and surrounding structures were obtained without and with intravenous contrast. CONTRAST:  10mL GADAVIST GADOBUTROL 1 MMOL/ML IV  SOLN COMPARISON:  None Available. FINDINGS: Brain: No restricted diffusion to suggest acute or subacute infarct. No abnormal parenchymal or meningeal enhancement. No acute hemorrhage, mass, mass effect, or midline shift. No hydrocephalus or extra-axial collection. Pituitary and craniocervical junction within normal limits. No hemosiderin deposition to suggest remote hemorrhage. Scattered T2 hyperintense signal in the periventricular white matter, likely the sequela of mild-to-moderate chronic small vessel ischemic disease. Mildly advanced central volume loss for age. Vascular: Normal arterial flow voids. Normal arterial and venous enhancement. Skull and upper cervical spine: Normal marrow signal. Sinuses/Orbits: Mucosal thickening in the ethmoid air cells and left sphenoid sinus. No acute finding in the orbits. Other: The mastoid air cells are well aerated. IMPRESSION: No acute intracranial process. No evidence of acute or subacute infarct. Electronically Signed   By: Wiliam Ke M.D.   On: 08/31/2023 11:34   DG HIPS BILAT WITH PELVIS MIN 5 VIEWS Result Date: 08/30/2023 CLINICAL DATA:  [id number] Fall [id number].  Difficulty walking. EXAM: DG HIP (WITH OR WITHOUT PELVIS) 5+V BILAT COMPARISON:  None Available. FINDINGS: Pelvis is intact with normal and symmetric sacroiliac joints. No acute fracture or dislocation. No aggressive osseous lesion. Probable old/healed right greater trochanter avulsion fracture noted. Visualized sacral arcuate lines are unremarkable. There are changes of chronic pubic symphisitis. There are mild degenerative changes of bilateral hip joints without significant joint space narrowing. Osteophytosis of the superior acetabulum. No radiopaque foreign bodies. IMPRESSION: *No acute osseous abnormality of the pelvis or bilateral hip joints. Electronically Signed   By: Jules Schick M.D.   On: 08/30/2023 14:08   DG Chest 1 View Result Date: 08/30/2023 CLINICAL DATA:  Weakness.  Frequent falls and  difficulty walking. EXAM: CHEST  1 VIEW COMPARISON:  11/03/2015. FINDINGS: Bilateral lung fields are clear. Bilateral costophrenic angles are clear. Normal cardio-mediastinal silhouette. No acute osseous abnormalities. The soft tissues are within normal limits. IMPRESSION: No active disease. Electronically Signed   By: Jules Schick M.D.   On: 08/30/2023 14:06   CT Head Wo Contrast Result Date: 08/30/2023 CLINICAL DATA:  Frequent falls and difficulty walking EXAM: CT HEAD WITHOUT CONTRAST TECHNIQUE: Contiguous axial images were obtained from the base of the skull through the vertex without intravenous contrast. RADIATION DOSE REDUCTION: This exam was performed according to the departmental dose-optimization program which includes automated exposure control, adjustment of the mA and/or kV according to patient size and/or use of iterative reconstruction technique. COMPARISON:  None Available. FINDINGS: Brain: No evidence of acute infarction, hemorrhage, mass, mass effect, or midline shift. No hydrocephalus or extra-axial fluid collection. Vascular: No hyperdense vessel. Skull: Negative for fracture or focal lesion. Sinuses/Orbits: Mucosal thickening in the left sphenoid sinus. No acute finding in  the orbits. Fluid in the pneumatized portion of the bilateral mastoid air cells, as well as in the left greater than right middle ear. IMPRESSION: 1. No acute intracranial process. 2. Fluid in the pneumatized portion of the bilateral mastoid air cells, as well as in the left greater than right middle ear. Correlate for symptoms of otomastoiditis or otitis media. Electronically Signed   By: Wiliam Ke M.D.   On: 08/30/2023 12:34     Assessment and Plan:   Tachycardia -Narrow complex regular tachycardia in ER -Remains sinus rhythm sinus tach on telemetry monitoring -Continue with telemetry -Continue on metoprolol tartrate 25 mg twice daily -Recommend keeping potassium closer to 4 magnesium 2 -Daily BMP -TSH  added to previously drawn blood -CHA2DS2-VASc score of at least 4 with anticoagulation remaining on hold due to previous workup for GI bleed and current anemia -Rate likely exacerbated by underlying conditions -echocardiogram ordered and pending with further recommendations to follow  Acute metabolic encephalopathy -MRI of the brain negative for acute findings -Family remains at bedside -Mentation appears to be improving -Continued on neuro checks -Likely component of underlying dementia  Electrolyte derangement -Previous hypokalemia given oral replacement -Hypomagnesemia with supplementation given -Recommend keeping potassium greater than 4 and magnesium 2 -Daily BMP -Monitor/trend/replete electrolytes as needed  4.   Microcytic anemia/recent GI bleed -Anemia panel for iron deficiency -According to wife patient has been receiving iron infusions -Hemoglobin 10.7 -Daily CBC -Continue trending hemoglobin transfuse for anything less than 8  5.    Elevated high-sensitivity troponin -Patient with no complaints of chest pain -EKG non acute -High-sensitivity troponins trended 108, 109, 154 -Continue to trend until decline -Patient remains chest pain-free -Not a candidate for anticoagulation with heparin due to recent GI bleed, nonacute EKG, and no chest discomfort -Likely supply demand ischemia, electrolyte derangement, previous mechanical fall, and hypertension -Continue with telemetry monitoring -EKG as needed for pain or changes  6.   Hypertension -Blood pressure 145/86 -Continued on lisinopril 10 mg daily, metoprolol titrate 25 mg twice daily -Vital signs per unit protocol  7.   Type 2 diabetes -Continued on insulin therapy -Continue management per IM  8.   Acute urinary retention - -435 output in the last 24 hours -Foley catheter remains in place -Continued on Flomax daily  Risk Assessment/Risk Scores:          CHA2DS2-VASc Score = 4   This indicates a 4.8%  annual risk of stroke. The patient's score is based upon: CHF History: 1 HTN History: 1 Diabetes History: 1 Stroke History: 0 Vascular Disease History: 0 Age Score: 1 Gender Score: 0         For questions or updates, please contact [facility] Please consult [email or web address] for contact info under    Signed, SHERI HAMMOCK, NP  09/01/2023 9:14 AM  Attending Note  Patient seen and discussed with NP Hammock, I agree with her documentation. 72 yo male history of dementia, COPD, HTN, DM2, admitted with AMS. During admission new diagnosis of afib with RVR, cardiology consulted to help manage   HgbA1c 5.5 WBC 10.8 Hgb 10.2 Plt 267 Mg 1.3 K 3.2 BUN 13 Cr 1.05 BNP 57 TSH 1.6 Trop 108-->109-->154 EKG sinus tach RBBB  CT head: no acute process CXR no acute process MRI brain:no acute process   1.Tachycardia - EKGs and tele reviewed, not convincing this was aflutter as opposed to just sinus tachycardia. Majority of tele shows sinus tach, at times with higher rates can be difficult to  distinguish if could be flutter but I don't see anyhting convinging - continue tele at this time, would not commit to anticoag at this time. He also is being worked up for possible GI bleed.  - presented with AMS, electrolyte abnormalities, urinary retention, possible GI bleed. Keep K at 4, Mg at 2. Can continue lopressor - f/u echo   2. Elevated troponin - mild trop elevation in setting of systemic stress and absence of cardiopulmonary symptoms - suspect demand ischemia. Ongoing AMS and possible GI bleed would affect considerations for ischemic testing.  - f/u echo - no plans for ishcemic testing at this time.   We will f/u peripherally tomorrow and review telemetry and echo   Dina Rich MD

## 2023-09-01 NOTE — Progress Notes (Signed)
Physical Therapy Treatment Patient Details Name: Jeff Miles MRN: [id number] DOB: [birth year] Today's Date: 09/01/2023   History of Present Illness Jeff Miles is a 72 y.o. male with a PMH significant for dementia, COPD, HTN, CHF, anemia, type II DM, anxiety, GERD.  At baseline, they live at home with his wife and are ambulatory but moderate dementia.     They presented from home to the ED on 08/30/2023 with AMS. Difficult to pinpoint onset due to reported fluctuation of baseline mental status from family members due to baseline dementia and [place]off but overall report about 3 days of worsening confusion. He also had a fall today which resulted in a head injury. He had been ambulating abnormally for the three days with right leg dragging behind left and moving at slower pace.   They describe urinary hesitancy and are unsure of his UOP volume since he goes to the bathroom independently. He has been eating normally and not complaining of acute pain, nausea, vomiting.     Patient is unable to provide his own history.   He has chronic tongue fasciculations but this has been worse since today.    PT Comments  Pt supine in bed with family and RN present in room.  Pt HOH, best to talk on Lt side.  Pt presents with slow labored movements with bed mobility, transfer and gait mechanics with shuffled gait mechanics.  Cueing to pick up feet and increase stride length.  EOS pt left in chair with call bell within reach and chair alarm set.     If plan is discharge home, recommend the following:     Can travel by private vehicle        Equipment Recommendations       Recommendations for Other Services       Precautions / Restrictions Precautions Precautions: Fall Restrictions Weight Bearing Restrictions Per Provider Order: No     Mobility  Bed Mobility Overal bed mobility: Needs Assistance Bed Mobility: Supine to Sit           General bed mobility comments: HOB slightly  elevated, slow labored movements    Transfers Overall transfer level: Independent Equipment used: Rolling walker (2 wheels) Transfers: Sit to/from Stand, Bed to chair/wheelchair/BSC             General transfer comment: labored unsteady movement,    Ambulation/Gait Ambulation/Gait assistance: Contact guard assist, Min assist Gait Distance (Feet): 75 Feet Assistive device: Rolling walker (2 wheels) Gait Pattern/deviations: Decreased step length - left, Decreased stance time - right, Decreased stride length, Shuffle Gait velocity: decreased     General Gait Details: slow labored movements with shuffled gait, no LOB episode.  Limited mostly due to fatigue.   Stairs             Wheelchair Mobility     Tilt Bed    Modified Rankin (Stroke Patients Only)       Balance                                            Cognition Arousal: Alert Behavior During Therapy: WFL for tasks assessed/performed Overall Cognitive Status: History of cognitive impairments - at baseline  Exercises      General Comments        Pertinent Vitals/Pain Pain Assessment Pain Assessment: No/denies pain    Home Living                          Prior Function            PT Goals (current goals can now be found in the care plan section)      Frequency           PT Plan      Co-evaluation              AM-PAC PT "6 Clicks" Mobility   Outcome Measure  Help needed turning from your back to your side while in a flat bed without using bedrails?: None Help needed moving from lying on your back to sitting on the side of a flat bed without using bedrails?: A Little Help needed moving to and from a bed to a chair (including a wheelchair)?: A Little Help needed standing up from a chair using your arms (e.g., wheelchair or bedside chair)?: A Little Help needed to walk in hospital room?:  A Little Help needed climbing 3-5 steps with a railing? : A Lot 6 Click Score: 18    End of Session Equipment Utilized During Treatment: Gait belt Activity Tolerance: Patient tolerated treatment well;Patient limited by fatigue Patient left: in chair;with call bell/phone within reach;with chair alarm set Nurse Communication: Mobility status PT Visit Diagnosis: Unsteadiness on feet (R26.81);Other abnormalities of gait and mobility (R26.89);Muscle weakness (generalized) (M62.81)     Time: 4166-0630 PT Time Calculation (min) (ACUTE ONLY): 25 min  Charges:    $Therapeutic Activity: 23-37 mins                      Becky Sax, LPTA/CLT; CBIS [phone]  Juel Burrow 09/01/2023, 11:51 AM

## 2023-09-01 NOTE — Progress Notes (Signed)
*  PRELIMINARY RESULTS* Echocardiogram 2D Echocardiogram has been performed.  Stacey Drain 09/01/2023, 11:56 AM

## 2023-09-01 NOTE — Plan of Care (Signed)
  Problem: Clinical Measurements: Goal: Ability to maintain clinical measurements within normal limits will improve Outcome: Progressing Goal: Diagnostic test results will improve Outcome: Progressing Goal: Cardiovascular complication will be avoided Outcome: Progressing   Problem: Activity: Goal: Risk for activity intolerance will decrease Outcome: Progressing   Problem: Nutrition: Goal: Adequate nutrition will be maintained Outcome: Progressing   Problem: Elimination: Goal: Will not experience complications related to urinary retention Outcome: Progressing   Problem: Pain Management: Goal: General experience of comfort will improve Outcome: Progressing   Problem: Safety: Goal: Ability to remain free from injury will improve Outcome: Progressing

## 2023-09-01 NOTE — Progress Notes (Signed)
Pt straining to have bowel movement.  Scant blood noted on toilet tissue.  PRN Dulcolax PO given.  Promoted pt to finish drinking Miralax to aid with constipation.

## 2023-09-01 NOTE — Progress Notes (Signed)
PROGRESS NOTE  Jeff Miles YNW:[id number] DOB: 11/23/1950 DOA: 08/30/2023 PCP: Benita Stabile, MD  Brief History:  72 y.o. male with a PMH significant for dementia, COPD, HTN, CHF, anemia, type II DM, anxiety, GERD.  At baseline, they live at home with his wife and are ambulatory but moderate dementia.   They presented from home to the ED on 08/30/2023 with AMS. Difficult to pinpoint onset due to reported fluctuation of baseline mental status from family members due to baseline dementia and [place]off but overall report about 3 days of worsening confusion. He also had a fall today which resulted in a head injury. He had been ambulating abnormally for the three days with right leg dragging behind left and moving at slower pace.   They describe urinary hesitancy and are unsure of his UOP volume since he goes to the bathroom independently. He has been eating normally and not complaining of acute pain, nausea, vomiting.   Patient is unable to provide his own history.  He has chronic tongue fasciculations but this has been worse since today.   In the ED, it was found that they had significant disorientation and agitation. He also had elevated HR to 140s which appeared to be afib/flutter on bedside ECG. After 2 doses of lopressor IV, his repeat ECG showed sinus rhythm at 98 HR. Had urinary retention and required foley placed which resulted in 1453cc UOP.  Also noted to have temp of 101.5 and oxygen requirement of 3L [place] due to desat to 88% on room air. Blood pressures significantly elevated to 180s systolics which improved to 140s with lopressor.  Other significant findings included: WBC 10.8, hemoglobin 10.2, platelets 267.  Mg++ 1.3, K+ 3.2.  Troponin 108>109.  BNP 57.  Urinalysis positive for moderate hemoglobin, ketones, 100 protein, rare bacteria, negative nitrites and leukocytes. Chest xray negative for active disease.  Hip xray: negative for fracture Head CT: negative acute intracranial  abnormality    They were initially treated with potassium, magnesium, haldol, and ativan in the ED.   Patient was admitted to medicine service for further workup and management of AMS as outlined in detail below.   Assessment/Plan: Acute metabolic encephalopathy - MRI brain--negnegative for acute findings - mentation near baseline now - suspect dehydration and urinary retention were etiology - resume home behavioral health medications - B12 723, Folate 29.3 -TSH 1.673 -UA--neg for pyuria   Acute urinary retention  - discontinue foley 12/19 - continue tamsulosin daily    Tachycardia - appreciate cardiology--no convinced he had aflutter - continue metoprolol 25 mg BID with hold parameters added - anticoagulation held due to ongoing GI workup for GI bleeding, he is anemic now and reporting paper hematochezia, Hg trending down from recent tests; he needs colonoscopy (last one in Oct 24 prep was extremely poor)   Hypokalemia - oral replacement ordered - recheck in AM    Microcytic anemia/iron deficiency - iron saturation 3%, ferritin 19   COPD -stable on RA -start duoneb      Family Communication:  spouse updated 12/19  Consultants:  cardiology  Code Status:  FULL  DVT Prophylaxis:   [place] Lovenox   Procedures: As Listed in Progress Note Above  Antibiotics: Ceftriaxone 12/17>>       Subjective: Patient denies fevers, chills, headache, chest pain, dyspnea, nausea, vomiting, diarrhea, abdominal pain, dysuria, hematuria, hematochezia, and melena.   Objective: Vitals:   09/01/23 0938 09/01/23 1004 09/01/23 1100 09/01/23  1200  BP: (!) 145/86 131/67 107/60 (!) 140/79  Pulse:  (!) 103 84 83  Resp:  (!) 29 (!) 24 (!) 21  Temp:      TempSrc:      SpO2:  99% 99% 100%  Weight:      Height:        Intake/Output Summary (Last 24 hours) at 09/01/2023 1319 Last data filed at 09/01/2023 1100 Gross per 24 hour  Intake 720 ml  Output 975 ml  Net -255 ml    Weight change:  Exam:  General:  Pt is alert, follows commands appropriately, not in acute distress HEENT: No icterus, No thrush, No neck mass, [place]/AT Cardiovascular: RRR, S1/S2, no rubs, no gallops Respiratory: bibasilar rales. No wheeze Abdomen: Soft/+BS, non tender, non distended, no guarding Extremities: No edema, No lymphangitis, No petechiae, No rashes, no synovitis   Data Reviewed: I have personally reviewed following labs and imaging studies Basic Metabolic Panel: Recent Labs  Lab 08/30/23 1128 08/31/23 0428 09/01/23 0450  NA 137 141 140  K 3.2* 3.1* 3.9  CL 99 103 105  CO2 28 25 22   GLUCOSE 110* 124* 160*  BUN 13 9 8   CREATININE 1.05 0.83 0.91  CALCIUM 9.3 9.1 9.3  MG 1.3*  --  1.6*   Liver Function Tests: Recent Labs  Lab 08/30/23 1128  AST 56*  ALT 21  ALKPHOS 73  BILITOT 0.9  PROT 7.2  ALBUMIN 3.6   No results for input(s): "LIPASE", "AMYLASE" in the last 168 hours. No results for input(s): "AMMONIA" in the last 168 hours. Coagulation Profile: Recent Labs  Lab 08/30/23 2000  INR 1.1   CBC: Recent Labs  Lab 08/30/23 1128 08/31/23 0428 09/01/23 0450  WBC 10.8* 8.1 10.1  HGB 10.2* 9.2* 10.7*  HCT 32.4* 29.4* 34.2*  MCV 89.0 90.7 89.5  PLT 267 230 264   Cardiac Enzymes: No results for input(s): "CKTOTAL", "CKMB", "CKMBINDEX", "TROPONINI" in the last 168 hours. BNP: Invalid input(s): "POCBNP" CBG: Recent Labs  Lab 08/31/23 1605 08/31/23 2017 09/01/23 0433 09/01/23 0735 09/01/23 1133  GLUCAP 150* 118* 159* 142* 142*   HbA1C: Recent Labs    08/30/23 1127  HGBA1C 5.5   Urine analysis:    Component Value Date/Time   COLORURINE YELLOW 08/30/2023 1443   APPEARANCEUR CLEAR 08/30/2023 1443   LABSPEC 1.008 08/30/2023 1443   PHURINE 7.0 08/30/2023 1443   GLUCOSEU NEGATIVE 08/30/2023 1443   HGBUR MODERATE (A) 08/30/2023 1443   BILIRUBINUR NEGATIVE 08/30/2023 1443   KETONESUR 20 (A) 08/30/2023 1443   PROTEINUR 100 (A) 08/30/2023  1443   NITRITE NEGATIVE 08/30/2023 1443   LEUKOCYTESUR NEGATIVE 08/30/2023 1443   Sepsis Labs: @LABRCNTIP (procalcitonin:4,lacticidven:4) ) Recent Results (from the past 240 hours)  MRSA Next Gen by PCR, Nasal     Status: None   Collection Time: 08/30/23  6:15 PM   Specimen: Nasal Mucosa; Nasal Swab  Result Value Ref Range Status   MRSA by PCR Next Gen NOT DETECTED NOT DETECTED Final    Comment: (NOTE) The GeneXpert MRSA Assay (FDA approved for NASAL specimens only), is one component of a comprehensive MRSA colonization surveillance program. It is not intended to diagnose MRSA infection nor to guide or monitor treatment for MRSA infections. Test performance is not FDA approved in patients less than 41 years old. Performed at [facility], [address], [place], [place] [address]   Culture, blood (Routine X 2) w Reflex to ID Panel     Status:  None (Preliminary result)   Collection Time: 08/30/23  8:00 PM   Specimen: BLOOD  Result Value Ref Range Status   Specimen Description BLOOD BLOOD LEFT HAND  Final   Special Requests   Final    BOTTLES DRAWN AEROBIC AND ANAEROBIC Blood Culture adequate volume   Culture   Final    NO GROWTH 2 DAYS Performed at [facility], [address], [place], [place] [address]    Report Status PENDING  Incomplete  Culture, blood (Routine X 2) w Reflex to ID Panel     Status: None (Preliminary result)   Collection Time: 08/30/23  8:02 PM   Specimen: BLOOD  Result Value Ref Range Status   Specimen Description BLOOD BLOOD LEFT HAND  Final   Special Requests   Final    BOTTLES DRAWN AEROBIC AND ANAEROBIC Blood Culture adequate volume   Culture   Final    NO GROWTH 2 DAYS Performed at [facility], [address], [place], [place] [address]    Report Status PENDING  Incomplete     Scheduled Meds:  Chlorhexidine Gluconate Cloth  6 each Topical Daily   enoxaparin (LOVENOX) injection  50 mg Subcutaneous Q24H   insulin aspart  0-9 Units Subcutaneous  TID WC   lisinopril  10 mg Oral Daily   metoprolol tartrate  25 mg Oral BID   polyethylene glycol  17 g Oral Daily   risperiDONE  0.5 mg Oral QHS   tamsulosin  0.4 mg Oral Daily   thiamine  250 mg Oral Daily   Continuous Infusions:  cefTRIAXone (ROCEPHIN)  IV 1 g (08/31/23 1922)    Procedures/Studies: MR BRAIN W WO CONTRAST Result Date: 08/31/2023 CLINICAL DATA:  Stroke suspected EXAM: MRI HEAD WITHOUT AND WITH CONTRAST TECHNIQUE: Multiplanar, multiecho pulse sequences of the brain and surrounding structures were obtained without and with intravenous contrast. CONTRAST:  10mL GADAVIST GADOBUTROL 1 MMOL/ML IV SOLN COMPARISON:  None Available. FINDINGS: Brain: No restricted diffusion to suggest acute or subacute infarct. No abnormal parenchymal or meningeal enhancement. No acute hemorrhage, mass, mass effect, or midline shift. No hydrocephalus or extra-axial collection. Pituitary and craniocervical junction within normal limits. No hemosiderin deposition to suggest remote hemorrhage. Scattered T2 hyperintense signal in the periventricular white matter, likely the sequela of mild-to-moderate chronic small vessel ischemic disease. Mildly advanced central volume loss for age. Vascular: Normal arterial flow voids. Normal arterial and venous enhancement. Skull and upper cervical spine: Normal marrow signal. Sinuses/Orbits: Mucosal thickening in the ethmoid air cells and left sphenoid sinus. No acute finding in the orbits. Other: The mastoid air cells are well aerated. IMPRESSION: No acute intracranial process. No evidence of acute or subacute infarct. Electronically Signed   By: Wiliam Ke M.D.   On: 08/31/2023 11:34   DG HIPS BILAT WITH PELVIS MIN 5 VIEWS Result Date: 08/30/2023 CLINICAL DATA:  [id number] Fall [id number].  Difficulty walking. EXAM: DG HIP (WITH OR WITHOUT PELVIS) 5+V BILAT COMPARISON:  None Available. FINDINGS: Pelvis is intact with normal and symmetric sacroiliac joints. No acute fracture  or dislocation. No aggressive osseous lesion. Probable old/healed right greater trochanter avulsion fracture noted. Visualized sacral arcuate lines are unremarkable. There are changes of chronic pubic symphisitis. There are mild degenerative changes of bilateral hip joints without significant joint space narrowing. Osteophytosis of the superior acetabulum. No radiopaque foreign bodies. IMPRESSION: *No acute osseous abnormality of the pelvis or bilateral hip joints. Electronically Signed   By: Jules Schick M.D.   On: 08/30/2023 14:08  DG Chest 1 View Result Date: 08/30/2023 CLINICAL DATA:  Weakness.  Frequent falls and difficulty walking. EXAM: CHEST  1 VIEW COMPARISON:  11/03/2015. FINDINGS: Bilateral lung fields are clear. Bilateral costophrenic angles are clear. Normal cardio-mediastinal silhouette. No acute osseous abnormalities. The soft tissues are within normal limits. IMPRESSION: No active disease. Electronically Signed   By: Jules Schick M.D.   On: 08/30/2023 14:06   CT Head Wo Contrast Result Date: 08/30/2023 CLINICAL DATA:  Frequent falls and difficulty walking EXAM: CT HEAD WITHOUT CONTRAST TECHNIQUE: Contiguous axial images were obtained from the base of the skull through the vertex without intravenous contrast. RADIATION DOSE REDUCTION: This exam was performed according to the departmental dose-optimization program which includes automated exposure control, adjustment of the mA and/or kV according to patient size and/or use of iterative reconstruction technique. COMPARISON:  None Available. FINDINGS: Brain: No evidence of acute infarction, hemorrhage, mass, mass effect, or midline shift. No hydrocephalus or extra-axial fluid collection. Vascular: No hyperdense vessel. Skull: Negative for fracture or focal lesion. Sinuses/Orbits: Mucosal thickening in the left sphenoid sinus. No acute finding in the orbits. Fluid in the pneumatized portion of the bilateral mastoid air cells, as well as in  the left greater than right middle ear. IMPRESSION: 1. No acute intracranial process. 2. Fluid in the pneumatized portion of the bilateral mastoid air cells, as well as in the left greater than right middle ear. Correlate for symptoms of otomastoiditis or otitis media. Electronically Signed   By: Wiliam Ke M.D.   On: 08/30/2023 12:34    Catarina Hartshorn, DO  [facility]  If 7PM-7AM, please contact night-coverage [email or web address] Password [email or web address] 09/01/2023, 1:19 PM   LOS: 2 days

## 2023-09-02 ENCOUNTER — Inpatient Hospital Stay (HOSPITAL_COMMUNITY): Payer: Medicare HMO

## 2023-09-02 DIAGNOSIS — R000 Tachycardia, unspecified: Secondary | ICD-10-CM

## 2023-09-02 DIAGNOSIS — J441 Chronic obstructive pulmonary disease with (acute) exacerbation: Secondary | ICD-10-CM | POA: Diagnosis not present

## 2023-09-02 DIAGNOSIS — J9601 Acute respiratory failure with hypoxia: Secondary | ICD-10-CM | POA: Insufficient documentation

## 2023-09-02 DIAGNOSIS — G9341 Metabolic encephalopathy: Secondary | ICD-10-CM | POA: Diagnosis not present

## 2023-09-02 LAB — BLOOD GAS, VENOUS
Acid-Base Excess: 2.2 mmol/L — ABNORMAL HIGH (ref 0.0–2.0)
Bicarbonate: 27.8 mmol/L (ref 20.0–28.0)
Drawn by: 4237
O2 Saturation: 82.2 %
Patient temperature: 37
pCO2, Ven: 46 mm[Hg] (ref 44–60)
pH, Ven: 7.39 (ref 7.25–7.43)
pO2, Ven: 49 mm[Hg] — ABNORMAL HIGH (ref 32–45)

## 2023-09-02 LAB — RESPIRATORY PANEL BY PCR

## 2023-09-02 LAB — BASIC METABOLIC PANEL
Anion gap: 9 (ref 5–15)
BUN: 8 mg/dL (ref 8–23)
CO2: 24 mmol/L (ref 22–32)
Calcium: 9 mg/dL (ref 8.9–10.3)
Chloride: 106 mmol/L (ref 98–111)
Creatinine, Ser: 0.82 mg/dL (ref 0.61–1.24)
GFR, Estimated: >60 mL/min (ref >60)
Glucose, Bld: 146 mg/dL — ABNORMAL HIGH (ref 70–99)
Potassium: 3.7 mmol/L (ref 3.5–5.1)
Sodium: 139 mmol/L (ref 135–145)

## 2023-09-02 LAB — SARS CORONAVIRUS 2 BY RT PCR: SARS Coronavirus 2 by RT PCR: NEGATIVE

## 2023-09-02 LAB — GLUCOSE, CAPILLARY
Glucose-Capillary: 143 mg/dL — ABNORMAL HIGH (ref 70–99)
Glucose-Capillary: 146 mg/dL — ABNORMAL HIGH (ref 70–99)
Glucose-Capillary: 152 mg/dL — ABNORMAL HIGH (ref 70–99)
Glucose-Capillary: 201 mg/dL — ABNORMAL HIGH (ref 70–99)
Glucose-Capillary: 168 mg/dL — ABNORMAL HIGH (ref 70–99)

## 2023-09-02 LAB — PROCALCITONIN: Procalcitonin: <0.1 ng/mL

## 2023-09-02 LAB — MAGNESIUM: Magnesium: 2 mg/dL (ref 1.7–2.4)

## 2023-09-02 LAB — BRAIN NATRIURETIC PEPTIDE: B Natriuretic Peptide: 171 pg/mL — ABNORMAL HIGH (ref 0.0–100.0)

## 2023-09-02 MED ORDER — METOPROLOL TARTRATE 50 MG PO TABS
50.0000 mg | ORAL_TABLET | Freq: Two times a day (BID) | ORAL | Status: DC
  Administered 2023-09-02 – 2023-09-03 (×3): 50 mg via ORAL
  Filled 2023-09-02 (×3): qty 1

## 2023-09-02 MED ORDER — IPRATROPIUM-ALBUTEROL 0.5-2.5 (3) MG/3ML IN SOLN
3.0000 mL | Freq: Three times a day (TID) | RESPIRATORY_TRACT | Status: DC
  Filled 2023-09-02: qty 3

## 2023-09-02 MED ORDER — LORAZEPAM 0.5 MG PO TABS
0.5000 mg | ORAL_TABLET | Freq: Two times a day (BID) | ORAL | Status: DC
  Administered 2023-09-02 – 2023-09-03 (×3): 0.5 mg via ORAL
  Filled 2023-09-02 (×3): qty 1

## 2023-09-02 MED ORDER — IPRATROPIUM-ALBUTEROL 0.5-2.5 (3) MG/3ML IN SOLN
3.0000 mL | Freq: Four times a day (QID) | RESPIRATORY_TRACT | Status: DC
  Administered 2023-09-02 (×2): 3 mL via RESPIRATORY_TRACT
  Filled 2023-09-02 (×2): qty 3

## 2023-09-02 MED ORDER — BUDESONIDE 0.5 MG/2ML IN SUSP
0.5000 mg | Freq: Two times a day (BID) | RESPIRATORY_TRACT | Status: DC
  Administered 2023-09-02 – 2023-09-03 (×3): 0.5 mg via RESPIRATORY_TRACT
  Filled 2023-09-02 (×3): qty 2

## 2023-09-02 MED ORDER — METHYLPREDNISOLONE SODIUM SUCC 40 MG IJ SOLR
40.0000 mg | Freq: Two times a day (BID) | INTRAMUSCULAR | Status: DC
  Administered 2023-09-02 – 2023-09-03 (×2): 40 mg via INTRAVENOUS
  Filled 2023-09-02 (×2): qty 1

## 2023-09-02 MED ORDER — FUROSEMIDE 10 MG/ML IJ SOLN
40.0000 mg | Freq: Once | INTRAMUSCULAR | Status: AC
  Administered 2023-09-02: 40 mg via INTRAVENOUS
  Filled 2023-09-02: qty 4

## 2023-09-02 MED ORDER — ENOXAPARIN SODIUM 40 MG/0.4ML IJ SOSY
40.0000 mg | PREFILLED_SYRINGE | INTRAMUSCULAR | Status: DC
  Administered 2023-09-02: 40 mg via SUBCUTANEOUS
  Filled 2023-09-02: qty 0.4

## 2023-09-02 NOTE — Care Management Important Message (Signed)
Important Message  Patient Details  Name: RAYNARD BIEGER MRN: [id number] Date of Birth: 1950/10/23   Important Message Given:  Yes - Medicare IM     Corey Harold 09/02/2023, 10:45 AM

## 2023-09-02 NOTE — Plan of Care (Signed)
  Problem: Education: Goal: Knowledge of General Education information will improve Description: Including pain rating scale, medication(s)/side effects and non-pharmacologic comfort measures Outcome: Progressing   Problem: Health Behavior/Discharge Planning: Goal: Ability to manage health-related needs will improve Outcome: Progressing   Problem: Clinical Measurements: Goal: Ability to maintain clinical measurements within normal limits will improve Outcome: Progressing Goal: Will remain free from infection Outcome: Progressing Goal: Diagnostic test results will improve Outcome: Progressing Goal: Respiratory complications will improve Outcome: Progressing Goal: Cardiovascular complication will be avoided Outcome: Progressing   Problem: Activity: Goal: Risk for activity intolerance will decrease Outcome: Progressing   Problem: Nutrition: Goal: Adequate nutrition will be maintained Outcome: Progressing   Problem: Coping: Goal: Level of anxiety will decrease Outcome: Progressing   Problem: Elimination: Goal: Will not experience complications related to bowel motility Outcome: Progressing Goal: Will not experience complications related to urinary retention Outcome: Progressing   Problem: Pain Management: Goal: General experience of comfort will improve Outcome: Progressing   Problem: Safety: Goal: Ability to remain free from injury will improve Outcome: Progressing   Problem: Skin Integrity: Goal: Risk for impaired skin integrity will decrease Outcome: Progressing   Problem: Education: Goal: Ability to describe self-care measures that may prevent or decrease complications (Diabetes Survival Skills Education) will improve Outcome: Progressing Goal: Individualized Educational Video(s) Outcome: Progressing   Problem: Coping: Goal: Ability to adjust to condition or change in health will improve Outcome: Progressing   Problem: Fluid Volume: Goal: Ability to  maintain a balanced intake and output will improve Outcome: Progressing   Problem: Health Behavior/Discharge Planning: Goal: Ability to identify and utilize available resources and services will improve Outcome: Progressing Goal: Ability to manage health-related needs will improve Outcome: Progressing   Problem: Metabolic: Goal: Ability to maintain appropriate glucose levels will improve Outcome: Progressing   Problem: Nutritional: Goal: Maintenance of adequate nutrition will improve Outcome: Progressing Goal: Progress toward achieving an optimal weight will improve Outcome: Progressing   Problem: Skin Integrity: Goal: Risk for impaired skin integrity will decrease Outcome: Progressing   Problem: Tissue Perfusion: Goal: Adequacy of tissue perfusion will improve Outcome: Progressing   Problem: Safety: Goal: Non-violent Restraint(s) Outcome: Progressing

## 2023-09-02 NOTE — Progress Notes (Signed)
SATURATION QUALIFICATIONS: (This note is used to comply with regulatory documentation for home oxygen)   Patient Saturations on Room Air at Rest = 87   Patient Saturations on Room Air while Ambulating = N/A   Patient Saturations on 2 Liters of oxygen while Ambulating = 96%   Please briefly explain why patient needs home oxygen: To maintain 02 sat at 90% or above during ambulation.   Jeff Hartshorn, DO

## 2023-09-02 NOTE — Progress Notes (Signed)
Telemetry reviewed, sinus rhythm and sinus tach. I have not seen convincing evidence of afib or aflutter this admission. Echo shows LVE 60-65%, no WMAs, normal diastolic function, normal RV, mild aortic stenosis mean grad 14 AVA VTI1.74. No additional cardiology workup indicated at this time, we will sign off inpatient care.    Dina Rich MD

## 2023-09-02 NOTE — TOC Progression Note (Signed)
Transition of Care [facility]) - Progression Note    Patient Details  Name: Jeff Miles MRN: [id number] Date of Birth: 31-Mar-1951  Transition of [facility]) CM/SW Contact  Elliot Gault, LCSW Phone Number: 09/02/2023, 1:57 PM  Clinical Narrative:     Per MD, anticipating dc tomorrow. Plan remains for dc home with HH from [place]. Orders in. Per MD, pt will need home O2. Pt's insurance is in network with [employer]. Referral made.   Weekend TOC will follow.  Expected Discharge Plan: Home w Home Health Services Barriers to Discharge: Continued Medical Work up  Expected Discharge Plan and Services In-house Referral: Clinical Social Work   Post Acute Care Choice: Horticulturist, commercial, Home Health Living arrangements for the past 2 months: Single Family Home                 DME Arranged: Oxygen DME Agency: [employer] Date DME Agency Contacted: 09/02/23   Representative spoke with at DME Agency: [facility]ack HH Arranged: PT, OT HH Agency: [facility] Date [facility] Agency Contacted: 08/31/23 Time HH Agency Contacted: 1300 Representative spoke with at [facility] Agency: Kandee Keen   Social Determinants of Health (SDOH) Interventions SDOH Screenings   Food Insecurity: No Food Insecurity (08/30/2023)  Housing: Low Risk  (08/30/2023)  Transportation Needs: No Transportation Needs (08/30/2023)  Utilities: Not At Risk (08/30/2023)  Tobacco Use: Medium Risk (08/30/2023)    Readmission Risk Interventions     No data to display

## 2023-09-02 NOTE — Progress Notes (Signed)
PROGRESS NOTE  Jeff Miles:[id number] DOB: 10/26/1950 DOA: 08/30/2023 PCP: Benita Stabile, MD  Brief History:  72 y.o. male with a PMH significant for dementia, COPD, HTN, CHF, anemia, type II DM, anxiety, GERD.  At baseline, they live at home with his wife and are ambulatory but moderate dementia.   They presented from home to the ED on 08/30/2023 with AMS. Difficult to pinpoint onset due to reported fluctuation of baseline mental status from family members due to baseline dementia and [place]off but overall report about 3 days of worsening confusion. He also had a fall today which resulted in a head injury. He had been ambulating abnormally for the three days with right leg dragging behind left and moving at slower pace.   They describe urinary hesitancy and are unsure of his UOP volume since he goes to the bathroom independently. He has been eating normally and not complaining of acute pain, nausea, vomiting.   Patient is unable to provide his own history.  He has chronic tongue fasciculations but this has been worse since today.   In the ED, it was found that they had significant disorientation and agitation. He also had elevated HR to 140s which appeared to be afib/flutter on bedside ECG. After 2 doses of lopressor IV, his repeat ECG showed sinus rhythm at 98 HR. Had urinary retention and required foley placed which resulted in 1453cc UOP.  Also noted to have temp of 101.5 and oxygen requirement of 3L [place] due to desat to 88% on room air. Blood pressures significantly elevated to 180s systolics which improved to 140s with lopressor.  Other significant findings included: WBC 10.8, hemoglobin 10.2, platelets 267.  Mg++ 1.3, K+ 3.2.  Troponin 108>109.  BNP 57.  Urinalysis positive for moderate hemoglobin, ketones, 100 protein, rare bacteria, negative nitrites and leukocytes. Chest xray negative for active disease.  Hip xray: negative for fracture Head CT: negative acute intracranial  abnormality    They were initially treated with potassium, magnesium, haldol, and ativan in the ED.   Patient was admitted to medicine service for further workup and management of AMS as outlined in detail below.   Assessment/Plan: Acute metabolic encephalopathy - MRI brain--negnegative for acute findings - mentation near baseline now - suspect dehydration and urinary retention were etiology - resume home behavioral health medications - B12 723, Folate 29.3 -TSH 1.673 -UA--neg for pyuria  Acute respiratory failure with hypoxia -due to COPD exacerbation -87% on RA with tachypnea -start duonebs -start pulmicort -check covid -check viral resp panel -repeat CXR   Acute urinary retention  - discontinue foley 12/19>>still difficult retaining -foley placed back 12/20 - continue tamsulosin daily    Tachycardia - appreciate cardiology--not convinced he had aflutter - continue metoprolol 25 mg BID with hold parameters added - anticoagulation held due to ongoing GI workup for GI bleeding, he is anemic now and reporting paper hematochezia, Hg trending down from recent tests; he needs colonoscopy (last one in Oct 24 prep was extremely poor)   Hypokalemia - oral replacement ordered -mag 2.0 - recheck in AM    Microcytic anemia/iron deficiency - iron saturation 3%, ferritin 19             Family Communication:   spouse updated at bedside  Consultants:  cardiology  Code Status:  FULL   DVT Prophylaxis:  [place] Heparin /  Procedures: As Listed in Progress Note Above  Antibiotics: None  Subjective: Patient denies fevers, chills, headache, chest pain, dyspnea, nausea, vomiting, diarrhea, abdominal pain, dysuria, hematuria, hematochezia, and melena. He has nonproductive cough with no hemoptysis  Objective: Vitals:   09/02/23 0500 09/02/23 0600 09/02/23 0700 09/02/23 0815  BP: 126/83 (!) 146/78 (!) 168/98   Pulse: (!) 108 (!) 108 (!) 124   Resp: (!) 31 (!) 22  (!) 36   Temp: 98.3 F (36.8 C)   98.2 F (36.8 C)  TempSrc: Oral   Oral  SpO2: 95% 93% 96%   Weight:      Height:        Intake/Output Summary (Last 24 hours) at 09/02/2023 1231 Last data filed at 09/02/2023 1210 Gross per 24 hour  Intake 142.39 ml  Output 1050 ml  Net -907.61 ml   Weight change:  Exam:  General:  Pt is alert, follows commands appropriately, not in acute distress HEENT: No icterus, No thrush, No neck mass, [place]/AT Cardiovascular: RRR, S1/S2, no rubs, no gallops Respiratory: bilateral rales.  Bibasilar wheeze Abdomen: Soft/+BS, non tender, non distended, no guarding Extremities: trace LE edema, No lymphangitis, No petechiae, No rashes, no synovitis   Data Reviewed: I have personally reviewed following labs and imaging studies Basic Metabolic Panel: Recent Labs  Lab 08/30/23 1128 08/31/23 0428 09/01/23 0450 09/02/23 0502  NA 137 141 140 139  K 3.2* 3.1* 3.9 3.7  CL 99 103 105 106  CO2 28 25 22 24   GLUCOSE 110* 124* 160* 146*  BUN 13 9 8 8   CREATININE 1.05 0.83 0.91 0.82  CALCIUM 9.3 9.1 9.3 9.0  MG 1.3*  --  1.6* 2.0   Liver Function Tests: Recent Labs  Lab 08/30/23 1128  AST 56*  ALT 21  ALKPHOS 73  BILITOT 0.9  PROT 7.2  ALBUMIN 3.6   No results for input(s): "LIPASE", "AMYLASE" in the last 168 hours. No results for input(s): "AMMONIA" in the last 168 hours. Coagulation Profile: Recent Labs  Lab 08/30/23 2000  INR 1.1   CBC: Recent Labs  Lab 08/30/23 1128 08/31/23 0428 09/01/23 0450  WBC 10.8* 8.1 10.1  HGB 10.2* 9.2* 10.7*  HCT 32.4* 29.4* 34.2*  MCV 89.0 90.7 89.5  PLT 267 230 264   Cardiac Enzymes: No results for input(s): "CKTOTAL", "CKMB", "CKMBINDEX", "TROPONINI" in the last 168 hours. BNP: Invalid input(s): "POCBNP" CBG: Recent Labs  Lab 09/01/23 1611 09/01/23 2006 09/02/23 0305 09/02/23 0732 09/02/23 1139  GLUCAP 111* 155* 143* 146* 152*   HbA1C: No results for input(s): "HGBA1C" in the last 72  hours. Urine analysis:    Component Value Date/Time   COLORURINE YELLOW 08/30/2023 1443   APPEARANCEUR CLEAR 08/30/2023 1443   LABSPEC 1.008 08/30/2023 1443   PHURINE 7.0 08/30/2023 1443   GLUCOSEU NEGATIVE 08/30/2023 1443   HGBUR MODERATE (A) 08/30/2023 1443   BILIRUBINUR NEGATIVE 08/30/2023 1443   KETONESUR 20 (A) 08/30/2023 1443   PROTEINUR 100 (A) 08/30/2023 1443   NITRITE NEGATIVE 08/30/2023 1443   LEUKOCYTESUR NEGATIVE 08/30/2023 1443   Sepsis Labs: @LABRCNTIP (procalcitonin:4,lacticidven:4) ) Recent Results (from the past 240 hours)  MRSA Next Gen by PCR, Nasal     Status: None   Collection Time: 08/30/23  6:15 PM   Specimen: Nasal Mucosa; Nasal Swab  Result Value Ref Range Status   MRSA by PCR Next Gen NOT DETECTED NOT DETECTED Final    Comment: (NOTE) The GeneXpert MRSA Assay (FDA approved for NASAL specimens only), is one component of a comprehensive MRSA colonization surveillance program. It  is not intended to diagnose MRSA infection nor to guide or monitor treatment for MRSA infections. Test performance is not FDA approved in patients less than 68 years old. Performed at [facility], [address], [place], [place] [address]   Culture, blood (Routine X 2) w Reflex to ID Panel     Status: None (Preliminary result)   Collection Time: 08/30/23  8:00 PM   Specimen: BLOOD  Result Value Ref Range Status   Specimen Description BLOOD BLOOD LEFT HAND  Final   Special Requests   Final    BOTTLES DRAWN AEROBIC AND ANAEROBIC Blood Culture adequate volume   Culture   Final    NO GROWTH 3 DAYS Performed at [facility], [address] [place], [place] [address]    Report Status PENDING  Incomplete  Culture, blood (Routine X 2) w Reflex to ID Panel     Status: None (Preliminary result)   Collection Time: 08/30/23  8:02 PM   Specimen: BLOOD  Result Value Ref Range Status   Specimen Description BLOOD BLOOD LEFT HAND  Final   Special Requests   Final    BOTTLES DRAWN  AEROBIC AND ANAEROBIC Blood Culture adequate volume   Culture   Final    NO GROWTH 3 DAYS Performed at [facility], [address], [place], [place] [address]    Report Status PENDING  Incomplete     Scheduled Meds:  Chlorhexidine Gluconate Cloth  6 each Topical Daily   enoxaparin (LOVENOX) injection  40 mg Subcutaneous Q24H   insulin aspart  0-9 Units Subcutaneous TID WC   LORazepam  0.5 mg Oral BID   metoprolol tartrate  50 mg Oral BID   polyethylene glycol  17 g Oral Daily   risperiDONE  0.5 mg Oral QHS   senna  2 tablet Oral Daily   tamsulosin  0.4 mg Oral Daily   thiamine  250 mg Oral Daily   Continuous Infusions:  Procedures/Studies: ECHOCARDIOGRAM COMPLETE Result Date: 09/01/2023    ECHOCARDIOGRAM REPORT   Patient Name:   TREVIUS KEMPER Date of Exam: 09/01/2023 Medical Rec #:  [id number]          Height:       69.0 in Accession #:    [id number]         Weight:       218.0 lb Date of Birth:  [birth year]         BSA:          2.143 m Patient Age:    72 years           BP:           107/60 mmHg Patient Gender: M                  HR:           83 bpm. [facility] Procedure: 2D Echo, Cardiac Doppler and Color Doppler Indications:    Congestive Heart Failure I50.9  History:        Patient has prior history of Echocardiogram examinations, most                 recent 10/25/2015. CHF, COPD; Risk Factors:Hypertension and                 Diabetes. AMS (altered mental status), Alcohol withdrawal (HCC).  Sonographer:    Celesta Gentile RCS Referring Phys: [id number] CHELSEY L ANDERSON IMPRESSIONS  1. Left ventricular ejection fraction,  by estimation, is 60 to 65%. The left ventricle has normal function. The left ventricle has no regional wall motion abnormalities. Left ventricular diastolic parameters were normal.  2. Right ventricular systolic function is normal. The right ventricular size is normal. Tricuspid regurgitation signal is inadequate for assessing PA pressure.  3. The  mitral valve is normal in structure. No evidence of mitral valve regurgitation. No evidence of mitral stenosis.  4. The aortic valve has an indeterminant number of cusps. Aortic valve regurgitation is not visualized. Mild aortic valve stenosis. Aortic valve mean gradient measures 14.0 mmHg. Aortic valve Vmax measures 2.44 m/s.  5. The inferior vena cava is dilated in size with >50% respiratory variability, suggesting right atrial pressure of 8 mmHg. Comparison(s): No prior Echocardiogram. FINDINGS  Left Ventricle: Left ventricular ejection fraction, by estimation, is 60 to 65%. The left ventricle has normal function. The left ventricle has no regional wall motion abnormalities. The left ventricular internal cavity size was normal in size. There is  no left ventricular hypertrophy. Left ventricular diastolic parameters were normal. Right Ventricle: The right ventricular size is normal. No increase in right ventricular wall thickness. Right ventricular systolic function is normal. Tricuspid regurgitation signal is inadequate for assessing PA pressure. Left Atrium: Left atrial size was normal in size. Right Atrium: Right atrial size was normal in size. Pericardium: There is no evidence of pericardial effusion. Mitral Valve: The mitral valve is normal in structure. No evidence of mitral valve regurgitation. No evidence of mitral valve stenosis. Tricuspid Valve: The tricuspid valve is normal in structure. Tricuspid valve regurgitation is not demonstrated. No evidence of tricuspid stenosis. Aortic Valve: The aortic valve has an indeterminant number of cusps. Aortic valve regurgitation is not visualized. Mild aortic stenosis is present. Aortic valve mean gradient measures 14.0 mmHg. Aortic valve peak gradient measures 23.8 mmHg. Aortic valve  area, by VTI measures 1.74 cm. Pulmonic Valve: The pulmonic valve was not well visualized. Pulmonic valve regurgitation is not visualized. No evidence of pulmonic stenosis. Aorta:  The aortic root is normal in size and structure. Venous: The inferior vena cava is dilated in size with greater than 50% respiratory variability, suggesting right atrial pressure of 8 mmHg. IAS/Shunts: No atrial level shunt detected by color flow Doppler.  LEFT VENTRICLE PLAX 2D LVIDd:         5.15 cm   Diastology LVIDs:         3.40 cm   LV e' medial:    8.70 cm/s LV PW:         1.10 cm   LV E/e' medial:  11.4 LV IVS:        1.10 cm   LV e' lateral:   7.40 cm/s LVOT diam:     2.30 cm   LV E/e' lateral: 13.4 LV SV:         91 LV SV Index:   42 LVOT Area:     4.15 cm  RIGHT VENTRICLE RV S prime:     10.20 cm/s TAPSE (M-mode): 2.0 cm LEFT ATRIUM             Index        RIGHT ATRIUM           Index LA diam:        3.80 cm 1.77 cm/m   RA Area:     20.70 cm LA Vol (A2C):   86.6 ml 40.41 ml/m  RA Volume:   60.90 ml  28.42 ml/m LA Vol (  A4C):   57.1 ml 26.64 ml/m LA Biplane Vol: 71.5 ml 33.36 ml/m  AORTIC VALVE AV Area (Vmax):    1.80 cm AV Area (Vmean):   1.72 cm AV Area (VTI):     1.74 cm AV Vmax:           244.00 cm/s AV Vmean:          170.500 cm/s AV VTI:            0.523 m AV Peak Grad:      23.8 mmHg AV Mean Grad:      14.0 mmHg LVOT Vmax:         105.65 cm/s LVOT Vmean:        70.550 cm/s LVOT VTI:          0.218 m LVOT/AV VTI ratio: 0.42  AORTA Ao Root diam: 3.30 cm MITRAL VALVE MV Area (PHT): 2.95 cm    SHUNTS MV Decel Time: 257 msec    Systemic VTI:  0.22 m MV E velocity: 99.00 cm/s  Systemic Diam: 2.30 cm MV A velocity: 95.10 cm/s MV E/A ratio:  1.04 Vishnu Priya Mallipeddi Electronically signed by Winfield Rast Mallipeddi Signature Date/Time: 09/01/2023/2:58:45 PM    Final    MR BRAIN W WO CONTRAST Result Date: 08/31/2023 CLINICAL DATA:  Stroke suspected EXAM: MRI HEAD WITHOUT AND WITH CONTRAST TECHNIQUE: Multiplanar, multiecho pulse sequences of the brain and surrounding structures were obtained without and with intravenous contrast. CONTRAST:  10mL GADAVIST GADOBUTROL 1 MMOL/ML IV SOLN  COMPARISON:  None Available. FINDINGS: Brain: No restricted diffusion to suggest acute or subacute infarct. No abnormal parenchymal or meningeal enhancement. No acute hemorrhage, mass, mass effect, or midline shift. No hydrocephalus or extra-axial collection. Pituitary and craniocervical junction within normal limits. No hemosiderin deposition to suggest remote hemorrhage. Scattered T2 hyperintense signal in the periventricular white matter, likely the sequela of mild-to-moderate chronic small vessel ischemic disease. Mildly advanced central volume loss for age. Vascular: Normal arterial flow voids. Normal arterial and venous enhancement. Skull and upper cervical spine: Normal marrow signal. Sinuses/Orbits: Mucosal thickening in the ethmoid air cells and left sphenoid sinus. No acute finding in the orbits. Other: The mastoid air cells are well aerated. IMPRESSION: No acute intracranial process. No evidence of acute or subacute infarct. Electronically Signed   By: Wiliam Ke M.D.   On: 08/31/2023 11:34   DG HIPS BILAT WITH PELVIS MIN 5 VIEWS Result Date: 08/30/2023 CLINICAL DATA:  [id number].  Difficulty walking. EXAM: DG HIP (WITH OR WITHOUT PELVIS) 5+V BILAT COMPARISON:  None Available. FINDINGS: Pelvis is intact with normal and symmetric sacroiliac joints. No acute fracture or dislocation. No aggressive osseous lesion. Probable old/healed right greater trochanter avulsion fracture noted. Visualized sacral arcuate lines are unremarkable. There are changes of chronic pubic symphisitis. There are mild degenerative changes of bilateral hip joints without significant joint space narrowing. Osteophytosis of the superior acetabulum. No radiopaque foreign bodies. IMPRESSION: *No acute osseous abnormality of the pelvis or bilateral hip joints. Electronically Signed   By: Jules Schick M.D.   On: 08/30/2023 14:08   DG Chest 1 View Result Date: 08/30/2023 CLINICAL DATA:  Weakness.  Frequent falls and  difficulty walking. EXAM: CHEST  1 VIEW COMPARISON:  11/03/2015. FINDINGS: Bilateral lung fields are clear. Bilateral costophrenic angles are clear. Normal cardio-mediastinal silhouette. No acute osseous abnormalities. The soft tissues are within normal limits. IMPRESSION: No active disease. Electronically Signed   By: Jules Schick M.D.   On:  08/30/2023 14:06   CT Head Wo Contrast Result Date: 08/30/2023 CLINICAL DATA:  Frequent falls and difficulty walking EXAM: CT HEAD WITHOUT CONTRAST TECHNIQUE: Contiguous axial images were obtained from the base of the skull through the vertex without intravenous contrast. RADIATION DOSE REDUCTION: This exam was performed according to the departmental dose-optimization program which includes automated exposure control, adjustment of the mA and/or kV according to patient size and/or use of iterative reconstruction technique. COMPARISON:  None Available. FINDINGS: Brain: No evidence of acute infarction, hemorrhage, mass, mass effect, or midline shift. No hydrocephalus or extra-axial fluid collection. Vascular: No hyperdense vessel. Skull: Negative for fracture or focal lesion. Sinuses/Orbits: Mucosal thickening in the left sphenoid sinus. No acute finding in the orbits. Fluid in the pneumatized portion of the bilateral mastoid air cells, as well as in the left greater than right middle ear. IMPRESSION: 1. No acute intracranial process. 2. Fluid in the pneumatized portion of the bilateral mastoid air cells, as well as in the left greater than right middle ear. Correlate for symptoms of otomastoiditis or otitis media. Electronically Signed   By: Wiliam Ke M.D.   On: 08/30/2023 12:34    Catarina Hartshorn, DO  [facility]  If 7PM-7AM, please contact night-coverage [email or web address] Password [facility] 09/02/2023, 12:31 PM   LOS: 3 days

## 2023-09-02 NOTE — Plan of Care (Signed)
  Problem: Activity: Goal: Risk for activity intolerance will decrease Outcome: Progressing   Problem: Coping: Goal: Level of anxiety will decrease Outcome: Progressing   Problem: Pain Management: Goal: General experience of comfort will improve Outcome: Progressing

## 2023-09-03 DIAGNOSIS — J9601 Acute respiratory failure with hypoxia: Secondary | ICD-10-CM | POA: Diagnosis not present

## 2023-09-03 DIAGNOSIS — J441 Chronic obstructive pulmonary disease with (acute) exacerbation: Secondary | ICD-10-CM | POA: Diagnosis not present

## 2023-09-03 DIAGNOSIS — E876 Hypokalemia: Secondary | ICD-10-CM | POA: Diagnosis not present

## 2023-09-03 LAB — GLUCOSE, CAPILLARY
Glucose-Capillary: 135 mg/dL — ABNORMAL HIGH (ref 70–99)
Glucose-Capillary: 163 mg/dL — ABNORMAL HIGH (ref 70–99)
Glucose-Capillary: 210 mg/dL — ABNORMAL HIGH (ref 70–99)

## 2023-09-03 LAB — BASIC METABOLIC PANEL
Anion gap: 10 (ref 5–15)
BUN: 13 mg/dL (ref 8–23)
CO2: 26 mmol/L (ref 22–32)
Calcium: 9.2 mg/dL (ref 8.9–10.3)
Chloride: 104 mmol/L (ref 98–111)
Creatinine, Ser: 0.82 mg/dL (ref 0.61–1.24)
GFR, Estimated: >60 mL/min (ref >60)
Glucose, Bld: 147 mg/dL — ABNORMAL HIGH (ref 70–99)
Potassium: 4 mmol/L (ref 3.5–5.1)
Sodium: 140 mmol/L (ref 135–145)

## 2023-09-03 LAB — CBC
HCT: 33 % — ABNORMAL LOW (ref 39.0–52.0)
Hemoglobin: 10.2 g/dL — ABNORMAL LOW (ref 13.0–17.0)
MCH: 27.9 pg (ref 26.0–34.0)
MCHC: 30.9 g/dL (ref 30.0–36.0)
MCV: 90.2 fL (ref 80.0–100.0)
Platelets: 302 10*3/uL (ref 150–400)
RBC: 3.66 MIL/uL — ABNORMAL LOW (ref 4.22–5.81)
RDW: 13.9 % (ref 11.5–15.5)
WBC: 7.1 10*3/uL (ref 4.0–10.5)
nRBC: 0 % (ref 0.0–0.2)

## 2023-09-03 MED ORDER — TAMSULOSIN HCL 0.4 MG PO CAPS: 0.4000 mg | ORAL_CAPSULE | Freq: Every day | ORAL | 1 refills | Status: DC

## 2023-09-03 MED ORDER — METOPROLOL TARTRATE 50 MG PO TABS: 50.0000 mg | ORAL_TABLET | Freq: Two times a day (BID) | ORAL | 2 refills | Status: DC

## 2023-09-03 MED ORDER — PREDNISONE 20 MG PO TABS
50.0000 mg | ORAL_TABLET | Freq: Every day | ORAL | Status: DC
Start: 2023-09-04 — End: 2023-09-03

## 2023-09-03 MED ORDER — RISPERIDONE 1 MG PO TABS: 1.0000 mg | ORAL_TABLET | Freq: Every day | ORAL | 1 refills | Status: DC

## 2023-09-03 MED ORDER — PREDNISONE 50 MG PO TABS: 50.0000 mg | ORAL_TABLET | Freq: Every day | ORAL | 0 refills | Status: DC

## 2023-09-03 MED ORDER — FUROSEMIDE 40 MG PO TABS
40.0000 mg | ORAL_TABLET | Freq: Every day | ORAL | Status: DC
  Administered 2023-09-03: 40 mg via ORAL
  Filled 2023-09-03: qty 1

## 2023-09-03 NOTE — Discharge Summary (Signed)
Physician Discharge Summary   Patient: Jeff Miles MRN: [id number] DOB: Aug 15, 1951  Admit date:     08/30/2023  Discharge date: 09/03/23  Discharge Physician: Onalee Hua Zamariah Seaborn   PCP: Benita Stabile, MD   Recommendations at discharge:   Please follow up with primary care provider within 1-2 weeks  Please repeat BMP and CBC in one week    Hospital Course: 72 y.o. male with a PMH significant for dementia, COPD, HTN, CHF, anemia, type II DM, anxiety, GERD.  At baseline, they live at home with his wife and are ambulatory but moderate dementia.   They presented from home to the ED on 08/30/2023 with AMS. Difficult to pinpoint onset due to reported fluctuation of baseline mental status from family members due to baseline dementia and Korsakoff but overall report about 3 days of worsening confusion. He also had a fall today which resulted in a head injury. He had been ambulating abnormally for the three days with right leg dragging behind left and moving at slower pace.   They describe urinary hesitancy and are unsure of his UOP volume since he goes to the bathroom independently. He has been eating normally and not complaining of acute pain, nausea, vomiting.   Patient is unable to provide his own history.  He has chronic tongue fasciculations but this has been worse since today.   In the ED, it was found that they had significant disorientation and agitation. He also had elevated HR to 140s which appeared to be afib/flutter on bedside ECG. After 2 doses of lopressor IV, his repeat ECG showed sinus rhythm at 98 HR. Had urinary retention and required foley placed which resulted in 1453cc UOP.  Also noted to have temp of 101.5 and oxygen requirement of 3L [place] due to desat to 88% on room air. Blood pressures significantly elevated to 180s systolics which improved to 140s with lopressor.  Other significant findings included: WBC 10.8, hemoglobin 10.2, platelets 267.  Mg++ 1.3, K+ 3.2.  Troponin 108>109.  BNP  57.  Urinalysis positive for moderate hemoglobin, ketones, 100 protein, rare bacteria, negative nitrites and leukocytes. Chest xray negative for active disease.  Hip xray: negative for fracture Head CT: negative acute intracranial abnormality    They were initially treated with potassium, magnesium, haldol, and ativan in the ED.   Patient was admitted to medicine service for further workup and management of AMS as outlined in detail below.  Assessment and Plan: Acute metabolic encephalopathy - MRI brain--negnegative for acute findings - suspect dehydration and urinary retention and COPD exac were etiology - resume home behavioral health medications - B12 723, Folate 29.3 -TSH 1.673 -UA--neg for pyuria -Spouse states that mental status is near baseline at the time of discharge   Acute respiratory failure with hypoxia -due to COPD exacerbation -87% on RA with tachypnea -started duonebs -started pulmicort -check covid--neg -check viral resp panel--positive rhinovirus -repeat CXR--no pulmonary edema or infiltrates -Discharge patient home on 2 L nasal cannula -d/c home with prednisone x 4 more days   Acute urinary retention  - discontinue foley 12/19>>still difficult retaining -foley placed back 12/20 - continue tamsulosin daily  -Outpatient referral to urology for voiding trial   Tachycardia - appreciate cardiology--not convinced he had aflutter - continue metoprolol 50 mg BID with hold parameters added - anticoagulation held due to ongoing GI workup for GI bleeding, he is anemic now and reporting paper hematochezia, Hg trending down from recent tests; he needs colonoscopy (last one in Oct 24  prep was extremely poor) -Improved with metoprolol and treating underlying medical conditions   Hypokalemia - oral replacement ordered -mag 2.0   Microcytic anemia/iron deficiency - iron saturation 3%, ferritin 19 -Start ferrous sulfate  Chronic HFpEF -09/01/2023 echo EF 60 to 65%,  no WMA, normal RVF -Continue furosemide 40 mg daily  Controlled DM2 with hyperglycemia -08/30/23 A1C - 5.5 -novolog sliding scale -resume glipizide after d/c          Consultants: cardiology Procedures performed: none  Disposition: Home Diet recommendation:  Carb modified diet DISCHARGE MEDICATION: Allergies as of 09/03/2023   No Known Allergies      Medication List     STOP taking these medications    lisinopril 10 MG tablet Commonly known as: ZESTRIL       TAKE these medications    albuterol (2.5 MG/3ML) 0.083% nebulizer solution Commonly known as: PROVENTIL Take 2.5 mg by nebulization 3 (three) times daily.   PROAIR HFA IN Inhale 2 puffs into the lungs daily.   atorvastatin 80 MG tablet Commonly known as: LIPITOR Take 80 mg by mouth daily.   cyanocobalamin 1000 MCG tablet Take 1 tablet (1,000 mcg total) by mouth daily.   Fish Oil 1000 MG Caps Take 1,000 mg by mouth daily.   furosemide 40 MG tablet Commonly known as: LASIX Take 1 tablet (40 mg total) by mouth daily.   Fusion Plus Caps Take 1 capsule by mouth daily.   glipiZIDE 5 MG 24 hr tablet Commonly known as: GLUCOTROL XL Take 5 mg by mouth 2 (two) times daily.   LORazepam 1 MG tablet Commonly known as: ATIVAN Take 1 mg by mouth daily.   metFORMIN 1000 MG tablet Commonly known as: GLUCOPHAGE Take 1,000 mg by mouth 2 (two) times daily.   metoprolol tartrate 50 MG tablet Commonly known as: LOPRESSOR Take 1 tablet (50 mg total) by mouth 2 (two) times daily.   mirtazapine 15 MG tablet Commonly known as: REMERON Take 15 mg by mouth at bedtime.   multivitamin with minerals Tabs tablet Take 1 tablet by mouth daily.   naltrexone 50 MG tablet Commonly known as: DEPADE Take 50 mg by mouth daily.   omeprazole 20 MG capsule Commonly known as: PRILOSEC Take 20 mg by mouth daily.   pioglitazone 30 MG tablet Commonly known as: ACTOS Take 30 mg by mouth daily.   predniSONE 50 MG  tablet Commonly known as: DELTASONE Take 1 tablet (50 mg total) by mouth daily with breakfast. Start taking on: September 04, 2023   risperiDONE 1 MG tablet Commonly known as: RISPERDAL Take 1 tablet (1 mg total) by mouth at bedtime. What changed:  medication strength how much to take when to take this   SOOTHE XP OP Apply 1 drop to eye daily as needed (dry eyes).   tamsulosin 0.4 MG Caps capsule Commonly known as: FLOMAX Take 1 capsule (0.4 mg total) by mouth daily. Start taking on: September 04, 2023   Trelegy Ellipta 100-62.5-25 MCG/INH Aepb Generic drug: Fluticasone-Umeclidin-Vilant Inhale 1 puff into the lungs daily.   vitamin B-1 250 MG tablet Take 250 mg by mouth daily.               Durable Medical Equipment  (From admission, onward)           Start     Ordered   09/03/23 1216  For home use only DME lightweight manual wheelchair with seat cushion  Once       Comments: Patient  suffers from gait instability which impairs their ability to perform daily activities like toileting in the home.  A cane will not resolve  issue with performing activities of daily living. A wheelchair will allow patient to safely perform daily activities. Patient is not able to propel themselves in the home using a standard weight wheelchair due to arm weakness. Patient can self propel in the lightweight wheelchair. Length of need 6 months . Accessories: elevating leg rests (ELRs), wheel locks, extensions and anti-tippers.   09/03/23 1216   09/02/23 1345  For home use only DME oxygen  Once       Comments: POC for COPD  Question Answer Comment  Length of Need 6 Months   Mode or (Route) Nasal cannula   Liters per Minute 2   Frequency Continuous (stationary and portable oxygen unit needed)   Oxygen conserving device Yes   Oxygen delivery system Gas      09/02/23 1345            Follow-up Information     [facility] Follow up.   Specialty: Home Health  Services Contact information: [address] [place] [address] [phone]                Discharge Exam: Filed Weights   08/30/23 1006 08/30/23 1800  Weight: 103.8 kg 98.9 kg   HEENT:  [place]/AT, No thrush, no icterus CV:  RRR, no rub, no S3, no S4 Lung:  fine bibasilar crackles.  No wheeze Abd:  soft/+BS, NT Ext:  trace LE edema, no lymphangitis, no synovitis, no rash   Condition at discharge: stable  The results of significant diagnostics from this hospitalization (including imaging, microbiology, ancillary and laboratory) are listed below for reference.   Imaging Studies: DG CHEST PORT 1 VIEW Result Date: 09/02/2023 CLINICAL DATA:  Dyspnea. EXAM: PORTABLE CHEST 1 VIEW COMPARISON:  August 30, 2023. FINDINGS: The heart size and mediastinal contours are within normal limits. Both lungs are clear. The visualized skeletal structures are unremarkable. IMPRESSION: No active disease. Electronically Signed   By: Lupita Raider M.D.   On: 09/02/2023 14:45   ECHOCARDIOGRAM COMPLETE Result Date: 09/01/2023    ECHOCARDIOGRAM REPORT   Patient Name:   PHUOC PELLICANO Date of Exam: 09/01/2023 Medical Rec #:  [id number]          Height:       69.0 in Accession #:    [id number]         Weight:       218.0 lb Date of Birth:  04-Feb-1951         BSA:          2.143 m Patient Age:    72 years           BP:           107/60 mmHg Patient Gender: M                  HR:           83 bpm. [facility] Procedure: 2D Echo, Cardiac Doppler and Color Doppler Indications:    Congestive Heart Failure I50.9  History:        Patient has prior history of Echocardiogram examinations, most                 recent 10/25/2015. CHF, COPD; Risk Factors:Hypertension and  Diabetes. AMS (altered mental status), Alcohol withdrawal (HCC).  Sonographer:    Celesta Gentile RCS Referring Phys: [id number] CHELSEY L ANDERSON IMPRESSIONS  1. Left ventricular ejection fraction, by  estimation, is 60 to 65%. The left ventricle has normal function. The left ventricle has no regional wall motion abnormalities. Left ventricular diastolic parameters were normal.  2. Right ventricular systolic function is normal. The right ventricular size is normal. Tricuspid regurgitation signal is inadequate for assessing PA pressure.  3. The mitral valve is normal in structure. No evidence of mitral valve regurgitation. No evidence of mitral stenosis.  4. The aortic valve has an indeterminant number of cusps. Aortic valve regurgitation is not visualized. Mild aortic valve stenosis. Aortic valve mean gradient measures 14.0 mmHg. Aortic valve Vmax measures 2.44 m/s.  5. The inferior vena cava is dilated in size with >50% respiratory variability, suggesting right atrial pressure of 8 mmHg. Comparison(s): No prior Echocardiogram. FINDINGS  Left Ventricle: Left ventricular ejection fraction, by estimation, is 60 to 65%. The left ventricle has normal function. The left ventricle has no regional wall motion abnormalities. The left ventricular internal cavity size was normal in size. There is  no left ventricular hypertrophy. Left ventricular diastolic parameters were normal. Right Ventricle: The right ventricular size is normal. No increase in right ventricular wall thickness. Right ventricular systolic function is normal. Tricuspid regurgitation signal is inadequate for assessing PA pressure. Left Atrium: Left atrial size was normal in size. Right Atrium: Right atrial size was normal in size. Pericardium: There is no evidence of pericardial effusion. Mitral Valve: The mitral valve is normal in structure. No evidence of mitral valve regurgitation. No evidence of mitral valve stenosis. Tricuspid Valve: The tricuspid valve is normal in structure. Tricuspid valve regurgitation is not demonstrated. No evidence of tricuspid stenosis. Aortic Valve: The aortic valve has an indeterminant number of cusps. Aortic valve  regurgitation is not visualized. Mild aortic stenosis is present. Aortic valve mean gradient measures 14.0 mmHg. Aortic valve peak gradient measures 23.8 mmHg. Aortic valve  area, by VTI measures 1.74 cm. Pulmonic Valve: The pulmonic valve was not well visualized. Pulmonic valve regurgitation is not visualized. No evidence of pulmonic stenosis. Aorta: The aortic root is normal in size and structure. Venous: The inferior vena cava is dilated in size with greater than 50% respiratory variability, suggesting right atrial pressure of 8 mmHg. IAS/Shunts: No atrial level shunt detected by color flow Doppler.  LEFT VENTRICLE PLAX 2D LVIDd:         5.15 cm   Diastology LVIDs:         3.40 cm   LV e' medial:    8.70 cm/s LV PW:         1.10 cm   LV E/e' medial:  11.4 LV IVS:        1.10 cm   LV e' lateral:   7.40 cm/s LVOT diam:     2.30 cm   LV E/e' lateral: 13.4 LV SV:         91 LV SV Index:   42 LVOT Area:     4.15 cm  RIGHT VENTRICLE RV S prime:     10.20 cm/s TAPSE (M-mode): 2.0 cm LEFT ATRIUM             Index        RIGHT ATRIUM           Index LA diam:        3.80 cm 1.77 cm/m  RA Area:     20.70 cm LA Vol (A2C):   86.6 ml 40.41 ml/m  RA Volume:   60.90 ml  28.42 ml/m LA Vol (A4C):   57.1 ml 26.64 ml/m LA Biplane Vol: 71.5 ml 33.36 ml/m  AORTIC VALVE AV Area (Vmax):    1.80 cm AV Area (Vmean):   1.72 cm AV Area (VTI):     1.74 cm AV Vmax:           244.00 cm/s AV Vmean:          170.500 cm/s AV VTI:            0.523 m AV Peak Grad:      23.8 mmHg AV Mean Grad:      14.0 mmHg LVOT Vmax:         105.65 cm/s LVOT Vmean:        70.550 cm/s LVOT VTI:          0.218 m LVOT/AV VTI ratio: 0.42  AORTA Ao Root diam: 3.30 cm MITRAL VALVE MV Area (PHT): 2.95 cm    SHUNTS MV Decel Time: 257 msec    Systemic VTI:  0.22 m MV E velocity: 99.00 cm/s  Systemic Diam: 2.30 cm MV A velocity: 95.10 cm/s MV E/A ratio:  1.04 Vishnu Priya Mallipeddi Electronically signed by Winfield Rast Mallipeddi Signature Date/Time:  09/01/2023/2:58:45 PM    Final    MR BRAIN W WO CONTRAST Result Date: 08/31/2023 CLINICAL DATA:  Stroke suspected EXAM: MRI HEAD WITHOUT AND WITH CONTRAST TECHNIQUE: Multiplanar, multiecho pulse sequences of the brain and surrounding structures were obtained without and with intravenous contrast. CONTRAST:  10mL GADAVIST GADOBUTROL 1 MMOL/ML IV SOLN COMPARISON:  None Available. FINDINGS: Brain: No restricted diffusion to suggest acute or subacute infarct. No abnormal parenchymal or meningeal enhancement. No acute hemorrhage, mass, mass effect, or midline shift. No hydrocephalus or extra-axial collection. Pituitary and craniocervical junction within normal limits. No hemosiderin deposition to suggest remote hemorrhage. Scattered T2 hyperintense signal in the periventricular white matter, likely the sequela of mild-to-moderate chronic small vessel ischemic disease. Mildly advanced central volume loss for age. Vascular: Normal arterial flow voids. Normal arterial and venous enhancement. Skull and upper cervical spine: Normal marrow signal. Sinuses/Orbits: Mucosal thickening in the ethmoid air cells and left sphenoid sinus. No acute finding in the orbits. Other: The mastoid air cells are well aerated. IMPRESSION: No acute intracranial process. No evidence of acute or subacute infarct. Electronically Signed   By: Wiliam Ke M.D.   On: 08/31/2023 11:34   DG HIPS BILAT WITH PELVIS MIN 5 VIEWS Result Date: 08/30/2023 CLINICAL DATA:  [id number] Fall [id number].  Difficulty walking. EXAM: DG HIP (WITH OR WITHOUT PELVIS) 5+V BILAT COMPARISON:  None Available. FINDINGS: Pelvis is intact with normal and symmetric sacroiliac joints. No acute fracture or dislocation. No aggressive osseous lesion. Probable old/healed right greater trochanter avulsion fracture noted. Visualized sacral arcuate lines are unremarkable. There are changes of chronic pubic symphisitis. There are mild degenerative changes of bilateral hip joints without  significant joint space narrowing. Osteophytosis of the superior acetabulum. No radiopaque foreign bodies. IMPRESSION: *No acute osseous abnormality of the pelvis or bilateral hip joints. Electronically Signed   By: Jules Schick M.D.   On: 08/30/2023 14:08   DG Chest 1 View Result Date: 08/30/2023 CLINICAL DATA:  Weakness.  Frequent falls and difficulty walking. EXAM: CHEST  1 VIEW COMPARISON:  11/03/2015. FINDINGS: Bilateral lung fields are clear. Bilateral costophrenic angles are clear. Normal  cardio-mediastinal silhouette. No acute osseous abnormalities. The soft tissues are within normal limits. IMPRESSION: No active disease. Electronically Signed   By: Jules Schick M.D.   On: 08/30/2023 14:06   CT Head Wo Contrast Result Date: 08/30/2023 CLINICAL DATA:  Frequent falls and difficulty walking EXAM: CT HEAD WITHOUT CONTRAST TECHNIQUE: Contiguous axial images were obtained from the base of the skull through the vertex without intravenous contrast. RADIATION DOSE REDUCTION: This exam was performed according to the departmental dose-optimization program which includes automated exposure control, adjustment of the mA and/or kV according to patient size and/or use of iterative reconstruction technique. COMPARISON:  None Available. FINDINGS: Brain: No evidence of acute infarction, hemorrhage, mass, mass effect, or midline shift. No hydrocephalus or extra-axial fluid collection. Vascular: No hyperdense vessel. Skull: Negative for fracture or focal lesion. Sinuses/Orbits: Mucosal thickening in the left sphenoid sinus. No acute finding in the orbits. Fluid in the pneumatized portion of the bilateral mastoid air cells, as well as in the left greater than right middle ear. IMPRESSION: 1. No acute intracranial process. 2. Fluid in the pneumatized portion of the bilateral mastoid air cells, as well as in the left greater than right middle ear. Correlate for symptoms of otomastoiditis or otitis media. Electronically  Signed   By: Wiliam Ke M.D.   On: 08/30/2023 12:34    Microbiology: Results for orders placed or performed during the hospital encounter of 08/30/23  MRSA Next Gen by PCR, Nasal     Status: None   Collection Time: 08/30/23  6:15 PM   Specimen: Nasal Mucosa; Nasal Swab  Result Value Ref Range Status   MRSA by PCR Next Gen NOT DETECTED NOT DETECTED Final    Comment: (NOTE) The GeneXpert MRSA Assay (FDA approved for NASAL specimens only), is one component of a comprehensive MRSA colonization surveillance program. It is not intended to diagnose MRSA infection nor to guide or monitor treatment for MRSA infections. Test performance is not FDA approved in patients less than 26 years old. Performed at [facility], [address], [place], [place] [address]   Culture, blood (Routine X 2) w Reflex to ID Panel     Status: None (Preliminary result)   Collection Time: 08/30/23  8:00 PM   Specimen: BLOOD  Result Value Ref Range Status   Specimen Description BLOOD BLOOD LEFT HAND  Final   Special Requests   Final    BOTTLES DRAWN AEROBIC AND ANAEROBIC Blood Culture adequate volume   Culture   Final    NO GROWTH 4 DAYS Performed at [facility], [address], [place], [place] [address]    Report Status PENDING  Incomplete  Culture, blood (Routine X 2) w Reflex to ID Panel     Status: None (Preliminary result)   Collection Time: 08/30/23  8:02 PM   Specimen: BLOOD  Result Value Ref Range Status   Specimen Description BLOOD BLOOD LEFT HAND  Final   Special Requests   Final    BOTTLES DRAWN AEROBIC AND ANAEROBIC Blood Culture adequate volume   Culture   Final    NO GROWTH 4 DAYS Performed at [facility], [address], [place], [place] [address]    Report Status PENDING  Incomplete  Respiratory (~20 pathogens) panel by PCR     Status: Abnormal   Collection Time: 09/02/23 12:37 PM   Specimen: Nasopharyngeal Swab; Respiratory  Result Value Ref Range Status   Adenovirus NOT DETECTED NOT  DETECTED Final   Coronavirus 229E NOT DETECTED  NOT DETECTED Final    Comment: (NOTE) The Coronavirus on the Respiratory Panel, DOES NOT test for the novel  Coronavirus (2019 nCoV)    Coronavirus HKU1 NOT DETECTED NOT DETECTED Final   Coronavirus NL63 NOT DETECTED NOT DETECTED Final   Coronavirus OC43 NOT DETECTED NOT DETECTED Final   Metapneumovirus NOT DETECTED NOT DETECTED Final   Rhinovirus / Enterovirus DETECTED (A) NOT DETECTED Final   Influenza A NOT DETECTED NOT DETECTED Final   Influenza B NOT DETECTED NOT DETECTED Final   Parainfluenza Virus 1 NOT DETECTED NOT DETECTED Final   Parainfluenza Virus 2 NOT DETECTED NOT DETECTED Final   Parainfluenza Virus 3 NOT DETECTED NOT DETECTED Final   Parainfluenza Virus 4 NOT DETECTED NOT DETECTED Final   Respiratory Syncytial Virus NOT DETECTED NOT DETECTED Final   Bordetella pertussis NOT DETECTED NOT DETECTED Final   Bordetella Parapertussis NOT DETECTED NOT DETECTED Final   Chlamydophila pneumoniae NOT DETECTED NOT DETECTED Final   Mycoplasma pneumoniae NOT DETECTED NOT DETECTED Final    Comment: Performed at [facility], [address] [place], [place] [address]  SARS Coronavirus 2 by RT PCR (hospital order, performed in [facility] hospital lab) *cepheid single result test* Anterior Nasal Swab     Status: None   Collection Time: 09/02/23  1:05 PM   Specimen: Anterior Nasal Swab  Result Value Ref Range Status   SARS Coronavirus 2 by RT PCR NEGATIVE NEGATIVE Final    Comment: (NOTE) SARS-CoV-2 target nucleic acids are NOT DETECTED.  The SARS-CoV-2 RNA is generally detectable in upper and lower respiratory specimens during the acute phase of infection. The lowest concentration of SARS-CoV-2 viral copies this assay can detect is 250 copies / mL. A negative result does not preclude SARS-CoV-2 infection and should not be used as the sole basis for treatment or other patient management decisions.  A negative result may occur  with improper specimen collection / handling, submission of specimen other than nasopharyngeal swab, presence of viral mutation(s) within the areas targeted by this assay, and inadequate number of viral copies (<250 copies / mL). A negative result must be combined with clinical observations, patient history, and epidemiological information.  Fact Sheet for Patients:   RoadLapTop.co.za  Fact Sheet for Healthcare Providers: http://kim-miller.com/  This test is not yet approved or  cleared by the [place] FDA and has been authorized for detection and/or diagnosis of SARS-CoV-2 by FDA under an Emergency Use Authorization (EUA).  This EUA will remain in effect (meaning this test can be used) for the duration of the COVID-19 declaration under Section 564(b)(1) of the Act, 21 U.S.C. section 360bbb-3(b)(1), unless the authorization is terminated or revoked sooner.  Performed at [facility], [address], [place], [place] [address]     Labs: CBC: Recent Labs  Lab 08/30/23 1128 08/31/23 0428 09/01/23 0450 09/03/23 0446  WBC 10.8* 8.1 10.1 7.1  HGB 10.2* 9.2* 10.7* 10.2*  HCT 32.4* 29.4* 34.2* 33.0*  MCV 89.0 90.7 89.5 90.2  PLT 267 230 264 302   Basic Metabolic Panel: Recent Labs  Lab 08/30/23 1128 08/31/23 0428 09/01/23 0450 09/02/23 0502 09/03/23 0446  NA 137 141 140 139 140  K 3.2* 3.1* 3.9 3.7 4.0  CL 99 103 105 106 104  CO2 28 25 22 24 26   GLUCOSE 110* 124* 160* 146* 147*  BUN 13 9 8 8 13   CREATININE 1.05 0.83 0.91 0.82 0.82  CALCIUM 9.3 9.1 9.3 9.0 9.2  MG 1.3*  --  1.6* 2.0  --    Liver Function Tests: Recent Labs  Lab 08/30/23 1128  AST 56*  ALT 21  ALKPHOS 73  BILITOT 0.9  PROT 7.2  ALBUMIN 3.6   CBG: Recent Labs  Lab 09/02/23 1723 09/02/23 2058 09/03/23 0246 09/03/23 0741 09/03/23 1140  GLUCAP 201* 168* 135* 163* 210*    Discharge time spent: greater than 30 minutes.  Signed: Catarina Hartshorn, MD [facility] 09/03/2023

## 2023-09-03 NOTE — TOC Transition Note (Signed)
Transition of Care [facility]) - Discharge Note   Patient Details  Name: Jeff Miles MRN: [id number] Date of Birth: 27-Dec-1950  Transition of Care [facility]) CM/SW Contact:  Catalina Gravel, LCSW Phone Number: 09/03/2023, 12:31 PM   Clinical Narrative:     CSW spoke with MD as pt needs wc and wants to ensure home 02 is set up.  CSW contacted [employer], requested wc delivery to room, advised DC today- assured wc delivery 2 hours or less.  CSW asked to verify if 02 cc set up as spouse on site and I can assist by speaking with her. [employer] representative stated cc information no updated.  Pt. DC today.  No further TOC needs.     Barriers to Discharge: Continued Medical Work up   Patient Goals and CMS Choice Patient states their goals for this hospitalization and ongoing recovery are:: return home   Choice offered to / list presented to : Spouse [place] ownership interest in [facility].provided to::  (n/a)    Discharge Placement                       Discharge Plan and Services Additional resources added to the After Visit Summary for   In-house Referral: Clinical Social Work   Post Acute Care Choice: Horticulturist, commercial, Home Health          DME Arranged: Oxygen DME Agency: [employer] Date DME Agency Contacted: 09/02/23   Representative spoke with at DME Agency: [facility]ack HH Arranged: PT, OT HH Agency: [facility] Date [facility] Agency Contacted: 08/31/23 Time HH Agency Contacted: 1300 Representative spoke with at [facility] Agency: Kandee Keen  Social Drivers of Health (SDOH) Interventions SDOH Screenings   Food Insecurity: No Food Insecurity (08/30/2023)  Housing: Low Risk  (08/30/2023)  Transportation Needs: No Transportation Needs (08/30/2023)  Utilities: Not At Risk (08/30/2023)  Tobacco Use: Medium Risk (08/30/2023)     Readmission Risk Interventions     No data to display

## 2023-09-04 LAB — CULTURE, BLOOD (ROUTINE X 2)
Culture: NO GROWTH
Culture: NO GROWTH
Special Requests: ADEQUATE
Special Requests: ADEQUATE

## 2023-09-08 ENCOUNTER — Telehealth: Payer: Self-pay | Admitting: *Deleted

## 2023-09-08 NOTE — Telephone Encounter (Signed)
Pt's wife Okey Regal (on dpr) called to cancel pt's procedure on 09/28/23.  She states he has a lot going on right now and doesn't think he will be able to do the colonoscopy. She will call back to reschedule.

## 2023-09-09 ENCOUNTER — Other Ambulatory Visit: Payer: Self-pay

## 2023-09-09 ENCOUNTER — Encounter (HOSPITAL_COMMUNITY): Payer: Self-pay

## 2023-09-09 ENCOUNTER — Emergency Department (HOSPITAL_COMMUNITY)
Admission: EM | Admit: 2023-09-09 | Discharge: 2023-09-09 | Disposition: A | Discharge status: Home / Self Care | Payer: Medicare HMO | Attending: Emergency Medicine | Admitting: Emergency Medicine

## 2023-09-09 DIAGNOSIS — Z8659 Personal history of other mental and behavioral disorders: Secondary | ICD-10-CM

## 2023-09-09 DIAGNOSIS — Z7951 Long term (current) use of inhaled steroids: Secondary | ICD-10-CM | POA: Insufficient documentation

## 2023-09-09 DIAGNOSIS — I110 Hypertensive heart disease with heart failure: Secondary | ICD-10-CM | POA: Diagnosis not present

## 2023-09-09 DIAGNOSIS — F05 Delirium due to known physiological condition: Secondary | ICD-10-CM | POA: Diagnosis not present

## 2023-09-09 DIAGNOSIS — G9341 Metabolic encephalopathy: Secondary | ICD-10-CM | POA: Diagnosis not present

## 2023-09-09 DIAGNOSIS — T83098A Other mechanical complication of other indwelling urethral catheter, initial encounter: Secondary | ICD-10-CM | POA: Diagnosis not present

## 2023-09-09 DIAGNOSIS — J449 Chronic obstructive pulmonary disease, unspecified: Secondary | ICD-10-CM | POA: Diagnosis not present

## 2023-09-09 DIAGNOSIS — F03C11 Unspecified dementia, severe, with agitation: Secondary | ICD-10-CM | POA: Diagnosis not present

## 2023-09-09 DIAGNOSIS — I509 Heart failure, unspecified: Secondary | ICD-10-CM | POA: Diagnosis not present

## 2023-09-09 DIAGNOSIS — Z79899 Other long term (current) drug therapy: Secondary | ICD-10-CM | POA: Diagnosis not present

## 2023-09-09 DIAGNOSIS — T83021A Displacement of indwelling urethral catheter, initial encounter: Secondary | ICD-10-CM

## 2023-09-09 DIAGNOSIS — F03C4 Unspecified dementia, severe, with anxiety: Secondary | ICD-10-CM | POA: Diagnosis not present

## 2023-09-09 DIAGNOSIS — F0390 Unspecified dementia without behavioral disturbance: Secondary | ICD-10-CM | POA: Diagnosis not present

## 2023-09-09 DIAGNOSIS — F1097 Alcohol use, unspecified with alcohol-induced persisting dementia: Secondary | ICD-10-CM | POA: Diagnosis not present

## 2023-09-09 DIAGNOSIS — E119 Type 2 diabetes mellitus without complications: Secondary | ICD-10-CM | POA: Diagnosis not present

## 2023-09-09 DIAGNOSIS — T83028A Displacement of other indwelling urethral catheter, initial encounter: Secondary | ICD-10-CM | POA: Diagnosis not present

## 2023-09-09 DIAGNOSIS — Z7984 Long term (current) use of oral hypoglycemic drugs: Secondary | ICD-10-CM | POA: Insufficient documentation

## 2023-09-09 DIAGNOSIS — F03C18 Unspecified dementia, severe, with other behavioral disturbance: Secondary | ICD-10-CM | POA: Diagnosis not present

## 2023-09-09 DIAGNOSIS — J441 Chronic obstructive pulmonary disease with (acute) exacerbation: Secondary | ICD-10-CM | POA: Diagnosis not present

## 2023-09-09 DIAGNOSIS — R82998 Other abnormal findings in urine: Secondary | ICD-10-CM | POA: Insufficient documentation

## 2023-09-09 DIAGNOSIS — J9601 Acute respiratory failure with hypoxia: Secondary | ICD-10-CM | POA: Diagnosis not present

## 2023-09-09 HISTORY — DX: Unspecified hearing loss, unspecified ear: H91.90

## 2023-09-09 HISTORY — DX: Other symptoms and signs involving the musculoskeletal system: R29.898

## 2023-09-09 NOTE — ED Triage Notes (Signed)
Pt here with wife for pulling out his foley catheter, catheter was placed on 12/20, pt recently having prsotate issues. Pt suppose to f/u with urology in a few days outpatient. Wife also reports she is having trouble taking care of him, pt has severe dementia, pt pulling out catheter and continuously remove his [place] as he is on supplemental oxygen at 2L [place] at baseline. Wife reports pt's has on-going weakness, and right leg more weaker then the left so wife has difficulty getting pt around and taking care of him as he is becoming total care, and wife lives alone with husband and does not have much help. GCS 14 at baseline, afebrile, some blood at the meatus from pulling out foley that was clean up by the is RN

## 2023-09-09 NOTE — ED Notes (Addendum)
Wife educated that there appears to be a SW consult and that there is home health and PT/OT with [facility] that should be reaching out soon. Suggested to talk with them regarding inability to full take care of husband at home physically and pt needs additional care life at a rehab or nursing facility and they should be able to evaluate home environment and help with getting services arrange, wife thankful for information, also advised to call urologist today for appt, and monitor pt's output, no output in 6-8 hrs call urologist or bring back to ER, wife reports understanding.   Copied from d/c noted on 09/03/23  Expected Discharge Plan and Services In-house Referral: Clinical Social Work Post Acute Care Choice: Durable Medical Equipment, Home Health Living arrangements for the past 2 months: Single Family Home                       DME Arranged: Oxygen DME Agency: [employer] Date DME Agency Contacted: 09/02/23 Representative spoke with at DME Agency: Zack HH Arranged: PT, OT HH Agency: [facility] Date [facility] Agency Contacted: 08/31/23 Time HH Agency Contacted: 1300 Representative spoke with at [facility] Agency: Kandee Keen

## 2023-09-09 NOTE — ED Notes (Signed)
Pt given a urinal, pt and family instructed on attempting to urinate, both verbalized understanding.

## 2023-09-09 NOTE — Discharge Instructions (Signed)
If he does not void in the next 6 to 8 hours, he will need to be reevaluated for Foley catheter insertion.  Follow-up with your primary doctor regarding concerns for ongoing medical needs including potential nursing home placement.

## 2023-09-09 NOTE — ED Notes (Signed)
NT bladder scan the pt which showed no urine "0 ml" in the bladder, the wife reported pt urinated a lot after he pulled out the foley PTA, pt also urinated in urinal 50 ml prior to bladder scan, Dr. Wilkie Aye notified.

## 2023-09-09 NOTE — ED Provider Notes (Signed)
[place] EMERGENCY DEPARTMENT AT [facility] Provider Note   CSN: [id number] Arrival date & time: 09/09/23  0115     History  Chief Complaint  Patient presents with   Pulled out Catheter  SW Placement    Jeff Miles is a 72 y.o. male.  HPI     This is a 71 year old male with a history of dementia and recent admission with metabolic encephalopathy, COPD, and urinary retention who presents with concern for Foley catheter dislodgment.  Wife is caring for him primarily at home.  Was discharged earlier this week with a Foley catheter in place.  Wife reports that he has been pulling at it and pulled it out.  She does report that he had large-volume urination after the Foley catheter came out.  She also reports that he is pulling and tugging on his oxygen.  She states that she is overwhelmed and having difficulty caring for him at home.  He has not had any fevers.  No abdominal pain, no chest pain, no nausea or vomiting.  He is otherwise at his baseline with the exception of increased agitation at home.  Home Medications Prior to Admission medications   Medication Sig Start Date End Date Taking? Authorizing Provider  albuterol (PROVENTIL) (2.5 MG/3ML) 0.083% nebulizer solution Take 2.5 mg by nebulization 3 (three) times daily.    [provider]  Albuterol Sulfate (PROAIR HFA IN) Inhale 2 puffs into the lungs daily.    [provider]  Artificial Tear Solution (SOOTHE XP OP) Apply 1 drop to eye daily as needed (dry eyes).    [provider]  atorvastatin (LIPITOR) 80 MG tablet Take 80 mg by mouth daily.    [provider]  Fluticasone-Umeclidin-Vilant (TRELEGY ELLIPTA) 100-62.5-25 MCG/INH AEPB Inhale 1 puff into the lungs daily.    Alver Fisher, RN  furosemide (LASIX) 40 MG tablet Take 1 tablet (40 mg total) by mouth daily. 11/11/15   Vassie Loll, MD  glipiZIDE (GLUCOTROL XL) 5 MG 24 hr tablet Take 5 mg by mouth 2 (two) times  daily.    [provider]  Iron-FA-B Cmp-C-Biot-Probiotic (FUSION PLUS) CAPS Take 1 capsule by mouth daily. 04/26/23   [provider]  LORazepam (ATIVAN) 1 MG tablet Take 1 mg by mouth daily.     [provider]  metFORMIN (GLUCOPHAGE) 1000 MG tablet Take 1,000 mg by mouth 2 (two) times daily. 06/20/23   [provider]  metoprolol tartrate (LOPRESSOR) 50 MG tablet Take 1 tablet (50 mg total) by mouth 2 (two) times daily. 09/03/23   Catarina Hartshorn, MD  mirtazapine (REMERON) 15 MG tablet Take 15 mg by mouth at bedtime.    [provider]  Multiple Vitamin (MULTIVITAMIN WITH MINERALS) TABS tablet Take 1 tablet by mouth daily. 11/11/15   Vassie Loll, MD  naltrexone (DEPADE) 50 MG tablet Take 50 mg by mouth daily.    [provider]  Omega-3 Fatty Acids (FISH OIL) 1000 MG CAPS Take 1,000 mg by mouth daily.     [provider]  omeprazole (PRILOSEC) 20 MG capsule Take 20 mg by mouth daily. 08/27/23   [provider]  pioglitazone (ACTOS) 30 MG tablet Take 30 mg by mouth daily.    [provider]  predniSONE (DELTASONE) 50 MG tablet Take 1 tablet (50 mg total) by mouth daily with breakfast. 09/04/23   Tat, Onalee Hua, MD  risperiDONE (RISPERDAL) 1 MG tablet Take 1 tablet (1 mg total) by mouth  at bedtime. 09/03/23   Catarina Hartshorn, MD  tamsulosin (FLOMAX) 0.4 MG CAPS capsule Take 1 capsule (0.4 mg total) by mouth daily. 09/04/23   Catarina Hartshorn, MD  Thiamine HCl (VITAMIN B-1) 250 MG tablet Take 250 mg by mouth daily.    [provider]  vitamin B-12 1000 MCG tablet Take 1 tablet (1,000 mcg total) by mouth daily. 11/11/15   Vassie Loll, MD      Allergies    Patient has no known allergies.    Review of Systems   Review of Systems  Constitutional:  Negative for fever.  Respiratory:  Negative for shortness of breath.   Cardiovascular:  Negative for chest pain.  Genitourinary:  Positive for hematuria.  Psychiatric/Behavioral:   Positive for agitation and confusion.   All other systems reviewed and are negative.   Physical Exam Updated Vital Signs BP (!) 144/62   Pulse 80   Temp 98.2 F (36.8 C) (Oral)   Resp 20   Ht 1.753 m (5\' 9" )   Wt 98.9 kg   SpO2 98%   BMI 32.20 kg/m  Physical Exam Vitals and nursing note reviewed.  Constitutional:      Appearance: He is well-developed. He is not ill-appearing.  HENT:     Head: Normocephalic and atraumatic.  Eyes:     Pupils: Pupils are equal, round, and reactive to light.  Cardiovascular:     Rate and Rhythm: Normal rate and regular rhythm.     Heart sounds: Normal heart sounds. No murmur heard. Pulmonary:     Effort: Pulmonary effort is normal. No respiratory distress.     Breath sounds: Wheezing present.     Comments: Occasional expiratory wheeze, coarse breath sounds in all lung fields, no respiratory distress Abdominal:     Palpations: Abdomen is soft.     Tenderness: There is no abdominal tenderness. There is no rebound.  Genitourinary:    Comments: Per nursing report, blood at the urethral meatus Musculoskeletal:     Cervical back: Neck supple.  Lymphadenopathy:     Cervical: No cervical adenopathy.  Skin:    General: Skin is warm and dry.  Neurological:     Mental Status: He is alert.     Comments: Oriented only to self  Psychiatric:     Comments: Cooperative     ED Results / Procedures / Treatments   Labs (all labs ordered are listed, but only abnormal results are displayed) Labs Reviewed - No data to display  EKG None  Radiology No results found.  Procedures Procedures    Medications Ordered in ED Medications - No data to display  ED Course/ Medical Decision Making/ A&P                                 Medical Decision Making  This patient presents to the ED for concern of Foley catheter dislodgment, this involves an extensive number of treatment options, and is a complaint that carries with it a high risk of  complications and morbidity.  I considered the following differential and admission for this acute, potentially life threatening condition.  The differential diagnosis includes dislodgment, urethral injury  MDM:    This is a 72 year old male who presents after the Foley catheter was pulled out.  He is nontoxic.  Vital signs are reassuring.  He appears at his baseline.  He is cooperative.  Wife does endorse difficulty caring for him  given agitation and ongoing dementia.  Patient voided about 50 cc spontaneously in a urinal and had 0 postvoid residual.  Wife also reported that he had significant volume urination at home.  She wishes to try to keep the Foley catheter out.  Feel this is reasonable.  Foley is likely contributing to ongoing agitation.  Patient was observed for approximately 2 hours.  Do not feel any need to repeat labs as he appears otherwise at his baseline.  I did review social work and PT notes.  Home health is supposed to be at their house first thing in the morning.  I have encouraged the wife to take him home and have home health evaluate him.  I also encouraged her to engage with their primary physician if she wishes to pursue nursing home placement.  Will send message to social work here as well to follow-up.  They are agreeable to plan.  (Labs, imaging, consults)  Labs: I Ordered, and personally interpreted labs.  The pertinent results include: None  Imaging Studies ordered: I ordered imaging studies including none none I independently visualized and interpreted imaging. I agree with the radiologist interpretation  Additional history obtained from chart review.  External records from outside source obtained and reviewed including prior evaluations  Cardiac Monitoring: The patient was maintained on a cardiac monitor.  If on the cardiac monitor, I personally viewed and interpreted the cardiac monitored which showed an underlying rhythm of: Sinus  Reevaluation: After the  interventions noted above, I reevaluated the patient and found that they have :resolved  Social Determinants of Health:  dementia  Disposition: Discharge  Co morbidities that complicate the patient evaluation  Past Medical History:  Diagnosis Date   CHF (congestive heart failure) (HCC)    COPD (chronic obstructive pulmonary disease) (HCC)    Dementia (HCC)    alcohol related   Diabetes (HCC)    Hard of hearing    History of kidney stones    Hypertension    Right leg weakness    Unsteady gait    Weakness of both legs    Wernicke's encephalopathy      Medicines No orders of the defined types were placed in this encounter.   I have reviewed the patients home medicines and have made adjustments as needed  Problem List / ED Course: Problem List Items Addressed This Visit   None Visit Diagnoses       Displacement of Foley catheter, initial encounter (HCC)    -  Primary     History of dementia                       Final Clinical Impression(s) / ED Diagnoses Final diagnoses:  Displacement of Foley catheter, initial encounter (HCC)  History of dementia    Rx / DC Orders ED Discharge Orders     None         Shon Baton, MD 09/09/23 [phone]

## 2023-09-12 DIAGNOSIS — J9601 Acute respiratory failure with hypoxia: Secondary | ICD-10-CM | POA: Diagnosis not present

## 2023-09-12 DIAGNOSIS — I110 Hypertensive heart disease with heart failure: Secondary | ICD-10-CM | POA: Diagnosis not present

## 2023-09-12 DIAGNOSIS — F1097 Alcohol use, unspecified with alcohol-induced persisting dementia: Secondary | ICD-10-CM | POA: Diagnosis not present

## 2023-09-12 DIAGNOSIS — G9341 Metabolic encephalopathy: Secondary | ICD-10-CM | POA: Diagnosis not present

## 2023-09-12 DIAGNOSIS — F03C11 Unspecified dementia, severe, with agitation: Secondary | ICD-10-CM | POA: Diagnosis not present

## 2023-09-12 DIAGNOSIS — J441 Chronic obstructive pulmonary disease with (acute) exacerbation: Secondary | ICD-10-CM | POA: Diagnosis not present

## 2023-09-12 DIAGNOSIS — F05 Delirium due to known physiological condition: Secondary | ICD-10-CM | POA: Diagnosis not present

## 2023-09-12 DIAGNOSIS — F03C4 Unspecified dementia, severe, with anxiety: Secondary | ICD-10-CM | POA: Diagnosis not present

## 2023-09-12 DIAGNOSIS — F03C18 Unspecified dementia, severe, with other behavioral disturbance: Secondary | ICD-10-CM | POA: Diagnosis not present

## 2023-09-15 DIAGNOSIS — J441 Chronic obstructive pulmonary disease with (acute) exacerbation: Secondary | ICD-10-CM | POA: Diagnosis not present

## 2023-09-15 DIAGNOSIS — F1097 Alcohol use, unspecified with alcohol-induced persisting dementia: Secondary | ICD-10-CM | POA: Diagnosis not present

## 2023-09-15 DIAGNOSIS — I110 Hypertensive heart disease with heart failure: Secondary | ICD-10-CM | POA: Diagnosis not present

## 2023-09-15 DIAGNOSIS — F03C4 Unspecified dementia, severe, with anxiety: Secondary | ICD-10-CM | POA: Diagnosis not present

## 2023-09-15 DIAGNOSIS — F05 Delirium due to known physiological condition: Secondary | ICD-10-CM | POA: Diagnosis not present

## 2023-09-15 DIAGNOSIS — F03C11 Unspecified dementia, severe, with agitation: Secondary | ICD-10-CM | POA: Diagnosis not present

## 2023-09-15 DIAGNOSIS — G9341 Metabolic encephalopathy: Secondary | ICD-10-CM | POA: Diagnosis not present

## 2023-09-15 DIAGNOSIS — J9601 Acute respiratory failure with hypoxia: Secondary | ICD-10-CM | POA: Diagnosis not present

## 2023-09-15 DIAGNOSIS — F03C18 Unspecified dementia, severe, with other behavioral disturbance: Secondary | ICD-10-CM | POA: Diagnosis not present

## 2023-09-16 ENCOUNTER — Other Ambulatory Visit: Payer: Self-pay

## 2023-09-16 ENCOUNTER — Emergency Department (HOSPITAL_COMMUNITY): Payer: HMO

## 2023-09-16 ENCOUNTER — Encounter (HOSPITAL_COMMUNITY): Payer: Self-pay

## 2023-09-16 ENCOUNTER — Encounter: Payer: Self-pay | Admitting: Internal Medicine

## 2023-09-16 ENCOUNTER — Inpatient Hospital Stay (HOSPITAL_COMMUNITY)
Admission: EM | Admit: 2023-09-16 | Discharge: 2023-09-30 | DRG: 071 | Disposition: A | Discharge status: Skilled Nursing Facility | Payer: HMO | Attending: Internal Medicine | Admitting: Internal Medicine

## 2023-09-16 DIAGNOSIS — Z9981 Dependence on supplemental oxygen: Secondary | ICD-10-CM

## 2023-09-16 DIAGNOSIS — N400 Enlarged prostate without lower urinary tract symptoms: Secondary | ICD-10-CM | POA: Diagnosis present

## 2023-09-16 DIAGNOSIS — S0990XA Unspecified injury of head, initial encounter: Secondary | ICD-10-CM | POA: Diagnosis not present

## 2023-09-16 DIAGNOSIS — W19XXXA Unspecified fall, initial encounter: Secondary | ICD-10-CM | POA: Diagnosis not present

## 2023-09-16 DIAGNOSIS — Z860100 Personal history of colon polyps, unspecified: Secondary | ICD-10-CM

## 2023-09-16 DIAGNOSIS — Z043 Encounter for examination and observation following other accident: Secondary | ICD-10-CM | POA: Diagnosis not present

## 2023-09-16 DIAGNOSIS — F03918 Unspecified dementia, unspecified severity, with other behavioral disturbance: Secondary | ICD-10-CM | POA: Diagnosis present

## 2023-09-16 DIAGNOSIS — Z515 Encounter for palliative care: Secondary | ICD-10-CM | POA: Diagnosis not present

## 2023-09-16 DIAGNOSIS — I700 Atherosclerosis of aorta: Secondary | ICD-10-CM | POA: Diagnosis not present

## 2023-09-16 DIAGNOSIS — F1096 Alcohol use, unspecified with alcohol-induced persisting amnestic disorder: Secondary | ICD-10-CM | POA: Diagnosis present

## 2023-09-16 DIAGNOSIS — Z66 Do not resuscitate: Secondary | ICD-10-CM | POA: Diagnosis not present

## 2023-09-16 DIAGNOSIS — R627 Adult failure to thrive: Secondary | ICD-10-CM | POA: Diagnosis present

## 2023-09-16 DIAGNOSIS — E512 Wernicke's encephalopathy: Secondary | ICD-10-CM | POA: Diagnosis present

## 2023-09-16 DIAGNOSIS — R451 Restlessness and agitation: Secondary | ICD-10-CM | POA: Diagnosis present

## 2023-09-16 DIAGNOSIS — B9689 Other specified bacterial agents as the cause of diseases classified elsewhere: Secondary | ICD-10-CM | POA: Diagnosis not present

## 2023-09-16 DIAGNOSIS — I959 Hypotension, unspecified: Secondary | ICD-10-CM | POA: Diagnosis present

## 2023-09-16 DIAGNOSIS — N401 Enlarged prostate with lower urinary tract symptoms: Secondary | ICD-10-CM | POA: Diagnosis not present

## 2023-09-16 DIAGNOSIS — K625 Hemorrhage of anus and rectum: Secondary | ICD-10-CM | POA: Diagnosis present

## 2023-09-16 DIAGNOSIS — W07XXXA Fall from chair, initial encounter: Secondary | ICD-10-CM | POA: Diagnosis present

## 2023-09-16 DIAGNOSIS — L8992 Pressure ulcer of unspecified site, stage 2: Secondary | ICD-10-CM | POA: Diagnosis present

## 2023-09-16 DIAGNOSIS — N3289 Other specified disorders of bladder: Secondary | ICD-10-CM | POA: Diagnosis not present

## 2023-09-16 DIAGNOSIS — M6281 Muscle weakness (generalized): Secondary | ICD-10-CM | POA: Diagnosis not present

## 2023-09-16 DIAGNOSIS — Z9181 History of falling: Secondary | ICD-10-CM

## 2023-09-16 DIAGNOSIS — E876 Hypokalemia: Secondary | ICD-10-CM | POA: Diagnosis present

## 2023-09-16 DIAGNOSIS — J4489 Other specified chronic obstructive pulmonary disease: Secondary | ICD-10-CM | POA: Diagnosis present

## 2023-09-16 DIAGNOSIS — J9611 Chronic respiratory failure with hypoxia: Secondary | ICD-10-CM | POA: Diagnosis not present

## 2023-09-16 DIAGNOSIS — J9601 Acute respiratory failure with hypoxia: Secondary | ICD-10-CM | POA: Diagnosis not present

## 2023-09-16 DIAGNOSIS — H9190 Unspecified hearing loss, unspecified ear: Secondary | ICD-10-CM | POA: Diagnosis present

## 2023-09-16 DIAGNOSIS — J449 Chronic obstructive pulmonary disease, unspecified: Secondary | ICD-10-CM | POA: Diagnosis present

## 2023-09-16 DIAGNOSIS — I509 Heart failure, unspecified: Secondary | ICD-10-CM | POA: Diagnosis not present

## 2023-09-16 DIAGNOSIS — I872 Venous insufficiency (chronic) (peripheral): Secondary | ICD-10-CM | POA: Diagnosis present

## 2023-09-16 DIAGNOSIS — Z6829 Body mass index (BMI) 29.0-29.9, adult: Secondary | ICD-10-CM

## 2023-09-16 DIAGNOSIS — I110 Hypertensive heart disease with heart failure: Secondary | ICD-10-CM | POA: Diagnosis not present

## 2023-09-16 DIAGNOSIS — K219 Gastro-esophageal reflux disease without esophagitis: Secondary | ICD-10-CM | POA: Diagnosis present

## 2023-09-16 DIAGNOSIS — B965 Pseudomonas (aeruginosa) (mallei) (pseudomallei) as the cause of diseases classified elsewhere: Secondary | ICD-10-CM | POA: Diagnosis not present

## 2023-09-16 DIAGNOSIS — R531 Weakness: Secondary | ICD-10-CM

## 2023-09-16 DIAGNOSIS — J432 Centrilobular emphysema: Secondary | ICD-10-CM | POA: Diagnosis not present

## 2023-09-16 DIAGNOSIS — I10 Essential (primary) hypertension: Secondary | ICD-10-CM | POA: Diagnosis not present

## 2023-09-16 DIAGNOSIS — G9341 Metabolic encephalopathy: Secondary | ICD-10-CM | POA: Diagnosis not present

## 2023-09-16 DIAGNOSIS — F03C18 Unspecified dementia, severe, with other behavioral disturbance: Secondary | ICD-10-CM | POA: Diagnosis not present

## 2023-09-16 DIAGNOSIS — E119 Type 2 diabetes mellitus without complications: Secondary | ICD-10-CM | POA: Diagnosis present

## 2023-09-16 DIAGNOSIS — J439 Emphysema, unspecified: Secondary | ICD-10-CM | POA: Diagnosis not present

## 2023-09-16 DIAGNOSIS — E860 Dehydration: Secondary | ICD-10-CM | POA: Diagnosis not present

## 2023-09-16 DIAGNOSIS — F411 Generalized anxiety disorder: Secondary | ICD-10-CM | POA: Diagnosis present

## 2023-09-16 DIAGNOSIS — R29898 Other symptoms and signs involving the musculoskeletal system: Secondary | ICD-10-CM | POA: Diagnosis present

## 2023-09-16 DIAGNOSIS — Z7984 Long term (current) use of oral hypoglycemic drugs: Secondary | ICD-10-CM

## 2023-09-16 DIAGNOSIS — J9801 Acute bronchospasm: Secondary | ICD-10-CM | POA: Diagnosis present

## 2023-09-16 DIAGNOSIS — F03C11 Unspecified dementia, severe, with agitation: Secondary | ICD-10-CM | POA: Diagnosis not present

## 2023-09-16 DIAGNOSIS — R2689 Other abnormalities of gait and mobility: Secondary | ICD-10-CM | POA: Diagnosis not present

## 2023-09-16 DIAGNOSIS — N390 Urinary tract infection, site not specified: Secondary | ICD-10-CM | POA: Diagnosis not present

## 2023-09-16 DIAGNOSIS — R4182 Altered mental status, unspecified: Secondary | ICD-10-CM | POA: Diagnosis not present

## 2023-09-16 DIAGNOSIS — J441 Chronic obstructive pulmonary disease with (acute) exacerbation: Secondary | ICD-10-CM | POA: Diagnosis not present

## 2023-09-16 DIAGNOSIS — F02818 Dementia in other diseases classified elsewhere, unspecified severity, with other behavioral disturbance: Secondary | ICD-10-CM | POA: Diagnosis present

## 2023-09-16 DIAGNOSIS — Z87442 Personal history of urinary calculi: Secondary | ICD-10-CM

## 2023-09-16 DIAGNOSIS — F05 Delirium due to known physiological condition: Secondary | ICD-10-CM | POA: Diagnosis not present

## 2023-09-16 DIAGNOSIS — K5730 Diverticulosis of large intestine without perforation or abscess without bleeding: Secondary | ICD-10-CM | POA: Diagnosis not present

## 2023-09-16 DIAGNOSIS — D509 Iron deficiency anemia, unspecified: Secondary | ICD-10-CM | POA: Diagnosis not present

## 2023-09-16 DIAGNOSIS — R195 Other fecal abnormalities: Secondary | ICD-10-CM | POA: Diagnosis present

## 2023-09-16 DIAGNOSIS — F03C4 Unspecified dementia, severe, with anxiety: Secondary | ICD-10-CM | POA: Diagnosis not present

## 2023-09-16 DIAGNOSIS — Z7189 Other specified counseling: Secondary | ICD-10-CM | POA: Diagnosis not present

## 2023-09-16 DIAGNOSIS — K641 Second degree hemorrhoids: Secondary | ICD-10-CM | POA: Diagnosis present

## 2023-09-16 DIAGNOSIS — D72829 Elevated white blood cell count, unspecified: Secondary | ICD-10-CM | POA: Diagnosis present

## 2023-09-16 DIAGNOSIS — R296 Repeated falls: Secondary | ICD-10-CM | POA: Diagnosis not present

## 2023-09-16 DIAGNOSIS — M160 Bilateral primary osteoarthritis of hip: Secondary | ICD-10-CM | POA: Diagnosis not present

## 2023-09-16 DIAGNOSIS — R0609 Other forms of dyspnea: Secondary | ICD-10-CM | POA: Diagnosis not present

## 2023-09-16 DIAGNOSIS — Z1152 Encounter for screening for COVID-19: Secondary | ICD-10-CM | POA: Diagnosis not present

## 2023-09-16 DIAGNOSIS — Z87891 Personal history of nicotine dependence: Secondary | ICD-10-CM

## 2023-09-16 DIAGNOSIS — F1097 Alcohol use, unspecified with alcohol-induced persisting dementia: Secondary | ICD-10-CM | POA: Diagnosis not present

## 2023-09-16 DIAGNOSIS — S199XXA Unspecified injury of neck, initial encounter: Secondary | ICD-10-CM | POA: Diagnosis not present

## 2023-09-16 DIAGNOSIS — R509 Fever, unspecified: Secondary | ICD-10-CM | POA: Diagnosis not present

## 2023-09-16 DIAGNOSIS — R2681 Unsteadiness on feet: Secondary | ICD-10-CM | POA: Diagnosis present

## 2023-09-16 LAB — CBC WITH DIFFERENTIAL/PLATELET
Abs Immature Granulocytes: 0.05 10*3/uL (ref 0.00–0.07)
Basophils Absolute: 0 10*3/uL (ref 0.0–0.1)
Basophils Relative: 0 %
Eosinophils Absolute: 0 10*3/uL (ref 0.0–0.5)
Eosinophils Relative: 0 %
HCT: 34.6 % — ABNORMAL LOW (ref 39.0–52.0)
Hemoglobin: 10.2 g/dL — ABNORMAL LOW (ref 13.0–17.0)
Immature Granulocytes: 0 %
Lymphocytes Relative: 7 %
Lymphs Abs: 0.9 10*3/uL (ref 0.7–4.0)
MCH: 26.4 pg (ref 26.0–34.0)
MCHC: 29.5 g/dL — ABNORMAL LOW (ref 30.0–36.0)
MCV: 89.4 fL (ref 80.0–100.0)
Monocytes Absolute: 0.9 10*3/uL (ref 0.1–1.0)
Monocytes Relative: 7 %
Neutro Abs: 11.1 10*3/uL — ABNORMAL HIGH (ref 1.7–7.7)
Neutrophils Relative %: 86 %
Platelets: 426 10*3/uL — ABNORMAL HIGH (ref 150–400)
RBC: 3.87 MIL/uL — ABNORMAL LOW (ref 4.22–5.81)
RDW: 14.9 % (ref 11.5–15.5)
WBC: 12.9 10*3/uL — ABNORMAL HIGH (ref 4.0–10.5)
nRBC: 0 % (ref 0.0–0.2)

## 2023-09-16 LAB — COMPREHENSIVE METABOLIC PANEL
ALT: 26 U/L (ref 0–44)
AST: 34 U/L (ref 15–41)
Albumin: 3.3 g/dL — ABNORMAL LOW (ref 3.5–5.0)
Alkaline Phosphatase: 69 U/L (ref 38–126)
Anion gap: 11 (ref 5–15)
BUN: 17 mg/dL (ref 8–23)
CO2: 26 mmol/L (ref 22–32)
Calcium: 9.5 mg/dL (ref 8.9–10.3)
Chloride: 104 mmol/L (ref 98–111)
Creatinine, Ser: 1.34 mg/dL — ABNORMAL HIGH (ref 0.61–1.24)
GFR, Estimated: 56 mL/min — ABNORMAL LOW (ref >60)
Glucose, Bld: 106 mg/dL — ABNORMAL HIGH (ref 70–99)
Potassium: 3.3 mmol/L — ABNORMAL LOW (ref 3.5–5.1)
Sodium: 141 mmol/L (ref 135–145)
Total Bilirubin: 0.8 mg/dL (ref 0.0–1.2)
Total Protein: 6.9 g/dL (ref 6.5–8.1)

## 2023-09-16 LAB — URINALYSIS, W/ REFLEX TO CULTURE (INFECTION SUSPECTED)
Bacteria, UA: NONE SEEN
Bilirubin Urine: NEGATIVE
Glucose, UA: NEGATIVE mg/dL
Ketones, ur: NEGATIVE mg/dL
Leukocytes,Ua: NEGATIVE
Nitrite: NEGATIVE
Protein, ur: >=300 mg/dL — AB
Specific Gravity, Urine: 1.017 (ref 1.005–1.030)
pH: 5 (ref 5.0–8.0)

## 2023-09-16 LAB — LACTIC ACID, PLASMA
Lactic Acid, Venous: 3 mmol/L (ref 0.5–1.9)
Lactic Acid, Venous: 1.2 mmol/L (ref 0.5–1.9)

## 2023-09-16 LAB — TROPONIN I (HIGH SENSITIVITY)
Troponin I (High Sensitivity): 23 ng/L — ABNORMAL HIGH (ref <18)
Troponin I (High Sensitivity): 22 ng/L — ABNORMAL HIGH (ref <18)

## 2023-09-16 LAB — RESP PANEL BY RT-PCR (RSV, FLU A&B, COVID)  RVPGX2
Influenza A by PCR: NEGATIVE
Influenza B by PCR: NEGATIVE
Resp Syncytial Virus by PCR: NEGATIVE
SARS Coronavirus 2 by RT PCR: NEGATIVE

## 2023-09-16 LAB — GLUCOSE, CAPILLARY: Glucose-Capillary: 107 mg/dL — ABNORMAL HIGH (ref 70–99)

## 2023-09-16 LAB — POC OCCULT BLOOD, ED: Fecal Occult Bld: POSITIVE — AB

## 2023-09-16 LAB — BRAIN NATRIURETIC PEPTIDE: B Natriuretic Peptide: 41 pg/mL (ref 0.0–100.0)

## 2023-09-16 MED ORDER — ONDANSETRON HCL 4 MG/2ML IJ SOLN: 4.0000 mg | Freq: Four times a day (QID) | INTRAMUSCULAR | Status: DC | PRN

## 2023-09-16 MED ORDER — FENTANYL CITRATE PF 50 MCG/ML IJ SOSY
12.5000 ug | PREFILLED_SYRINGE | INTRAMUSCULAR | Status: DC | PRN
Start: 2023-09-16 — End: 2023-09-30

## 2023-09-16 MED ORDER — TAMSULOSIN HCL 0.4 MG PO CAPS
0.4000 mg | ORAL_CAPSULE | Freq: Every day | ORAL | Status: DC
  Administered 2023-09-17 – 2023-09-30 (×14): 0.4 mg via ORAL
  Filled 2023-09-16 (×14): qty 1

## 2023-09-16 MED ORDER — METOPROLOL TARTRATE 25 MG PO TABS
25.0000 mg | ORAL_TABLET | Freq: Two times a day (BID) | ORAL | Status: DC
  Administered 2023-09-16 – 2023-09-21 (×10): 25 mg via ORAL
  Filled 2023-09-16 (×10): qty 1

## 2023-09-16 MED ORDER — INSULIN ASPART 100 UNIT/ML IJ SOLN
0.0000 [IU] | Freq: Three times a day (TID) | INTRAMUSCULAR | Status: DC
  Administered 2023-09-17: 3 [IU] via SUBCUTANEOUS
  Administered 2023-09-17 (×2): 2 [IU] via SUBCUTANEOUS
  Administered 2023-09-18: 3 [IU] via SUBCUTANEOUS
  Administered 2023-09-18: 2 [IU] via SUBCUTANEOUS
  Administered 2023-09-18 – 2023-09-19 (×2): 3 [IU] via SUBCUTANEOUS
  Administered 2023-09-19: 2 [IU] via SUBCUTANEOUS
  Administered 2023-09-19 – 2023-09-20 (×2): 3 [IU] via SUBCUTANEOUS
  Administered 2023-09-20: 2 [IU] via SUBCUTANEOUS
  Administered 2023-09-20 – 2023-09-21 (×2): 3 [IU] via SUBCUTANEOUS
  Administered 2023-09-21: 2 [IU] via SUBCUTANEOUS
  Administered 2023-09-21 – 2023-09-22 (×2): 3 [IU] via SUBCUTANEOUS
  Administered 2023-09-22: 5 [IU] via SUBCUTANEOUS
  Administered 2023-09-22 – 2023-09-23 (×3): 3 [IU] via SUBCUTANEOUS
  Administered 2023-09-23: 5 [IU] via SUBCUTANEOUS
  Administered 2023-09-24: 3 [IU] via SUBCUTANEOUS
  Administered 2023-09-24: 5 [IU] via SUBCUTANEOUS
  Administered 2023-09-24: 3 [IU] via SUBCUTANEOUS
  Administered 2023-09-25: 5 [IU] via SUBCUTANEOUS
  Administered 2023-09-25: 3 [IU] via SUBCUTANEOUS
  Administered 2023-09-25 – 2023-09-26 (×3): 5 [IU] via SUBCUTANEOUS
  Administered 2023-09-26: 3 [IU] via SUBCUTANEOUS
  Administered 2023-09-27 (×2): 5 [IU] via SUBCUTANEOUS
  Administered 2023-09-27: 8 [IU] via SUBCUTANEOUS
  Administered 2023-09-28 (×2): 5 [IU] via SUBCUTANEOUS
  Administered 2023-09-28: 8 [IU] via SUBCUTANEOUS
  Administered 2023-09-29 – 2023-09-30 (×4): 5 [IU] via SUBCUTANEOUS

## 2023-09-16 MED ORDER — ONDANSETRON HCL 4 MG PO TABS: 4.0000 mg | ORAL_TABLET | Freq: Four times a day (QID) | ORAL | Status: DC | PRN

## 2023-09-16 MED ORDER — VITAMIN B-12 1000 MCG PO TABS
1000.0000 ug | ORAL_TABLET | Freq: Every day | ORAL | Status: DC
  Administered 2023-09-17 – 2023-09-30 (×14): 1000 ug via ORAL
  Filled 2023-09-16 (×14): qty 1

## 2023-09-16 MED ORDER — ATORVASTATIN CALCIUM 40 MG PO TABS
80.0000 mg | ORAL_TABLET | Freq: Every day | ORAL | Status: DC
  Administered 2023-09-17 – 2023-09-28 (×12): 80 mg via ORAL
  Filled 2023-09-16 (×12): qty 2

## 2023-09-16 MED ORDER — MIRTAZAPINE 15 MG PO TABS
15.0000 mg | ORAL_TABLET | Freq: Every day | ORAL | Status: DC
  Administered 2023-09-16 – 2023-09-29 (×13): 15 mg via ORAL
  Filled 2023-09-16 (×14): qty 1

## 2023-09-16 MED ORDER — DOCUSATE SODIUM 100 MG PO CAPS
100.0000 mg | ORAL_CAPSULE | Freq: Two times a day (BID) | ORAL | Status: DC
  Administered 2023-09-16 – 2023-09-25 (×19): 100 mg via ORAL
  Filled 2023-09-16 (×19): qty 1

## 2023-09-16 MED ORDER — ENOXAPARIN SODIUM 40 MG/0.4ML IJ SOSY
40.0000 mg | PREFILLED_SYRINGE | INTRAMUSCULAR | Status: DC
  Administered 2023-09-16 – 2023-09-29 (×14): 40 mg via SUBCUTANEOUS
  Filled 2023-09-16 (×14): qty 0.4

## 2023-09-16 MED ORDER — FUROSEMIDE 40 MG PO TABS
40.0000 mg | ORAL_TABLET | Freq: Every day | ORAL | Status: DC
  Administered 2023-09-17 – 2023-09-30 (×14): 40 mg via ORAL
  Filled 2023-09-16 (×14): qty 1

## 2023-09-16 MED ORDER — POTASSIUM CHLORIDE CRYS ER 20 MEQ PO TBCR
20.0000 meq | EXTENDED_RELEASE_TABLET | Freq: Once | ORAL | Status: AC
  Administered 2023-09-16: 20 meq via ORAL
  Filled 2023-09-16: qty 1

## 2023-09-16 MED ORDER — ACETAMINOPHEN 325 MG PO TABS
650.0000 mg | ORAL_TABLET | Freq: Four times a day (QID) | ORAL | Status: DC | PRN
  Administered 2023-09-22 – 2023-09-27 (×6): 650 mg via ORAL
  Filled 2023-09-16 (×6): qty 2

## 2023-09-16 MED ORDER — LORAZEPAM 0.5 MG PO TABS
0.5000 mg | ORAL_TABLET | Freq: Two times a day (BID) | ORAL | Status: DC | PRN
  Administered 2023-09-18 – 2023-09-27 (×4): 0.5 mg via ORAL
  Filled 2023-09-16 (×4): qty 1

## 2023-09-16 MED ORDER — IOHEXOL 350 MG/ML SOLN
100.0000 mL | Freq: Once | INTRAVENOUS | Status: AC | PRN
  Administered 2023-09-16: 100 mL via INTRAVENOUS

## 2023-09-16 MED ORDER — PANTOPRAZOLE SODIUM 40 MG PO TBEC
40.0000 mg | DELAYED_RELEASE_TABLET | Freq: Every day | ORAL | Status: DC
  Administered 2023-09-17 – 2023-09-30 (×14): 40 mg via ORAL
  Filled 2023-09-16 (×14): qty 1

## 2023-09-16 MED ORDER — ADULT MULTIVITAMIN W/MINERALS CH
1.0000 | ORAL_TABLET | Freq: Every day | ORAL | Status: DC
  Administered 2023-09-17 – 2023-09-30 (×14): 1 via ORAL
  Filled 2023-09-16 (×14): qty 1

## 2023-09-16 MED ORDER — ACETAMINOPHEN 650 MG RE SUPP: 650.0000 mg | Freq: Four times a day (QID) | RECTAL | Status: DC | PRN

## 2023-09-16 MED ORDER — OMEGA-3-ACID ETHYL ESTERS 1 G PO CAPS
1000.0000 mg | ORAL_CAPSULE | Freq: Every day | ORAL | Status: DC
Start: 2023-09-16 — End: 2023-09-30
  Administered 2023-09-16 – 2023-09-30 (×15): 1000 mg via ORAL
  Filled 2023-09-16 (×17): qty 1

## 2023-09-16 MED ORDER — GUAIFENESIN ER 600 MG PO TB12
600.0000 mg | ORAL_TABLET | Freq: Two times a day (BID) | ORAL | Status: AC
  Administered 2023-09-16 – 2023-09-20 (×10): 600 mg via ORAL
  Filled 2023-09-16 (×10): qty 1

## 2023-09-16 MED ORDER — IPRATROPIUM-ALBUTEROL 0.5-2.5 (3) MG/3ML IN SOLN: 3.0000 mL | Freq: Once | RESPIRATORY_TRACT | Status: DC

## 2023-09-16 MED ORDER — FLUTICASONE FUROATE-VILANTEROL 100-25 MCG/ACT IN AEPB
1.0000 | INHALATION_SPRAY | Freq: Every day | RESPIRATORY_TRACT | Status: DC
  Administered 2023-09-17 – 2023-09-30 (×14): 1 via RESPIRATORY_TRACT
  Filled 2023-09-16: qty 28

## 2023-09-16 MED ORDER — OXYCODONE HCL 5 MG PO TABS: 5.0000 mg | ORAL_TABLET | Freq: Four times a day (QID) | ORAL | Status: DC | PRN

## 2023-09-16 MED ORDER — SODIUM CHLORIDE 0.9 % IV BOLUS
500.0000 mL | Freq: Once | INTRAVENOUS | Status: AC
  Administered 2023-09-16: 500 mL via INTRAVENOUS

## 2023-09-16 MED ORDER — THIAMINE MONONITRATE 100 MG PO TABS
250.0000 mg | ORAL_TABLET | Freq: Every day | ORAL | Status: DC
  Administered 2023-09-17 – 2023-09-30 (×14): 250 mg via ORAL
  Filled 2023-09-16 (×13): qty 3

## 2023-09-16 MED ORDER — UMECLIDINIUM BROMIDE 62.5 MCG/ACT IN AEPB: 1.0000 | INHALATION_SPRAY | Freq: Every day | RESPIRATORY_TRACT | Status: DC

## 2023-09-16 MED ORDER — IOHEXOL 300 MG/ML  SOLN
100.0000 mL | Freq: Once | INTRAMUSCULAR | Status: AC | PRN
  Administered 2023-09-16: 100 mL via INTRAVENOUS

## 2023-09-16 MED ORDER — RISPERIDONE 1 MG PO TABS
1.0000 mg | ORAL_TABLET | Freq: Every day | ORAL | Status: DC
  Administered 2023-09-16 – 2023-09-29 (×13): 1 mg via ORAL
  Filled 2023-09-16 (×13): qty 1

## 2023-09-16 MED ORDER — NALTREXONE HCL 50 MG PO TABS
50.0000 mg | ORAL_TABLET | Freq: Every day | ORAL | Status: DC
  Administered 2023-09-17 – 2023-09-30 (×14): 50 mg via ORAL
  Filled 2023-09-16 (×19): qty 1

## 2023-09-16 MED ORDER — IPRATROPIUM-ALBUTEROL 0.5-2.5 (3) MG/3ML IN SOLN
3.0000 mL | Freq: Three times a day (TID) | RESPIRATORY_TRACT | Status: DC
  Administered 2023-09-16 – 2023-09-30 (×41): 3 mL via RESPIRATORY_TRACT
  Filled 2023-09-16 (×41): qty 3

## 2023-09-16 NOTE — ED Provider Notes (Signed)
 [place] EMERGENCY DEPARTMENT AT [facility] Provider Note   CSN: [id number] Arrival date & time: 09/16/23  9077     History  Chief Complaint  Patient presents with   Altered Mental Status    Jeff Miles is a 73 y.o. male with PMHx CHF, COPD, dementia, diabetes, HTN, right leg weakness/unsteady gait who presents to the ED concerned for mechanical fall. Apparently patient told EMS that he slid out of his recliner. Patient's wife stating that she walked into the room after the patient fell and found him facing his chair on the ground - it also appeared that patient hit his head on the ground. Patient denying any pain, but wife stating that he was complaining of non-specific pain when they were sitting him up after the fall. Wife and patient denies any recent infectious concerns. Patient had his foley catheter removed last week and wife states that he has been able to void appropriately since. Wife also states that patient has been compliant on home medications - denies blood thinners. Wife stating that patient has been more altered for around 3 weeks and states that something is not right.  HPI     Home Medications Prior to Admission medications   Medication Sig Start Date End Date Taking? Authorizing Provider  albuterol  (PROVENTIL ) (2.5 MG/3ML) 0.083% nebulizer solution Take 2.5 mg by nebulization 3 (three) times daily.   Yes [provider]  Albuterol  Sulfate (PROAIR  HFA IN) Inhale 2 puffs into the lungs daily.   Yes [provider]  Artificial Tear Solution (SOOTHE XP OP) Apply 1 drop to eye daily as needed (dry eyes).   Yes [provider]  atorvastatin  (LIPITOR) 80 MG tablet Take 80 mg by mouth daily.   Yes [provider]  Fluticasone -Umeclidin-Vilant (TRELEGY ELLIPTA) 100-62.5-25 MCG/INH AEPB Inhale 1 puff into the lungs daily.   Yes Lindner, Jodi N, RN  furosemide  (LASIX ) 40 MG tablet Take 1 tablet (40 mg total) by mouth  daily. 11/11/15  Yes Ricky Fines, MD  glipiZIDE  (GLUCOTROL  XL) 5 MG 24 hr tablet Take 5 mg by mouth 2 (two) times daily.   Yes [provider]  Iron -FA-B Cmp-C-Biot-Probiotic (FUSION PLUS) CAPS Take 1 capsule by mouth daily. 04/26/23  Yes [provider]  LORazepam  (ATIVAN ) 1 MG tablet Take 1 mg by mouth daily.    Yes [provider]  metFORMIN (GLUCOPHAGE) 1000 MG tablet Take 1,000 mg by mouth 2 (two) times daily. 06/20/23  Yes [provider]  metoprolol  tartrate (LOPRESSOR ) 50 MG tablet Take 1 tablet (50 mg total) by mouth 2 (two) times daily. 09/03/23  Yes Tat, Alm, MD  mirtazapine  (REMERON ) 15 MG tablet Take 15 mg by mouth at bedtime.   Yes [provider]  Multiple Vitamin (MULTIVITAMIN WITH MINERALS) TABS tablet Take 1 tablet by mouth daily. 11/11/15  Yes Ricky Fines, MD  naltrexone  (DEPADE) 50 MG tablet Take 50 mg by mouth daily.   Yes [provider]  Omega-3 Fatty Acids (FISH OIL) 1000 MG CAPS Take 1,000 mg by mouth daily.    Yes [provider]  omeprazole (PRILOSEC) 20 MG capsule Take 20 mg by mouth daily. 08/27/23  Yes [provider]  pioglitazone (ACTOS) 30 MG tablet Take 30 mg by mouth daily.   Yes [provider]  risperiDONE  (RISPERDAL ) 1 MG tablet Take 1 tablet (1 mg total) by mouth at bedtime. 09/03/23  Yes Tat, Alm, MD  tamsulosin  (FLOMAX ) 0.4 MG CAPS capsule Take  1 capsule (0.4 mg total) by mouth daily. 09/04/23  Yes Tat, Alm, MD  Thiamine  HCl (VITAMIN B-1) 250 MG tablet Take 250 mg by mouth daily.   Yes [provider]  vitamin B-12 1000 MCG tablet Take 1 tablet (1,000 mcg total) by mouth daily. 11/11/15  Yes Ricky Fines, MD      Allergies    Patient has no known allergies.    Review of Systems   Review of Systems  Musculoskeletal:        Fall    Physical Exam Updated Vital Signs BP (!) 98/59   Pulse 72   Temp 99.8 F (37.7 C) (Rectal)   Resp 19   Ht 5' 9  (1.753 m)   Wt 98.9 kg   SpO2 92%   BMI 32.20 kg/m  Physical Exam Vitals and nursing note reviewed.  Constitutional:      General: He is not in acute distress.    Appearance: He is not ill-appearing or toxic-appearing.  HENT:     Head: Normocephalic and atraumatic.     Mouth/Throat:     Mouth: Mucous membranes are moist.  Eyes:     General: No scleral icterus.       Right eye: No discharge.        Left eye: No discharge.     Conjunctiva/sclera: Conjunctivae normal.  Cardiovascular:     Rate and Rhythm: Normal rate and regular rhythm.     Pulses: Normal pulses.     Heart sounds: Normal heart sounds. No murmur heard. Pulmonary:     Effort: Pulmonary effort is normal. No respiratory distress.     Breath sounds: Rhonchi present. No wheezing or rales.     Comments: Inspiratory and expiratory rhonchi throughout lung fields Abdominal:     General: Abdomen is flat. Bowel sounds are normal. There is no distension.     Palpations: Abdomen is soft. There is no mass.     Tenderness: There is no abdominal tenderness.  Musculoskeletal:     Right lower leg: Edema present.     Left lower leg: Edema present.     Comments: No tenderness of head, neck, shoulder, elbows, wrists, hips, knees, ankles bilaterally. +2 radial pulse. +2 pedal pulse.   3+ pitting edema BL.   Skin:    General: Skin is warm and dry.     Findings: No rash.  Neurological:     General: No focal deficit present.     Mental Status: He is alert. Mental status is at baseline.     Comments: AAOx2  Psychiatric:        Mood and Affect: Mood normal.     ED Results / Procedures / Treatments   Labs (all labs ordered are listed, but only abnormal results are displayed) Labs Reviewed  CBC WITH DIFFERENTIAL/PLATELET - Abnormal; Notable for the following components:      Result Value   WBC 12.9 (*)    RBC 3.87 (*)    Hemoglobin 10.2 (*)    HCT 34.6 (*)    MCHC 29.5 (*)    Platelets 426 (*)    Neutro Abs 11.1 (*)     All other components within normal limits  COMPREHENSIVE METABOLIC PANEL - Abnormal; Notable for the following components:   Potassium 3.3 (*)    Glucose, Bld 106 (*)    Creatinine, Ser 1.34 (*)    Albumin 3.3 (*)    GFR, Estimated 56 (*)    All other components  within normal limits  URINALYSIS, W/ REFLEX TO CULTURE (INFECTION SUSPECTED) - Abnormal; Notable for the following components:   APPearance HAZY (*)    Hgb urine dipstick SMALL (*)    Protein, ur >=300 (*)    All other components within normal limits  LACTIC ACID, PLASMA - Abnormal; Notable for the following components:   Lactic Acid, Venous 3.0 (*)    All other components within normal limits  POC OCCULT BLOOD, ED - Abnormal; Notable for the following components:   Fecal Occult Bld POSITIVE (*)    All other components within normal limits  TROPONIN I (HIGH SENSITIVITY) - Abnormal; Notable for the following components:   Troponin I (High Sensitivity) 23 (*)    All other components within normal limits  TROPONIN I (HIGH SENSITIVITY) - Abnormal; Notable for the following components:   Troponin I (High Sensitivity) 22 (*)    All other components within normal limits  RESP PANEL BY RT-PCR (RSV, FLU A&B, COVID)  [id number]  CULTURE, BLOOD (ROUTINE X 2)  CULTURE, BLOOD (ROUTINE X 2)  BRAIN NATRIURETIC PEPTIDE  LACTIC ACID, PLASMA    EKG EKG Interpretation Date/Time:  Friday September 16 2023 09:41:03 EST Ventricular Rate:  79 PR Interval:  140 QRS Duration:  137 QT Interval:  397 QTC Calculation: 456 R Axis:   -67  Text Interpretation: Sinus rhythm RBBB and LAFB ST elevation, consider lateral injury No significant change since prior 12/24 Confirmed by Towana Sharper [phone]) on 09/16/2023 11:20:05 AM  Radiology CT ABDOMEN PELVIS W CONTRAST Result Date: 09/16/2023 CLINICAL DATA:  Status post fall with head strike and altered mental status. EXAM: CT ANGIOGRAPHY CHEST CT ABDOMEN AND PELVIS WITH CONTRAST TECHNIQUE: Multidetector CT  imaging of the chest was performed using the standard protocol during bolus administration of intravenous contrast. Multiplanar CT image reconstructions and MIPs were obtained to evaluate the vascular anatomy. Multidetector CT imaging of the abdomen and pelvis was performed using the standard protocol during bolus administration of intravenous contrast. RADIATION DOSE REDUCTION: This exam was performed according to the departmental dose-optimization program which includes automated exposure control, adjustment of the mA and/or kV according to patient size and/or use of iterative reconstruction technique. CONTRAST:  OMNIPAQUE  IOHEXOL  350 MG/ML SOLN COMPARISON:  Same day chest radiograph FINDINGS: CTA CHEST FINDINGS Cardiovascular: The study is acceptable for the evaluation of pulmonary embolism, although there is motion artifact. There are no filling defects in the central, lobar, segmental or subsegmental pulmonary artery branches to suggest acute pulmonary embolism. Great vessels are normal in course and caliber. Normal heart size. No significant pericardial fluid/thickening. Coronary artery calcifications. Mediastinum/Nodes: Imaged thyroid gland without nodules meeting criteria for imaging follow-up by size. Normal esophagus. No pathologically enlarged axillary, supraclavicular, mediastinal, or hilar lymph nodes. Lungs/Pleura: The central airways are patent. Mild-to-moderate upper lobe predominant centrilobular emphysema. Moderate diffuse bronchial wall thickening with right lower lobe subsegmental mucous plugging. Bilateral lower lobe subsegmental atelectasis. Irregular medial left upper lobe nodule measures 1.3 x 0.7 cm (6:39). Right apical 3 mm nodule (6:39). No pneumothorax. No pleural effusion. Musculoskeletal: No acute or abnormal lytic or blastic osseous lesions. Multilevel degenerative changes of the thoracic spine. Review of the MIP images confirms the above findings. CT ABDOMEN and PELVIS FINDINGS  Hepatobiliary: No focal hepatic lesions. No intra or extrahepatic biliary ductal dilation. Normal gallbladder. Pancreas: No focal lesions or main ductal dilation. Spleen: Normal in size without focal abnormality. Adrenals/Urinary Tract: 1.4 cm left adrenal nodule measures 57 HU. No right adrenal  nodule. No suspicious renal mass, calculi, or hydronephrosis. Distended urinary bladder. Punctate focus of intraluminal gas. Stomach/Bowel: Normal appearance of the stomach. No evidence of bowel wall thickening, distention, or inflammatory changes. Colonic diverticulosis without acute diverticulitis. Normal appendix. Vascular/Lymphatic: Aortic atherosclerosis. No enlarged abdominal or pelvic lymph nodes. Reproductive: Heterogeneous enhancement of the prostate gland. Rounded hypoattenuating focus within the right hemi gland measures 1.5 cm (2:79), possibly a BPH nodule. Other: No free fluid, fluid collection, or free air. Musculoskeletal: No acute or abnormal lytic or blastic osseous lesions. Multilevel degenerative changes of the lumbar spine. IMPRESSION: CTA CHEST: 1. No evidence of pulmonary embolism. 2. Irregular medial left upper lobe nodule measures 1.3 x 0.7 cm. Consider one of the following in 3 months for both low-risk and high-risk individuals: (a) repeat chest CT, (b) follow-up PET-CT, or (c) tissue sampling. This recommendation follows the consensus statement: Guidelines for Management of Incidental Pulmonary Nodules Detected on CT Images: From the Fleischner Society 2017; Radiology 2017; 284:228-243. 3. Moderate diffuse bronchial wall thickening with right lower lobe subsegmental mucous plugging, as can be seen in the setting of bronchitis. CT ABDOMEN AND PELVIS: 1. No acute abnormality in the abdomen or pelvis. 2. Distended urinary bladder with punctate focus of intraluminal gas, which may be related to recent instrumentation. Correlate with urinalysis to exclude cystitis. 3. Heterogeneous enhancement of the  prostate gland. Rounded hypoattenuating focus within the right hemi gland measures 1.5 cm, possibly a BPH nodule. Recommend correlation with PSA. 4. Indeterminate 1.4 cm left adrenal nodule. Recommend further evaluation with adrenal protocol CT or MRI. 5. Aortic Atherosclerosis (ICD10-I70.0) and Emphysema (ICD10-J43.9). Coronary artery calcifications. Assessment for potential risk factor modification, dietary therapy or pharmacologic therapy may be warranted, if clinically indicated. Electronically Signed   By: Limin  Xu M.D.   On: 09/16/2023 13:17   CT Angio Chest PE W/Cm &/Or Wo Cm Result Date: 09/16/2023 CLINICAL DATA:  Status post fall with head strike and altered mental status. EXAM: CT ANGIOGRAPHY CHEST CT ABDOMEN AND PELVIS WITH CONTRAST TECHNIQUE: Multidetector CT imaging of the chest was performed using the standard protocol during bolus administration of intravenous contrast. Multiplanar CT image reconstructions and MIPs were obtained to evaluate the vascular anatomy. Multidetector CT imaging of the abdomen and pelvis was performed using the standard protocol during bolus administration of intravenous contrast. RADIATION DOSE REDUCTION: This exam was performed according to the departmental dose-optimization program which includes automated exposure control, adjustment of the mA and/or kV according to patient size and/or use of iterative reconstruction technique. CONTRAST:  OMNIPAQUE  IOHEXOL  350 MG/ML SOLN COMPARISON:  Same day chest radiograph FINDINGS: CTA CHEST FINDINGS Cardiovascular: The study is acceptable for the evaluation of pulmonary embolism, although there is motion artifact. There are no filling defects in the central, lobar, segmental or subsegmental pulmonary artery branches to suggest acute pulmonary embolism. Great vessels are normal in course and caliber. Normal heart size. No significant pericardial fluid/thickening. Coronary artery calcifications. Mediastinum/Nodes: Imaged  thyroid gland without nodules meeting criteria for imaging follow-up by size. Normal esophagus. No pathologically enlarged axillary, supraclavicular, mediastinal, or hilar lymph nodes. Lungs/Pleura: The central airways are patent. Mild-to-moderate upper lobe predominant centrilobular emphysema. Moderate diffuse bronchial wall thickening with right lower lobe subsegmental mucous plugging. Bilateral lower lobe subsegmental atelectasis. Irregular medial left upper lobe nodule measures 1.3 x 0.7 cm (6:39). Right apical 3 mm nodule (6:39). No pneumothorax. No pleural effusion. Musculoskeletal: No acute or abnormal lytic or blastic osseous lesions. Multilevel degenerative changes of the thoracic spine.  Review of the MIP images confirms the above findings. CT ABDOMEN and PELVIS FINDINGS Hepatobiliary: No focal hepatic lesions. No intra or extrahepatic biliary ductal dilation. Normal gallbladder. Pancreas: No focal lesions or main ductal dilation. Spleen: Normal in size without focal abnormality. Adrenals/Urinary Tract: 1.4 cm left adrenal nodule measures 57 HU. No right adrenal nodule. No suspicious renal mass, calculi, or hydronephrosis. Distended urinary bladder. Punctate focus of intraluminal gas. Stomach/Bowel: Normal appearance of the stomach. No evidence of bowel wall thickening, distention, or inflammatory changes. Colonic diverticulosis without acute diverticulitis. Normal appendix. Vascular/Lymphatic: Aortic atherosclerosis. No enlarged abdominal or pelvic lymph nodes. Reproductive: Heterogeneous enhancement of the prostate gland. Rounded hypoattenuating focus within the right hemi gland measures 1.5 cm (2:79), possibly a BPH nodule. Other: No free fluid, fluid collection, or free air. Musculoskeletal: No acute or abnormal lytic or blastic osseous lesions. Multilevel degenerative changes of the lumbar spine. IMPRESSION: CTA CHEST: 1. No evidence of pulmonary embolism. 2. Irregular medial left upper lobe nodule  measures 1.3 x 0.7 cm. Consider one of the following in 3 months for both low-risk and high-risk individuals: (a) repeat chest CT, (b) follow-up PET-CT, or (c) tissue sampling. This recommendation follows the consensus statement: Guidelines for Management of Incidental Pulmonary Nodules Detected on CT Images: From the Fleischner Society 2017; Radiology 2017; 284:228-243. 3. Moderate diffuse bronchial wall thickening with right lower lobe subsegmental mucous plugging, as can be seen in the setting of bronchitis. CT ABDOMEN AND PELVIS: 1. No acute abnormality in the abdomen or pelvis. 2. Distended urinary bladder with punctate focus of intraluminal gas, which may be related to recent instrumentation. Correlate with urinalysis to exclude cystitis. 3. Heterogeneous enhancement of the prostate gland. Rounded hypoattenuating focus within the right hemi gland measures 1.5 cm, possibly a BPH nodule. Recommend correlation with PSA. 4. Indeterminate 1.4 cm left adrenal nodule. Recommend further evaluation with adrenal protocol CT or MRI. 5. Aortic Atherosclerosis (ICD10-I70.0) and Emphysema (ICD10-J43.9). Coronary artery calcifications. Assessment for potential risk factor modification, dietary therapy or pharmacologic therapy may be warranted, if clinically indicated. Electronically Signed   By: Limin  Xu M.D.   On: 09/16/2023 13:17   CT Head Wo Contrast Result Date: 09/16/2023 CLINICAL DATA:  Head trauma, minor (Age >= 65y); Neck trauma (Age >= 65y) EXAM: CT HEAD WITHOUT CONTRAST CT CERVICAL SPINE WITHOUT CONTRAST TECHNIQUE: Multidetector CT imaging of the head and cervical spine was performed following the standard protocol without intravenous contrast. Multiplanar CT image reconstructions of the cervical spine were also generated. RADIATION DOSE REDUCTION: This exam was performed according to the departmental dose-optimization program which includes automated exposure control, adjustment of the mA and/or kV according to  patient size and/or use of iterative reconstruction technique. COMPARISON:  None Available. FINDINGS: CT HEAD FINDINGS Brain: No hemorrhage. No hydrocephalus. No extra-axial fluid collection. No CT evidence of an acute cortical infarct. No mass effect. No mass lesion. Vascular: No hyperdense vessel or unexpected calcification. Skull: Normal. Negative for fracture or focal lesion. Sinuses/Orbits: No middle ear or mastoid effusion. Paranasal sinuses notable for mild mucosal thickening in the left maxillary sinus. Orbits are unremarkable. Other: None. CT CERVICAL SPINE FINDINGS Alignment: Normal. Skull base and vertebrae: No acute fracture. No primary bone lesion or focal pathologic process. Soft tissues and spinal canal: No prevertebral fluid or swelling. No visible canal hematoma. Disc levels:  No CT evidence of high-grade spinal canal stenosis Upper chest: Negative. Other: None IMPRESSION: 1. No CT evidence of intracranial injury. 2. No acute fracture or traumatic  subluxation of the cervical spine. Electronically Signed   By: Lyndall Gore M.D.   On: 09/16/2023 13:03   CT Cervical Spine Wo Contrast Result Date: 09/16/2023 CLINICAL DATA:  Head trauma, minor (Age >= 65y); Neck trauma (Age >= 65y) EXAM: CT HEAD WITHOUT CONTRAST CT CERVICAL SPINE WITHOUT CONTRAST TECHNIQUE: Multidetector CT imaging of the head and cervical spine was performed following the standard protocol without intravenous contrast. Multiplanar CT image reconstructions of the cervical spine were also generated. RADIATION DOSE REDUCTION: This exam was performed according to the departmental dose-optimization program which includes automated exposure control, adjustment of the mA and/or kV according to patient size and/or use of iterative reconstruction technique. COMPARISON:  None Available. FINDINGS: CT HEAD FINDINGS Brain: No hemorrhage. No hydrocephalus. No extra-axial fluid collection. No CT evidence of an acute cortical infarct. No mass  effect. No mass lesion. Vascular: No hyperdense vessel or unexpected calcification. Skull: Normal. Negative for fracture or focal lesion. Sinuses/Orbits: No middle ear or mastoid effusion. Paranasal sinuses notable for mild mucosal thickening in the left maxillary sinus. Orbits are unremarkable. Other: None. CT CERVICAL SPINE FINDINGS Alignment: Normal. Skull base and vertebrae: No acute fracture. No primary bone lesion or focal pathologic process. Soft tissues and spinal canal: No prevertebral fluid or swelling. No visible canal hematoma. Disc levels:  No CT evidence of high-grade spinal canal stenosis Upper chest: Negative. Other: None IMPRESSION: 1. No CT evidence of intracranial injury. 2. No acute fracture or traumatic subluxation of the cervical spine. Electronically Signed   By: Lyndall Gore M.D.   On: 09/16/2023 13:03   DG Chest Portable 1 View Result Date: 09/16/2023 CLINICAL DATA:  Fall. EXAM: PORTABLE CHEST 1 VIEW COMPARISON:  09/02/2023. FINDINGS: Bilateral lung fields are clear. Bilateral costophrenic angles are clear. Normal cardio-mediastinal silhouette. No acute osseous abnormalities. The soft tissues are within normal limits. IMPRESSION: No active disease. Electronically Signed   By: Ree Molt M.D.   On: 09/16/2023 11:29   DG Pelvis Portable Result Date: 09/16/2023 CLINICAL DATA:  Fall. EXAM: PORTABLE PELVIS 1-2 VIEWS COMPARISON:  None Available. FINDINGS: Pelvis is intact with normal and symmetric sacroiliac joints. No acute fracture or dislocation. No aggressive osseous lesion. Visualized sacral arcuate lines are unremarkable. Unremarkable symphysis pubis. There are mild degenerative changes of bilateral hip joints without significant joint space narrowing. Osteophytosis of the superior acetabulum. No radiopaque foreign bodies. IMPRESSION: *No acute osseous abnormality of the pelvis. Electronically Signed   By: Ree Molt M.D.   On: 09/16/2023 11:26    Procedures Procedures     Medications Ordered in ED Medications  ipratropium-albuterol  (DUONEB) 0.5-2.5 (3) MG/3ML nebulizer solution 3 mL (has no administration in time range)  sodium chloride  0.9 % bolus 500 mL (500 mLs Intravenous Bolus 09/16/23 1054)  potassium chloride  SA (KLOR-CON  M) CR tablet 20 mEq (20 mEq Oral Given 09/16/23 1140)  iohexol  (OMNIPAQUE ) 300 MG/ML solution 100 mL (100 mLs Intravenous Contrast Given 09/16/23 1107)  iohexol  (OMNIPAQUE ) 350 MG/ML injection 100 mL (100 mLs Intravenous Contrast Given 09/16/23 1119)    ED Course/ Medical Decision Making/ A&P Clinical Course as of 09/16/23 1409  Fri Sep 16, 2023  [id number] 73 year old male history of dementia brought in by EMS from home after he slid out of a chair today.  Wife states he is been more confused recently.  Getting labs and imaging.  Disposition per results of testing. [MB]    Clinical Course User Index [MB] Towana Ozell BROCKS, MD  Medical Decision Making  This patient presents to the ED after a fall, this involves an extensive number of treatment options, and is a complaint that carries with it a high risk of complications and morbidity.  The differential diagnosis includes  intracranial hemorrhage, subdural/epidural hematoma, vertebral fracture, spinal cord injury, muscle strain, skull fracture, fracture.   Co morbidities that complicate the patient evaluation  CHF, COPD, dementia, diabetes, HTN, right leg weakness/unsteady gait   Additional history obtained:  Dr. Shona PCP 08/2023 ECHO: 60-65% EF   Problem List / ED Course / Critical interventions / Medication management   Patient presents to ED concerned for AMS. Wife stating that patient has been getting severely confused and agitated over the past 3 days. Patient fell from his chair today and hit his head. Patient with many falls recently.  Physical exam with diffuse generalized rhonchi on both lungs. Patient is also hypotensive despite IV fluids.  O2 mildly hypoxic at 92%. Rest of physical exam reassuring. I Ordered, and personally interpreted labs.  CBC with leukocytosis of 12.9.  There is also mild anemia with hemoglobin at 10.2.  CMP with mild elevation of creatinine at 1.34.  UA without concern for infection.  Respiratory panel negative.  Lactic acid initially 3.0 and decreased to 1.2 after fluid bolus.  Troponin initially 23 downtrending to 22.  BNP within normal is.  Occult blood positive -patient with recent colonoscopy showing internal and external hemorrhoids which appears to be where the blood is coming from today. The patient was maintained on a cardiac monitor.  I personally viewed and interpreted the cardiac monitored which showed an underlying rhythm of: no acute change from baseline. I ordered imaging studies including CT abdomen/pelvis, CTA chest, CT head/cervical spine, chest x-ray, pelvis x-ray to assess for complications given patient's falls. I independently visualized and interpreted imaging.  CTA chest showing bronchitis.  Rest of imaging unremarkable.  I agree with the radiologist interpretation Dr. Vicci admitting provider. I have reviewed the patients home medicines and have made adjustments as needed   Social Determinants of Health:  dementia           Final Clinical Impression(s) / ED Diagnoses Final diagnoses:  Altered mental status, unspecified altered mental status type    Rx / DC Orders ED Discharge Orders     None         Hoy Nidia FALCON, [place] 09/16/23 1409    Towana Ozell BROCKS, MD 09/16/23 1755

## 2023-09-16 NOTE — Hospital Course (Signed)
 73 year old male with advanced dementia associated with history of alcohol  abuse and Korsakoff's encephalopathy, hypertension, COPD, former longtime smoker, iron  deficiency anemia, type 2 diabetes mellitus, GAD, GERD, BPH with urinary retention, failure to thrive, frequent falls has had several recent hospitalizations most recently discharged on 09/03/2023 after being hospitalized for urinary retention, acute on chronic respiratory failure and metabolic encephalopathy and he was discharged home with home health services with his wife as primary caretaker.  She reports that for the past several days he has had increasing confusion and wandering at night and falling multiple times at home.  He had canceled his outpatient appointment to have a colonoscopy on 09/28/2023.  This was part of a workup for guaiac positive stools and recent colonoscopy was incomplete due to poor prep.  Wife reports that it has been unsafe for him to remain at home because she is unable to care for him due to his frequent falls and nighttime wandering behavior.  She reports that he fell out of a chair this morning and struck his head on the floor.  He was discharged with a Foley catheter but has since been removed and he has been voiding per wife.  She reports that she is unable to care for him at home in his current state.  His ED trauma workup has been negative for acute findings.  He is being admitted for frequent falls and encephalopathy.

## 2023-09-16 NOTE — Plan of Care (Signed)

## 2023-09-16 NOTE — Progress Notes (Signed)
 Started on 2 liters for low saturation.

## 2023-09-16 NOTE — H&P (Signed)
 History and Physical  [facility]  [name] FMW:[id number] DOB: 1950-12-12 DOA: 09/16/2023  PCP: [name], MD  Patient coming from: HOME by [facility] Level of care: Telemetry  I have personally briefly reviewed patient's old medical records in [facility]  Chief Complaint: Fall   HPI: Jeff Miles is a 73 year old male with advanced dementia associated with history of alcohol  abuse and Korsakoff's encephalopathy, hypertension, COPD, former longtime smoker, iron  deficiency anemia, type 2 diabetes mellitus, GAD, GERD, BPH with urinary retention, failure to thrive, frequent falls has had several recent hospitalizations most recently discharged on 09/03/2023 after being hospitalized for urinary retention, acute on chronic respiratory failure and metabolic encephalopathy and he was discharged home with home health services with his wife as primary caretaker.  She reports that for the past several days he has had increasing confusion and wandering at night and falling multiple times at home.  He had canceled his outpatient appointment to have a colonoscopy on 09/28/2023.  This was part of a workup for guaiac positive stools and recent colonoscopy was incomplete due to poor prep.  Wife reports that it has been unsafe for him to remain at home because she is unable to care for him due to his frequent falls and nighttime wandering behavior.  She reports that he fell out of a chair this morning and struck his head on the floor.  He was discharged with a Foley catheter but has since been removed and he has been voiding per wife.  She reports that she is unable to care for him at home in his current state.  His ED trauma workup has been negative for acute findings.  He is being admitted for frequent falls and encephalopathy.    Past Medical History:  Diagnosis Date   CHF (congestive heart failure) (HCC)    COPD (chronic obstructive pulmonary disease) (HCC)    Dementia (HCC)     alcohol  related   Diabetes (HCC)    Hard of hearing    History of kidney stones    Hypertension    Right leg weakness    Unsteady gait    Weakness of both legs    Wernicke's encephalopathy     Past Surgical History:  Procedure Laterality Date   BACK SURGERY     two   BIOPSY  06/28/2023   Procedure: BIOPSY;  Surgeon: Cinderella Deatrice FALCON, MD;  [facility];  Service: Endoscopy;;   COLONOSCOPY WITH PROPOFOL  N/A 11/28/2018   Procedure: COLONOSCOPY WITH PROPOFOL ;  Surgeon: Harvey Margo LITTIE, MD;  [facility];  Service: Endoscopy;  Laterality: N/A;  7:30am   COLONOSCOPY WITH PROPOFOL  N/A 06/28/2023   Procedure: COLONOSCOPY WITH PROPOFOL ;  Surgeon: Cinderella Deatrice FALCON, MD;  [facility];  Service: Endoscopy;  Laterality: N/A;   ESOPHAGOGASTRODUODENOSCOPY (EGD) WITH PROPOFOL  N/A 06/28/2023   Procedure: ESOPHAGOGASTRODUODENOSCOPY (EGD) WITH PROPOFOL ;  Surgeon: Cinderella Deatrice FALCON, MD;  [facility];  Service: Endoscopy;  Laterality: N/A;   INNER EAR SURGERY     six surgery   MASS EXCISION N/A 10/08/2019   Procedure: EXCISION MASS ON TONGUE;  Surgeon: Karis Clunes, MD;  Location: [facility];  Service: ENT;  Laterality: N/A;   NOSE SURGERY     x2   POLYPECTOMY  11/28/2018   Procedure: POLYPECTOMY;  Surgeon: Harvey Margo LITTIE, MD;  [facility];  Service: Endoscopy;;  cecal polyp, ascending polyps x4, polyp at hepatic flexure,  descending colon polyps x4, rectal polyps x4     reports that he quit smoking about 7 years ago. His smoking use included cigarettes. He started smoking about 41 years ago. He has a 68 pack-year smoking history. He has never used smokeless tobacco. He reports that he does not currently use alcohol . He reports that he does not use drugs.  No Known Allergies  Family History  Problem Relation Age of Onset   Colon cancer Neg Hx    Liver disease Neg Hx     Prior to Admission medications   Medication Sig Start  Date End Date Taking? Authorizing Provider  albuterol  (PROVENTIL ) (2.5 MG/3ML) 0.083% nebulizer solution Take 2.5 mg by nebulization 3 (three) times daily.   Yes [provider]  Albuterol  Sulfate (PROAIR  HFA IN) Inhale 2 puffs into the lungs daily.   Yes [provider]  Artificial Tear Solution (SOOTHE XP OP) Apply 1 drop to eye daily as needed (dry eyes).   Yes [provider]  atorvastatin  (LIPITOR) 80 MG tablet Take 80 mg by mouth daily.   Yes [provider]  Fluticasone -Umeclidin-Vilant (TRELEGY ELLIPTA) 100-62.5-25 MCG/INH AEPB Inhale 1 puff into the lungs daily.   Yes Lindner, Jodi N, RN  furosemide  (LASIX ) 40 MG tablet Take 1 tablet (40 mg total) by mouth daily. 11/11/15  Yes Ricky Fines, MD  glipiZIDE  (GLUCOTROL  XL) 5 MG 24 hr tablet Take 5 mg by mouth 2 (two) times daily.   Yes [provider]  Iron -FA-B Cmp-C-Biot-Probiotic (FUSION PLUS) CAPS Take 1 capsule by mouth daily. 04/26/23  Yes [provider]  LORazepam  (ATIVAN ) 1 MG tablet Take 1 mg by mouth daily.    Yes [provider]  metFORMIN (GLUCOPHAGE) 1000 MG tablet Take 1,000 mg by mouth 2 (two) times daily. 06/20/23  Yes [provider]  metoprolol  tartrate (LOPRESSOR ) 50 MG tablet Take 1 tablet (50 mg total) by mouth 2 (two) times daily. 09/03/23  Yes Tat, Alm, MD  mirtazapine  (REMERON ) 15 MG tablet Take 15 mg by mouth at bedtime.   Yes [provider]  Multiple Vitamin (MULTIVITAMIN WITH MINERALS) TABS tablet Take 1 tablet by mouth daily. 11/11/15  Yes Ricky Fines, MD  naltrexone  (DEPADE) 50 MG tablet Take 50 mg by mouth daily.   Yes [provider]  Omega-3 Fatty Acids (FISH OIL) 1000 MG CAPS Take 1,000 mg by mouth daily.    Yes [provider]  omeprazole (PRILOSEC) 20 MG capsule Take 20 mg by mouth daily. 08/27/23  Yes [provider]  pioglitazone (ACTOS) 30 MG tablet Take 30 mg by mouth daily.   Yes [provider]  risperiDONE  (RISPERDAL ) 1 MG tablet Take 1 tablet (1 mg total) by mouth at bedtime. 09/03/23  Yes Tat, Alm, MD  tamsulosin  (FLOMAX ) 0.4 MG CAPS capsule Take 1 capsule (0.4 mg total) by mouth daily. 09/04/23  Yes Tat, Alm, MD  Thiamine  HCl (VITAMIN B-1) 250 MG tablet Take 250 mg by mouth daily.   Yes [provider]  vitamin B-12 1000 MCG tablet Take 1 tablet (1,000 mcg total) by mouth daily. 11/11/15  Yes Ricky Fines, MD    Physical Exam: Vitals:   09/16/23 1100 09/16/23 1143 09/16/23 1315 09/16/23 1400  BP: 95/62 120/69 (!) 98/59 (!) 93/55  Pulse: 83 93 72 70  Resp: (!) 25 (!) 28 19 18   Temp:      TempSrc:      SpO2: 92% 95% 92% 91%  Weight:  Height:        Constitutional: NAD, calm, comfortable, pt is confused with advanced dementia. Hard of hearing.  Eyes: PERRL, lids and conjunctivae normal ENMT: Mucous membranes are moist. Posterior pharynx clear of any exudate or lesions.  Neck: normal, supple, no masses, no thyromegaly Respiratory: clear to auscultation bilaterally, no wheezing, no crackles. Normal respiratory effort. No accessory muscle use.  Cardiovascular: normal s1, s2 sounds, no murmurs / rubs / gallops. No extremity edema. 2+ pedal pulses. No carotid bruits.  Abdomen: no tenderness, no masses palpated. No hepatosplenomegaly. Bowel sounds positive.  Musculoskeletal: no clubbing / cyanosis. No joint deformity upper and lower extremities. Good ROM, no contractures. Normal muscle tone.  Skin: no rashes, lesions, ulcers. No induration Neurologic: CN 2-12 grossly intact. Sensation intact, DTR normal. Strength 5/5 in all 4.  Psychiatric: Poor judgment and insight. Alert and very confused and disoriented. Hard of hearing. Normal mood.   Labs on Admission: I have personally reviewed following labs and imaging studies  CBC: Recent Labs  Lab 09/16/23 1020  WBC 12.9*  NEUTROABS 11.1*  HGB 10.2*  HCT 34.6*  MCV 89.4  PLT 426*   Basic  Metabolic Panel: Recent Labs  Lab 09/16/23 1020  NA 141  K 3.3*  CL 104  CO2 26  GLUCOSE 106*  BUN 17  CREATININE 1.34*  CALCIUM  9.5   GFR: Estimated Creatinine Clearance: 57.8 mL/min (A) (by C-G formula based on SCr of 1.34 mg/dL (H)). Liver Function Tests: Recent Labs  Lab 09/16/23 1020  AST 34  ALT 26  ALKPHOS 69  BILITOT 0.8  PROT 6.9  ALBUMIN 3.3*   No results for input(s): LIPASE, AMYLASE in the last 168 hours. No results for input(s): AMMONIA in the last 168 hours. Coagulation Profile: No results for input(s): INR, PROTIME in the last 168 hours. Cardiac Enzymes: No results for input(s): CKTOTAL, CKMB, CKMBINDEX, TROPONINI in the last 168 hours. BNP (last 3 results) No results for input(s): PROBNP in the last 8760 hours. HbA1C: No results for input(s): HGBA1C in the last 72 hours. CBG: No results for input(s): GLUCAP in the last 168 hours. Lipid Profile: No results for input(s): CHOL, HDL, LDLCALC, TRIG, CHOLHDL, LDLDIRECT in the last 72 hours. Thyroid Function Tests: No results for input(s): TSH, T4TOTAL, FREET4, T3FREE, THYROIDAB in the last 72 hours. Anemia Panel: No results for input(s): VITAMINB12, FOLATE, FERRITIN, TIBC, IRON , RETICCTPCT in the last 72 hours. Urine analysis:    Component Value Date/Time   COLORURINE YELLOW 09/16/2023 1158   APPEARANCEUR HAZY (A) 09/16/2023 1158   LABSPEC 1.017 09/16/2023 1158   PHURINE 5.0 09/16/2023 1158   GLUCOSEU NEGATIVE 09/16/2023 1158   HGBUR SMALL (A) 09/16/2023 1158   BILIRUBINUR NEGATIVE 09/16/2023 1158   KETONESUR NEGATIVE 09/16/2023 1158   PROTEINUR >=300 (A) 09/16/2023 1158   NITRITE NEGATIVE 09/16/2023 1158   LEUKOCYTESUR NEGATIVE 09/16/2023 1158    Radiological Exams on Admission: CT ABDOMEN PELVIS W CONTRAST Result Date: 09/16/2023 CLINICAL DATA:  Status post fall with head strike and altered mental status. EXAM: CT ANGIOGRAPHY CHEST CT  ABDOMEN AND PELVIS WITH CONTRAST TECHNIQUE: Multidetector CT imaging of the chest was performed using the standard protocol during bolus administration of intravenous contrast. Multiplanar CT image reconstructions and MIPs were obtained to evaluate the vascular anatomy. Multidetector CT imaging of the abdomen and pelvis was performed using the standard protocol during bolus administration of intravenous contrast. RADIATION DOSE REDUCTION: This exam was performed according to the departmental dose-optimization program which includes  automated exposure control, adjustment of the mA and/or kV according to patient size and/or use of iterative reconstruction technique. CONTRAST:  OMNIPAQUE  IOHEXOL  350 MG/ML SOLN COMPARISON:  Same day chest radiograph FINDINGS: CTA CHEST FINDINGS Cardiovascular: The study is acceptable for the evaluation of pulmonary embolism, although there is motion artifact. There are no filling defects in the central, lobar, segmental or subsegmental pulmonary artery branches to suggest acute pulmonary embolism. Great vessels are normal in course and caliber. Normal heart size. No significant pericardial fluid/thickening. Coronary artery calcifications. Mediastinum/Nodes: Imaged thyroid gland without nodules meeting criteria for imaging follow-up by size. Normal esophagus. No pathologically enlarged axillary, supraclavicular, mediastinal, or hilar lymph nodes. Lungs/Pleura: The central airways are patent. Mild-to-moderate upper lobe predominant centrilobular emphysema. Moderate diffuse bronchial wall thickening with right lower lobe subsegmental mucous plugging. Bilateral lower lobe subsegmental atelectasis. Irregular medial left upper lobe nodule measures 1.3 x 0.7 cm (6:39). Right apical 3 mm nodule (6:39). No pneumothorax. No pleural effusion. Musculoskeletal: No acute or abnormal lytic or blastic osseous lesions. Multilevel degenerative changes of the thoracic spine. Review of the MIP images  confirms the above findings. CT ABDOMEN and PELVIS FINDINGS Hepatobiliary: No focal hepatic lesions. No intra or extrahepatic biliary ductal dilation. Normal gallbladder. Pancreas: No focal lesions or main ductal dilation. Spleen: Normal in size without focal abnormality. Adrenals/Urinary Tract: 1.4 cm left adrenal nodule measures 57 HU. No right adrenal nodule. No suspicious renal mass, calculi, or hydronephrosis. Distended urinary bladder. Punctate focus of intraluminal gas. Stomach/Bowel: Normal appearance of the stomach. No evidence of bowel wall thickening, distention, or inflammatory changes. Colonic diverticulosis without acute diverticulitis. Normal appendix. Vascular/Lymphatic: Aortic atherosclerosis. No enlarged abdominal or pelvic lymph nodes. Reproductive: Heterogeneous enhancement of the prostate gland. Rounded hypoattenuating focus within the right hemi gland measures 1.5 cm (2:79), possibly a BPH nodule. Other: No free fluid, fluid collection, or free air. Musculoskeletal: No acute or abnormal lytic or blastic osseous lesions. Multilevel degenerative changes of the lumbar spine. IMPRESSION: CTA CHEST: 1. No evidence of pulmonary embolism. 2. Irregular medial left upper lobe nodule measures 1.3 x 0.7 cm. Consider one of the following in 3 months for both low-risk and high-risk individuals: (a) repeat chest CT, (b) follow-up PET-CT, or (c) tissue sampling. This recommendation follows the consensus statement: Guidelines for Management of Incidental Pulmonary Nodules Detected on CT Images: From the Fleischner Society 2017; Radiology 2017; 284:228-243. 3. Moderate diffuse bronchial wall thickening with right lower lobe subsegmental mucous plugging, as can be seen in the setting of bronchitis. CT ABDOMEN AND PELVIS: 1. No acute abnormality in the abdomen or pelvis. 2. Distended urinary bladder with punctate focus of intraluminal gas, which may be related to recent instrumentation. Correlate with urinalysis  to exclude cystitis. 3. Heterogeneous enhancement of the prostate gland. Rounded hypoattenuating focus within the right hemi gland measures 1.5 cm, possibly a BPH nodule. Recommend correlation with PSA. 4. Indeterminate 1.4 cm left adrenal nodule. Recommend further evaluation with adrenal protocol CT or MRI. 5. Aortic Atherosclerosis (ICD10-I70.0) and Emphysema (ICD10-J43.9). Coronary artery calcifications. Assessment for potential risk factor modification, dietary therapy or pharmacologic therapy may be warranted, if clinically indicated. Electronically Signed   By: Limin  Xu M.D.   On: 09/16/2023 13:17   CT Angio Chest PE W/Cm &/Or Wo Cm Result Date: 09/16/2023 CLINICAL DATA:  Status post fall with head strike and altered mental status. EXAM: CT ANGIOGRAPHY CHEST CT ABDOMEN AND PELVIS WITH CONTRAST TECHNIQUE: Multidetector CT imaging of the chest was performed using the  standard protocol during bolus administration of intravenous contrast. Multiplanar CT image reconstructions and MIPs were obtained to evaluate the vascular anatomy. Multidetector CT imaging of the abdomen and pelvis was performed using the standard protocol during bolus administration of intravenous contrast. RADIATION DOSE REDUCTION: This exam was performed according to the departmental dose-optimization program which includes automated exposure control, adjustment of the mA and/or kV according to patient size and/or use of iterative reconstruction technique. CONTRAST:  OMNIPAQUE  IOHEXOL  350 MG/ML SOLN COMPARISON:  Same day chest radiograph FINDINGS: CTA CHEST FINDINGS Cardiovascular: The study is acceptable for the evaluation of pulmonary embolism, although there is motion artifact. There are no filling defects in the central, lobar, segmental or subsegmental pulmonary artery branches to suggest acute pulmonary embolism. Great vessels are normal in course and caliber. Normal heart size. No significant pericardial fluid/thickening.  Coronary artery calcifications. Mediastinum/Nodes: Imaged thyroid gland without nodules meeting criteria for imaging follow-up by size. Normal esophagus. No pathologically enlarged axillary, supraclavicular, mediastinal, or hilar lymph nodes. Lungs/Pleura: The central airways are patent. Mild-to-moderate upper lobe predominant centrilobular emphysema. Moderate diffuse bronchial wall thickening with right lower lobe subsegmental mucous plugging. Bilateral lower lobe subsegmental atelectasis. Irregular medial left upper lobe nodule measures 1.3 x 0.7 cm (6:39). Right apical 3 mm nodule (6:39). No pneumothorax. No pleural effusion. Musculoskeletal: No acute or abnormal lytic or blastic osseous lesions. Multilevel degenerative changes of the thoracic spine. Review of the MIP images confirms the above findings. CT ABDOMEN and PELVIS FINDINGS Hepatobiliary: No focal hepatic lesions. No intra or extrahepatic biliary ductal dilation. Normal gallbladder. Pancreas: No focal lesions or main ductal dilation. Spleen: Normal in size without focal abnormality. Adrenals/Urinary Tract: 1.4 cm left adrenal nodule measures 57 HU. No right adrenal nodule. No suspicious renal mass, calculi, or hydronephrosis. Distended urinary bladder. Punctate focus of intraluminal gas. Stomach/Bowel: Normal appearance of the stomach. No evidence of bowel wall thickening, distention, or inflammatory changes. Colonic diverticulosis without acute diverticulitis. Normal appendix. Vascular/Lymphatic: Aortic atherosclerosis. No enlarged abdominal or pelvic lymph nodes. Reproductive: Heterogeneous enhancement of the prostate gland. Rounded hypoattenuating focus within the right hemi gland measures 1.5 cm (2:79), possibly a BPH nodule. Other: No free fluid, fluid collection, or free air. Musculoskeletal: No acute or abnormal lytic or blastic osseous lesions. Multilevel degenerative changes of the lumbar spine. IMPRESSION: CTA CHEST: 1. No evidence of  pulmonary embolism. 2. Irregular medial left upper lobe nodule measures 1.3 x 0.7 cm. Consider one of the following in 3 months for both low-risk and high-risk individuals: (a) repeat chest CT, (b) follow-up PET-CT, or (c) tissue sampling. This recommendation follows the consensus statement: Guidelines for Management of Incidental Pulmonary Nodules Detected on CT Images: From the Fleischner Society 2017; Radiology 2017; 284:228-243. 3. Moderate diffuse bronchial wall thickening with right lower lobe subsegmental mucous plugging, as can be seen in the setting of bronchitis. CT ABDOMEN AND PELVIS: 1. No acute abnormality in the abdomen or pelvis. 2. Distended urinary bladder with punctate focus of intraluminal gas, which may be related to recent instrumentation. Correlate with urinalysis to exclude cystitis. 3. Heterogeneous enhancement of the prostate gland. Rounded hypoattenuating focus within the right hemi gland measures 1.5 cm, possibly a BPH nodule. Recommend correlation with PSA. 4. Indeterminate 1.4 cm left adrenal nodule. Recommend further evaluation with adrenal protocol CT or MRI. 5. Aortic Atherosclerosis (ICD10-I70.0) and Emphysema (ICD10-J43.9). Coronary artery calcifications. Assessment for potential risk factor modification, dietary therapy or pharmacologic therapy may be warranted, if clinically indicated. Electronically Signed   By:  Limin  Xu M.D.   On: 09/16/2023 13:17   CT Head Wo Contrast Result Date: 09/16/2023 CLINICAL DATA:  Head trauma, minor (Age >= 65y); Neck trauma (Age >= 65y) EXAM: CT HEAD WITHOUT CONTRAST CT CERVICAL SPINE WITHOUT CONTRAST TECHNIQUE: Multidetector CT imaging of the head and cervical spine was performed following the standard protocol without intravenous contrast. Multiplanar CT image reconstructions of the cervical spine were also generated. RADIATION DOSE REDUCTION: This exam was performed according to the departmental dose-optimization program which includes  automated exposure control, adjustment of the mA and/or kV according to patient size and/or use of iterative reconstruction technique. COMPARISON:  None Available. FINDINGS: CT HEAD FINDINGS Brain: No hemorrhage. No hydrocephalus. No extra-axial fluid collection. No CT evidence of an acute cortical infarct. No mass effect. No mass lesion. Vascular: No hyperdense vessel or unexpected calcification. Skull: Normal. Negative for fracture or focal lesion. Sinuses/Orbits: No middle ear or mastoid effusion. Paranasal sinuses notable for mild mucosal thickening in the left maxillary sinus. Orbits are unremarkable. Other: None. CT CERVICAL SPINE FINDINGS Alignment: Normal. Skull base and vertebrae: No acute fracture. No primary bone lesion or focal pathologic process. Soft tissues and spinal canal: No prevertebral fluid or swelling. No visible canal hematoma. Disc levels:  No CT evidence of high-grade spinal canal stenosis Upper chest: Negative. Other: None IMPRESSION: 1. No CT evidence of intracranial injury. 2. No acute fracture or traumatic subluxation of the cervical spine. Electronically Signed   By: Lyndall Gore M.D.   On: 09/16/2023 13:03   CT Cervical Spine Wo Contrast Result Date: 09/16/2023 CLINICAL DATA:  Head trauma, minor (Age >= 65y); Neck trauma (Age >= 65y) EXAM: CT HEAD WITHOUT CONTRAST CT CERVICAL SPINE WITHOUT CONTRAST TECHNIQUE: Multidetector CT imaging of the head and cervical spine was performed following the standard protocol without intravenous contrast. Multiplanar CT image reconstructions of the cervical spine were also generated. RADIATION DOSE REDUCTION: This exam was performed according to the departmental dose-optimization program which includes automated exposure control, adjustment of the mA and/or kV according to patient size and/or use of iterative reconstruction technique. COMPARISON:  None Available. FINDINGS: CT HEAD FINDINGS Brain: No hemorrhage. No hydrocephalus. No extra-axial fluid  collection. No CT evidence of an acute cortical infarct. No mass effect. No mass lesion. Vascular: No hyperdense vessel or unexpected calcification. Skull: Normal. Negative for fracture or focal lesion. Sinuses/Orbits: No middle ear or mastoid effusion. Paranasal sinuses notable for mild mucosal thickening in the left maxillary sinus. Orbits are unremarkable. Other: None. CT CERVICAL SPINE FINDINGS Alignment: Normal. Skull base and vertebrae: No acute fracture. No primary bone lesion or focal pathologic process. Soft tissues and spinal canal: No prevertebral fluid or swelling. No visible canal hematoma. Disc levels:  No CT evidence of high-grade spinal canal stenosis Upper chest: Negative. Other: None IMPRESSION: 1. No CT evidence of intracranial injury. 2. No acute fracture or traumatic subluxation of the cervical spine. Electronically Signed   By: Lyndall Gore M.D.   On: 09/16/2023 13:03   DG Chest Portable 1 View Result Date: 09/16/2023 CLINICAL DATA:  Fall. EXAM: PORTABLE CHEST 1 VIEW COMPARISON:  09/02/2023. FINDINGS: Bilateral lung fields are clear. Bilateral costophrenic angles are clear. Normal cardio-mediastinal silhouette. No acute osseous abnormalities. The soft tissues are within normal limits. IMPRESSION: No active disease. Electronically Signed   By: Ree Molt M.D.   On: 09/16/2023 11:29   DG Pelvis Portable Result Date: 09/16/2023 CLINICAL DATA:  Fall. EXAM: PORTABLE PELVIS 1-2 VIEWS COMPARISON:  None Available.  FINDINGS: Pelvis is intact with normal and symmetric sacroiliac joints. No acute fracture or dislocation. No aggressive osseous lesion. Visualized sacral arcuate lines are unremarkable. Unremarkable symphysis pubis. There are mild degenerative changes of bilateral hip joints without significant joint space narrowing. Osteophytosis of the superior acetabulum. No radiopaque foreign bodies. IMPRESSION: *No acute osseous abnormality of the pelvis. Electronically Signed   By: Ree Molt M.D.   On: 09/16/2023 11:26   EKG: Independently reviewed.   Assessment/Plan Principal Problem:   Acute metabolic encephalopathy Active Problems:   Bronchospasm   COPD (chronic obstructive pulmonary disease) (HCC)   Restlessness and agitation   Rectal bleeding   Heme + stool   Iron  deficiency anemia, unspecified   Diabetes mellitus type 2 in nonobese (HCC)   GERD (gastroesophageal reflux disease)   Grade II hemorrhoids   AMS (altered mental status)   Weakness   Fall   Hypokalemia   Dementia with behavioral disturbance (HCC)   FTT (failure to thrive) in adult   Leukocytosis   Wernicke's encephalopathy   CHF (congestive heart failure) (HCC)   Hard of hearing   BPH (benign prostatic hyperplasia)   Unsteady gait   Weakness of both legs   Frequent falls   Acute metabolic encephalopathy -Likely underlying advanced dementia -No acute metabolic abnormalities found -Treated with IV fluids in the ED for mild dehydration -Monitor neurochecks -Continue high-dose thiamine  supplementation -Fall precautions  Frequent falls -Trauma workup in the ED negative for acute findings -Fall precautions recommended -PT evaluation requested -OT evaluation requested  Generalized weakness and adult failure to thrive -Family tried caring for him at home but it has become too difficult for the wife to manage alone -Wife did have home health support but this has not been enough -Patient will need placement in a memory care facility -Wife reports home situation unsafe for patient at this time -Given precipitous decline we will ask for palliative medicine consultation for goals of care  Urinary retention -Monitor closely for recurrence -Foley catheter has been removed -Monitor intake and output closely -Catheterize if unable to void -Resume home tamsulosin  daily  Hypotension -Reduced metoprolol  from 50 mg to 25 mg -Improved some with IV fluids -Added holding parameters to  metoprolol  orders  COPD with chronic bronchospasm -Resume home bronchodilators -Scheduled bronchodilators ordered via nebulizer  Rectal bleeding with guaiac positive stool -Likely secondary to hemorrhoids seen on colonoscopy -Has been unable to have a colonoscopy due to poor prep and family recently canceled the outpatient procedure that was scheduled for 09/28/2023.  Type 2 diabetes mellitus -SSI coverage ordered  Hypokalemia -Repleted  DVT prophylaxis: Enoxaparin  Code Status: Full Family Communication: Updated wife at bedside 09/16/2023 Disposition Plan: Anticipate placement in memory care Consults called: PT/OT Admission status: INP Level of care: Telemetry Afton Louder MD [facility] How to contact the [facility] Attending or Consulting provider 7A - 7P or covering provider during after hours 7P -7A, for this patient?  Check the care team in [facility] and look for a) attending/consulting [facility] provider listed and b) the [facility] team listed Log into [email or web address] and use [facility] universal password to access. If you do not have the password, please contact the hospital operator. Locate the [facility] provider you are looking for under [facility] and page to a number that you can be directly reached. If you still have difficulty reaching the provider, please page the [facility] (Director on Call) for the Hospitalists listed on amion for assistance.   If 7PM-7AM, please contact night-coverage  [email or web address] Password [email or web address]  09/16/2023, 2:24 PM

## 2023-09-16 NOTE — ED Triage Notes (Signed)
 [facility] reports that patient fell out of a chair this morning and struck his head on the floor. Pt wife reported AMS for the past 36-48 hours. At baseline patient is altered due to dementia, but there is an acute change. V/S stable reported by EMS.

## 2023-09-17 DIAGNOSIS — K219 Gastro-esophageal reflux disease without esophagitis: Secondary | ICD-10-CM

## 2023-09-17 DIAGNOSIS — E512 Wernicke's encephalopathy: Secondary | ICD-10-CM

## 2023-09-17 LAB — CBC WITH DIFFERENTIAL/PLATELET
Abs Immature Granulocytes: 0.07 10*3/uL (ref 0.00–0.07)
Basophils Absolute: 0 10*3/uL (ref 0.0–0.1)
Basophils Relative: 0 %
Eosinophils Absolute: 0.1 10*3/uL (ref 0.0–0.5)
Eosinophils Relative: 1 %
HCT: 30.8 % — ABNORMAL LOW (ref 39.0–52.0)
Hemoglobin: 9 g/dL — ABNORMAL LOW (ref 13.0–17.0)
Immature Granulocytes: 1 %
Lymphocytes Relative: 12 %
Lymphs Abs: 1.2 10*3/uL (ref 0.7–4.0)
MCH: 26.3 pg (ref 26.0–34.0)
MCHC: 29.2 g/dL — ABNORMAL LOW (ref 30.0–36.0)
MCV: 90.1 fL (ref 80.0–100.0)
Monocytes Absolute: 0.8 10*3/uL (ref 0.1–1.0)
Monocytes Relative: 8 %
Neutro Abs: 7.6 10*3/uL (ref 1.7–7.7)
Neutrophils Relative %: 78 %
Platelets: 346 10*3/uL (ref 150–400)
RBC: 3.42 MIL/uL — ABNORMAL LOW (ref 4.22–5.81)
RDW: 15 % (ref 11.5–15.5)
WBC: 9.8 10*3/uL (ref 4.0–10.5)
nRBC: 0 % (ref 0.0–0.2)

## 2023-09-17 LAB — BASIC METABOLIC PANEL
Anion gap: 11 (ref 5–15)
BUN: 14 mg/dL (ref 8–23)
CO2: 26 mmol/L (ref 22–32)
Calcium: 9.1 mg/dL (ref 8.9–10.3)
Chloride: 107 mmol/L (ref 98–111)
Creatinine, Ser: 1 mg/dL (ref 0.61–1.24)
GFR, Estimated: >60 mL/min (ref >60)
Glucose, Bld: 124 mg/dL — ABNORMAL HIGH (ref 70–99)
Potassium: 3.4 mmol/L — ABNORMAL LOW (ref 3.5–5.1)
Sodium: 144 mmol/L (ref 135–145)

## 2023-09-17 LAB — GLUCOSE, CAPILLARY
Glucose-Capillary: 120 mg/dL — ABNORMAL HIGH (ref 70–99)
Glucose-Capillary: 139 mg/dL — ABNORMAL HIGH (ref 70–99)
Glucose-Capillary: 155 mg/dL — ABNORMAL HIGH (ref 70–99)
Glucose-Capillary: 127 mg/dL — ABNORMAL HIGH (ref 70–99)
Glucose-Capillary: 197 mg/dL — ABNORMAL HIGH (ref 70–99)

## 2023-09-17 LAB — MAGNESIUM: Magnesium: 1.3 mg/dL — ABNORMAL LOW (ref 1.7–2.4)

## 2023-09-17 MED ORDER — POTASSIUM CHLORIDE CRYS ER 20 MEQ PO TBCR
20.0000 meq | EXTENDED_RELEASE_TABLET | Freq: Two times a day (BID) | ORAL | Status: AC
  Administered 2023-09-17 (×2): 20 meq via ORAL
  Filled 2023-09-17 (×2): qty 1

## 2023-09-17 MED ORDER — MAGNESIUM SULFATE 4 GM/100ML IV SOLN
4.0000 g | Freq: Once | INTRAVENOUS | Status: AC
  Administered 2023-09-17: 4 g via INTRAVENOUS
  Filled 2023-09-17: qty 100

## 2023-09-17 NOTE — Plan of Care (Signed)

## 2023-09-17 NOTE — Plan of Care (Signed)
  Problem: Education: Goal: Knowledge of General Education information will improve Description: Including pain rating scale, medication(s)/side effects and non-pharmacologic comfort measures Outcome: Progressing   Problem: Health Behavior/Discharge Planning: Goal: Ability to manage health-related needs will improve Outcome: Progressing   Problem: Clinical Measurements: Goal: Ability to maintain clinical measurements within normal limits will improve Outcome: Progressing   Problem: Activity: Goal: Risk for activity intolerance will decrease Outcome: Progressing   Problem: Nutrition: Goal: Adequate nutrition will be maintained Outcome: Progressing   Problem: Coping: Goal: Level of anxiety will decrease Outcome: Progressing   Problem: Elimination: Goal: Will not experience complications related to bowel motility Outcome: Progressing   Problem: Pain Management: Goal: General experience of comfort will improve Outcome: Progressing   Problem: Skin Integrity: Goal: Risk for impaired skin integrity will decrease Outcome: Progressing   Problem: Coping: Goal: Ability to adjust to condition or change in health will improve Outcome: Progressing   Problem: Nutritional: Goal: Maintenance of adequate nutrition will improve Outcome: Progressing   Problem: Skin Integrity: Goal: Risk for impaired skin integrity will decrease Outcome: Progressing   Problem: Tissue Perfusion: Goal: Adequacy of tissue perfusion will improve Outcome: Progressing

## 2023-09-17 NOTE — Progress Notes (Signed)
 PROGRESS NOTE   Jeff Miles  FMW:[id number] DOB: Dec 25, 1950 DOA: 09/16/2023 PCP: [name], MD   Chief Complaint  Patient presents with   Altered Mental Status   Level of care: Telemetry  Brief Admission History:  73 year old male with advanced dementia associated with history of alcohol  abuse and Korsakoff's encephalopathy, hypertension, COPD, former longtime smoker, iron  deficiency anemia, type 2 diabetes mellitus, GAD, GERD, BPH with urinary retention, failure to thrive, frequent falls has had several recent hospitalizations most recently discharged on 09/03/2023 after being hospitalized for urinary retention, acute on chronic respiratory failure and metabolic encephalopathy and he was discharged home with home health services with his wife as primary caretaker.  She reports that for the past several days he has had increasing confusion and wandering at night and falling multiple times at home.  He had canceled his outpatient appointment to have a colonoscopy on 09/28/2023.  This was part of a workup for guaiac positive stools and recent colonoscopy was incomplete due to poor prep.  Wife reports that it has been unsafe for him to remain at home because she is unable to care for him due to his frequent falls and nighttime wandering behavior.  She reports that he fell out of a chair this morning and struck his head on the floor.  He was discharged with a Foley catheter but has since been removed and he has been voiding per wife.  She reports that she is unable to care for him at home in his current state.  His ED trauma workup has been negative for acute findings.  He is being admitted for frequent falls and encephalopathy.   Assessment and Plan:  Acute metabolic encephalopathy -Likely underlying advanced dementia -No acute metabolic abnormalities found -Treated with IV fluids in the ED for mild dehydration -Monitor neurochecks for now -Continue high-dose thiamine   supplementation -Fall precautions   Frequent falls -Trauma workup in the ED negative for acute findings -Fall precautions recommended -PT evaluation requested -OT evaluation requested   Generalized weakness and adult failure to thrive -Family tried caring for him at home but it has become too difficult for the wife to manage alone -Wife did have home health support but this has not been enough -Patient will need placement in a memory care facility -Wife reports home situation unsafe for patient at this time -Given precipitous decline we will ask for palliative medicine consultation for goals of care   Urinary retention -Monitor closely for recurrence -Foley catheter has been removed -Monitor intake and output closely -Catheterize if unable to void -Resumed home tamsulosin  daily   Hypotension -Reduced metoprolol  from 50 mg to 25 mg -Improved some with IV fluids -Added holding parameters to metoprolol  orders   COPD with chronic bronchospasm -Resume home bronchodilators -Scheduled bronchodilators ordered via nebulizer   Rectal bleeding with guaiac positive stool -Likely secondary to hemorrhoids seen on colonoscopy -Has been unable to have a colonoscopy due to poor prep and family recently canceled the outpatient procedure that was scheduled for 09/28/2023.   Type 2 diabetes mellitus -SSI coverage ordered  CBG (last 3)  Recent Labs    09/17/23 0340 09/17/23 0747 09/17/23 1104  GLUCAP 120* 139* 155*    Hypokalemia -Repleted  DVT prophylaxis: enoxaparin  Code Status: Full  Family Communication: updated wife bedside 1/3 Disposition: anticipate SNF/memory care    Consultants:  PT/OT   Procedures:   Antimicrobials:    Subjective: Pt pleasantly confused, he has been eating breakfast, no specific complaints.  Objective: Vitals:   09/17/23 0734 09/17/23 0826 09/17/23 1306 09/17/23 1323  BP:  131/66 128/69   Pulse:  100 80   Resp:   (!) 22   Temp:   (!) 97.5  F (36.4 C)   TempSrc:   Oral   SpO2: 96%  94% 91%  Weight:      Height:        Intake/Output Summary (Last 24 hours) at 09/17/2023 1357 Last data filed at 09/17/2023 1306 Gross per 24 hour  Intake 960 ml  Output 875 ml  Net 85 ml   Filed Weights   09/16/23 0949 09/17/23 0252  Weight: 98.9 kg 98.1 kg   Examination:  General exam: Appears calm and comfortable  Respiratory system: Clear to auscultation. Respiratory effort normal. Cardiovascular system: normal S1 & S2 heard. No JVD, murmurs, rubs, gallops or clicks. No pedal edema. Gastrointestinal system: Abdomen is nondistended, soft and nontender. No organomegaly or masses felt. Normal bowel sounds heard. Central nervous system: Alert and oriented. No focal neurological deficits. Extremities: Symmetric 5 x 5 power. Skin: No rashes, lesions or ulcers. Psychiatry: Judgement and insight appear normal. Mood & affect appropriate.   Data Reviewed: I have personally reviewed following labs and imaging studies  CBC: Recent Labs  Lab 09/16/23 1020 09/17/23 0405  WBC 12.9* 9.8  NEUTROABS 11.1* 7.6  HGB 10.2* 9.0*  HCT 34.6* 30.8*  MCV 89.4 90.1  PLT 426* 346    Basic Metabolic Panel: Recent Labs  Lab 09/16/23 1020 09/17/23 0405  NA 141 144  K 3.3* 3.4*  CL 104 107  CO2 26 26  GLUCOSE 106* 124*  BUN 17 14  CREATININE 1.34* 1.00  CALCIUM  9.5 9.1  MG  --  1.3*    CBG: Recent Labs  Lab 09/16/23 1610 09/17/23 0340 09/17/23 0747 09/17/23 1104  GLUCAP 107* 120* 139* 155*    Recent Results (from the past 240 hours)  Resp panel by RT-PCR (RSV, Flu A&B, Covid) Anterior Nasal Swab     Status: None   Collection Time: 09/16/23 10:18 AM   Specimen: Anterior Nasal Swab  Result Value Ref Range Status   SARS Coronavirus 2 by RT PCR NEGATIVE NEGATIVE Final    Comment: (NOTE) SARS-CoV-2 target nucleic acids are NOT DETECTED.  The SARS-CoV-2 RNA is generally detectable in upper respiratory specimens during the acute  phase of infection. The lowest concentration of SARS-CoV-2 viral copies this assay can detect is 138 copies/mL. A negative result does not preclude SARS-Cov-2 infection and should not be used as the sole basis for treatment or other patient management decisions. A negative result may occur with  improper specimen collection/handling, submission of specimen other than nasopharyngeal swab, presence of viral mutation(s) within the areas targeted by this assay, and inadequate number of viral copies(<138 copies/mL). A negative result must be combined with clinical observations, patient history, and epidemiological information. The expected result is Negative.  Fact Sheet for Patients:  bloggercourse.com  Fact Sheet for Healthcare Providers:  seriousbroker.it  This test is no t yet approved or cleared by the [place]  FDA and  has been authorized for detection and/or diagnosis of SARS-CoV-2 by FDA under an Emergency Use Authorization (EUA). This EUA will remain  in effect (meaning this test can be used) for the duration of the COVID-19 declaration under Section 564(b)(1) of the Act, 21 U.S.C.section 360bbb-3(b)(1), unless the authorization is terminated  or revoked sooner.       Influenza A by PCR NEGATIVE NEGATIVE  Final   Influenza B by PCR NEGATIVE NEGATIVE Final    Comment: (NOTE) The Xpert Xpress SARS-CoV-2/FLU/RSV plus assay is intended as an aid in the diagnosis of influenza from Nasopharyngeal swab specimens and should not be used as a sole basis for treatment. Nasal washings and aspirates are unacceptable for Xpert Xpress SARS-CoV-2/FLU/RSV testing.  Fact Sheet for Patients: bloggercourse.com  Fact Sheet for Healthcare Providers: seriousbroker.it  This test is not yet approved or cleared by the [place]  FDA and has been authorized for detection and/or diagnosis of  SARS-CoV-2 by FDA under an Emergency Use Authorization (EUA). This EUA will remain in effect (meaning this test can be used) for the duration of the COVID-19 declaration under Section 564(b)(1) of the Act, 21 U.S.C. section 360bbb-3(b)(1), unless the authorization is terminated or revoked.     Resp Syncytial Virus by PCR NEGATIVE NEGATIVE Final    Comment: (NOTE) Fact Sheet for Patients: bloggercourse.com  Fact Sheet for Healthcare Providers: seriousbroker.it  This test is not yet approved or cleared by the [place]  FDA and has been authorized for detection and/or diagnosis of SARS-CoV-2 by FDA under an Emergency Use Authorization (EUA). This EUA will remain in effect (meaning this test can be used) for the duration of the COVID-19 declaration under Section 564(b)(1) of the Act, 21 U.S.C. section 360bbb-3(b)(1), unless the authorization is terminated or revoked.  Performed at [facility], [address] [place], [place] [address]   Blood culture (routine x 2)     Status: None (Preliminary result)   Collection Time: 09/16/23 10:20 AM   Specimen: BLOOD  Result Value Ref Range Status   Specimen Description BLOOD BLOOD RIGHT ARM FOREARM  Final   Special Requests   Final    BOTTLES DRAWN AEROBIC AND ANAEROBIC Blood Culture adequate volume   Culture   Final    NO GROWTH < 24 HOURS Performed at [facility], [address] [place], [place] [address]    Report Status PENDING  Incomplete  Blood culture (routine x 2)     Status: None (Preliminary result)   Collection Time: 09/16/23 10:20 AM   Specimen: BLOOD  Result Value Ref Range Status   Specimen Description BLOOD BLOOD RIGHT ARM RAC  Final   Special Requests   Final    BOTTLES DRAWN AEROBIC AND ANAEROBIC Blood Culture adequate volume   Culture   Final    NO GROWTH < 24 HOURS Performed at [facility], [address] [place], [place] [address]    Report Status PENDING   Incomplete     Radiology Studies: CT ABDOMEN PELVIS W CONTRAST Result Date: 09/16/2023 CLINICAL DATA:  Status post fall with head strike and altered mental status. EXAM: CT ANGIOGRAPHY CHEST CT ABDOMEN AND PELVIS WITH CONTRAST TECHNIQUE: Multidetector CT imaging of the chest was performed using the standard protocol during bolus administration of intravenous contrast. Multiplanar CT image reconstructions and MIPs were obtained to evaluate the vascular anatomy. Multidetector CT imaging of the abdomen and pelvis was performed using the standard protocol during bolus administration of intravenous contrast. RADIATION DOSE REDUCTION: This exam was performed according to the departmental dose-optimization program which includes automated exposure control, adjustment of the mA and/or kV according to patient size and/or use of iterative reconstruction technique. CONTRAST:  OMNIPAQUE  IOHEXOL  350 MG/ML SOLN COMPARISON:  Same day chest radiograph FINDINGS: CTA CHEST FINDINGS Cardiovascular: The study is acceptable for the evaluation of pulmonary embolism, although there is motion artifact. There are no filling defects  in the central, lobar, segmental or subsegmental pulmonary artery branches to suggest acute pulmonary embolism. Great vessels are normal in course and caliber. Normal heart size. No significant pericardial fluid/thickening. Coronary artery calcifications. Mediastinum/Nodes: Imaged thyroid gland without nodules meeting criteria for imaging follow-up by size. Normal esophagus. No pathologically enlarged axillary, supraclavicular, mediastinal, or hilar lymph nodes. Lungs/Pleura: The central airways are patent. Mild-to-moderate upper lobe predominant centrilobular emphysema. Moderate diffuse bronchial wall thickening with right lower lobe subsegmental mucous plugging. Bilateral lower lobe subsegmental atelectasis. Irregular medial left upper lobe nodule measures 1.3 x 0.7 cm (6:39). Right apical 3 mm nodule  (6:39). No pneumothorax. No pleural effusion. Musculoskeletal: No acute or abnormal lytic or blastic osseous lesions. Multilevel degenerative changes of the thoracic spine. Review of the MIP images confirms the above findings. CT ABDOMEN and PELVIS FINDINGS Hepatobiliary: No focal hepatic lesions. No intra or extrahepatic biliary ductal dilation. Normal gallbladder. Pancreas: No focal lesions or main ductal dilation. Spleen: Normal in size without focal abnormality. Adrenals/Urinary Tract: 1.4 cm left adrenal nodule measures 57 HU. No right adrenal nodule. No suspicious renal mass, calculi, or hydronephrosis. Distended urinary bladder. Punctate focus of intraluminal gas. Stomach/Bowel: Normal appearance of the stomach. No evidence of bowel wall thickening, distention, or inflammatory changes. Colonic diverticulosis without acute diverticulitis. Normal appendix. Vascular/Lymphatic: Aortic atherosclerosis. No enlarged abdominal or pelvic lymph nodes. Reproductive: Heterogeneous enhancement of the prostate gland. Rounded hypoattenuating focus within the right hemi gland measures 1.5 cm (2:79), possibly a BPH nodule. Other: No free fluid, fluid collection, or free air. Musculoskeletal: No acute or abnormal lytic or blastic osseous lesions. Multilevel degenerative changes of the lumbar spine. IMPRESSION: CTA CHEST: 1. No evidence of pulmonary embolism. 2. Irregular medial left upper lobe nodule measures 1.3 x 0.7 cm. Consider one of the following in 3 months for both low-risk and high-risk individuals: (a) repeat chest CT, (b) follow-up PET-CT, or (c) tissue sampling. This recommendation follows the consensus statement: Guidelines for Management of Incidental Pulmonary Nodules Detected on CT Images: From the Fleischner Society 2017; Radiology 2017; 284:228-243. 3. Moderate diffuse bronchial wall thickening with right lower lobe subsegmental mucous plugging, as can be seen in the setting of bronchitis. CT ABDOMEN AND  PELVIS: 1. No acute abnormality in the abdomen or pelvis. 2. Distended urinary bladder with punctate focus of intraluminal gas, which may be related to recent instrumentation. Correlate with urinalysis to exclude cystitis. 3. Heterogeneous enhancement of the prostate gland. Rounded hypoattenuating focus within the right hemi gland measures 1.5 cm, possibly a BPH nodule. Recommend correlation with PSA. 4. Indeterminate 1.4 cm left adrenal nodule. Recommend further evaluation with adrenal protocol CT or MRI. 5. Aortic Atherosclerosis (ICD10-I70.0) and Emphysema (ICD10-J43.9). Coronary artery calcifications. Assessment for potential risk factor modification, dietary therapy or pharmacologic therapy may be warranted, if clinically indicated. Electronically Signed   By: Limin  Xu M.D.   On: 09/16/2023 13:17   CT Angio Chest PE W/Cm &/Or Wo Cm Result Date: 09/16/2023 CLINICAL DATA:  Status post fall with head strike and altered mental status. EXAM: CT ANGIOGRAPHY CHEST CT ABDOMEN AND PELVIS WITH CONTRAST TECHNIQUE: Multidetector CT imaging of the chest was performed using the standard protocol during bolus administration of intravenous contrast. Multiplanar CT image reconstructions and MIPs were obtained to evaluate the vascular anatomy. Multidetector CT imaging of the abdomen and pelvis was performed using the standard protocol during bolus administration of intravenous contrast. RADIATION DOSE REDUCTION: This exam was performed according to the departmental dose-optimization program which includes automated exposure  control, adjustment of the mA and/or kV according to patient size and/or use of iterative reconstruction technique. CONTRAST:  OMNIPAQUE  IOHEXOL  350 MG/ML SOLN COMPARISON:  Same day chest radiograph FINDINGS: CTA CHEST FINDINGS Cardiovascular: The study is acceptable for the evaluation of pulmonary embolism, although there is motion artifact. There are no filling defects in the central, lobar,  segmental or subsegmental pulmonary artery branches to suggest acute pulmonary embolism. Great vessels are normal in course and caliber. Normal heart size. No significant pericardial fluid/thickening. Coronary artery calcifications. Mediastinum/Nodes: Imaged thyroid gland without nodules meeting criteria for imaging follow-up by size. Normal esophagus. No pathologically enlarged axillary, supraclavicular, mediastinal, or hilar lymph nodes. Lungs/Pleura: The central airways are patent. Mild-to-moderate upper lobe predominant centrilobular emphysema. Moderate diffuse bronchial wall thickening with right lower lobe subsegmental mucous plugging. Bilateral lower lobe subsegmental atelectasis. Irregular medial left upper lobe nodule measures 1.3 x 0.7 cm (6:39). Right apical 3 mm nodule (6:39). No pneumothorax. No pleural effusion. Musculoskeletal: No acute or abnormal lytic or blastic osseous lesions. Multilevel degenerative changes of the thoracic spine. Review of the MIP images confirms the above findings. CT ABDOMEN and PELVIS FINDINGS Hepatobiliary: No focal hepatic lesions. No intra or extrahepatic biliary ductal dilation. Normal gallbladder. Pancreas: No focal lesions or main ductal dilation. Spleen: Normal in size without focal abnormality. Adrenals/Urinary Tract: 1.4 cm left adrenal nodule measures 57 HU. No right adrenal nodule. No suspicious renal mass, calculi, or hydronephrosis. Distended urinary bladder. Punctate focus of intraluminal gas. Stomach/Bowel: Normal appearance of the stomach. No evidence of bowel wall thickening, distention, or inflammatory changes. Colonic diverticulosis without acute diverticulitis. Normal appendix. Vascular/Lymphatic: Aortic atherosclerosis. No enlarged abdominal or pelvic lymph nodes. Reproductive: Heterogeneous enhancement of the prostate gland. Rounded hypoattenuating focus within the right hemi gland measures 1.5 cm (2:79), possibly a BPH nodule. Other: No free fluid,  fluid collection, or free air. Musculoskeletal: No acute or abnormal lytic or blastic osseous lesions. Multilevel degenerative changes of the lumbar spine. IMPRESSION: CTA CHEST: 1. No evidence of pulmonary embolism. 2. Irregular medial left upper lobe nodule measures 1.3 x 0.7 cm. Consider one of the following in 3 months for both low-risk and high-risk individuals: (a) repeat chest CT, (b) follow-up PET-CT, or (c) tissue sampling. This recommendation follows the consensus statement: Guidelines for Management of Incidental Pulmonary Nodules Detected on CT Images: From the Fleischner Society 2017; Radiology 2017; 284:228-243. 3. Moderate diffuse bronchial wall thickening with right lower lobe subsegmental mucous plugging, as can be seen in the setting of bronchitis. CT ABDOMEN AND PELVIS: 1. No acute abnormality in the abdomen or pelvis. 2. Distended urinary bladder with punctate focus of intraluminal gas, which may be related to recent instrumentation. Correlate with urinalysis to exclude cystitis. 3. Heterogeneous enhancement of the prostate gland. Rounded hypoattenuating focus within the right hemi gland measures 1.5 cm, possibly a BPH nodule. Recommend correlation with PSA. 4. Indeterminate 1.4 cm left adrenal nodule. Recommend further evaluation with adrenal protocol CT or MRI. 5. Aortic Atherosclerosis (ICD10-I70.0) and Emphysema (ICD10-J43.9). Coronary artery calcifications. Assessment for potential risk factor modification, dietary therapy or pharmacologic therapy may be warranted, if clinically indicated. Electronically Signed   By: Limin  Xu M.D.   On: 09/16/2023 13:17   CT Head Wo Contrast Result Date: 09/16/2023 CLINICAL DATA:  Head trauma, minor (Age >= 65y); Neck trauma (Age >= 65y) EXAM: CT HEAD WITHOUT CONTRAST CT CERVICAL SPINE WITHOUT CONTRAST TECHNIQUE: Multidetector CT imaging of the head and cervical spine was performed following the standard protocol  without intravenous contrast. Multiplanar  CT image reconstructions of the cervical spine were also generated. RADIATION DOSE REDUCTION: This exam was performed according to the departmental dose-optimization program which includes automated exposure control, adjustment of the mA and/or kV according to patient size and/or use of iterative reconstruction technique. COMPARISON:  None Available. FINDINGS: CT HEAD FINDINGS Brain: No hemorrhage. No hydrocephalus. No extra-axial fluid collection. No CT evidence of an acute cortical infarct. No mass effect. No mass lesion. Vascular: No hyperdense vessel or unexpected calcification. Skull: Normal. Negative for fracture or focal lesion. Sinuses/Orbits: No middle ear or mastoid effusion. Paranasal sinuses notable for mild mucosal thickening in the left maxillary sinus. Orbits are unremarkable. Other: None. CT CERVICAL SPINE FINDINGS Alignment: Normal. Skull base and vertebrae: No acute fracture. No primary bone lesion or focal pathologic process. Soft tissues and spinal canal: No prevertebral fluid or swelling. No visible canal hematoma. Disc levels:  No CT evidence of high-grade spinal canal stenosis Upper chest: Negative. Other: None IMPRESSION: 1. No CT evidence of intracranial injury. 2. No acute fracture or traumatic subluxation of the cervical spine. Electronically Signed   By: Lyndall Gore M.D.   On: 09/16/2023 13:03   CT Cervical Spine Wo Contrast Result Date: 09/16/2023 CLINICAL DATA:  Head trauma, minor (Age >= 65y); Neck trauma (Age >= 65y) EXAM: CT HEAD WITHOUT CONTRAST CT CERVICAL SPINE WITHOUT CONTRAST TECHNIQUE: Multidetector CT imaging of the head and cervical spine was performed following the standard protocol without intravenous contrast. Multiplanar CT image reconstructions of the cervical spine were also generated. RADIATION DOSE REDUCTION: This exam was performed according to the departmental dose-optimization program which includes automated exposure control, adjustment of the mA and/or kV  according to patient size and/or use of iterative reconstruction technique. COMPARISON:  None Available. FINDINGS: CT HEAD FINDINGS Brain: No hemorrhage. No hydrocephalus. No extra-axial fluid collection. No CT evidence of an acute cortical infarct. No mass effect. No mass lesion. Vascular: No hyperdense vessel or unexpected calcification. Skull: Normal. Negative for fracture or focal lesion. Sinuses/Orbits: No middle ear or mastoid effusion. Paranasal sinuses notable for mild mucosal thickening in the left maxillary sinus. Orbits are unremarkable. Other: None. CT CERVICAL SPINE FINDINGS Alignment: Normal. Skull base and vertebrae: No acute fracture. No primary bone lesion or focal pathologic process. Soft tissues and spinal canal: No prevertebral fluid or swelling. No visible canal hematoma. Disc levels:  No CT evidence of high-grade spinal canal stenosis Upper chest: Negative. Other: None IMPRESSION: 1. No CT evidence of intracranial injury. 2. No acute fracture or traumatic subluxation of the cervical spine. Electronically Signed   By: Lyndall Gore M.D.   On: 09/16/2023 13:03   DG Chest Portable 1 View Result Date: 09/16/2023 CLINICAL DATA:  Fall. EXAM: PORTABLE CHEST 1 VIEW COMPARISON:  09/02/2023. FINDINGS: Bilateral lung fields are clear. Bilateral costophrenic angles are clear. Normal cardio-mediastinal silhouette. No acute osseous abnormalities. The soft tissues are within normal limits. IMPRESSION: No active disease. Electronically Signed   By: Ree Molt M.D.   On: 09/16/2023 11:29   DG Pelvis Portable Result Date: 09/16/2023 CLINICAL DATA:  Fall. EXAM: PORTABLE PELVIS 1-2 VIEWS COMPARISON:  None Available. FINDINGS: Pelvis is intact with normal and symmetric sacroiliac joints. No acute fracture or dislocation. No aggressive osseous lesion. Visualized sacral arcuate lines are unremarkable. Unremarkable symphysis pubis. There are mild degenerative changes of bilateral hip joints without significant  joint space narrowing. Osteophytosis of the superior acetabulum. No radiopaque foreign bodies. IMPRESSION: *No acute osseous abnormality of  the pelvis. Electronically Signed   By: Ree Molt M.D.   On: 09/16/2023 11:26    Scheduled Meds:  atorvastatin   80 mg Oral q1800   cyanocobalamin   1,000 mcg Oral Daily   docusate sodium   100 mg Oral BID   enoxaparin  (LOVENOX ) injection  40 mg Subcutaneous Q24H   fluticasone  furoate-vilanterol  1 puff Inhalation Daily   furosemide   40 mg Oral Daily   guaiFENesin   600 mg Oral BID   insulin  aspart  0-15 Units Subcutaneous TID WC   ipratropium-albuterol   3 mL Nebulization TID   metoprolol  tartrate  25 mg Oral BID   mirtazapine   15 mg Oral QHS   multivitamin with minerals  1 tablet Oral Daily   naltrexone   50 mg Oral Daily   omega-3 acid ethyl esters  1,000 mg Oral Daily   pantoprazole   40 mg Oral Daily   potassium chloride  SA  20 mEq Oral BID   risperiDONE   1 mg Oral QHS   tamsulosin   0.4 mg Oral Daily   thiamine   250 mg Oral Daily   Continuous Infusions:   LOS: 1 day   Time spent: 56 mins  Zyiah Withington Vicci, MD How to contact the [facility] Attending or Consulting provider 7A - 7P or covering provider during after hours 7P -7A, for this patient?  Check the care team in [facility] and look for a) attending/consulting TRH provider listed and b) the TRH team listed Log into [email or web address] to find provider on call.  Locate the TRH provider you are looking for under [facility] and page to a number that you can be directly reached. If you still have difficulty reaching the provider, please page the [facility] (Director on Call) for the Hospitalists listed on amion for assistance.  09/17/2023, 1:57 PM

## 2023-09-17 NOTE — TOC Initial Note (Signed)
 Transition of Care [facility]) - Initial/Assessment Note    Patient Details  Name: Jeff Miles MRN: [id number] Date of Birth: 06/09/1951  Transition of Care [facility]) CM/SW Contact:    Lorraine LILLETTE Fenton, LCSW Phone Number: 09/17/2023, 5:01 PM  Clinical Narrative:                 Pt is high risk for readmission. CSW completed assessment with pts spouse. Pt is mostly independent in completing ADLs. Pt was recently driving with spouse in car to appointments when needed. He has recently had falls and spouse is concerned he needs additional treatment. Pt has a cane in the home,BSC, and 02- he is compliant with them most of the time. Spouse is concerned that pt may need SNF following falls and concerned about safety as pt is getting up at night, walking around and could further injure himself.   CSW sent [place] to MD requesting PT evaluation as SNF requested. MD entered order.  CSW then contacted [facility] and they indicated he is active with HHPT and   TOC can request SNF once PT evaluates. TOC to follow.    Expected Discharge Plan: Skilled Nursing Facility Barriers to Discharge: Continued Medical Work up   Patient Goals and CMS Choice Patient states their goals for this hospitalization and ongoing recovery are:: DC to SNF [facility]          Expected Discharge Plan and Services In-house Referral: Clinical Social Work     Living arrangements for the past 2 months: Single Family Home                                      Prior Living Arrangements/Services Living arrangements for the past 2 months: Single Family Home Lives with:: Self Patient language and need for interpreter reviewed:: Yes Do you feel safe going back to the place where you live?: Yes      Need for Family Participation in Patient Care: Yes (Comment) Care giver support system in place?: Yes (comment)   Criminal Activity/Legal Involvement Pertinent to Current Situation/Hospitalization: No - Comment as needed  Activities of  Daily Living   ADL Screening (condition at time of admission) Independently performs ADLs?: No Does the patient have a NEW difficulty with bathing/dressing/toileting/self-feeding that is expected to last >3 days?: Yes (Initiates electronic notice to provider for possible OT consult) Does the patient have a NEW difficulty with getting in/out of bed, walking, or climbing stairs that is expected to last >3 days?: Yes (Initiates electronic notice to provider for possible PT consult) Does the patient have a NEW difficulty with communication that is expected to last >3 days?: No Is the patient deaf or have difficulty hearing?: No Does the patient have difficulty seeing, even when wearing glasses/contacts?: No Does the patient have difficulty concentrating, remembering, or making decisions?: Yes  Permission Sought/Granted                  Emotional Assessment       Orientation: : Oriented to Self Alcohol  / Substance Use: Not Applicable    Admission diagnosis:  Altered mental status, unspecified altered mental status type [R41.82] Acute metabolic encephalopathy [G93.41] Patient Active Problem List   Diagnosis Date Noted   Dementia with behavioral disturbance (HCC) 09/16/2023   FTT (failure to thrive) in adult 09/16/2023   Leukocytosis 09/16/2023   BPH (benign prostatic hyperplasia) 09/16/2023   Frequent falls 09/16/2023  Wernicke's encephalopathy    CHF (congestive heart failure) (HCC)    Hard of hearing    Unsteady gait    Weakness of both legs    Tachycardia 09/02/2023   Acute respiratory failure with hypoxia (HCC) 09/02/2023   COPD with acute exacerbation (HCC) 09/02/2023   Pressure injury of skin 08/31/2023   AMS (altered mental status) 08/30/2023   Acute metabolic encephalopathy 08/30/2023   Weakness 08/30/2023   Fall 08/30/2023   Hypokalemia 08/30/2023   Gastric polyp 06/28/2023   Gastritis and gastroduodenitis 06/28/2023   Grade II hemorrhoids 06/28/2023    Diabetes mellitus type 2 in nonobese (HCC) 06/27/2023   Anxiety 06/27/2023   GERD (gastroesophageal reflux disease) 06/27/2023   Hypomagnesemia 06/27/2023   Hematochezia 06/27/2023   GI bleed 06/26/2023   Iron  deficiency anemia, unspecified 04/09/2022   Iron  deficiency 04/07/2022   Rectal bleeding 08/31/2018   Heme + stool 08/31/2018   Abnormal LFTs 08/31/2018   Aspiration into airway    Acute on chronic diastolic (congestive) heart failure (HCC)    Restlessness and agitation    Alcohol  withdrawal (HCC) 10/25/2015   Bronchospasm 10/24/2015   COPD (chronic obstructive pulmonary disease) (HCC) 10/24/2015   Hypoxemia 10/24/2015   URI (upper respiratory infection) 10/24/2015   Hypertensive urgency 10/24/2015   PCP:  [name], MD Pharmacy:   [facility] [address] [place] - [address] [place] [address] Phone: [phone] Fax: [phone]  [facility] - [place], [place] - [address] [place] [address] Phone: [phone] Fax: [phone]     Social Drivers of Health (SDOH) Social History: SDOH Screenings   Food Insecurity: No Food Insecurity (09/16/2023)  Housing: Low Risk  (09/16/2023)  Transportation Needs: No Transportation Needs (09/16/2023)  Utilities: Not At Risk (09/16/2023)  Social Connections: Patient Unable To Answer (09/16/2023)  Tobacco Use: Medium Risk (09/16/2023)   SDOH Interventions:     Readmission Risk Interventions    09/17/2023    4:45 PM  Readmission Risk Prevention Plan  Transportation Screening Complete  PCP or Specialist Appt within 3-5 Days Complete  HRI or Home Care Consult Complete  Social Work Consult for Recovery Care Planning/Counseling Complete  Palliative Care Screening Not Applicable  Medication Review Oceanographer) Complete

## 2023-09-18 DIAGNOSIS — F03918 Unspecified dementia, unspecified severity, with other behavioral disturbance: Secondary | ICD-10-CM

## 2023-09-18 LAB — GLUCOSE, CAPILLARY
Glucose-Capillary: 131 mg/dL — ABNORMAL HIGH (ref 70–99)
Glucose-Capillary: 140 mg/dL — ABNORMAL HIGH (ref 70–99)
Glucose-Capillary: 167 mg/dL — ABNORMAL HIGH (ref 70–99)
Glucose-Capillary: 151 mg/dL — ABNORMAL HIGH (ref 70–99)
Glucose-Capillary: 155 mg/dL — ABNORMAL HIGH (ref 70–99)

## 2023-09-18 LAB — BASIC METABOLIC PANEL
Anion gap: 10 (ref 5–15)
BUN: 11 mg/dL (ref 8–23)
CO2: 28 mmol/L (ref 22–32)
Calcium: 8.8 mg/dL — ABNORMAL LOW (ref 8.9–10.3)
Chloride: 101 mmol/L (ref 98–111)
Creatinine, Ser: 0.94 mg/dL (ref 0.61–1.24)
GFR, Estimated: >60 mL/min (ref >60)
Glucose, Bld: 147 mg/dL — ABNORMAL HIGH (ref 70–99)
Potassium: 3.4 mmol/L — ABNORMAL LOW (ref 3.5–5.1)
Sodium: 139 mmol/L (ref 135–145)

## 2023-09-18 LAB — MAGNESIUM: Magnesium: 1.7 mg/dL (ref 1.7–2.4)

## 2023-09-18 MED ORDER — POTASSIUM CHLORIDE CRYS ER 20 MEQ PO TBCR
20.0000 meq | EXTENDED_RELEASE_TABLET | Freq: Two times a day (BID) | ORAL | Status: DC
  Administered 2023-09-18 – 2023-09-29 (×24): 20 meq via ORAL
  Filled 2023-09-18 (×25): qty 1

## 2023-09-18 MED ORDER — MAGNESIUM SULFATE 2 GM/50ML IV SOLN
2.0000 g | Freq: Once | INTRAVENOUS | Status: AC
  Administered 2023-09-18: 2 g via INTRAVENOUS
  Filled 2023-09-18: qty 50

## 2023-09-18 MED ORDER — HALOPERIDOL LACTATE 5 MG/ML IJ SOLN
3.0000 mg | Freq: Four times a day (QID) | INTRAMUSCULAR | Status: DC | PRN
  Administered 2023-09-18: 3 mg via INTRAVENOUS
  Filled 2023-09-18: qty 1

## 2023-09-18 NOTE — Evaluation (Signed)
 Physical Therapy Evaluation Patient Details Name: Jeff Miles MRN: [id number] DOB: 1950/12/21 Today's Date: 09/18/2023  History of Present Illness  73 year old male with advanced dementia associated with history of alcohol  abuse and Korsakoff's encephalopathy, hypertension, COPD, former longtime smoker, iron  deficiency anemia, type 2 diabetes mellitus, GAD, GERD, BPH with urinary retention, failure to thrive, frequent falls has had several recent hospitalizations most recently discharged on 09/03/2023 after being hospitalized for urinary retention, acute on chronic respiratory failure and metabolic encephalopathy and he was discharged home with home health services with his wife as primary caretaker.  She reports that for the past several days he has had increasing confusion and wandering at night and falling multiple times at home.  He had canceled his outpatient appointment to have a colonoscopy on 09/28/2023.  This was part of a workup for guaiac positive stools and recent colonoscopy was incomplete due to poor prep.  Wife reports that it has been unsafe for him to remain at home because she is unable to care for him due to his frequent falls and nighttime wandering behavior.  She reports that he fell out of a chair this morning and struck his head on the floor.  He was discharged with a Foley catheter but has since been removed and he has been voiding per wife.  She reports that she is unable to care for him at home in his current state.  His ED trauma workup has been negative for acute findings.  He is being admitted for frequent falls and encephalopathy.  Clinical Impression   Pt tolerated today's Physical Therapy Evaluation, well although showing decreased carryover for mobility due to mainly fatigue and generalized weakness.  Poor historian noted when PLOF questioning, benefit from significant cueing and hand overhand techniques intermittently for proper placement due to hardness of hearing..  Pt demonstrating limitations in ADL, functional mobility, and ambulation due to generalized weakness, balance deficits.. Based upon these deficits/impairments, patient will benefit from continued skilled physical therapy services during remainder of hospital stay and at the next recommended venue of care to address deficits and promote return to optimal function.               If plan is discharge home, recommend the following: A little help with walking and/or transfers;A little help with bathing/dressing/bathroom;Help with stairs or ramp for entrance;Assistance with cooking/housework   Can travel by private vehicle        Equipment Recommendations    Recommendations for Other Services       Functional Status Assessment Patient has had a recent decline in their functional status and demonstrates the ability to make significant improvements in function in a reasonable and predictable amount of time.     Precautions / Restrictions Precautions Precautions: Fall Restrictions Weight Bearing Restrictions Per Provider Order: No      Mobility  Bed Mobility Overal bed mobility: Needs Assistance Bed Mobility: Supine to Sit, Sit to Supine     Supine to sit: Supervision, Mod assist     General bed mobility comments: HOB elevated, slow labored movements.  Mod assist for returning supine to sit with assist at BLE. Patient Response: Cooperative  Transfers Overall transfer level: Needs assistance Equipment used: Rolling walker (2 wheels) Transfers: Sit to/from Stand Sit to Stand: Min assist           General transfer comment: labored unsteady movement, with min assist of power to stand from edge of bed.    Ambulation/Gait Ambulation/Gait assistance: Contact guard assist  Gait Distance (Feet): 15 Feet Assistive device: Rolling walker (2 wheels) Gait Pattern/deviations: Decreased step length - left, Decreased stance time - right, Decreased stride length, Shuffle        General Gait Details: Limited gait distance due to fatigue, unsteadiness and turning.  Stairs            Wheelchair Mobility     Tilt Bed Tilt Bed Patient Response: Cooperative  Modified Rankin (Stroke Patients Only)       Balance Overall balance assessment: Needs assistance Sitting-balance support: Feet supported, No upper extremity supported Sitting balance-Leahy Scale: Fair Sitting balance - Comments: fair/good seated at EOB   Standing balance support: During functional activity, No upper extremity supported Standing balance-Leahy Scale: Fair Standing balance comment: fair/poor without AD, fair/good using RW                             Pertinent Vitals/Pain Pain Assessment Pain Assessment: No/denies pain    Home Living Family/patient expects to be discharged to:: Private residence Living Arrangements: Spouse/significant other Available Help at Discharge: Family;Available 24 hours/day Type of Home: House Home Access: Stairs to enter Entrance Stairs-Rails: None Entrance Stairs-Number of Steps: 1   Home Layout: One level   Additional Comments: Patient is poor historian    Prior Function Prior Level of Function : Needs assist       Physical Assist : Mobility (physical);ADLs (physical) Mobility (physical): Bed mobility;Transfers;Gait;Stairs   Mobility Comments: Pt reports independent community mobility without AD. ADLs Comments: Pt reports independent ADL's     Extremity/Trunk Assessment   Upper Extremity Assessment Upper Extremity Assessment: Defer to OT evaluation    Lower Extremity Assessment Lower Extremity Assessment: Generalized weakness    Cervical / Trunk Assessment Cervical / Trunk Assessment: Kyphotic  Communication   Communication Communication: Hearing impairment Cueing Techniques: Verbal cues;Tactile cues  Cognition Arousal: Alert Behavior During Therapy: WFL for tasks assessed/performed Overall Cognitive Status:  History of cognitive impairments - at baseline                                          General Comments      Exercises     Assessment/Plan    PT Assessment Patient needs continued PT services  PT Problem List Decreased strength;Decreased activity tolerance;Decreased balance;Decreased mobility       PT Treatment Interventions DME instruction;Gait training;Stair training;Therapeutic activities;Functional mobility training;Therapeutic exercise;Balance training;Patient/family education    PT Goals (Current goals can be found in the Care Plan section)  Acute Rehab PT Goals Patient Stated Goal: return home with family to assist PT Goal Formulation: With patient Time For Goal Achievement: 10/02/23 Potential to Achieve Goals: Good    Frequency Min 3X/week     Co-evaluation               AM-PAC PT 6 Clicks Mobility  Outcome Measure Help needed turning from your back to your side while in a flat bed without using bedrails?: A Little Help needed moving from lying on your back to sitting on the side of a flat bed without using bedrails?: A Little Help needed moving to and from a bed to a chair (including a wheelchair)?: A Little Help needed standing up from a chair using your arms (e.g., wheelchair or bedside chair)?: A Little Help needed to walk in hospital room?: A Lot  Help needed climbing 3-5 steps with a railing? : Total 6 Click Score: 15    End of Session Equipment Utilized During Treatment: Gait belt Activity Tolerance: Patient tolerated treatment well;Patient limited by fatigue Patient left: in chair;with call bell/phone within reach;with chair alarm set Nurse Communication: Mobility status PT Visit Diagnosis: Unsteadiness on feet (R26.81);Other abnormalities of gait and mobility (R26.89);Muscle weakness (generalized) (M62.81)    Time: 8879-8862 PT Time Calculation (min) (ACUTE ONLY): 17 min   Charges:   PT Evaluation $PT Eval Low  Complexity: 1 Low   PT General Charges $$ ACUTE PT VISIT: 1 Visit         Omega JONETTA Donna ALMETA, DPT [facility] [place] 336 [phone] office   Omega JONETTA Donna 09/18/2023, 12:10 PM

## 2023-09-18 NOTE — Plan of Care (Signed)
  Problem: Acute Rehab PT Goals(only PT should resolve) Goal: Pt Will Go Supine/Side To Sit Flowsheets (Taken 09/18/2023 1212) Pt will go Supine/Side to Sit: Independently Goal: Patient Will Transfer Sit To/From Stand Flowsheets (Taken 09/18/2023 1212) Patient will transfer sit to/from stand: Independently Goal: Pt Will Perform Standing Balance Or Pre-Gait Flowsheets (Taken 09/18/2023 1212) Pt will perform standing balance or pre-gait: Independently Goal: Pt Will Ambulate Flowsheets (Taken 09/18/2023 1212) Pt will Ambulate:  50 feet  75 feet  with modified independence  with rolling walker  Jeff Miles, DPT [facility]-  [phone] office

## 2023-09-18 NOTE — Plan of Care (Signed)

## 2023-09-18 NOTE — NC FL2 (Signed)
 [place]  MEDICAID FL2 LEVEL OF CARE FORM     IDENTIFICATION  Patient Name: Jeff Miles Birthdate: 1950-10-03 Sex: male Admission Date (Current Location): 09/16/2023  [facility] [place] Number:  [employer] and Address:  [facility],  [address], [place] [address]      Provider Number: [id number]  Attending Physician Name and Address:  Vicci Afton LITTIE, MD  Relative Name and Phone Number:       Current Level of Care: Hospital Recommended Level of Care: Skilled Nursing Facility Prior Approval Number:    Date Approved/Denied:   PASRR Number: [id number]  Discharge Plan: SNF    Current Diagnoses: Patient Active Problem List   Diagnosis Date Noted   Dementia with behavioral disturbance (HCC) 09/16/2023   FTT (failure to thrive) in adult 09/16/2023   Leukocytosis 09/16/2023   BPH (benign prostatic hyperplasia) 09/16/2023   Frequent falls 09/16/2023   Wernicke's encephalopathy    CHF (congestive heart failure) (HCC)    Hard of hearing    Unsteady gait    Weakness of both legs    Tachycardia 09/02/2023   Acute respiratory failure with hypoxia (HCC) 09/02/2023   COPD with acute exacerbation (HCC) 09/02/2023   Pressure injury of skin 08/31/2023   AMS (altered mental status) 08/30/2023   Acute metabolic encephalopathy 08/30/2023   Weakness 08/30/2023   Fall 08/30/2023   Hypokalemia 08/30/2023   Gastric polyp 06/28/2023   Gastritis and gastroduodenitis 06/28/2023   Grade II hemorrhoids 06/28/2023   Diabetes mellitus type 2 in nonobese (HCC) 06/27/2023   Anxiety 06/27/2023   GERD (gastroesophageal reflux disease) 06/27/2023   Hypomagnesemia 06/27/2023   Hematochezia 06/27/2023   GI bleed 06/26/2023   Iron  deficiency anemia, unspecified 04/09/2022   Iron  deficiency 04/07/2022   Rectal bleeding 08/31/2018   Heme + stool 08/31/2018   Abnormal LFTs 08/31/2018   Aspiration into airway    Acute on chronic diastolic (congestive)  heart failure (HCC)    Restlessness and agitation    Alcohol  withdrawal (HCC) 10/25/2015   Bronchospasm 10/24/2015   COPD (chronic obstructive pulmonary disease) (HCC) 10/24/2015   Hypoxemia 10/24/2015   URI (upper respiratory infection) 10/24/2015   Hypertensive urgency 10/24/2015    Orientation RESPIRATION BLADDER Height & Weight     Self  O2 (3L) Continent Weight: 200 lb 9.9 oz (91 kg) Height:  5' 9 (175.3 cm)  BEHAVIORAL SYMPTOMS/MOOD NEUROLOGICAL BOWEL NUTRITION STATUS      Continent Diet (see dc summary)  AMBULATORY STATUS COMMUNICATION OF NEEDS Skin   Extensive Assist Verbally PU Stage and Appropriate Care (L buttock and sacrum) PU Stage 1 Dressing: Daily                     Personal Care Assistance Level of Assistance  Bathing, Feeding, Dressing Bathing Assistance: Limited assistance Feeding assistance: Independent Dressing Assistance: Limited assistance     Functional Limitations Info  Sight, Hearing, Speech Sight Info: Adequate Hearing Info: Adequate Speech Info: Adequate    SPECIAL CARE FACTORS FREQUENCY  PT (By licensed PT), OT (By licensed OT)     PT Frequency: 5x week OT Frequency: 5x week            Contractures Contractures Info: Not present    Additional Factors Info  Code Status, Allergies Code Status Info: Full Allergies Info: NKA           Current Medications (09/18/2023):  This is the current hospital active medication list Current Facility-Administered  Medications  Medication Dose Route Frequency Provider Last Rate Last Admin   acetaminophen  (TYLENOL ) tablet 650 mg  650 mg Oral Q6H PRN Johnson, Clanford L, MD       Or   acetaminophen  (TYLENOL ) suppository 650 mg  650 mg Rectal Q6H PRN Johnson, Clanford L, MD       atorvastatin  (LIPITOR) tablet 80 mg  80 mg Oral q1800 Johnson, Clanford L, MD   80 mg at 09/17/23 1720   cyanocobalamin  (VITAMIN B12) tablet 1,000 mcg  1,000 mcg Oral Daily Johnson, Clanford L, MD   1,000 mcg at  09/18/23 0850   docusate sodium  (COLACE) capsule 100 mg  100 mg Oral BID Johnson, Clanford L, MD   100 mg at 09/18/23 0849   enoxaparin  (LOVENOX ) injection 40 mg  40 mg Subcutaneous Q24H Johnson, Clanford L, MD   40 mg at 09/17/23 2121   fentaNYL  (SUBLIMAZE ) injection 12.5 mcg  12.5 mcg Intravenous Q2H PRN Johnson, Clanford L, MD       fluticasone  furoate-vilanterol (BREO ELLIPTA ) 100-25 MCG/ACT 1 puff  1 puff Inhalation Daily Johnson, Clanford L, MD   1 puff at 09/18/23 0805   furosemide  (LASIX ) tablet 40 mg  40 mg Oral Daily Johnson, Clanford L, MD   40 mg at 09/18/23 0849   guaiFENesin  (MUCINEX ) 12 hr tablet 600 mg  600 mg Oral BID Johnson, Clanford L, MD   600 mg at 09/18/23 0849   haloperidol  lactate (HALDOL ) injection 3 mg  3 mg Intravenous Q6H PRN Vicci, Clanford L, MD   3 mg at 09/18/23 1450   insulin  aspart (novoLOG ) injection 0-15 Units  0-15 Units Subcutaneous TID WC Johnson, Clanford L, MD   3 Units at 09/18/23 1145   ipratropium-albuterol  (DUONEB) 0.5-2.5 (3) MG/3ML nebulizer solution 3 mL  3 mL Nebulization TID Vicci, Clanford L, MD   3 mL at 09/18/23 1354   LORazepam  (ATIVAN ) tablet 0.5 mg  0.5 mg Oral BID PRN Vicci, Clanford L, MD   0.5 mg at 09/18/23 1410   metoprolol  tartrate (LOPRESSOR ) tablet 25 mg  25 mg Oral BID Johnson, Clanford L, MD   25 mg at 09/18/23 9150   mirtazapine  (REMERON ) tablet 15 mg  15 mg Oral QHS Johnson, Clanford L, MD   15 mg at 09/17/23 2121   multivitamin with minerals tablet 1 tablet  1 tablet Oral Daily Vicci, Clanford L, MD   1 tablet at 09/18/23 0854   naltrexone  (DEPADE) tablet 50 mg  50 mg Oral Daily Johnson, Clanford L, MD   50 mg at 09/18/23 0851   omega-3 acid ethyl esters (LOVAZA ) capsule 1,000 mg  1,000 mg Oral Daily Johnson, Clanford L, MD   1,000 mg at 09/18/23 0850   ondansetron  (ZOFRAN ) tablet 4 mg  4 mg Oral Q6H PRN Johnson, Clanford L, MD       Or   ondansetron  (ZOFRAN ) injection 4 mg  4 mg Intravenous Q6H PRN Johnson, Clanford L,  MD       oxyCODONE  (Oxy IR/ROXICODONE ) immediate release tablet 5 mg  5 mg Oral Q6H PRN Johnson, Clanford L, MD       pantoprazole  (PROTONIX ) EC tablet 40 mg  40 mg Oral Daily Johnson, Clanford L, MD   40 mg at 09/18/23 0849   potassium chloride  SA (KLOR-CON  M) CR tablet 20 mEq  20 mEq Oral BID Johnson, Clanford L, MD   20 mEq at 09/18/23 0849   risperiDONE  (RISPERDAL ) tablet 1 mg  1 mg Oral QHS  Johnson, Clanford L, MD   1 mg at 09/17/23 2121   tamsulosin  (FLOMAX ) capsule 0.4 mg  0.4 mg Oral Daily Johnson, Clanford L, MD   0.4 mg at 09/18/23 9150   thiamine  (VITAMIN B1) tablet 250 mg  250 mg Oral Daily Vicci, Clanford L, MD   250 mg at 09/18/23 9150     Discharge Medications: Please see discharge summary for a list of discharge medications.  Relevant Imaging Results:  Relevant Lab Results:   Additional Information SSN: [id number], LCSW

## 2023-09-18 NOTE — Progress Notes (Addendum)
 PROGRESS NOTE   Jeff Miles  FMW:[id number] DOB: [birth year] DOA: 09/16/2023 PCP: [name], MD   Chief Complaint  Patient presents with   Altered Mental Status   Level of care: Telemetry  Brief Admission History:  73 year old male with advanced dementia associated with history of alcohol  abuse and Korsakoff's encephalopathy, hypertension, COPD, former longtime smoker, iron  deficiency anemia, type 2 diabetes mellitus, GAD, GERD, BPH with urinary retention, failure to thrive, frequent falls has had several recent hospitalizations most recently discharged on 09/03/2023 after being hospitalized for urinary retention, acute on chronic respiratory failure and metabolic encephalopathy and he was discharged home with home health services with his wife as primary caretaker.  She reports that for the past several days he has had increasing confusion and wandering at night and falling multiple times at home.  He had canceled his outpatient appointment to have a colonoscopy on 09/28/2023.  This was part of a workup for guaiac positive stools and recent colonoscopy was incomplete due to poor prep.  Wife reports that it has been unsafe for him to remain at home because she is unable to care for him due to his frequent falls and nighttime wandering behavior.  She reports that he fell out of a chair this morning and struck his head on the floor.  He was discharged with a Foley catheter but has since been removed and he has been voiding per wife.  She reports that she is unable to care for him at home in his current state.  His ED trauma workup has been negative for acute findings.  He is being admitted for frequent falls and encephalopathy.   Assessment and Plan:  Acute metabolic encephalopathy -Likely underlying advanced dementia -No acute metabolic abnormalities found -Treated with IV fluids in the ED for mild dehydration -Monitor neurochecks for now -Continue high-dose thiamine   supplementation -Fall precautions   Frequent falls -Trauma workup in the ED negative for acute findings -Fall precautions recommended -PT evaluation requested -OT evaluation requested   Generalized weakness and adult failure to thrive -Family tried caring for him at home but it has become too difficult for the wife to manage alone -Wife did have home health support but this has not been enough -Patient will need placement in a memory care facility -Wife reports home situation unsafe for patient at this time -Given precipitous decline we will ask for palliative medicine consultation for goals of care   Urinary retention -Monitor closely for recurrence -Foley catheter has been removed -Monitor intake and output closely -Catheterize if unable to void -Resumed home tamsulosin  daily   Hypotension -Reduced metoprolol  from 50 mg to 25 mg -Improved some with IV fluids which have completed  -Added holding parameters to metoprolol  orders   COPD with chronic bronchospasm -Resume home bronchodilators -Scheduled bronchodilators ordered via nebulizer   Rectal bleeding with guaiac positive stool -Likely secondary to hemorrhoids seen on colonoscopy -Has been unable to have a colonoscopy due to poor prep and family recently canceled the outpatient procedure that was scheduled for 09/28/2023.   Type 2 diabetes mellitus -SSI coverage ordered  CBG (last 3)  Recent Labs    09/17/23 2104 09/18/23 0346 09/18/23 0708  GLUCAP 197* 131* 140*    Hypokalemia -additional IV Mg and oral potassium given, recheck in AM   Hypomagnesemia --repleted with IV Mg   DVT prophylaxis: enoxaparin  Code Status: Full  Family Communication: updated wife bedside 1/3 Disposition: anticipate SNF/memory care    Consultants:  PT/OT  Procedures:   Antimicrobials:    Subjective: Remains with pleasant confusion, very hard of hearing; no specific complaints.    Objective: Vitals:   09/18/23 0555  09/18/23 0758 09/18/23 0805 09/18/23 0849  BP: (!) 144/76   (!) 144/61  Pulse: 80   95  Resp: 16     Temp: 98.2 F (36.8 C)     TempSrc:      SpO2: 100% 97% 97%   Weight:      Height:        Intake/Output Summary (Last 24 hours) at 09/18/2023 0921 Last data filed at 09/18/2023 0500 Gross per 24 hour  Intake 240 ml  Output 1550 ml  Net -1310 ml   Filed Weights   09/16/23 0949 09/17/23 0252 09/18/23 0500  Weight: 98.9 kg 98.1 kg 91 kg   Examination:  General exam: Appears calm and comfortable very hard of hearing.  Respiratory system: Clear to auscultation. Respiratory effort normal. Cardiovascular system: normal S1 & S2 heard. No JVD, murmurs, rubs, gallops or clicks. No pedal edema. Gastrointestinal system: Abdomen is nondistended, soft and nontender. No organomegaly or masses felt. Normal bowel sounds heard. Central nervous system: Alert and oriented. No focal neurological deficits. Extremities: Symmetric 5 x 5 power. Skin: No rashes, lesions or ulcers. Psychiatry: Judgement and insight appear normal. Mood & affect appropriate.   Data Reviewed: I have personally reviewed following labs and imaging studies  CBC: Recent Labs  Lab 09/16/23 1020 09/17/23 0405  WBC 12.9* 9.8  NEUTROABS 11.1* 7.6  HGB 10.2* 9.0*  HCT 34.6* 30.8*  MCV 89.4 90.1  PLT 426* 346    Basic Metabolic Panel: Recent Labs  Lab 09/16/23 1020 09/17/23 0405 09/18/23 0429  NA 141 144 139  K 3.3* 3.4* 3.4*  CL 104 107 101  CO2 26 26 28   GLUCOSE 106* 124* 147*  BUN 17 14 11   CREATININE 1.34* 1.00 0.94  CALCIUM  9.5 9.1 8.8*  MG  --  1.3* 1.7    CBG: Recent Labs  Lab 09/17/23 1104 09/17/23 1608 09/17/23 2104 09/18/23 0346 09/18/23 0708  GLUCAP 155* 127* 197* 131* 140*    Recent Results (from the past 240 hours)  Resp panel by RT-PCR (RSV, Flu A&B, Covid) Anterior Nasal Swab     Status: None   Collection Time: 09/16/23 10:18 AM   Specimen: Anterior Nasal Swab  Result Value Ref  Range Status   SARS Coronavirus 2 by RT PCR NEGATIVE NEGATIVE Final    Comment: (NOTE) SARS-CoV-2 target nucleic acids are NOT DETECTED.  The SARS-CoV-2 RNA is generally detectable in upper respiratory specimens during the acute phase of infection. The lowest concentration of SARS-CoV-2 viral copies this assay can detect is 138 copies/mL. A negative result does not preclude SARS-Cov-2 infection and should not be used as the sole basis for treatment or other patient management decisions. A negative result may occur with  improper specimen collection/handling, submission of specimen other than nasopharyngeal swab, presence of viral mutation(s) within the areas targeted by this assay, and inadequate number of viral copies(<138 copies/mL). A negative result must be combined with clinical observations, patient history, and epidemiological information. The expected result is Negative.  Fact Sheet for Patients:  bloggercourse.com  Fact Sheet for Healthcare Providers:  seriousbroker.it  This test is no t yet approved or cleared by the [place]  FDA and  has been authorized for detection and/or diagnosis of SARS-CoV-2 by FDA under an Emergency Use Authorization (EUA). This EUA will remain  in  effect (meaning this test can be used) for the duration of the COVID-19 declaration under Section 564(b)(1) of the Act, 21 U.S.C.section 360bbb-3(b)(1), unless the authorization is terminated  or revoked sooner.       Influenza A by PCR NEGATIVE NEGATIVE Final   Influenza B by PCR NEGATIVE NEGATIVE Final    Comment: (NOTE) The Xpert Xpress SARS-CoV-2/FLU/RSV plus assay is intended as an aid in the diagnosis of influenza from Nasopharyngeal swab specimens and should not be used as a sole basis for treatment. Nasal washings and aspirates are unacceptable for Xpert Xpress SARS-CoV-2/FLU/RSV testing.  Fact Sheet for  Patients: bloggercourse.com  Fact Sheet for Healthcare Providers: seriousbroker.it  This test is not yet approved or cleared by the [place]  FDA and has been authorized for detection and/or diagnosis of SARS-CoV-2 by FDA under an Emergency Use Authorization (EUA). This EUA will remain in effect (meaning this test can be used) for the duration of the COVID-19 declaration under Section 564(b)(1) of the Act, 21 U.S.C. section 360bbb-3(b)(1), unless the authorization is terminated or revoked.     Resp Syncytial Virus by PCR NEGATIVE NEGATIVE Final    Comment: (NOTE) Fact Sheet for Patients: bloggercourse.com  Fact Sheet for Healthcare Providers: seriousbroker.it  This test is not yet approved or cleared by the [place]  FDA and has been authorized for detection and/or diagnosis of SARS-CoV-2 by FDA under an Emergency Use Authorization (EUA). This EUA will remain in effect (meaning this test can be used) for the duration of the COVID-19 declaration under Section 564(b)(1) of the Act, 21 U.S.C. section 360bbb-3(b)(1), unless the authorization is terminated or revoked.  Performed at [facility], [address], [place], [place] [address]   Blood culture (routine x 2)     Status: None (Preliminary result)   Collection Time: 09/16/23 10:20 AM   Specimen: BLOOD  Result Value Ref Range Status   Specimen Description BLOOD BLOOD RIGHT ARM FOREARM  Final   Special Requests   Final    BOTTLES DRAWN AEROBIC AND ANAEROBIC Blood Culture adequate volume   Culture   Final    NO GROWTH 2 DAYS Performed at [facility], [address], [place], [place] [address]    Report Status PENDING  Incomplete  Blood culture (routine x 2)     Status: None (Preliminary result)   Collection Time: 09/16/23 10:20 AM   Specimen: BLOOD  Result Value Ref Range Status   Specimen Description BLOOD BLOOD  RIGHT ARM RAC  Final   Special Requests   Final    BOTTLES DRAWN AEROBIC AND ANAEROBIC Blood Culture adequate volume   Culture   Final    NO GROWTH 2 DAYS Performed at [facility], [address], [place], [place] [address]    Report Status PENDING  Incomplete     Radiology Studies: CT ABDOMEN PELVIS W CONTRAST Result Date: 09/16/2023 CLINICAL DATA:  Status post fall with head strike and altered mental status. EXAM: CT ANGIOGRAPHY CHEST CT ABDOMEN AND PELVIS WITH CONTRAST TECHNIQUE: Multidetector CT imaging of the chest was performed using the standard protocol during bolus administration of intravenous contrast. Multiplanar CT image reconstructions and MIPs were obtained to evaluate the vascular anatomy. Multidetector CT imaging of the abdomen and pelvis was performed using the standard protocol during bolus administration of intravenous contrast. RADIATION DOSE REDUCTION: This exam was performed according to the departmental dose-optimization program which includes automated exposure control, adjustment of the mA and/or kV according to patient size and/or use of  iterative reconstruction technique. CONTRAST:  OMNIPAQUE  IOHEXOL  350 MG/ML SOLN COMPARISON:  Same day chest radiograph FINDINGS: CTA CHEST FINDINGS Cardiovascular: The study is acceptable for the evaluation of pulmonary embolism, although there is motion artifact. There are no filling defects in the central, lobar, segmental or subsegmental pulmonary artery branches to suggest acute pulmonary embolism. Great vessels are normal in course and caliber. Normal heart size. No significant pericardial fluid/thickening. Coronary artery calcifications. Mediastinum/Nodes: Imaged thyroid gland without nodules meeting criteria for imaging follow-up by size. Normal esophagus. No pathologically enlarged axillary, supraclavicular, mediastinal, or hilar lymph nodes. Lungs/Pleura: The central airways are patent. Mild-to-moderate upper lobe predominant  centrilobular emphysema. Moderate diffuse bronchial wall thickening with right lower lobe subsegmental mucous plugging. Bilateral lower lobe subsegmental atelectasis. Irregular medial left upper lobe nodule measures 1.3 x 0.7 cm (6:39). Right apical 3 mm nodule (6:39). No pneumothorax. No pleural effusion. Musculoskeletal: No acute or abnormal lytic or blastic osseous lesions. Multilevel degenerative changes of the thoracic spine. Review of the MIP images confirms the above findings. CT ABDOMEN and PELVIS FINDINGS Hepatobiliary: No focal hepatic lesions. No intra or extrahepatic biliary ductal dilation. Normal gallbladder. Pancreas: No focal lesions or main ductal dilation. Spleen: Normal in size without focal abnormality. Adrenals/Urinary Tract: 1.4 cm left adrenal nodule measures 57 HU. No right adrenal nodule. No suspicious renal mass, calculi, or hydronephrosis. Distended urinary bladder. Punctate focus of intraluminal gas. Stomach/Bowel: Normal appearance of the stomach. No evidence of bowel wall thickening, distention, or inflammatory changes. Colonic diverticulosis without acute diverticulitis. Normal appendix. Vascular/Lymphatic: Aortic atherosclerosis. No enlarged abdominal or pelvic lymph nodes. Reproductive: Heterogeneous enhancement of the prostate gland. Rounded hypoattenuating focus within the right hemi gland measures 1.5 cm (2:79), possibly a BPH nodule. Other: No free fluid, fluid collection, or free air. Musculoskeletal: No acute or abnormal lytic or blastic osseous lesions. Multilevel degenerative changes of the lumbar spine. IMPRESSION: CTA CHEST: 1. No evidence of pulmonary embolism. 2. Irregular medial left upper lobe nodule measures 1.3 x 0.7 cm. Consider one of the following in 3 months for both low-risk and high-risk individuals: (a) repeat chest CT, (b) follow-up PET-CT, or (c) tissue sampling. This recommendation follows the consensus statement: Guidelines for Management of Incidental  Pulmonary Nodules Detected on CT Images: From the Fleischner Society 2017; Radiology 2017; 284:228-243. 3. Moderate diffuse bronchial wall thickening with right lower lobe subsegmental mucous plugging, as can be seen in the setting of bronchitis. CT ABDOMEN AND PELVIS: 1. No acute abnormality in the abdomen or pelvis. 2. Distended urinary bladder with punctate focus of intraluminal gas, which may be related to recent instrumentation. Correlate with urinalysis to exclude cystitis. 3. Heterogeneous enhancement of the prostate gland. Rounded hypoattenuating focus within the right hemi gland measures 1.5 cm, possibly a BPH nodule. Recommend correlation with PSA. 4. Indeterminate 1.4 cm left adrenal nodule. Recommend further evaluation with adrenal protocol CT or MRI. 5. Aortic Atherosclerosis (ICD10-I70.0) and Emphysema (ICD10-J43.9). Coronary artery calcifications. Assessment for potential risk factor modification, dietary therapy or pharmacologic therapy may be warranted, if clinically indicated. Electronically Signed   By: Limin  Xu M.D.   On: 09/16/2023 13:17   CT Angio Chest PE W/Cm &/Or Wo Cm Result Date: 09/16/2023 CLINICAL DATA:  Status post fall with head strike and altered mental status. EXAM: CT ANGIOGRAPHY CHEST CT ABDOMEN AND PELVIS WITH CONTRAST TECHNIQUE: Multidetector CT imaging of the chest was performed using the standard protocol during bolus administration of intravenous contrast. Multiplanar CT image reconstructions and MIPs were obtained  to evaluate the vascular anatomy. Multidetector CT imaging of the abdomen and pelvis was performed using the standard protocol during bolus administration of intravenous contrast. RADIATION DOSE REDUCTION: This exam was performed according to the departmental dose-optimization program which includes automated exposure control, adjustment of the mA and/or kV according to patient size and/or use of iterative reconstruction technique. CONTRAST:  OMNIPAQUE   IOHEXOL  350 MG/ML SOLN COMPARISON:  Same day chest radiograph FINDINGS: CTA CHEST FINDINGS Cardiovascular: The study is acceptable for the evaluation of pulmonary embolism, although there is motion artifact. There are no filling defects in the central, lobar, segmental or subsegmental pulmonary artery branches to suggest acute pulmonary embolism. Great vessels are normal in course and caliber. Normal heart size. No significant pericardial fluid/thickening. Coronary artery calcifications. Mediastinum/Nodes: Imaged thyroid gland without nodules meeting criteria for imaging follow-up by size. Normal esophagus. No pathologically enlarged axillary, supraclavicular, mediastinal, or hilar lymph nodes. Lungs/Pleura: The central airways are patent. Mild-to-moderate upper lobe predominant centrilobular emphysema. Moderate diffuse bronchial wall thickening with right lower lobe subsegmental mucous plugging. Bilateral lower lobe subsegmental atelectasis. Irregular medial left upper lobe nodule measures 1.3 x 0.7 cm (6:39). Right apical 3 mm nodule (6:39). No pneumothorax. No pleural effusion. Musculoskeletal: No acute or abnormal lytic or blastic osseous lesions. Multilevel degenerative changes of the thoracic spine. Review of the MIP images confirms the above findings. CT ABDOMEN and PELVIS FINDINGS Hepatobiliary: No focal hepatic lesions. No intra or extrahepatic biliary ductal dilation. Normal gallbladder. Pancreas: No focal lesions or main ductal dilation. Spleen: Normal in size without focal abnormality. Adrenals/Urinary Tract: 1.4 cm left adrenal nodule measures 57 HU. No right adrenal nodule. No suspicious renal mass, calculi, or hydronephrosis. Distended urinary bladder. Punctate focus of intraluminal gas. Stomach/Bowel: Normal appearance of the stomach. No evidence of bowel wall thickening, distention, or inflammatory changes. Colonic diverticulosis without acute diverticulitis. Normal appendix. Vascular/Lymphatic:  Aortic atherosclerosis. No enlarged abdominal or pelvic lymph nodes. Reproductive: Heterogeneous enhancement of the prostate gland. Rounded hypoattenuating focus within the right hemi gland measures 1.5 cm (2:79), possibly a BPH nodule. Other: No free fluid, fluid collection, or free air. Musculoskeletal: No acute or abnormal lytic or blastic osseous lesions. Multilevel degenerative changes of the lumbar spine. IMPRESSION: CTA CHEST: 1. No evidence of pulmonary embolism. 2. Irregular medial left upper lobe nodule measures 1.3 x 0.7 cm. Consider one of the following in 3 months for both low-risk and high-risk individuals: (a) repeat chest CT, (b) follow-up PET-CT, or (c) tissue sampling. This recommendation follows the consensus statement: Guidelines for Management of Incidental Pulmonary Nodules Detected on CT Images: From the Fleischner Society 2017; Radiology 2017; 284:228-243. 3. Moderate diffuse bronchial wall thickening with right lower lobe subsegmental mucous plugging, as can be seen in the setting of bronchitis. CT ABDOMEN AND PELVIS: 1. No acute abnormality in the abdomen or pelvis. 2. Distended urinary bladder with punctate focus of intraluminal gas, which may be related to recent instrumentation. Correlate with urinalysis to exclude cystitis. 3. Heterogeneous enhancement of the prostate gland. Rounded hypoattenuating focus within the right hemi gland measures 1.5 cm, possibly a BPH nodule. Recommend correlation with PSA. 4. Indeterminate 1.4 cm left adrenal nodule. Recommend further evaluation with adrenal protocol CT or MRI. 5. Aortic Atherosclerosis (ICD10-I70.0) and Emphysema (ICD10-J43.9). Coronary artery calcifications. Assessment for potential risk factor modification, dietary therapy or pharmacologic therapy may be warranted, if clinically indicated. Electronically Signed   By: Limin  Xu M.D.   On: 09/16/2023 13:17   CT Head Wo Contrast Result  Date: 09/16/2023 CLINICAL DATA:  Head trauma, minor  (Age >= 65y); Neck trauma (Age >= 65y) EXAM: CT HEAD WITHOUT CONTRAST CT CERVICAL SPINE WITHOUT CONTRAST TECHNIQUE: Multidetector CT imaging of the head and cervical spine was performed following the standard protocol without intravenous contrast. Multiplanar CT image reconstructions of the cervical spine were also generated. RADIATION DOSE REDUCTION: This exam was performed according to the departmental dose-optimization program which includes automated exposure control, adjustment of the mA and/or kV according to patient size and/or use of iterative reconstruction technique. COMPARISON:  None Available. FINDINGS: CT HEAD FINDINGS Brain: No hemorrhage. No hydrocephalus. No extra-axial fluid collection. No CT evidence of an acute cortical infarct. No mass effect. No mass lesion. Vascular: No hyperdense vessel or unexpected calcification. Skull: Normal. Negative for fracture or focal lesion. Sinuses/Orbits: No middle ear or mastoid effusion. Paranasal sinuses notable for mild mucosal thickening in the left maxillary sinus. Orbits are unremarkable. Other: None. CT CERVICAL SPINE FINDINGS Alignment: Normal. Skull base and vertebrae: No acute fracture. No primary bone lesion or focal pathologic process. Soft tissues and spinal canal: No prevertebral fluid or swelling. No visible canal hematoma. Disc levels:  No CT evidence of high-grade spinal canal stenosis Upper chest: Negative. Other: None IMPRESSION: 1. No CT evidence of intracranial injury. 2. No acute fracture or traumatic subluxation of the cervical spine. Electronically Signed   By: Lyndall Gore M.D.   On: 09/16/2023 13:03   CT Cervical Spine Wo Contrast Result Date: 09/16/2023 CLINICAL DATA:  Head trauma, minor (Age >= 65y); Neck trauma (Age >= 65y) EXAM: CT HEAD WITHOUT CONTRAST CT CERVICAL SPINE WITHOUT CONTRAST TECHNIQUE: Multidetector CT imaging of the head and cervical spine was performed following the standard protocol without intravenous contrast.  Multiplanar CT image reconstructions of the cervical spine were also generated. RADIATION DOSE REDUCTION: This exam was performed according to the departmental dose-optimization program which includes automated exposure control, adjustment of the mA and/or kV according to patient size and/or use of iterative reconstruction technique. COMPARISON:  None Available. FINDINGS: CT HEAD FINDINGS Brain: No hemorrhage. No hydrocephalus. No extra-axial fluid collection. No CT evidence of an acute cortical infarct. No mass effect. No mass lesion. Vascular: No hyperdense vessel or unexpected calcification. Skull: Normal. Negative for fracture or focal lesion. Sinuses/Orbits: No middle ear or mastoid effusion. Paranasal sinuses notable for mild mucosal thickening in the left maxillary sinus. Orbits are unremarkable. Other: None. CT CERVICAL SPINE FINDINGS Alignment: Normal. Skull base and vertebrae: No acute fracture. No primary bone lesion or focal pathologic process. Soft tissues and spinal canal: No prevertebral fluid or swelling. No visible canal hematoma. Disc levels:  No CT evidence of high-grade spinal canal stenosis Upper chest: Negative. Other: None IMPRESSION: 1. No CT evidence of intracranial injury. 2. No acute fracture or traumatic subluxation of the cervical spine. Electronically Signed   By: Lyndall Gore M.D.   On: 09/16/2023 13:03   DG Chest Portable 1 View Result Date: 09/16/2023 CLINICAL DATA:  Fall. EXAM: PORTABLE CHEST 1 VIEW COMPARISON:  09/02/2023. FINDINGS: Bilateral lung fields are clear. Bilateral costophrenic angles are clear. Normal cardio-mediastinal silhouette. No acute osseous abnormalities. The soft tissues are within normal limits. IMPRESSION: No active disease. Electronically Signed   By: Ree Molt M.D.   On: 09/16/2023 11:29   DG Pelvis Portable Result Date: 09/16/2023 CLINICAL DATA:  Fall. EXAM: PORTABLE PELVIS 1-2 VIEWS COMPARISON:  None Available. FINDINGS: Pelvis is intact with  normal and symmetric sacroiliac joints. No acute fracture or dislocation.  No aggressive osseous lesion. Visualized sacral arcuate lines are unremarkable. Unremarkable symphysis pubis. There are mild degenerative changes of bilateral hip joints without significant joint space narrowing. Osteophytosis of the superior acetabulum. No radiopaque foreign bodies. IMPRESSION: *No acute osseous abnormality of the pelvis. Electronically Signed   By: Ree Molt M.D.   On: 09/16/2023 11:26    Scheduled Meds:  atorvastatin   80 mg Oral q1800   cyanocobalamin   1,000 mcg Oral Daily   docusate sodium   100 mg Oral BID   enoxaparin  (LOVENOX ) injection  40 mg Subcutaneous Q24H   fluticasone  furoate-vilanterol  1 puff Inhalation Daily   furosemide   40 mg Oral Daily   guaiFENesin   600 mg Oral BID   insulin  aspart  0-15 Units Subcutaneous TID WC   ipratropium-albuterol   3 mL Nebulization TID   metoprolol  tartrate  25 mg Oral BID   mirtazapine   15 mg Oral QHS   multivitamin with minerals  1 tablet Oral Daily   naltrexone   50 mg Oral Daily   omega-3 acid ethyl esters  1,000 mg Oral Daily   pantoprazole   40 mg Oral Daily   potassium chloride  SA  20 mEq Oral BID   risperiDONE   1 mg Oral QHS   tamsulosin   0.4 mg Oral Daily   thiamine   250 mg Oral Daily   Continuous Infusions:  magnesium  sulfate bolus IVPB 2 g (09/18/23 0847)     LOS: 2 days   Time spent: 50 mins  Konnar Ben Vicci, MD How to contact the TRH Attending or Consulting provider 7A - 7P or covering provider during after hours 7P -7A, for this patient?  Check the care team in [facility] and look for a) attending/consulting TRH provider listed and b) the TRH team listed Log into [email or web address] to find provider on call.  Locate the TRH provider you are looking for under [facility] and page to a number that you can be directly reached. If you still have difficulty reaching the provider, please page the [facility] (Director on Call) for the Hospitalists  listed on amion for assistance.  09/18/2023, 9:21 AM

## 2023-09-18 NOTE — TOC Progression Note (Signed)
 Transition of Care [facility]) - Progression Note    Patient Details  Name: Jeff Miles MRN: [id number] Date of Birth: 1950-09-28  Transition of Care [facility]) CM/SW Contact  Rollo Petri, LCSW Phone Number: 09/18/2023, 3:19 PM  Clinical Narrative:     TOC following. PT recommending SNF rehab at dc. Spoke with pt's wife to review. She is in agreement. CMS provider options reviewed and will refer as requested.  Insurance auth request started with [facility].  TOC will follow.  Expected Discharge Plan: Skilled Nursing Facility Barriers to Discharge: Continued Medical Work up  Expected Discharge Plan and Services In-house Referral: Clinical Social Work   Post Acute Care Choice: Skilled Nursing Facility Living arrangements for the past 2 months: Single Family Home                                       Social Determinants of Health (SDOH) Interventions SDOH Screenings   Food Insecurity: No Food Insecurity (09/16/2023)  Housing: Low Risk  (09/16/2023)  Transportation Needs: No Transportation Needs (09/16/2023)  Utilities: Not At Risk (09/16/2023)  Social Connections: Patient Unable To Answer (09/16/2023)  Tobacco Use: Medium Risk (09/16/2023)    Readmission Risk Interventions    09/17/2023    4:45 PM  Readmission Risk Prevention Plan  Transportation Screening Complete  PCP or Specialist Appt within 3-5 Days Complete  HRI or Home Care Consult Complete  Social Work Consult for Recovery Care Planning/Counseling Complete  Palliative Care Screening Not Applicable  Medication Review Oceanographer) Complete

## 2023-09-19 ENCOUNTER — Encounter: Payer: Self-pay | Admitting: Internal Medicine

## 2023-09-19 LAB — GLUCOSE, CAPILLARY
Glucose-Capillary: 154 mg/dL — ABNORMAL HIGH (ref 70–99)
Glucose-Capillary: 159 mg/dL — ABNORMAL HIGH (ref 70–99)
Glucose-Capillary: 149 mg/dL — ABNORMAL HIGH (ref 70–99)
Glucose-Capillary: 168 mg/dL — ABNORMAL HIGH (ref 70–99)
Glucose-Capillary: 176 mg/dL — ABNORMAL HIGH (ref 70–99)

## 2023-09-19 LAB — MAGNESIUM: Magnesium: 1.6 mg/dL — ABNORMAL LOW (ref 1.7–2.4)

## 2023-09-19 LAB — BASIC METABOLIC PANEL
Anion gap: 9 (ref 5–15)
BUN: 10 mg/dL (ref 8–23)
CO2: 26 mmol/L (ref 22–32)
Calcium: 9 mg/dL (ref 8.9–10.3)
Chloride: 102 mmol/L (ref 98–111)
Creatinine, Ser: 1.05 mg/dL (ref 0.61–1.24)
GFR, Estimated: >60 mL/min (ref >60)
Glucose, Bld: 152 mg/dL — ABNORMAL HIGH (ref 70–99)
Potassium: 3.6 mmol/L (ref 3.5–5.1)
Sodium: 137 mmol/L (ref 135–145)

## 2023-09-19 MED ORDER — MAGNESIUM SULFATE 4 GM/100ML IV SOLN
4.0000 g | Freq: Once | INTRAVENOUS | Status: AC
  Administered 2023-09-19: 4 g via INTRAVENOUS
  Filled 2023-09-19: qty 100

## 2023-09-19 NOTE — Progress Notes (Signed)
 PROGRESS NOTE   Jeff Miles  FMW:[id number] DOB: 10-27-1950 DOA: 09/16/2023 PCP: [name], MD   Chief Complaint  Patient presents with   Altered Mental Status   Level of care: Med-Surg  Brief Admission History:  73 year old male with advanced dementia associated with history of alcohol  abuse and Korsakoff's encephalopathy, hypertension, COPD, former longtime smoker, iron  deficiency anemia, type 2 diabetes mellitus, GAD, GERD, BPH with urinary retention, failure to thrive, frequent falls has had several recent hospitalizations most recently discharged on 09/03/2023 after being hospitalized for urinary retention, acute on chronic respiratory failure and metabolic encephalopathy and he was discharged home with home health services with his wife as primary caretaker.  She reports that for the past several days he has had increasing confusion and wandering at night and falling multiple times at home.  He had canceled his outpatient appointment to have a colonoscopy on 09/28/2023.  This was part of a workup for guaiac positive stools and recent colonoscopy was incomplete due to poor prep.  Wife reports that it has been unsafe for him to remain at home because she is unable to care for him due to his frequent falls and nighttime wandering behavior.  She reports that he fell out of a chair this morning and struck his head on the floor.  He was discharged with a Foley catheter but has since been removed and he has been voiding per wife.  She reports that she is unable to care for him at home in his current state.  His ED trauma workup has been negative for acute findings.  He is being admitted for frequent falls and encephalopathy.   Assessment and Plan:  Acute metabolic encephalopathy - RESOLVED  -Likely underlying advanced dementia -No acute metabolic abnormalities found -Treated with IV fluids in the ED for mild dehydration -Monitor neurochecks for now -Continue high-dose thiamine   supplementation -Fall precautions recommended    Frequent falls -Trauma workup in the ED negative for acute findings -Fall precautions recommended -PT evaluation requested -- SNF recommended  -OT evaluation requested -- SNF recommended    Generalized weakness and adult failure to thrive -Family tried caring for him at home but it has become too difficult for the wife to manage alone -Wife did have home health support but this has not been enough -Patient will need placement in a memory care facility -Wife reports home situation unsafe for patient at this time -Given precipitous decline we will ask for palliative medicine consultation for goals of care   Urinary retention -Monitor closely for recurrence -Foley catheter has been removed -Monitor intake and output closely -Catheterize if unable to void -Resumed home tamsulosin  daily   Hypotension - RESOLVED  -Reduced metoprolol  from 50 mg to 25 mg with good results  -Improved some with IV fluids which have completed  -Added holding parameters to metoprolol  orders   COPD with chronic bronchospasm -Resume home bronchodilators -Scheduled bronchodilators ordered via nebulizer   Rectal bleeding with guaiac positive stool -Likely secondary to hemorrhoids seen on colonoscopy -Has been unable to have a colonoscopy due to poor prep and family recently canceled the outpatient procedure that was scheduled for 09/28/2023.   Type 2 diabetes mellitus -SSI coverage ordered  CBG (last 3)  Recent Labs    09/19/23 0322 09/19/23 0724 09/19/23 1119  GLUCAP 154* 159* 149*    Hypokalemia -additional IV Mg and oral potassium given, recheck in AM   Hypomagnesemia --repleted with IV Mg   DVT prophylaxis: enoxaparin  Code Status:  Full  Family Communication: updated wife bedside 1/3, telephone 1/6 Disposition: anticipate SNF/memory care    Consultants:  PT/OT   Procedures:   Antimicrobials:    Subjective: Pleasant and cooperative,  eating breakfast, no specific complaints.     Objective: Vitals:   09/19/23 0817 09/19/23 1151 09/19/23 1437 09/19/23 1438  BP:  128/69  136/75  Pulse:  84  96  Resp:  19  18  Temp:      TempSrc:      SpO2: 100% 98% 100% 100%  Weight:      Height:       No intake or output data in the 24 hours ending 09/19/23 1606  Filed Weights   09/17/23 0252 09/18/23 0500 09/19/23 0324  Weight: 98.1 kg 91 kg 91 kg   Examination:  General exam: Appears calm and comfortable very hard of hearing.  Respiratory system: Clear to auscultation. Respiratory effort normal. Cardiovascular system: normal S1 & S2 heard. No JVD, murmurs, rubs, gallops or clicks. No pedal edema. Gastrointestinal system: Abdomen is nondistended, soft and nontender. No organomegaly or masses felt. Normal bowel sounds heard. Central nervous system: Alert and oriented. No focal neurological deficits. Extremities: Symmetric 5 x 5 power. Skin: No rashes, lesions or ulcers. Psychiatry: Judgement and insight appear normal. Mood & affect appropriate.   Data Reviewed: I have personally reviewed following labs and imaging studies  CBC: Recent Labs  Lab 09/16/23 1020 09/17/23 0405  WBC 12.9* 9.8  NEUTROABS 11.1* 7.6  HGB 10.2* 9.0*  HCT 34.6* 30.8*  MCV 89.4 90.1  PLT 426* 346    Basic Metabolic Panel: Recent Labs  Lab 09/16/23 1020 09/17/23 0405 09/18/23 0429 09/19/23 0504  NA 141 144 139 137  K 3.3* 3.4* 3.4* 3.6  CL 104 107 101 102  CO2 26 26 28 26   GLUCOSE 106* 124* 147* 152*  BUN 17 14 11 10   CREATININE 1.34* 1.00 0.94 1.05  CALCIUM  9.5 9.1 8.8* 9.0  MG  --  1.3* 1.7 1.6*    CBG: Recent Labs  Lab 09/18/23 1634 09/18/23 2113 09/19/23 0322 09/19/23 0724 09/19/23 1119  GLUCAP 151* 155* 154* 159* 149*    Recent Results (from the past 240 hours)  Resp panel by RT-PCR (RSV, Flu A&B, Covid) Anterior Nasal Swab     Status: None   Collection Time: 09/16/23 10:18 AM   Specimen: Anterior Nasal Swab   Result Value Ref Range Status   SARS Coronavirus 2 by RT PCR NEGATIVE NEGATIVE Final    Comment: (NOTE) SARS-CoV-2 target nucleic acids are NOT DETECTED.  The SARS-CoV-2 RNA is generally detectable in upper respiratory specimens during the acute phase of infection. The lowest concentration of SARS-CoV-2 viral copies this assay can detect is 138 copies/mL. A negative result does not preclude SARS-Cov-2 infection and should not be used as the sole basis for treatment or other patient management decisions. A negative result may occur with  improper specimen collection/handling, submission of specimen other than nasopharyngeal swab, presence of viral mutation(s) within the areas targeted by this assay, and inadequate number of viral copies(<138 copies/mL). A negative result must be combined with clinical observations, patient history, and epidemiological information. The expected result is Negative.  Fact Sheet for Patients:  bloggercourse.com  Fact Sheet for Healthcare Providers:  seriousbroker.it  This test is no t yet approved or cleared by the [place]  FDA and  has been authorized for detection and/or diagnosis of SARS-CoV-2 by FDA under an Emergency Use Authorization (EUA).  This EUA will remain  in effect (meaning this test can be used) for the duration of the COVID-19 declaration under Section 564(b)(1) of the Act, 21 U.S.C.section 360bbb-3(b)(1), unless the authorization is terminated  or revoked sooner.       Influenza A by PCR NEGATIVE NEGATIVE Final   Influenza B by PCR NEGATIVE NEGATIVE Final    Comment: (NOTE) The Xpert Xpress SARS-CoV-2/FLU/RSV plus assay is intended as an aid in the diagnosis of influenza from Nasopharyngeal swab specimens and should not be used as a sole basis for treatment. Nasal washings and aspirates are unacceptable for Xpert Xpress SARS-CoV-2/FLU/RSV testing.  Fact Sheet for  Patients: bloggercourse.com  Fact Sheet for Healthcare Providers: seriousbroker.it  This test is not yet approved or cleared by the [place]  FDA and has been authorized for detection and/or diagnosis of SARS-CoV-2 by FDA under an Emergency Use Authorization (EUA). This EUA will remain in effect (meaning this test can be used) for the duration of the COVID-19 declaration under Section 564(b)(1) of the Act, 21 U.S.C. section 360bbb-3(b)(1), unless the authorization is terminated or revoked.     Resp Syncytial Virus by PCR NEGATIVE NEGATIVE Final    Comment: (NOTE) Fact Sheet for Patients: bloggercourse.com  Fact Sheet for Healthcare Providers: seriousbroker.it  This test is not yet approved or cleared by the [place]  FDA and has been authorized for detection and/or diagnosis of SARS-CoV-2 by FDA under an Emergency Use Authorization (EUA). This EUA will remain in effect (meaning this test can be used) for the duration of the COVID-19 declaration under Section 564(b)(1) of the Act, 21 U.S.C. section 360bbb-3(b)(1), unless the authorization is terminated or revoked.  Performed at [facility], [address] [place], [place] [address]   Blood culture (routine x 2)     Status: None (Preliminary result)   Collection Time: 09/16/23 10:20 AM   Specimen: BLOOD  Result Value Ref Range Status   Specimen Description BLOOD BLOOD RIGHT ARM FOREARM  Final   Special Requests   Final    BOTTLES DRAWN AEROBIC AND ANAEROBIC Blood Culture adequate volume   Culture   Final    NO GROWTH 3 DAYS Performed at [facility], [address], [place], [place] [address]    Report Status PENDING  Incomplete  Blood culture (routine x 2)     Status: None (Preliminary result)   Collection Time: 09/16/23 10:20 AM   Specimen: BLOOD  Result Value Ref Range Status   Specimen Description BLOOD BLOOD  RIGHT ARM RAC  Final   Special Requests   Final    BOTTLES DRAWN AEROBIC AND ANAEROBIC Blood Culture adequate volume   Culture   Final    NO GROWTH 3 DAYS Performed at [facility], [address], [place], [place] [address]    Report Status PENDING  Incomplete     Radiology Studies: No results found.   Scheduled Meds:  atorvastatin   80 mg Oral q1800   cyanocobalamin   1,000 mcg Oral Daily   docusate sodium   100 mg Oral BID   enoxaparin  (LOVENOX ) injection  40 mg Subcutaneous Q24H   fluticasone  furoate-vilanterol  1 puff Inhalation Daily   furosemide   40 mg Oral Daily   guaiFENesin   600 mg Oral BID   insulin  aspart  0-15 Units Subcutaneous TID WC   ipratropium-albuterol   3 mL Nebulization TID   metoprolol  tartrate  25 mg Oral BID   mirtazapine   15 mg Oral QHS   multivitamin with minerals  1  tablet Oral Daily   naltrexone   50 mg Oral Daily   omega-3 acid ethyl esters  1,000 mg Oral Daily   pantoprazole   40 mg Oral Daily   potassium chloride  SA  20 mEq Oral BID   risperiDONE   1 mg Oral QHS   tamsulosin   0.4 mg Oral Daily   thiamine   250 mg Oral Daily   Continuous Infusions:     LOS: 3 days   Time spent: 43 mins  Avianna Moynahan Vicci, MD How to contact the [facility] Attending or Consulting provider 7A - 7P or covering provider during after hours 7P -7A, for this patient?  Check the care team in [facility] and look for a) attending/consulting TRH provider listed and b) the TRH team listed Log into [email or web address] to find provider on call.  Locate the TRH provider you are looking for under [facility] and page to a number that you can be directly reached. If you still have difficulty reaching the provider, please page the [facility] (Director on Call) for the Hospitalists listed on amion for assistance.  09/19/2023, 4:06 PM

## 2023-09-19 NOTE — TOC Progression Note (Signed)
 Transition of Care [facility]) - Progression Note    Patient Details  Name: Jeff Miles MRN: [id number] Date of Birth: 1951-09-10  Transition [facility]) CM/SW Contact  Rollo Petri, LCSW Phone Number: 09/19/2023, 2:36 PM  Clinical Narrative:     TOC following. [place] SNFs with memory care are not able to offer on patient. Spoke with pt's wife to update. She [place] agreeable to [place].   Expanded search and will follow up when facilities respond.  Expected Discharge Plan: Skilled Nursing Facility Barriers to Discharge: Continued Medical Work up  Expected Discharge Plan and Services In-house Referral: Clinical Social Work   Post Acute Care Choice: Skilled Nursing Facility Living arrangements for the past 2 months: Single Family Home                                       Social Determinants of Health (SDOH) Interventions SDOH Screenings   Food Insecurity: No Food Insecurity (09/16/2023)  Housing: Low Risk  (09/16/2023)  Transportation Needs: No Transportation Needs (09/16/2023)  Utilities: Not At Risk (09/16/2023)  Social Connections: Patient Unable To Answer (09/16/2023)  Tobacco Use: Medium Risk (09/16/2023)    Readmission Risk Interventions    09/17/2023    4:45 PM  Readmission Risk Prevention Plan  Transportation Screening Complete  PCP or Specialist Appt within 3-5 Days Complete  HRI or Home Care Consult Complete  Social Work Consult for Recovery Care Planning/Counseling Complete  Palliative Care Screening Not Applicable  Medication Review Oceanographer) Complete

## 2023-09-19 NOTE — Plan of Care (Signed)
  Problem: Acute Rehab OT Goals (only OT should resolve) Goal: Pt. Will Perform Grooming Flowsheets (Taken 09/19/2023 0947) Pt Will Perform Grooming:  with supervision  standing Goal: Pt. Will Perform Lower Body Dressing Flowsheets (Taken 09/19/2023 0947) Pt Will Perform Lower Body Dressing: with modified independence Goal: Pt. Will Transfer To Toilet Flowsheets (Taken 09/19/2023 [phone]) Pt Will Transfer to Toilet:  with modified independence  ambulating  stand pivot transfer Goal: Pt. Will Perform Toileting-Clothing Manipulation Flowsheets (Taken 09/19/2023 0947) Pt Will Perform Toileting - Clothing Manipulation and hygiene:  with modified independence  sitting/lateral leans Goal: Pt/Caregiver Will Perform Home Exercise Program Flowsheets (Taken 09/19/2023 [phone]) Pt/caregiver will Perform Home Exercise Program:  Increased strength  Both right and left upper extremity  Independently  Makayleigh Poliquin OT, MOT

## 2023-09-19 NOTE — Evaluation (Signed)
 Occupational Therapy Evaluation Patient Details Name: Jeff Miles MRN: [id number] DOB: August 07, 1951 Today's Date: 09/19/2023   History of Present Illness 73 year old male with advanced dementia associated with history of alcohol  abuse and Korsakoff's encephalopathy, hypertension, COPD, former longtime smoker, iron  deficiency anemia, type 2 diabetes mellitus, GAD, GERD, BPH with urinary retention, failure to thrive, frequent falls has had several recent hospitalizations most recently discharged on 09/03/2023 after being hospitalized for urinary retention, acute on chronic respiratory failure and metabolic encephalopathy and he was discharged home with home health services with his wife as primary caretaker.  She reports that for the past several days he has had increasing confusion and wandering at night and falling multiple times at home.  He had canceled his outpatient appointment to have a colonoscopy on 09/28/2023.  This was part of a workup for guaiac positive stools and recent colonoscopy was incomplete due to poor prep.  Wife reports that it has been unsafe for him to remain at home because she is unable to care for him due to his frequent falls and nighttime wandering behavior.  She reports that he fell out of a chair this morning and struck his head on the floor.  He was discharged with a Foley catheter but has since been removed and he has been voiding per wife.  She reports that she is unable to care for him at home in his current state.  His ED trauma workup has been negative for acute findings.  He is being admitted for frequent falls and encephalopathy.   Clinical Impression   Pt agreeable to OT evaluation. Pt is a poor historian but reported use of RW at home and independent ADL's. Able to complete bed mobility with mod A with HOB raised. Mod A needed for transfer to chair with RW. Pt donned 3 L supplemental O2 during session. Pt is generally weak with good ability to complete seated ADL's  but is very unsteady in standing and for transfers.  Pt will benefit from continued OT in the hospital and recommended venue below to increase strength, balance, and endurance for safe ADL's.         If plan is discharge home, recommend the following: A lot of help with walking and/or transfers;A little help with bathing/dressing/bathroom;Assistance with cooking/housework;Assist for transportation;Help with stairs or ramp for entrance;Direct supervision/assist for medications management    Functional Status Assessment  Patient has had a recent decline in their functional status and demonstrates the ability to make significant improvements in function in a reasonable and predictable amount of time.  Equipment Recommendations  None recommended by OT           Precautions / Restrictions Precautions Precautions: Fall Restrictions Weight Bearing Restrictions Per Provider Order: No      Mobility Bed Mobility Overal bed mobility: Needs Assistance Bed Mobility: Supine to Sit     Supine to sit: Mod assist, HOB elevated     General bed mobility comments: HOB highly elevated; assist to move  B LE to edge of bed and pull to sit.    Transfers Overall transfer level: Needs assistance Equipment used: Rolling walker (2 wheels) Transfers: Sit to/from Stand, Bed to chair/wheelchair/BSC Sit to Stand: Mod assist     Step pivot transfers: Mod assist     General transfer comment: labored; unsteady; assist to manage RW; much cuing      Balance Overall balance assessment: Needs assistance Sitting-balance support: Feet supported, No upper extremity supported Sitting balance-Leahy Scale: Fair Sitting balance -  Comments: fair/good seated at EOB   Standing balance support: During functional activity, Bilateral upper extremity supported Standing balance-Leahy Scale: Poor Standing balance comment: poor with RW                           ADL either performed or assessed with  clinical judgement   ADL Overall ADL's : Needs assistance/impaired     Grooming: Set up;Sitting;Contact guard assist   Upper Body Bathing: Set up;Contact guard assist;Sitting   Lower Body Bathing: Contact guard assist;Set up;Sitting/lateral leans   Upper Body Dressing : Set up;Contact guard assist;Sitting   Lower Body Dressing: Set up;Contact guard assist;Sitting/lateral leans   Toilet Transfer: Moderate assistance;Rolling walker (2 wheels);Stand-pivot Statistician Details (indicate cue type and reason): Simulated via EOB to chair with RW Toileting- Clothing Manipulation and Hygiene: Moderate assistance;Sitting/lateral lean       Functional mobility during ADLs: Moderate assistance;Rolling walker (2 wheels)       Vision Baseline Vision/History: 1 Wears glasses Ability to See in Adequate Light: 1 Impaired Patient Visual Report: No change from baseline Vision Assessment?: No apparent visual deficits     Perception Perception: Not tested       Praxis Praxis: Not tested       Pertinent Vitals/Pain Pain Assessment Pain Assessment: Faces Faces Pain Scale: No hurt     Extremity/Trunk Assessment Upper Extremity Assessment Upper Extremity Assessment: Generalized weakness   Lower Extremity Assessment Lower Extremity Assessment: Defer to PT evaluation   Cervical / Trunk Assessment Cervical / Trunk Assessment: Kyphotic   Communication Communication Communication: Hearing impairment   Cognition Arousal: Alert Behavior During Therapy: WFL for tasks assessed/performed Overall Cognitive Status: History of cognitive impairments - at baseline                                                        Home Living Family/patient expects to be discharged to:: Private residence Living Arrangements: Spouse/significant other Available Help at Discharge: Family;Available 24 hours/day Type of Home: House Home Access: Stairs to enter [employer] of Steps: 1 Entrance Stairs-Rails: None Home Layout: One level     Bathroom Shower/Tub: Chief Strategy Officer: Standard Bathroom Accessibility: Yes How Accessible: Accessible via wheelchair;Accessible via walker     Additional Comments: per chart review; pt is a poor historian.      Prior Functioning/Environment Prior Level of Function : Needs assist       Physical Assist : Mobility (physical);ADLs (physical) Mobility (physical): Bed mobility;Transfers;Gait;Stairs   Mobility Comments: Pt reported use of RW today. Pt seems to be a poor historian. ADLs Comments: Pt reports independent ADL's        OT Problem List: Decreased activity tolerance;Impaired balance (sitting and/or standing);Decreased strength      OT Treatment/Interventions: Self-care/ADL training;Therapeutic exercise;Therapeutic activities;Patient/family education;Balance training    OT Goals(Current goals can be found in the care plan section) Acute Rehab OT Goals Patient Stated Goal: improve strength OT Goal Formulation: With patient Time For Goal Achievement: 10/03/23 Potential to Achieve Goals: Good  OT Frequency: Min 2X/week                                   End of Session Equipment  Utilized During Treatment: Rolling walker (2 wheels);Gait belt;Oxygen Nurse Communication: Other (comment) (notified pt was in the chair)  Activity Tolerance: Patient tolerated treatment well Patient left: in chair;with call bell/phone within reach;with chair alarm set  OT Visit Diagnosis: Unsteadiness on feet (R26.81);Other abnormalities of gait and mobility (R26.89);Muscle weakness (generalized) (M62.81);History of falling (Z91.81);Adult, failure to thrive (R62.7)                Time: 9149-9087 OT Time Calculation (min): 22 min Charges:  OT General Charges $OT Visit: 1 Visit OT Evaluation $OT Eval Low Complexity: 1 Low  Jeff Miles OT, MOT  Jeff Miles 09/19/2023, 9:45 AM

## 2023-09-19 NOTE — Plan of Care (Signed)

## 2023-09-19 NOTE — Progress Notes (Signed)
   09/19/23 0810  Assess: MEWS Score  Temp 98.7 F (37.1 C)  BP (!) 124/58  MAP (mmHg) 76  Pulse Rate 98  Resp 18  SpO2 99 %  O2 Device Nasal Cannula  O2 Flow Rate (L/min) 3 L/min  Assess: MEWS Score  MEWS Temp 0  MEWS Systolic 0  MEWS Pulse 0  MEWS RR 0  MEWS LOC 0  MEWS Score 0  MEWS Score Color Green  Assess: SIRS CRITERIA  SIRS Temperature  0  SIRS Respirations  0  SIRS Pulse 1  SIRS WBC 0  SIRS Score Sum  1

## 2023-09-20 LAB — GLUCOSE, CAPILLARY
Glucose-Capillary: 162 mg/dL — ABNORMAL HIGH (ref 70–99)
Glucose-Capillary: 155 mg/dL — ABNORMAL HIGH (ref 70–99)
Glucose-Capillary: 151 mg/dL — ABNORMAL HIGH (ref 70–99)
Glucose-Capillary: 142 mg/dL — ABNORMAL HIGH (ref 70–99)
Glucose-Capillary: 176 mg/dL — ABNORMAL HIGH (ref 70–99)

## 2023-09-20 NOTE — Plan of Care (Signed)
   Problem: Education: Goal: Knowledge of General Education information will improve Description Including pain rating scale, medication(s)/side effects and non-pharmacologic comfort measures Outcome: Progressing   Problem: Education: Goal: Knowledge of General Education information will improve Description Including pain rating scale, medication(s)/side effects and non-pharmacologic comfort measures Outcome: Progressing

## 2023-09-20 NOTE — Progress Notes (Signed)
     Referral received for Jeff Miles for goals of care discussion. Chart reviewed and updates received from RN. Patient assessed and is unable to engage appropriately in discussions.   I was able to speak with the patient's wife Jeff Miles, although she was limited in time available on the phone. GOC meeting scheduled for tomorrow 09/20/2022 @ 10:00 am. Family is aware we will meet at patient's bedside.   Full consult note to follow GOC conversation tomorrow.  Thank you for your referral and allowing PMT to assist in Jeff Miles's care.   Camellia Kays, NP Palliative Medicine Team Phone: [phone]  NO CHARGE

## 2023-09-20 NOTE — Progress Notes (Signed)
 Mobility Specialist Progress Note:    09/20/23 1100  Mobility  Activity Dangled on edge of bed  Level of Assistance Maximum assist, patient does 25-49%  Assistive Device None  Range of Motion/Exercises Active Assistive;All extremities  Activity Response Tolerated well  Mobility Referral Yes  Mobility visit 1 Mobility  Mobility Specialist Start Time (ACUTE ONLY) 1040  Mobility Specialist Stop Time (ACUTE ONLY) 1100  Mobility Specialist Time Calculation (min) (ACUTE ONLY) 20 min   Pt received in bed, wife and NT in room. Agreeable to mobility, required MaxA to scoot to EOB. Tolerated well, pt confused and requires many verbal cues during session. Returned supine, alarm on. All needs met.   Sherrilee Ditty Mobility Specialist Please contact via Special Educational Needs Teacher or  Rehab office at [phone]

## 2023-09-20 NOTE — TOC Progression Note (Addendum)
 Transition of Care [facility]) - Progression Note    Patient Details  Name: Jeff Miles MRN: [id number] Date of Birth: 1951-03-21  Transition of Care [facility]) CM/SW Contact  [place]arth[place] Saddie Kim, [place] Phone Number: 09/20/2023, 12:45 PM  Clinical Narrative:  MD notified SNF auth went to peer-to-peer. Per MD, peer-to-peer completed and insurance denies SNF. Pt's wife updated. She states pt cannot return home as it is not safe for him and he will need memory care. Discussed applying for Medicaid with pt's wife and she is on her way to DSS now. LCSW spoke with admissions at [facility]- no beds available. LCSW also spoke with admissions at [facility] in [place] and they have bed available on memory care, but will need face-to-face assessment by clinical liaison, Madelin Jacobus. Per Tammy, she can assess pt on Thursday. Wife updated. Wife is aware private pay rate is 9300 until Medicaid approved. Also discussed possible ALF memory care unit. Wife agreeable to send referral to [facility] of [address]. Nathanel at [facility] will review referral. TOC will follow.     Update: Pt is over income limit for ALF memory care unit per DSS. Will continue to look for SNF memory care.   Expected Discharge Plan: Skilled Nursing Facility Barriers to Discharge: No SNF bed  Expected Discharge Plan and Services In-house Referral: Clinical Social Work   Post Acute Care Choice: Skilled Nursing Facility Living arrangements for the past 2 months: Single Family Home                                       Social Determinants of Health (SDOH) Interventions SDOH Screenings   Food Insecurity: No Food Insecurity (09/16/2023)  Housing: Low Risk  (09/16/2023)  Transportation Needs: No Transportation Needs (09/16/2023)  Utilities: Not At Risk (09/16/2023)  Social Connections: Patient Unable To Answer (09/16/2023)  Tobacco Use: Medium Risk (09/16/2023)    Readmission Risk Interventions    09/17/2023    4:45 PM   Readmission Risk Prevention Plan  Transportation Screening Complete  PCP or Specialist Appt within 3-5 Days Complete  HRI or Home Care Consult Complete  Social Work Consult for Recovery Care Planning/Counseling Complete  Palliative Care Screening Not Applicable  Medication Review Oceanographer) Complete

## 2023-09-20 NOTE — Progress Notes (Signed)
 PROGRESS NOTE   Jeff Miles  FMW:[id number] DOB: [birth year] DOA: 09/16/2023 PCP: [name], MD   Chief Complaint  Patient presents with   Altered Mental Status   Level of care: Med-Surg  Brief Admission History:  73 year old male with advanced dementia associated with history of alcohol  abuse and Korsakoff's encephalopathy, hypertension, COPD, former longtime smoker, iron  deficiency anemia, type 2 diabetes mellitus, GAD, GERD, BPH with urinary retention, failure to thrive, frequent falls has had several recent hospitalizations most recently discharged on 09/03/2023 after being hospitalized for urinary retention, acute on chronic respiratory failure and metabolic encephalopathy and he was discharged home with home health services with his wife as primary caretaker.  She reports that for the past several days he has had increasing confusion and wandering at night and falling multiple times at home.  He had canceled his outpatient appointment to have a colonoscopy on 09/28/2023.  This was part of a workup for guaiac positive stools and recent colonoscopy was incomplete due to poor prep.  Wife reports that it has been unsafe for him to remain at home because she is unable to care for him due to his frequent falls and nighttime wandering behavior.  She reports that he fell out of a chair this morning and struck his head on the floor.  He was discharged with a Foley catheter but has since been removed and he has been voiding per wife.  She reports that she is unable to care for him at home in his current state.  His ED trauma workup has been negative for acute findings.  He is being admitted for frequent falls and encephalopathy.   Assessment and Plan:  Acute metabolic encephalopathy - RESOLVED  -Likely underlying advanced dementia -No acute metabolic abnormalities found -Treated with IV fluids in the ED for mild dehydration -Monitor neurochecks for now -Continue high-dose thiamine   supplementation -Fall precautions recommended    Frequent falls -Trauma workup in the ED negative for acute findings -Fall precautions recommended -PT evaluation requested -- SNF recommended  -OT evaluation requested -- SNF recommended    Generalized weakness and adult failure to thrive -Family tried caring for him at home but it has become too difficult for the wife to manage alone -Wife did have home health support but this has not been enough -Patient will need placement in a memory care facility -Wife reports home situation unsafe for patient at this time -Given precipitous decline we will ask for palliative medicine consultation for goals of care   Urinary retention -Monitor closely for recurrence -Foley catheter has been removed -Monitor intake and output closely -Catheterize if unable to void -Resumed home tamsulosin  daily   Hypotension - RESOLVED  -Reduced metoprolol  from 50 mg to 25 mg with good results  -Improved some with IV fluids which have completed  -Added holding parameters to metoprolol  orders   COPD with chronic bronchospasm -Resume home bronchodilators -Scheduled bronchodilators ordered via nebulizer   Rectal bleeding with guaiac positive stool -Likely secondary to hemorrhoids seen on colonoscopy -Has been unable to have a colonoscopy due to poor prep and family recently canceled the outpatient procedure that was scheduled for 09/28/2023.   Type 2 diabetes mellitus -SSI coverage ordered  CBG (last 3)  Recent Labs    09/20/23 0322 09/20/23 0728 09/20/23 1121  GLUCAP 162* 155* 151*   Hypokalemia -additional IV Mg and oral potassium given, recheck in AM   Hypomagnesemia --repleted with IV Mg   DVT prophylaxis: enoxaparin  Code Status: Full  Family Communication: updated wife bedside 1/3, telephone 1/6 Disposition: LTC/memory care  Peer review with [facility] 1/7: declined for SNF, recommend LTC or memory care  Consultants:  PT/OT    Procedures:   Antimicrobials:    Subjective: He is hard of  hearing, no complaints, no specific complaints.     Objective: Vitals:   09/20/23 0319 09/20/23 0720 09/20/23 0833 09/20/23 1032  BP: 127/63  125/62 113/66  Pulse: 85  (!) 118 85  Resp: (!) 24  19 18   Temp: 98 F (36.7 C)  98.3 F (36.8 C) 99.8 F (37.7 C)  TempSrc:   Oral Oral  SpO2: 100% 96% 92% 100%  Weight: 91.6 kg     Height:        Intake/Output Summary (Last 24 hours) at 09/20/2023 1152 Last data filed at 09/20/2023 0900 Gross per 24 hour  Intake 960 ml  Output --  Net 960 ml    Filed Weights   09/18/23 0500 09/19/23 0324 09/20/23 0319  Weight: 91 kg 91 kg 91.6 kg   Examination:  General exam: Appears calm and comfortable very hard of hearing.  Respiratory system: Clear to auscultation. Respiratory effort normal. Cardiovascular system: normal S1 & S2 heard. No JVD, murmurs, rubs, gallops or clicks. No pedal edema. Gastrointestinal system: Abdomen is nondistended, soft and nontender. No organomegaly or masses felt. Normal bowel sounds heard. Central nervous system: Alert and oriented to person. No focal neurological deficits. Extremities: Symmetric 5 x 5 power. Skin: No rashes, lesions or ulcers. Psychiatry: Judgement and insight appear diminished. Mood & affect appropriate.   Data Reviewed: I have personally reviewed following labs and imaging studies  CBC: Recent Labs  Lab 09/16/23 1020 09/17/23 0405  WBC 12.9* 9.8  NEUTROABS 11.1* 7.6  HGB 10.2* 9.0*  HCT 34.6* 30.8*  MCV 89.4 90.1  PLT 426* 346    Basic Metabolic Panel: Recent Labs  Lab 09/16/23 1020 09/17/23 0405 09/18/23 0429 09/19/23 0504  NA 141 144 139 137  K 3.3* 3.4* 3.4* 3.6  CL 104 107 101 102  CO2 26 26 28 26   GLUCOSE 106* 124* 147* 152*  BUN 17 14 11 10   CREATININE 1.34* 1.00 0.94 1.05  CALCIUM  9.5 9.1 8.8* 9.0  MG  --  1.3* 1.7 1.6*    CBG: Recent Labs  Lab 09/19/23 1615 09/19/23 2140 09/20/23 0322  09/20/23 0728 09/20/23 1121  GLUCAP 168* 176* 162* 155* 151*    Recent Results (from the past 240 hours)  Resp panel by RT-PCR (RSV, Flu A&B, Covid) Anterior Nasal Swab     Status: None   Collection Time: 09/16/23 10:18 AM   Specimen: Anterior Nasal Swab  Result Value Ref Range Status   SARS Coronavirus 2 by RT PCR NEGATIVE NEGATIVE Final    Comment: (NOTE) SARS-CoV-2 target nucleic acids are NOT DETECTED.  The SARS-CoV-2 RNA is generally detectable in upper respiratory specimens during the acute phase of infection. The lowest concentration of SARS-CoV-2 viral copies this assay can detect is 138 copies/mL. A negative result does not preclude SARS-Cov-2 infection and should not be used as the sole basis for treatment or other patient management decisions. A negative result may occur with  improper specimen collection/handling, submission of specimen other than nasopharyngeal swab, presence of viral mutation(s) within the areas targeted by this assay, and inadequate number of viral copies(<138 copies/mL). A negative result must be combined with clinical observations, patient history, and epidemiological information. The expected result is Negative.  Fact  Sheet for Patients:  bloggercourse.com  Fact Sheet for Healthcare Providers:  seriousbroker.it  This test is no t yet approved or cleared by the [place]  FDA and  has been authorized for detection and/or diagnosis of SARS-CoV-2 by FDA under an Emergency Use Authorization (EUA). This EUA will remain  in effect (meaning this test can be used) for the duration of the COVID-19 declaration under Section 564(b)(1) of the Act, 21 U.S.C.section 360bbb-3(b)(1), unless the authorization is terminated  or revoked sooner.       Influenza A by PCR NEGATIVE NEGATIVE Final   Influenza B by PCR NEGATIVE NEGATIVE Final    Comment: (NOTE) The Xpert Xpress SARS-CoV-2/FLU/RSV plus assay  is intended as an aid in the diagnosis of influenza from Nasopharyngeal swab specimens and should not be used as a sole basis for treatment. Nasal washings and aspirates are unacceptable for Xpert Xpress SARS-CoV-2/FLU/RSV testing.  Fact Sheet for Patients: bloggercourse.com  Fact Sheet for Healthcare Providers: seriousbroker.it  This test is not yet approved or cleared by the [place]  FDA and has been authorized for detection and/or diagnosis of SARS-CoV-2 by FDA under an Emergency Use Authorization (EUA). This EUA will remain in effect (meaning this test can be used) for the duration of the COVID-19 declaration under Section 564(b)(1) of the Act, 21 U.S.C. section 360bbb-3(b)(1), unless the authorization is terminated or revoked.     Resp Syncytial Virus by PCR NEGATIVE NEGATIVE Final    Comment: (NOTE) Fact Sheet for Patients: bloggercourse.com  Fact Sheet for Healthcare Providers: seriousbroker.it  This test is not yet approved or cleared by the [place]  FDA and has been authorized for detection and/or diagnosis of SARS-CoV-2 by FDA under an Emergency Use Authorization (EUA). This EUA will remain in effect (meaning this test can be used) for the duration of the COVID-19 declaration under Section 564(b)(1) of the Act, 21 U.S.C. section 360bbb-3(b)(1), unless the authorization is terminated or revoked.  Performed at [facility], [address], [place], [place] [address]   Blood culture (routine x 2)     Status: None (Preliminary result)   Collection Time: 09/16/23 10:20 AM   Specimen: BLOOD  Result Value Ref Range Status   Specimen Description BLOOD BLOOD RIGHT ARM FOREARM  Final   Special Requests   Final    BOTTLES DRAWN AEROBIC AND ANAEROBIC Blood Culture adequate volume   Culture   Final    NO GROWTH 4 DAYS Performed at [facility], [address],  [place], [place] [address]    Report Status PENDING  Incomplete  Blood culture (routine x 2)     Status: None (Preliminary result)   Collection Time: 09/16/23 10:20 AM   Specimen: BLOOD  Result Value Ref Range Status   Specimen Description BLOOD BLOOD RIGHT ARM RAC  Final   Special Requests   Final    BOTTLES DRAWN AEROBIC AND ANAEROBIC Blood Culture adequate volume   Culture   Final    NO GROWTH 4 DAYS Performed at [facility], [address], [place], [place] [address]    Report Status PENDING  Incomplete     Radiology Studies: No results found.   Scheduled Meds:  atorvastatin   80 mg Oral q1800   cyanocobalamin   1,000 mcg Oral Daily   docusate sodium   100 mg Oral BID   enoxaparin  (LOVENOX ) injection  40 mg Subcutaneous Q24H   fluticasone  furoate-vilanterol  1 puff Inhalation Daily   furosemide   40 mg Oral Daily   guaiFENesin   600 mg Oral BID   insulin  aspart  0-15 Units Subcutaneous TID WC   ipratropium-albuterol   3 mL Nebulization TID   metoprolol  tartrate  25 mg Oral BID   mirtazapine   15 mg Oral QHS   multivitamin with minerals  1 tablet Oral Daily   naltrexone   50 mg Oral Daily   omega-3 acid ethyl esters  1,000 mg Oral Daily   pantoprazole   40 mg Oral Daily   potassium chloride  SA  20 mEq Oral BID   risperiDONE   1 mg Oral QHS   tamsulosin   0.4 mg Oral Daily   thiamine   250 mg Oral Daily   Continuous Infusions:   LOS: 4 days   Time spent: 55 mins  Cloy Cozzens Vicci, MD How to contact the [facility] Attending or Consulting provider 7A - 7P or covering provider during after hours 7P -7A, for this patient?  Check the care team in [facility] and look for a) attending/consulting TRH provider listed and b) the TRH team listed Log into [email or web address] to find provider on call.  Locate the TRH provider you are looking for under [facility] and page to a number that you can be directly reached. If you still have difficulty reaching the provider, please page the [facility] (Director on  Call) for the Hospitalists listed on amion for assistance.  09/20/2023, 11:52 AM

## 2023-09-20 NOTE — Progress Notes (Signed)
   09/20/23 0833  Vitals  Temp 98.3 F (36.8 C)  Temp Source Oral  BP 125/62  MAP (mmHg) 80  BP Location Right Arm  BP Method Automatic  Patient Position (if appropriate) Lying  Pulse Rate (!) 118  Pulse Rate Source Dinamap  Resp 19  MEWS COLOR  MEWS Score Color Yellow  Oxygen Therapy  SpO2 92 %  O2 Device Nasal Cannula  O2 Flow Rate (L/min) 3 L/min (patient pulling off oxygen)  Patient Activity (if Appropriate) In bed  MEWS Score  MEWS Temp 0  MEWS Systolic 0  MEWS Pulse 2  MEWS RR 0  MEWS LOC 0  MEWS Score 2  Provider Notification  Provider Name/Title Dr Vicci  Date Provider Notified 09/20/23  Time Provider Notified [phone]  Method of Notification Page  Notification Reason Other (Comment) (HR  118, B/P 125/62)  Test performed and critical result VITAL SIGNS  Date Critical Result Received 09/20/23  Time Critical Result Received 0833  Provider response No new orders  Date of Provider Response 09/20/23  Time of Provider Response 0900

## 2023-09-20 NOTE — Plan of Care (Signed)
  Problem: Activity: Goal: Risk for activity intolerance will decrease Outcome: Progressing   Problem: Nutrition: Goal: Adequate nutrition will be maintained Outcome: Progressing   Problem: Elimination: Goal: Will not experience complications related to bowel motility Outcome: Progressing Goal: Will not experience complications related to urinary retention Outcome: Progressing   Problem: Pain Management: Goal: General experience of comfort will improve Outcome: Progressing   Problem: Safety: Goal: Ability to remain free from injury will improve Outcome: Progressing   Problem: Skin Integrity: Goal: Risk for impaired skin integrity will decrease Outcome: Progressing   Problem: Metabolic: Goal: Ability to maintain appropriate glucose levels will improve Outcome: Progressing

## 2023-09-21 DIAGNOSIS — I509 Heart failure, unspecified: Secondary | ICD-10-CM

## 2023-09-21 DIAGNOSIS — H9190 Unspecified hearing loss, unspecified ear: Secondary | ICD-10-CM

## 2023-09-21 DIAGNOSIS — Z66 Do not resuscitate: Secondary | ICD-10-CM

## 2023-09-21 LAB — CULTURE, BLOOD (ROUTINE X 2)
Culture: NO GROWTH
Culture: NO GROWTH
Special Requests: ADEQUATE
Special Requests: ADEQUATE

## 2023-09-21 LAB — GLUCOSE, CAPILLARY
Glucose-Capillary: 173 mg/dL — ABNORMAL HIGH (ref 70–99)
Glucose-Capillary: 173 mg/dL — ABNORMAL HIGH (ref 70–99)
Glucose-Capillary: 165 mg/dL — ABNORMAL HIGH (ref 70–99)
Glucose-Capillary: 138 mg/dL — ABNORMAL HIGH (ref 70–99)
Glucose-Capillary: 194 mg/dL — ABNORMAL HIGH (ref 70–99)

## 2023-09-21 MED ORDER — METOPROLOL TARTRATE 25 MG PO TABS
12.5000 mg | ORAL_TABLET | Freq: Once | ORAL | Status: AC
  Administered 2023-09-21: 12.5 mg via ORAL
  Filled 2023-09-21: qty 1

## 2023-09-21 MED ORDER — METOPROLOL TARTRATE 25 MG PO TABS
37.5000 mg | ORAL_TABLET | Freq: Two times a day (BID) | ORAL | Status: DC
  Administered 2023-09-21 – 2023-09-23 (×4): 37.5 mg via ORAL
  Filled 2023-09-21 (×4): qty 2

## 2023-09-21 NOTE — Plan of Care (Signed)
   Problem: Education: Goal: Knowledge of General Education information will improve Description Including pain rating scale, medication(s)/side effects and non-pharmacologic comfort measures Outcome: Progressing   Problem: Health Behavior/Discharge Planning: Goal: Ability to manage health-related needs will improve Outcome: Progressing

## 2023-09-21 NOTE — Consult Note (Signed)
 Palliative Care Consult Note                                  Date: 09/21/2023   Patient Name: Jeff Miles  DOB: 05-18-1951  MRN: [id number]  Age / Sex: 73 y.o., male  PCP: Jeff Norleen PEDLAR, MD Referring Physician: Pearlean Manus, MD  Reason for Consultation: Establishing goals of care  HPI/Patient Profile: 73 y.o. male  with past medical history of advanced dementia associated with history of alcohol  abuse and Korsakoff's encephalopathy, hypertension, COPD, former longtime smoker, iron  deficiency anemia, type 2 diabetes mellitus, GAD, GERD, BPH with urinary retention, failure to thrive, frequent falls has had several recent hospitalizations most recently discharged on 09/03/2023.  He presented with complaints of increasing confusion, wandering at night, falling multiple times at home.  He was admitted on 09/16/2023 with acute metabolic encephalopathy superimposed on advanced dementia, frequent falls, generalized weakness, failure to thrive, and others.   Palliative medicine was consulted for GOC conversations.  Past Medical History:  Diagnosis Date   CHF (congestive heart failure) (HCC)    COPD (chronic obstructive pulmonary disease) (HCC)    Dementia (HCC)    alcohol  related   Diabetes (HCC)    Hard of hearing    History of kidney stones    Hypertension    Right leg weakness    Unsteady gait    Weakness of both legs    Wernicke's encephalopathy     Subjective:   This NP Jeff Miles reviewed medical records, received report from team, assessed the patient and then meet at the patient's bedside to discuss diagnosis, prognosis, GOC, EOL wishes disposition and options.  I met with the patient's wife in the patient's room, although the patient was sleeping and we elected to not wake him.  We excused ourselves to the family room and the patient's wife called the patient's son Jeff Miles.   We meet to discuss diagnosis  prognosis, GOC, EOL wishes, disposition and options. Concept of Palliative Care was introduced as specialized medical care for people and their families living with serious illness.  If focuses on providing relief from the symptoms and stress of a serious illness.  The goal is to improve quality of life for both the patient and the family. Values and goals of care important to patient and family were attempted to be elicited.  Created space and opportunity for patient  and family to explore thoughts and feelings regarding current medical situation   Natural trajectory and current clinical status were discussed. Questions and concerns addressed. Patient  encouraged to call with questions or concerns.    Patient/Family Understanding of Illness: They understand he has multiple chronic illnesses.  He has COPD and was recently started on oxygen due to progression.  He has CHF which they also note he is getting worse, diuretics are helping to maintain.  He also has diabetes as well as dementia.  They know his dementia with slow progressing but recently has had a precipitous decline.  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  They also note in hospital his unusual behavioral disturbances which is very much unlike him.  They note that he has been oblivious to his limitations lately which poses a safety risk.  We spent a substantial amount of time reviewing his chronic illnesses and how these relate to acute illness  and overall trajectory of his illness along with prognostication.  Life Review: The patient has been married 30 years in February only has 1 biological son Jeff Miles) as well as a Jeff Miles through from his current wife's previous marriage.  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  He is described as a avid car freak and enjoyed drag racing and building cause.  He built in [employer].  He is also described as a big  meat eater and enjoyed fishing and deer hunting.  He enjoyed building anything and generally just being outdoors.  And laments that as of late, with his progressive disease, he has not had a good quality of life.  Patient Values: Family, quality of life  Goals: Continue current care, placement for safety after discharge  Today's Discussion: In addition to discussions described above we had extensive discussion on various topics.  We explored his illness in depth as long as common symptoms and complications that accompany advanced dementia.  We discussed the patient's need for placement in memory care as the patient's wife cannot care for him adequately at home and they are concerned about his safety.  I shared that hospitalist notes indicate that they feel discharge home is not safe.  I shared the need for safe discharge disposition before patient is discharged.  This brought comfort to his wife now he was not going to be put on the corner with a bus ticket and handshake.  We discussed in general the process for Medicaid application and placement in long-term care facilities.  They are aware to continue to follow-up with social work on this.  His wife shares that the patient previously applied for and received Medicaid for short time several years ago, and not much is changed.  She is hopeful that the Medicaid application will be approved.  We spent time talking about limitations to care knowing progression of his chronic illnesses including COPD, CHF as well as aggressive progression recently of his advanced dementia.  Specifically we discussed CODE STATUS. Encouraged patient/family to consider DNR/DNI status understanding evidenced based poor outcomes in similar hospitalized patient, as the cause of arrest is likely associated with advanced chronic/terminal illness rather than an easily reversible acute cardio-pulmonary event. I explained that DNR/DNI does not change the medical plan and it only  comes into effect after a person has arrested (died).  It is a protective measure to keep us  from harming the patient in their last moments of life.  Both the patient's wife Jeff Miles and his son Jeff Miles was agreeable to DNR/DNI with understanding that patient would not receive CPR, defibrillation, ACLS medications, or intubation.   Additionally, given advanced dementia and the common place of dysphagia and swallowing difficulties with advanced dementia, we talked about feeding tubes. We had an extensive discussion about feeding tubes.  I describes feeding tubes as useful in certain situations, although typically excepted to be not beneficial and advanced disease where in the body is shutting down.  In the situations feeding tubes statistically do not prolong life, do not prevent aspiration as patients can aspirate on your own saliva and secretions, do not improve quality life, and pose a high risk for injury if the patient becomes confused and pulls out the feeding tube.  For these reasons feeding tubes are often not recommended in situations such as this.  At this time they expressed that the patient would never want a feeding tube.  I shared that many decisions have  been made and our goals and plan are clear at this point.  I shared that palliative medicine would back off but we would be available to come back if needed.  I ensured they had our contact card with phone number to contact us  for any questions or concerns.  I also shared that the medical team and nursing teams know how to reach us  as well.    I provided emotional and general support through therapeutic listening, empathy, sharing of stories, therapeutic touch, and other techniques. I answered all questions and addressed all concerns to the best of my ability.  Review of Systems  Unable to perform ROS: Dementia    Objective:   Primary Diagnoses: Present on Admission:  Acute metabolic encephalopathy  Bronchospasm  COPD (chronic obstructive  pulmonary disease) (HCC)  Restlessness and agitation  Rectal bleeding  Heme + stool  Iron  deficiency anemia, unspecified  GERD (gastroesophageal reflux disease)  Grade II hemorrhoids  AMS (altered mental status)  Fall  Dementia with behavioral disturbance (HCC)  FTT (failure to thrive) in adult  Leukocytosis  Wernicke's encephalopathy  BPH (benign prostatic hyperplasia)  Unsteady gait  Weakness of both legs  Hypokalemia   Physical Exam Vitals and nursing note reviewed.  Constitutional:      General: He is sleeping. He is not in acute distress. HENT:     Head: Normocephalic and atraumatic.  Pulmonary:     Effort: Pulmonary effort is normal. No respiratory distress.  Abdominal:     General: Abdomen is flat.     Vital Signs:  BP (!) 135/95 (BP Location: Right Arm)   Pulse (!) 116   Temp 98.1 F (36.7 C) (Oral)   Resp 19   Ht 5' 9 (1.753 m)   Wt 90 kg   SpO2 97%   BMI 29.30 kg/m   Palliative Assessment/Data: 40-50%    Advanced Care Planning:   Existing Vynca/ACP Documentation: None  Primary Decision Maker: NEXT OF KIN  Code Status/Advance Care Planning: DNR-limited  A discussion was had today regarding advanced directives. Concepts specific to code status, artifical feeding and hydration, continued IV antibiotics and rehospitalization was had.  The difference between a aggressive medical intervention path and a palliative comfort care path for this patient at this time was had.   Decisions/Changes to ACP: None today  Assessment & Plan:   Impression: 73 year old male with acute presentation of chronic comorbidities as described above.  Family is very clear about his progressive health decline especially his precipitous decline and advanced dementia.  He now has significant behavioral disturbances which make it difficult to be cared for at home.  Currently Medicaid application is processing, pending placement to long-term care memory care.  We have had  substantial discussion on goals of care and have elected DNR and have agreed for no feeding tube, based on what the patient's wishes would be.  Overall long-term prognosis poor  SUMMARY OF RECOMMENDATIONS   DNR-limited The patient would never want a feeding tube Continue current scope of care Anticipate prolonged admission awaiting memory care placement Palliative medicine will back off for now Please notify us  of any significant clinical change or new palliative needs  Symptom Management:  Per primary team PMT is available to assist as needed  Prognosis:  Unable to determine  Discharge Planning:  To Be Determined   Discussed with: Patient's family, medical team, nursing team, [facility] team    Thank you for allowing us  to participate in the care of Addam L  Kracke PMT will continue to support holistically.  Time Total: 60 min  Detailed review of medical records (labs, imaging, vital signs), medically appropriate exam, discussed with treatment team, counseling and education to patient, family, & staff, documenting clinical information, medication management, coordination of care  Signed by: Jeff Kays, NP Palliative Medicine Team  Team Phone # [phone] (Nights/Weekends)  09/21/2023, 10:36 AM

## 2023-09-21 NOTE — Progress Notes (Addendum)
   09/21/23 0900  Vitals  Temp 98.1 F (36.7 C)  Temp Source Oral  BP (!) 135/95  MAP (mmHg) 107  BP Location Right Arm  BP Method Automatic  Patient Position (if appropriate) Lying  Pulse Rate (!) 116  Pulse Rate Source Dinamap  Resp 19  Level of Consciousness  Level of Consciousness Alert  MEWS COLOR  MEWS Score Color Yellow  Oxygen Therapy  SpO2 97 %  O2 Device Nasal Cannula  O2 Flow Rate (L/min) 3 L/min  Pain Assessment  Pain Scale 0-10  Pain Score 0  PAINAD (Pain Assessment in Advanced Dementia)  Breathing 0  Negative Vocalization 0  Facial Expression 0  Body Language 0  Consolability 0  PAINAD Score 0  MEWS Score  MEWS Temp 0  MEWS Systolic 0  MEWS Pulse 2  MEWS RR 0  MEWS LOC 0  MEWS Score 2  Provider Notification  Provider Name/Title Dr Rendall  Date Provider Notified 09/21/23  Time Provider Notified [phone]  Method of Notification Page  Notification Reason Other (Comment) (B/P 135/95,HR 118)  Test performed and critical result VITAL SIGNS  Date Critical Result Received 09/21/23  Time Critical Result Received 0900  Provider response See new orders  Date of Provider Response 09/21/23  Time of Provider Response [phone]

## 2023-09-21 NOTE — TOC Progression Note (Addendum)
 Transition of Care [facility]) - Progression Note    Patient Details  Name: Jeff Miles MRN: [id number] Date of Birth: 07/11/1951  [facility] CM/SW Contact  Jeff Miles, [place] Phone Number: 09/21/2023, 1:58 PM  Clinical Narrative:  Per wife, they are unable to pay $9300 upfront for SNF. Tammy with [facility] will check with facility to see if they are willing to consider pt with LOG. LCSW followed up with [facility] and they cannot accept pt Medicaid pending. LCSW sent referral to [facility] in [place] and [facility]. Wife updated. LCSW left voicemail for admissions at [facility] and spoke with admissions at [place]. They will send to corporate to see if pt will be considered with Medicaid pending. Wife requested that LCSW speak with pt's son. LCSW updated son, Rushi on placement process. He requested SNF denial be sent to him. LCSW emailed and left in room. TOC will continue placement search.    Update: [facility] is unable to accept pt Medicaid pending with LOG.    Expected Discharge Plan: Skilled Nursing Facility Barriers to Discharge: No SNF bed  Expected Discharge Plan and Services In-house Referral: Clinical Social Work   Post Acute Care Choice: Skilled Nursing Facility Living arrangements for the past 2 months: Single Family Home                                       Social Determinants of Health (SDOH) Interventions SDOH Screenings   Food Insecurity: No Food Insecurity (09/16/2023)  Housing: Low Risk  (09/16/2023)  Transportation Needs: No Transportation Needs (09/16/2023)  Utilities: Not At Risk (09/16/2023)  Social Connections: Patient Unable To Answer (09/16/2023)  Tobacco Use: Medium Risk (09/16/2023)    Readmission Risk Interventions    09/17/2023    4:45 PM  Readmission Risk Prevention Plan  Transportation Screening Complete  PCP or Specialist Appt within 3-5 Days Complete  HRI or Home Care Consult Complete  Social  Work Consult for Recovery Care Planning/Counseling Complete  Palliative Care Screening Not Applicable  Medication Review Oceanographer) Complete

## 2023-09-21 NOTE — Progress Notes (Signed)
 PROGRESS NOTE   Jeff Miles  FMW:[id number] DOB: Jan 03, 1951 DOA: 09/16/2023 PCP: [name], MD   Chief Complaint  Patient presents with   Altered Mental Status   Level of care: Med-Surg  Brief Admission History:  73 year old male with advanced dementia associated with history of alcohol  abuse and Korsakoff's encephalopathy, hypertension, COPD, former longtime smoker, iron  deficiency anemia, type 2 diabetes mellitus, GAD, GERD, BPH with urinary retention, failure to thrive, frequent falls has had several recent hospitalizations most recently discharged on 09/03/2023 after being hospitalized for urinary retention, acute on chronic respiratory failure and metabolic encephalopathy and he was discharged home with home health services with his wife as primary caretaker.  She reports that for the past several days he has had increasing confusion and wandering at night and falling multiple times at home.  He had canceled his outpatient appointment to have a colonoscopy on 09/28/2023.  This was part of a workup for guaiac positive stools and recent colonoscopy was incomplete due to poor prep.  Wife reports that it has been unsafe for him to remain at home because she is unable to care for him due to his frequent falls and nighttime wandering behavior.  She reports that he fell out of a chair this morning and struck his head on the floor.  He was discharged with a Foley catheter but has since been removed and he has been voiding per wife.  She reports that she is unable to care for him at home in his current state.  His ED trauma workup has been negative for acute findings.  He is being admitted for frequent falls and encephalopathy.   Disposition:- Peer to peer review with [facility] 09/20/23: declined for SNF, recommend LTC or memory care -Awaiting insurance authorization/payer source for long-term memory unit--- Medicaid application in progress  Assessment and Plan:  1)Acute Metabolic  Encephalopathy - RESOLVED  - Patient appears to have underlying advanced dementia -No acute metabolic abnormalities found -Treated with IV fluids in the ED for mild dehydration -Received thiamine  -Fall precautions recommended    2)Frequent Falls -Trauma workup in the ED negative for acute findings -Fall precautions recommended -PT/OT evaluation requested -- SNF recommended ---insurance declines SNF recommends long-term care memory unit   3)Generalized weakness and adult failure to thrive/social/ethics -Family tried caring for him at home but it has become too difficult for the wife to manage alone -Wife did have home health support but this has not been enough -Patient will need placement in a memory care facility -Wife reports home situation unsafe for patient at this time -Palliative care consult appreciated, patient is a DNR/DNI  4)Urinary retention -Monitor closely for recurrence -Foley catheter has been removed -Monitor intake and output closely -Catheterize if unable to void -Resumed home tamsulosin  daily   5)Hypotension - RESOLVED  - Continue metoprolol  37.5 mg twice daily-- Prior to admission patient was on 50 mg twice daily -Improved some with IV fluids which have completed  -Added holding parameters to metoprolol  orders   6)COPD with chronic bronchospasm -c/n  home bronchodilators -Scheduled bronchodilators ordered via nebulizer   7)Rectal bleeding with guaiac positive stool -Likely secondary to hemorrhoids seen on colonoscopy -Has been unable to have a colonoscopy due to poor prep and family recently canceled the outpatient procedure that was scheduled for 09/28/2023.   8)DM2- Use Novolog /Humalog Sliding scale insulin  with Accu-Cheks/Fingersticks as ordered   9)Hypokalemia/hypomagnesemia - Replaced   DVT prophylaxis: enoxaparin  Code Status: DNR Family Communication: Wife is primary contact  disposition: LTC/memory care  Peer to peer review with [facility]  [employer] 09/20/23: declined for SNF, recommend LTC or memory care  Consultants:  PT/OT   Subjective:  -Resting comfortably -Cooperative  Objective: Vitals:   09/21/23 0757 09/21/23 0900 09/21/23 1131 09/21/23 1400  BP:  (!) 135/95 (!) 142/86   Pulse:  (!) 116 100   Resp:  19 19   Temp:  98.1 F (36.7 C) 97.9 F (36.6 C)   TempSrc:  Oral Oral   SpO2: 96% 97% 99% 98%  Weight:      Height:        Intake/Output Summary (Last 24 hours) at 09/21/2023 1759 Last data filed at 09/21/2023 1300 Gross per 24 hour  Intake 720 ml  Output --  Net 720 ml    Filed Weights   09/19/23 0324 09/20/23 0319 09/21/23 0500  Weight: 91 kg 91.6 kg 90 kg   Physical Exam Gen:- Awake Alert, in no acute distress  HEENT:- [place].AT, No sclera icterus Nose-- 3L/min Ears---HOH Neck-Supple Neck,No JVD,.  Lungs-  CTAB , fair air movement bilaterally  CV- S1, S2 normal, RRR Abd-  +ve B.Sounds, Abd Soft, No tenderness,    Extremity/Skin:-Scaly dry skin on legs, chronic venous stasis , good pedal pulses  Psych--pleasantly confused, obvious cognitive and memory deficits Neuro-generalized weakness, no new focal deficits, no tremors   Data Reviewed: I have personally reviewed following labs and imaging studies  CBC: Recent Labs  Lab 09/16/23 1020 09/17/23 0405  WBC 12.9* 9.8  NEUTROABS 11.1* 7.6  HGB 10.2* 9.0*  HCT 34.6* 30.8*  MCV 89.4 90.1  PLT 426* 346    Basic Metabolic Panel: Recent Labs  Lab 09/16/23 1020 09/17/23 0405 09/18/23 0429 09/19/23 0504  NA 141 144 139 137  K 3.3* 3.4* 3.4* 3.6  CL 104 107 101 102  CO2 26 26 28 26   GLUCOSE 106* 124* 147* 152*  BUN 17 14 11 10   CREATININE 1.34* 1.00 0.94 1.05  CALCIUM  9.5 9.1 8.8* 9.0  MG  --  1.3* 1.7 1.6*    CBG: Recent Labs  Lab 09/20/23 2138 09/21/23 0312 09/21/23 0729 09/21/23 1126 09/21/23 1628  GLUCAP 176* 173* 173* 165* 138*    Recent Results (from the past 240 hours)  Resp panel by RT-PCR (RSV, Flu A&B, Covid)  Anterior Nasal Swab     Status: None   Collection Time: 09/16/23 10:18 AM   Specimen: Anterior Nasal Swab  Result Value Ref Range Status   SARS Coronavirus 2 by RT PCR NEGATIVE NEGATIVE Final    Comment: (NOTE) SARS-CoV-2 target nucleic acids are NOT DETECTED.  The SARS-CoV-2 RNA is generally detectable in upper respiratory specimens during the acute phase of infection. The lowest concentration of SARS-CoV-2 viral copies this assay can detect is 138 copies/mL. A negative result does not preclude SARS-Cov-2 infection and should not be used as the sole basis for treatment or other patient management decisions. A negative result may occur with  improper specimen collection/handling, submission of specimen other than nasopharyngeal swab, presence of viral mutation(s) within the areas targeted by this assay, and inadequate number of viral copies(<138 copies/mL). A negative result must be combined with clinical observations, patient history, and epidemiological information. The expected result is Negative.  Fact Sheet for Patients:  bloggercourse.com  Fact Sheet for Healthcare Providers:  seriousbroker.it  This test is no t yet approved or cleared by the [place]  FDA and  has been authorized for detection and/or diagnosis of SARS-CoV-2 by  FDA under an Emergency Use Authorization (EUA). This EUA will remain  in effect (meaning this test can be used) for the duration of the COVID-19 declaration under Section 564(b)(1) of the Act, 21 U.S.C.section 360bbb-3(b)(1), unless the authorization is terminated  or revoked sooner.       Influenza A by PCR NEGATIVE NEGATIVE Final   Influenza B by PCR NEGATIVE NEGATIVE Final    Comment: (NOTE) The Xpert Xpress SARS-CoV-2/FLU/RSV plus assay is intended as an aid in the diagnosis of influenza from Nasopharyngeal swab specimens and should not be used as a sole basis for treatment. Nasal washings  and aspirates are unacceptable for Xpert Xpress SARS-CoV-2/FLU/RSV testing.  Fact Sheet for Patients: bloggercourse.com  Fact Sheet for Healthcare Providers: seriousbroker.it  This test is not yet approved or cleared by the [place]  FDA and has been authorized for detection and/or diagnosis of SARS-CoV-2 by FDA under an Emergency Use Authorization (EUA). This EUA will remain in effect (meaning this test can be used) for the duration of the COVID-19 declaration under Section 564(b)(1) of the Act, 21 U.S.C. section 360bbb-3(b)(1), unless the authorization is terminated or revoked.     Resp Syncytial Virus by PCR NEGATIVE NEGATIVE Final    Comment: (NOTE) Fact Sheet for Patients: bloggercourse.com  Fact Sheet for Healthcare Providers: seriousbroker.it  This test is not yet approved or cleared by the [place]  FDA and has been authorized for detection and/or diagnosis of SARS-CoV-2 by FDA under an Emergency Use Authorization (EUA). This EUA will remain in effect (meaning this test can be used) for the duration of the COVID-19 declaration under Section 564(b)(1) of the Act, 21 U.S.C. section 360bbb-3(b)(1), unless the authorization is terminated or revoked.  Performed at [facility] [address], [place], [place] [address]   Blood culture (routine x 2)     Status: None   Collection Time: 09/16/23 10:20 AM   Specimen: BLOOD  Result Value Ref Range Status   Specimen Description BLOOD BLOOD RIGHT ARM FOREARM  Final   Special Requests   Final    BOTTLES DRAWN AEROBIC AND ANAEROBIC Blood Culture adequate volume   Culture   Final    NO GROWTH 5 DAYS Performed at [facility], [address], [place], [place] [address]    Report Status 09/21/2023 FINAL  Final  Blood culture (routine x 2)     Status: None   Collection Time: 09/16/23 10:20 AM   Specimen: BLOOD  Result  Value Ref Range Status   Specimen Description BLOOD BLOOD RIGHT ARM RAC  Final   Special Requests   Final    BOTTLES DRAWN AEROBIC AND ANAEROBIC Blood Culture adequate volume   Culture   Final    NO GROWTH 5 DAYS Performed at [facility], [address], [place], [place] [address]    Report Status 09/21/2023 FINAL  Final   Radiology Studies: No results found.  Scheduled Meds:  atorvastatin   80 mg Oral q1800   cyanocobalamin   1,000 mcg Oral Daily   docusate sodium   100 mg Oral BID   enoxaparin  (LOVENOX ) injection  40 mg Subcutaneous Q24H   fluticasone  furoate-vilanterol  1 puff Inhalation Daily   furosemide   40 mg Oral Daily   insulin  aspart  0-15 Units Subcutaneous TID WC   ipratropium-albuterol   3 mL Nebulization TID   metoprolol  tartrate  37.5 mg Oral BID   mirtazapine   15 mg Oral QHS   multivitamin with minerals  1 tablet Oral Daily   naltrexone   50 mg Oral Daily   omega-3 acid ethyl esters  1,000 mg Oral Daily   pantoprazole   40 mg Oral Daily   potassium chloride  SA  20 mEq Oral BID   risperiDONE   1 mg Oral QHS   tamsulosin   0.4 mg Oral Daily   thiamine   250 mg Oral Daily   Continuous Infusions:   LOS: 5 days   Rendall Carwin, MD How to contact the [facility] Attending or Consulting provider 7A - 7P or covering provider during after hours 7P -7A, for this patient?   09/21/2023, 5:59 PM

## 2023-09-21 NOTE — Plan of Care (Signed)
  Problem: Nutrition: Goal: Adequate nutrition will be maintained Outcome: Progressing   Problem: Nutrition: Goal: Adequate nutrition will be maintained Outcome: Progressing   

## 2023-09-21 NOTE — Progress Notes (Signed)
 Physical Therapy Treatment Patient Details Name: Jeff Miles MRN: [id number] DOB: 07/15/1951 Today's Date: 09/21/2023   History of Present Illness 73 year old male with advanced dementia associated with history of alcohol  abuse and Korsakoff's encephalopathy, hypertension, COPD, former longtime smoker, iron  deficiency anemia, type 2 diabetes mellitus, GAD, GERD, BPH with urinary retention, failure to thrive, frequent falls has had several recent hospitalizations most recently discharged on 09/03/2023 after being hospitalized for urinary retention, acute on chronic respiratory failure and metabolic encephalopathy and he was discharged home with home health services with his wife as primary caretaker.  She reports that for the past several days he has had increasing confusion and wandering at night and falling multiple times at home.  He had canceled his outpatient appointment to have a colonoscopy on 09/28/2023.  This was part of a workup for guaiac positive stools and recent colonoscopy was incomplete due to poor prep.  Wife reports that it has been unsafe for him to remain at home because she is unable to care for him due to his frequent falls and nighttime wandering behavior.  She reports that he fell out of a chair this morning and struck his head on the floor.  He was discharged with a Foley catheter but has since been removed and he has been voiding per wife.  She reports that she is unable to care for him at home in his current state.  His ED trauma workup has been negative for acute findings.  He is being admitted for frequent falls and encephalopathy.    PT Comments  Pt supine in bed and willing to participate.  Pt A&O x 2, pt unaware of where he is at.  Delayed response and tendency to stick out tough and pull with hands when questions asked.  Mod A with bed mobility, increased time and labored movements.  Mod-Max A for standing with multimodal cueing required for hand and foot position prior  standing. Pt unable to stand full erect and unsafe to transfer at this time.  Seated exercises complete for LE strengthening.  Mod A with transfer from sitting to supine.  EOS pt left in bed with call bell within reach, bed alarm set and wife present in room.    If plan is discharge home, recommend the following:     Can travel by private vehicle        Equipment Recommendations       Recommendations for Other Services       Precautions / Restrictions Precautions Precautions: Fall Restrictions Weight Bearing Restrictions Per Provider Order: No     Mobility  Bed Mobility                    Transfers                        Ambulation/Gait                   Stairs             Wheelchair Mobility     Tilt Bed    Modified Rankin (Stroke Patients Only)       Balance                                            Cognition Arousal: Alert Behavior During Therapy: [facility] for tasks assessed/performed Overall  Cognitive Status: History of cognitive impairments - at baseline                                 General Comments: A&O x2, not aware of where he is at.  Delayed response        Exercises General Exercises - Lower Extremity Long Arc Quad: AROM, Both, 5 reps, Seated Hip Flexion/Marching: AROM, Both, 5 reps, Seated    General Comments        Pertinent Vitals/Pain Pain Assessment Pain Assessment: No/denies pain    Home Living                          Prior Function            PT Goals (current goals can now be found in the care plan section)      Frequency           PT Plan      Co-evaluation              AM-PAC PT 6 Clicks Mobility   Outcome Measure                   End of Session Equipment Utilized During Treatment: Gait belt Activity Tolerance: Patient tolerated treatment well;Patient limited by fatigue Patient left: in bed;with call bell/phone  within reach;with bed alarm set;with family/visitor present Nurse Communication: Mobility status       Time: 9189-9154 PT Time Calculation (min) (ACUTE ONLY): 35 min  Charges:                            Augustin Mclean, LPTA/CLT; CBIS [phone]  Mclean Augustin Amble 09/21/2023, 9:20 AM

## 2023-09-22 LAB — GLUCOSE, CAPILLARY
Glucose-Capillary: 140 mg/dL — ABNORMAL HIGH (ref 70–99)
Glucose-Capillary: 169 mg/dL — ABNORMAL HIGH (ref 70–99)
Glucose-Capillary: 174 mg/dL — ABNORMAL HIGH (ref 70–99)
Glucose-Capillary: 214 mg/dL — ABNORMAL HIGH (ref 70–99)
Glucose-Capillary: 192 mg/dL — ABNORMAL HIGH (ref 70–99)
Glucose-Capillary: 190 mg/dL — ABNORMAL HIGH (ref 70–99)

## 2023-09-22 LAB — RENAL FUNCTION PANEL
Albumin: 2.8 g/dL — ABNORMAL LOW (ref 3.5–5.0)
Anion gap: 9 (ref 5–15)
BUN: 17 mg/dL (ref 8–23)
CO2: 25 mmol/L (ref 22–32)
Calcium: 9.5 mg/dL (ref 8.9–10.3)
Chloride: 101 mmol/L (ref 98–111)
Creatinine, Ser: 1.06 mg/dL (ref 0.61–1.24)
GFR, Estimated: >60 mL/min (ref >60)
Glucose, Bld: 171 mg/dL — ABNORMAL HIGH (ref 70–99)
Phosphorus: 3.4 mg/dL (ref 2.5–4.6)
Potassium: 4.5 mmol/L (ref 3.5–5.1)
Sodium: 135 mmol/L (ref 135–145)

## 2023-09-22 LAB — CBC
HCT: 32.7 % — ABNORMAL LOW (ref 39.0–52.0)
Hemoglobin: 9.5 g/dL — ABNORMAL LOW (ref 13.0–17.0)
MCH: 25.7 pg — ABNORMAL LOW (ref 26.0–34.0)
MCHC: 29.1 g/dL — ABNORMAL LOW (ref 30.0–36.0)
MCV: 88.4 fL (ref 80.0–100.0)
Platelets: 287 10*3/uL (ref 150–400)
RBC: 3.7 MIL/uL — ABNORMAL LOW (ref 4.22–5.81)
RDW: 14.7 % (ref 11.5–15.5)
WBC: 7.7 10*3/uL (ref 4.0–10.5)
nRBC: 0 % (ref 0.0–0.2)

## 2023-09-22 LAB — MAGNESIUM: Magnesium: 1.8 mg/dL (ref 1.7–2.4)

## 2023-09-22 NOTE — Progress Notes (Signed)
 Physical Therapy Treatment Patient Details Name: Jeff Miles MRN: [id number] DOB: 04-24-1951 Today's Date: 09/22/2023   History of Present Illness 73 year old male with advanced dementia associated with history of alcohol  abuse and Korsakoff's encephalopathy, hypertension, COPD, former longtime smoker, iron  deficiency anemia, type 2 diabetes mellitus, GAD, GERD, BPH with urinary retention, failure to thrive, frequent falls has had several recent hospitalizations most recently discharged on 09/03/2023 after being hospitalized for urinary retention, acute on chronic respiratory failure and metabolic encephalopathy and he was discharged home with home health services with his wife as primary caretaker.  She reports that for the past several days he has had increasing confusion and wandering at night and falling multiple times at home.  He had canceled his outpatient appointment to have a colonoscopy on 09/28/2023.  This was part of a workup for guaiac positive stools and recent colonoscopy was incomplete due to poor prep.  Wife reports that it has been unsafe for him to remain at home because she is unable to care for him due to his frequent falls and nighttime wandering behavior.  She reports that he fell out of a chair this morning and struck his head on the floor.  He was discharged with a Foley catheter but has since been removed and he has been voiding per wife.  She reports that she is unable to care for him at home in his current state.  His ED trauma workup has been negative for acute findings.  He is being admitted for frequent falls and encephalopathy.    PT Comments  Patient requires repeated verbal/tactile cueing for follow instructions with fair/poor carryover, assistance for completing most exercises and unable to complete full standing due to weakness and poor carryover for using RW.  Patient put back to bed with Max assist for repositioning.  Patient will benefit from continued skilled  physical therapy in hospital and recommended venue below to increase strength, balance, endurance for safe ADLs and gait.     If plan is discharge home, recommend the following: A lot of help with bathing/dressing/bathroom;A lot of help with walking and/or transfers;Help with stairs or ramp for entrance   Can travel by private vehicle     No  Equipment Recommendations  Rolling walker (2 wheels)    Recommendations for Other Services       Precautions / Restrictions Precautions Precautions: Fall Restrictions Weight Bearing Restrictions Per Provider Order: No     Mobility  Bed Mobility Overal bed mobility: Needs Assistance Bed Mobility: Supine to Sit     Supine to sit: Mod assist, Max assist     General bed mobility comments: increased time, labored movement    Transfers Overall transfer level: Needs assistance Equipment used: Rolling walker (2 wheels) Transfers: Sit to/from Stand Sit to Stand: Max assist           General transfer comment: Max assist to partially lift bottom off bed    Ambulation/Gait                   Stairs             Wheelchair Mobility     Tilt Bed    Modified Rankin (Stroke Patients Only)       Balance Overall balance assessment: Needs assistance Sitting-balance support: Feet supported, No upper extremity supported Sitting balance-Leahy Scale: Fair Sitting balance - Comments: seated at EOB  Cognition Arousal: Alert Behavior During Therapy: Flat affect Overall Cognitive Status: History of cognitive impairments - at baseline                                          Exercises General Exercises - Lower Extremity Ankle Circles/Pumps: Seated, AROM, Strengthening, Both, 10 reps Long Arc Quad: Seated, AAROM, Strengthening, Both, 10 reps    General Comments        Pertinent Vitals/Pain Pain Assessment Pain Assessment: No/denies pain     Home Living                          Prior Function            PT Goals (current goals can now be found in the care plan section) Acute Rehab PT Goals Patient Stated Goal: return home with family to assist PT Goal Formulation: With patient Time For Goal Achievement: 10/02/23 Potential to Achieve Goals: Good Progress towards PT goals: Progressing toward goals    Frequency    Min 3X/week      PT Plan      Co-evaluation              AM-PAC PT 6 Clicks Mobility   Outcome Measure  Help needed turning from your back to your side while in a flat bed without using bedrails?: A Lot Help needed moving from lying on your back to sitting on the side of a flat bed without using bedrails?: A Lot Help needed moving to and from a bed to a chair (including a wheelchair)?: A Lot Help needed standing up from a chair using your arms (e.g., wheelchair or bedside chair)?: A Lot Help needed to walk in hospital room?: A Lot Help needed climbing 3-5 steps with a railing? : Total 6 Click Score: 11    End of Session Equipment Utilized During Treatment: Oxygen Activity Tolerance: Patient tolerated treatment well;Patient limited by fatigue Patient left: in bed;with call bell/phone within reach;with bed alarm set Nurse Communication: Mobility status PT Visit Diagnosis: Unsteadiness on feet (R26.81);Other abnormalities of gait and mobility (R26.89);Muscle weakness (generalized) (M62.81)     Time: 8544-8482 PT Time Calculation (min) (ACUTE ONLY): 22 min  Charges:    $Therapeutic Exercise: 8-22 mins $Therapeutic Activity: 8-22 mins PT General Charges $$ ACUTE PT VISIT: 1 Visit                    3:54 PM, 09/22/23 Lynwood Music, MPT Physical Therapist with [facility] [phone] office [phone] mobile phone

## 2023-09-22 NOTE — Plan of Care (Signed)

## 2023-09-22 NOTE — TOC Progression Note (Signed)
 Transition of Care [facility]) - Progression Note    Patient Details  Name: Jeff Miles MRN: [id number] Date of Birth: September 07, 1951  Transition of Care [facility]) CM/SW Contact  [place] Saddie Kim, [place] Phone Number: 09/22/2023, 1:45 PM  Clinical Narrative: LCSW spoke with [employer] and still awaiting corporate decision. Per admissions at [facility], they have bed on memory care unit and can accept Medicaid pending. Admissions to review referral. LCSW spoke with Marval at [facility] who will come to hospital to assess pt today to see if they can make bed offer as pt was unable to stand with PT so would not be a flight risk. LCSW discussed with MD who agrees at this time. TOC will continue to work on placement.       Expected Discharge Plan: Skilled Nursing Facility Barriers to Discharge: No SNF bed  Expected Discharge Plan and Services In-house Referral: Clinical Social Work   Post Acute Care Choice: Skilled Nursing Facility Living arrangements for the past 2 months: Single Family Home                                       Social Determinants of Health (SDOH) Interventions SDOH Screenings   Food Insecurity: No Food Insecurity (09/16/2023)  Housing: Low Risk  (09/16/2023)  Transportation Needs: No Transportation Needs (09/16/2023)  Utilities: Not At Risk (09/16/2023)  Social Connections: Patient Unable To Answer (09/16/2023)  Tobacco Use: Medium Risk (09/16/2023)    Readmission Risk Interventions    09/17/2023    4:45 PM  Readmission Risk Prevention Plan  Transportation Screening Complete  PCP or Specialist Appt within 3-5 Days Complete  HRI or Home Care Consult Complete  Social Work Consult for Recovery Care Planning/Counseling Complete  Palliative Care Screening Not Applicable  Medication Review [employer]) Complete

## 2023-09-22 NOTE — Consult Note (Signed)
 [facility] Liaison Consult Note    09/22/2023  JORDY VERBA 11-Feb-1951 [id number]  Insurance: [employer]  Primary Care Provider: [name], MD, this provider is listed for the transition of care  [TOC] follow up appointments  and [facility] calls   [facility] Liaison screened the patient remotely at [facility]. Coverage review for Olam Ku, RN [facility] Liaison   The patient was screened for 30 day readmission hospitalization with noted extreme risk score for unplanned readmission risk 1 ED and 3 hospital admissions in 6 months.  The patient was assessed for potential Community Care Coordination service needs for post hospital transition for care coordination. Review of patient's electronic medical record reveals patient is being recommended for a skilled nursing facility LOC for short term rehab per PT OT evals noted. However, insurance was not approved. Reviewed inpatient TOC LCSW for barriers and note Medicaid is pending.  Plan: [facility], [facility], [facility] Liaison will continue to follow progress and disposition to asess for post hospital community care coordination/management needs.  Referral request for community care coordination: will send update to [facility] Liaison to continue to follow for progress and needs.   [facility], Population Health does not replace or interfere with any arrangements made by the Inpatient Transition of Care team.   For questions contact:   Richerd Fish, RN, BSN, CCM [place]  [facility], Population Health, [facility] Liaison Direct Dial: [phone] or secure chat Email: [email or web address]

## 2023-09-22 NOTE — Progress Notes (Signed)
   09/22/23 1930  Assess: MEWS Score  Temp 99.8 F (37.7 C)  BP (!) 176/77 (told rn bp is up)  Pulse Rate (!) 119  Resp 20  SpO2 98 %  O2 Device Nasal Cannula  O2 Flow Rate (L/min) 3 L/min  Assess: MEWS Score  MEWS Temp 0  MEWS Systolic 0  MEWS Pulse 2  MEWS RR 0  MEWS LOC 1  MEWS Score 3  MEWS Score Color Yellow  Assess: if the MEWS score is Yellow or Red  Were vital signs accurate and taken at a resting state? Yes  Does the patient meet 2 or more of the SIRS criteria? No  MEWS guidelines implemented  No, previously yellow, continue vital signs every 4 hours  Assess: SIRS CRITERIA  SIRS Temperature  0  SIRS Respirations  0  SIRS Pulse 1  SIRS WBC 0  SIRS Score Sum  1

## 2023-09-22 NOTE — Progress Notes (Signed)
 PROGRESS NOTE   Jeff Miles  FMW:[id number] DOB: 03/28/1951 DOA: 09/16/2023 PCP: [name], MD   Chief Complaint  Patient presents with   Altered Mental Status   Level of care: Med-Surg  Brief Admission History:  73 year old male with advanced dementia associated with history of alcohol  abuse and Korsakoff's encephalopathy, hypertension, COPD, former longtime smoker, iron  deficiency anemia, type 2 diabetes mellitus, GAD, GERD, BPH with urinary retention, failure to thrive, frequent falls has had several recent hospitalizations most recently discharged on 09/03/2023 after being hospitalized for urinary retention, acute on chronic respiratory failure and metabolic encephalopathy and he was discharged home with home health services with his wife as primary caretaker.  She reports that for the past several days he has had increasing confusion and wandering at night and falling multiple times at home.  He had canceled his outpatient appointment to have a colonoscopy on 09/28/2023.  This was part of a workup for guaiac positive stools and recent colonoscopy was incomplete due to poor prep.  Wife reports that it has been unsafe for him to remain at home because she is unable to care for him due to his frequent falls and nighttime wandering behavior.  She reports that he fell out of a chair this morning and struck his head on the floor.  He was discharged with a Foley catheter but has since been removed and he has been voiding per wife.  She reports that she is unable to care for him at home in his current state.  His ED trauma workup has been negative for acute findings.  He is being admitted for frequent falls and encephalopathy.   Disposition:- Peer to peer review with [employer] 09/20/23: declined for SNF, recommend LTC or memory care -Awaiting insurance authorization/payer source for long-term memory unit--- Medicaid application in progress -Remains medically stable for discharge to  SNF facility when SNF bed can be found and insurance authorization/payer source is available  Assessment and Plan:  1)Acute Metabolic Encephalopathy - RESOLVED  - Patient appears to have underlying advanced dementia -No acute metabolic abnormalities found -Received thiamine  -Fall precautions recommended    2)Frequent Falls -Trauma workup in the ED negative for acute findings -Fall precautions recommended -PT/OT evaluation requested -- SNF recommended ---insurance declines SNF recommends long-term care memory unit   3)Generalized weakness and adult failure to thrive/social/ethics -Family tried caring for him at home but it has become too difficult for the wife to manage alone -Wife did have home health support but this has not been enough -Patient will need placement in a memory care facility Vs SNF -Wife reports home situation unsafe for patient at this time -Palliative care consult appreciated, patient is a DNR/DNI  4)Urinary retention -Foley catheter has been removed -Monitor intake and output closely -Catheterize if unable to void -c/n  home tamsulosin  daily   5)Hypotension - RESOLVED  - Continue metoprolol  37.5 mg twice daily-- Prior to admission patient was on 50 mg twice daily -Improved some with IV fluids which have completed  -Added holding parameters to metoprolol  orders   6)COPD with chronic bronchospasm -c/n  home bronchodilators -Scheduled bronchodilators ordered via nebulizer   7)Rectal bleeding with guaiac positive stool -Likely secondary to hemorrhoids seen on colonoscopy -Has been unable to have a colonoscopy due to poor prep and family recently canceled the outpatient procedure that was scheduled for 09/28/2023.   8)DM2-.  A1c 5.5 reflecting excellent diabetic control PTA Use Novolog /Humalog Sliding scale insulin  with Accu-Cheks/Fingersticks as ordered  9)Hypokalemia/hypomagnesemia - Replaced  DVT prophylaxis: enoxaparin  Code Status: DNR Family  Communication: Wife is primary contact  disposition: LTC/memory care  Peer to peer review with [employer] 09/20/23: declined for SNF, recommend LTC or memory care  Consultants:  PT/OT   Subjective:  -Patient's wife and sister-in-law Rock at bedside, questions answered - Weakness and fatigue persist -Remains medically stable for discharge to SNF facility when SNF bed can be found and insurance authorization/payer source is available  Objective: Vitals:   09/22/23 0826 09/22/23 1416 09/22/23 1429 09/22/23 1537  BP:  (!) 166/52  128/80  Pulse:  (!) 106  (!) 108  Resp:    20  Temp:  (!) 100.4 F (38 C)  98.3 F (36.8 C)  TempSrc:  Axillary  Oral  SpO2: 96% 100% 93% 99%  Weight:      Height:        Intake/Output Summary (Last 24 hours) at 09/22/2023 1744 Last data filed at 09/22/2023 1300 Gross per 24 hour  Intake 720 ml  Output 200 ml  Net 520 ml    Filed Weights   09/20/23 0319 09/21/23 0500 09/22/23 0521  Weight: 91.6 kg 90 kg 88.2 kg   Physical Exam Gen:- Awake Alert, in no acute distress  HEENT:- [place].AT, No sclera icterus Nose-- 3L/min Ears---HOH Neck-Supple Neck,No JVD,.  Lungs-  CTAB , fair air movement bilaterally  CV- S1, S2 normal, RRR Abd-  +ve B.Sounds, Abd Soft, No tenderness,    Extremity/Skin:-Scaly dry skin on legs, chronic venous stasis , good pedal pulses  Psych--pleasantly confused, obvious cognitive and memory deficits Neuro-generalized weakness, no new focal deficits, no tremors   Data Reviewed: I have personally reviewed following labs and imaging studies  CBC: Recent Labs  Lab 09/16/23 1020 09/17/23 0405 09/22/23 0342  WBC 12.9* 9.8 7.7  NEUTROABS 11.1* 7.6  --   HGB 10.2* 9.0* 9.5*  HCT 34.6* 30.8* 32.7*  MCV 89.4 90.1 88.4  PLT 426* 346 287    Basic Metabolic Panel: Recent Labs  Lab 09/16/23 1020 09/17/23 0405 09/18/23 0429 09/19/23 0504 09/22/23 0342  NA 141 144 139 137 135  K 3.3* 3.4* 3.4* 3.6 4.5  CL 104  107 101 102 101  CO2 26 26 28 26 25   GLUCOSE 106* 124* 147* 152* 171*  BUN 17 14 11 10 17   CREATININE 1.34* 1.00 0.94 1.05 1.06  CALCIUM  9.5 9.1 8.8* 9.0 9.5  MG  --  1.3* 1.7 1.6* 1.8  PHOS  --   --   --   --  3.4    CBG: Recent Labs  Lab 09/21/23 2052 09/22/23 0304 09/22/23 0754 09/22/23 1107 09/22/23 1627  GLUCAP 194* 169* 174* 214* 192*    Recent Results (from the past 240 hours)  Resp panel by RT-PCR (RSV, Flu A&B, Covid) Anterior Nasal Swab     Status: None   Collection Time: 09/16/23 10:18 AM   Specimen: Anterior Nasal Swab  Result Value Ref Range Status   SARS Coronavirus 2 by RT PCR NEGATIVE NEGATIVE Final    Comment: (NOTE) SARS-CoV-2 target nucleic acids are NOT DETECTED.  The SARS-CoV-2 RNA is generally detectable in upper respiratory specimens during the acute phase of infection. The lowest concentration of SARS-CoV-2 viral copies this assay can detect is 138 copies/mL. A negative result does not preclude SARS-Cov-2 infection and should not be used as the sole basis for treatment or other patient management decisions. A negative result may occur with  improper specimen collection/handling,  submission of specimen other than nasopharyngeal swab, presence of viral mutation(s) within the areas targeted by this assay, and inadequate number of viral copies(<138 copies/mL). A negative result must be combined with clinical observations, patient history, and epidemiological information. The expected result is Negative.  Fact Sheet for Patients:  bloggercourse.com  Fact Sheet for Healthcare Providers:  seriousbroker.it  This test is no t yet approved or cleared by the [place]  FDA and  has been authorized for detection and/or diagnosis of SARS-CoV-2 by FDA under an Emergency Use Authorization (EUA). This EUA will remain  in effect (meaning this test can be used) for the duration of the COVID-19 declaration  under Section 564(b)(1) of the Act, 21 U.S.C.section 360bbb-3(b)(1), unless the authorization is terminated  or revoked sooner.       Influenza A by PCR NEGATIVE NEGATIVE Final   Influenza B by PCR NEGATIVE NEGATIVE Final    Comment: (NOTE) The Xpert Xpress SARS-CoV-2/FLU/RSV plus assay is intended as an aid in the diagnosis of influenza from Nasopharyngeal swab specimens and should not be used as a sole basis for treatment. Nasal washings and aspirates are unacceptable for Xpert Xpress SARS-CoV-2/FLU/RSV testing.  Fact Sheet for Patients: bloggercourse.com  Fact Sheet for Healthcare Providers: seriousbroker.it  This test is not yet approved or cleared by the [place]  FDA and has been authorized for detection and/or diagnosis of SARS-CoV-2 by FDA under an Emergency Use Authorization (EUA). This EUA will remain in effect (meaning this test can be used) for the duration of the COVID-19 declaration under Section 564(b)(1) of the Act, 21 U.S.C. section 360bbb-3(b)(1), unless the authorization is terminated or revoked.     Resp Syncytial Virus by PCR NEGATIVE NEGATIVE Final    Comment: (NOTE) Fact Sheet for Patients: bloggercourse.com  Fact Sheet for Healthcare Providers: seriousbroker.it  This test is not yet approved or cleared by the [place]  FDA and has been authorized for detection and/or diagnosis of SARS-CoV-2 by FDA under an Emergency Use Authorization (EUA). This EUA will remain in effect (meaning this test can be used) for the duration of the COVID-19 declaration under Section 564(b)(1) of the Act, 21 U.S.C. section 360bbb-3(b)(1), unless the authorization is terminated or revoked.  Performed at [facility], [address] [place], [place] [address]   Blood culture (routine x 2)     Status: None   Collection Time: 09/16/23 10:20 AM   Specimen: BLOOD   Result Value Ref Range Status   Specimen Description BLOOD BLOOD RIGHT ARM FOREARM  Final   Special Requests   Final    BOTTLES DRAWN AEROBIC AND ANAEROBIC Blood Culture adequate volume   Culture   Final    NO GROWTH 5 DAYS Performed at [facility], [address], [place], [place] [address]    Report Status 09/21/2023 FINAL  Final  Blood culture (routine x 2)     Status: None   Collection Time: 09/16/23 10:20 AM   Specimen: BLOOD  Result Value Ref Range Status   Specimen Description BLOOD BLOOD RIGHT ARM RAC  Final   Special Requests   Final    BOTTLES DRAWN AEROBIC AND ANAEROBIC Blood Culture adequate volume   Culture   Final    NO GROWTH 5 DAYS Performed at [facility], [address] [place], [place] [address]    Report Status 09/21/2023 FINAL  Final   Radiology Studies: No results found.  Scheduled Meds:  atorvastatin   80 mg Oral q1800   cyanocobalamin   1,000 mcg  Oral Daily   docusate sodium   100 mg Oral BID   enoxaparin  (LOVENOX ) injection  40 mg Subcutaneous Q24H   fluticasone  furoate-vilanterol  1 puff Inhalation Daily   furosemide   40 mg Oral Daily   insulin  aspart  0-15 Units Subcutaneous TID WC   ipratropium-albuterol   3 mL Nebulization TID   metoprolol  tartrate  37.5 mg Oral BID   mirtazapine   15 mg Oral QHS   multivitamin with minerals  1 tablet Oral Daily   naltrexone   50 mg Oral Daily   omega-3 acid ethyl esters  1,000 mg Oral Daily   pantoprazole   40 mg Oral Daily   potassium chloride  SA  20 mEq Oral BID   risperiDONE   1 mg Oral QHS   tamsulosin   0.4 mg Oral Daily   thiamine   250 mg Oral Daily   Continuous Infusions:   LOS: 6 days   Rendall Carwin, MD How to contact the [facility] Attending or Consulting provider 7A - 7P or covering provider during after hours 7P -7A, for this patient?   09/22/2023, 5:44 PM

## 2023-09-23 ENCOUNTER — Inpatient Hospital Stay (HOSPITAL_COMMUNITY): Payer: HMO

## 2023-09-23 DIAGNOSIS — R509 Fever, unspecified: Secondary | ICD-10-CM

## 2023-09-23 DIAGNOSIS — J449 Chronic obstructive pulmonary disease, unspecified: Secondary | ICD-10-CM

## 2023-09-23 LAB — CBC
HCT: 34.1 % — ABNORMAL LOW (ref 39.0–52.0)
Hemoglobin: 10.4 g/dL — ABNORMAL LOW (ref 13.0–17.0)
MCH: 26.5 pg (ref 26.0–34.0)
MCHC: 30.5 g/dL (ref 30.0–36.0)
MCV: 87 fL (ref 80.0–100.0)
Platelets: 286 10*3/uL (ref 150–400)
RBC: 3.92 MIL/uL — ABNORMAL LOW (ref 4.22–5.81)
RDW: 15 % (ref 11.5–15.5)
WBC: 7.3 10*3/uL (ref 4.0–10.5)
nRBC: 0 % (ref 0.0–0.2)

## 2023-09-23 LAB — RESPIRATORY PANEL BY PCR

## 2023-09-23 LAB — SARS CORONAVIRUS 2 BY RT PCR: SARS Coronavirus 2 by RT PCR: NEGATIVE

## 2023-09-23 LAB — GLUCOSE, CAPILLARY
Glucose-Capillary: 172 mg/dL — ABNORMAL HIGH (ref 70–99)
Glucose-Capillary: 178 mg/dL — ABNORMAL HIGH (ref 70–99)
Glucose-Capillary: 223 mg/dL — ABNORMAL HIGH (ref 70–99)
Glucose-Capillary: 187 mg/dL — ABNORMAL HIGH (ref 70–99)
Glucose-Capillary: 204 mg/dL — ABNORMAL HIGH (ref 70–99)

## 2023-09-23 LAB — CREATININE, SERUM
Creatinine, Ser: 1.41 mg/dL — ABNORMAL HIGH (ref 0.61–1.24)
GFR, Estimated: 53 mL/min — ABNORMAL LOW (ref >60)

## 2023-09-23 LAB — LACTIC ACID, PLASMA: Lactic Acid, Venous: 1.1 mmol/L (ref 0.5–1.9)

## 2023-09-23 MED ORDER — GLIPIZIDE 5 MG PO TABS
2.5000 mg | ORAL_TABLET | Freq: Every day | ORAL | Status: DC
  Administered 2023-09-24 – 2023-09-27 (×4): 2.5 mg via ORAL
  Filled 2023-09-23 (×4): qty 1

## 2023-09-23 MED ORDER — METOPROLOL TARTRATE 50 MG PO TABS
50.0000 mg | ORAL_TABLET | Freq: Two times a day (BID) | ORAL | Status: DC
  Administered 2023-09-23 – 2023-09-30 (×14): 50 mg via ORAL
  Filled 2023-09-23 (×14): qty 1

## 2023-09-23 NOTE — Progress Notes (Signed)
 PROGRESS NOTE   Jeff Miles  FMW:[id number] DOB: Feb 24, 1951 DOA: 09/16/2023 PCP: [name], MD   Chief Complaint  Patient presents with   Altered Mental Status   Level of care: Med-Surg  Brief Admission History:  73 year old male with advanced dementia associated with history of alcohol  abuse and Korsakoff's encephalopathy, hypertension, COPD, former longtime smoker, iron  deficiency anemia, type 2 diabetes mellitus, GAD, GERD, BPH with urinary retention, failure to thrive, frequent falls has had several recent hospitalizations most recently discharged on 09/03/2023 after being hospitalized for urinary retention, acute on chronic respiratory failure and metabolic encephalopathy and he was discharged home with home health services with his wife as primary caretaker.  She reports that for the past several days he has had increasing confusion and wandering at night and falling multiple times at home.  He had canceled his outpatient appointment to have a colonoscopy on 09/28/2023.  This was part of a workup for guaiac positive stools and recent colonoscopy was incomplete due to poor prep.  Wife reports that it has been unsafe for him to remain at home because she is unable to care for him due to his frequent falls and nighttime wandering behavior.  She reports that he fell out of a chair this morning and struck his head on the floor.  He was discharged with a Foley catheter but has since been removed and he has been voiding per wife.  She reports that she is unable to care for him at home in his current state.  His ED trauma workup has been negative for acute findings.  He is being admitted for frequent falls and encephalopathy.   Disposition:- Peer to peer review with [employer] 09/20/23: declined for SNF, recommend LTC or memory care -Awaiting insurance authorization/payer source for long-term memory unit--- Medicaid application in progress -Remains medically stable for discharge to  SNF facility when SNF bed can be found and insurance authorization/payer source is available  Assessment and Plan:  1)Acute Metabolic Encephalopathy - RESOLVED  - Patient appears to have underlying advanced dementia -No acute metabolic abnormalities found -Received thiamine  -c/n Fall precautions     2)Frequent Falls -Trauma workup in the ED negative for acute findings -Fall precautions recommended -PT/OT evaluation requested -- SNF recommended ---insurance declines SNF recommends long-term care memory unit   3)Generalized weakness and adult failure to thrive/social/ethics -Family tried caring for him at home but it has become too difficult for the wife to manage alone -Wife did have home health support but this has not been enough -Patient will need placement in a memory care facility Vs SNF -Wife reports home situation unsafe for patient at this time -Palliative care consult appreciated, patient is a DNR/DNI  4)Urinary retention -Foley catheter has been removed -Catheterize if unable to void -c/n  home tamsulosin  daily   5)Hypotension - RESOLVED  - Continue metoprolol  37.5 mg twice daily-- Prior to admission patient was on 50 mg twice daily -Improved some with IV fluids which have completed  -Encourage adequate oral intake   6)COPD with chronic bronchospasm -c/n  home bronchodilators -Scheduled bronchodilators ordered via nebulizer   7)Rectal bleeding with guaiac positive stool -Likely secondary to hemorrhoids seen on colonoscopy -Has been unable to have a colonoscopy due to poor prep and family recently canceled the outpatient procedure that was scheduled for 09/28/2023.   8)DM2-.  A1c 5.5 reflecting excellent diabetic control PTA -Holding PTA metformin -Okay to restart glipizide  at 2.5 mg with breakfast Use Novolog /Humalog Sliding scale insulin   with Accu-Cheks/Fingersticks as ordered   9)Hypokalemia/hypomagnesemia - Replaced  10)Transient Fever--- patient had a temp  of 100.5 overnight. (09/23/23) ---.SABRA No further fevers at this time -Chest x-ray without acute findings -COVID-negative and no leukocytosis -Blood cultures were sent -UA requested and pending  DVT prophylaxis: enoxaparin  Code Status: DNR Family Communication: Wife is primary contact  disposition: LTC/memory care  Peer to peer review with [employer] 09/20/23: declined for SNF, recommend LTC or memory care  Consultants:  PT/OT   Subjective:  -Had a temp of 100.5 overnight --No further fevers at this time - Weakness and fatigue persist  -Remains medically stable for discharge to SNF facility when SNF bed can be found and insurance authorization/payer source is available  Objective: Vitals:   09/23/23 0851 09/23/23 0859 09/23/23 1146 09/23/23 1440  BP:  139/67  126/68  Pulse:  93  87  Resp:  (!) 27  (!) 22  Temp:  (!) 100.8 F (38.2 C) 100 F (37.8 C)   TempSrc:  Axillary Axillary   SpO2: 94% 100%  97%  Weight:      Height:        Intake/Output Summary (Last 24 hours) at 09/23/2023 1446 Last data filed at 09/23/2023 1330 Gross per 24 hour  Intake 680 ml  Output 1400 ml  Net -720 ml    Filed Weights   09/21/23 0500 09/22/23 0521 09/23/23 0526  Weight: 90 kg 88.2 kg 89.7 kg   Physical Exam Gen:- Awake Alert, in no acute distress  HEENT:- [place].AT, No sclera icterus Nose--[place] 3L/min Ears---HOH Neck-Supple Neck,No JVD,.  Lungs-  CTAB , fair air movement bilaterally  CV- S1, S2 normal, RRR Abd-  +ve B.Sounds, Abd Soft, No tenderness,    Extremity/Skin:-Scaly dry skin on legs, chronic venous stasis , good pedal pulses  Psych--pleasantly confused, obvious cognitive and memory deficits Neuro-generalized weakness, no new focal deficits, no tremors  Data Reviewed: I have personally reviewed following labs and imaging studies  CBC: Recent Labs  Lab 09/17/23 0405 09/22/23 0342 09/23/23 0112  WBC 9.8 7.7 7.3  NEUTROABS 7.6  --   --   HGB 9.0* 9.5* 10.4*   HCT 30.8* 32.7* 34.1*  MCV 90.1 88.4 87.0  PLT 346 287 286    Basic Metabolic Panel: Recent Labs  Lab 09/17/23 0405 09/18/23 0429 09/19/23 0504 09/22/23 0342 09/23/23 0112  NA 144 139 137 135  --   K 3.4* 3.4* 3.6 4.5  --   CL 107 101 102 101  --   CO2 26 28 26 25   --   GLUCOSE 124* 147* 152* 171*  --   BUN 14 11 10 17   --   CREATININE 1.00 0.94 1.05 1.06 1.41*  CALCIUM  9.1 8.8* 9.0 9.5  --   MG 1.3* 1.7 1.6* 1.8  --   PHOS  --   --   --  3.4  --    CBG: Recent Labs  Lab 09/22/23 1627 09/22/23 2223 09/23/23 0255 09/23/23 0827 09/23/23 1201  GLUCAP 192* 190* 172* 178* 223*    Recent Results (from the past 240 hours)  Resp panel by RT-PCR (RSV, Flu A&B, Covid) Anterior Nasal Swab     Status: None   Collection Time: 09/16/23 10:18 AM   Specimen: Anterior Nasal Swab  Result Value Ref Range Status   SARS Coronavirus 2 by RT PCR NEGATIVE NEGATIVE Final    Comment: (NOTE) SARS-CoV-2 target nucleic acids are NOT DETECTED.  The SARS-CoV-2 RNA is generally detectable  in upper respiratory specimens during the acute phase of infection. The lowest concentration of SARS-CoV-2 viral copies this assay can detect is 138 copies/mL. A negative result does not preclude SARS-Cov-2 infection and should not be used as the sole basis for treatment or other patient management decisions. A negative result may occur with  improper specimen collection/handling, submission of specimen other than nasopharyngeal swab, presence of viral mutation(s) within the areas targeted by this assay, and inadequate number of viral copies(<138 copies/mL). A negative result must be combined with clinical observations, patient history, and epidemiological information. The expected result is Negative.  Fact Sheet for Patients:  bloggercourse.com  Fact Sheet for Healthcare Providers:  seriousbroker.it  This test is no t yet approved or cleared by the  [place]  FDA and  has been authorized for detection and/or diagnosis of SARS-CoV-2 by FDA under an Emergency Use Authorization (EUA). This EUA will remain  in effect (meaning this test can be used) for the duration of the COVID-19 declaration under Section 564(b)(1) of the Act, 21 U.S.C.section 360bbb-3(b)(1), unless the authorization is terminated  or revoked sooner.       Influenza A by PCR NEGATIVE NEGATIVE Final   Influenza B by PCR NEGATIVE NEGATIVE Final    Comment: (NOTE) The Xpert Xpress SARS-CoV-2/FLU/RSV plus assay is intended as an aid in the diagnosis of influenza from Nasopharyngeal swab specimens and should not be used as a sole basis for treatment. Nasal washings and aspirates are unacceptable for Xpert Xpress SARS-CoV-2/FLU/RSV testing.  Fact Sheet for Patients: bloggercourse.com  Fact Sheet for Healthcare Providers: seriousbroker.it  This test is not yet approved or cleared by the [place]  FDA and has been authorized for detection and/or diagnosis of SARS-CoV-2 by FDA under an Emergency Use Authorization (EUA). This EUA will remain in effect (meaning this test can be used) for the duration of the COVID-19 declaration under Section 564(b)(1) of the Act, 21 U.S.C. section 360bbb-3(b)(1), unless the authorization is terminated or revoked.     Resp Syncytial Virus by PCR NEGATIVE NEGATIVE Final    Comment: (NOTE) Fact Sheet for Patients: bloggercourse.com  Fact Sheet for Healthcare Providers: seriousbroker.it  This test is not yet approved or cleared by the [place]  FDA and has been authorized for detection and/or diagnosis of SARS-CoV-2 by FDA under an Emergency Use Authorization (EUA). This EUA will remain in effect (meaning this test can be used) for the duration of the COVID-19 declaration under Section 564(b)(1) of the Act, 21 U.S.C. section  360bbb-3(b)(1), unless the authorization is terminated or revoked.  Performed at [facility], [address] [place], [place] [address]   Blood culture (routine x 2)     Status: None   Collection Time: 09/16/23 10:20 AM   Specimen: BLOOD  Result Value Ref Range Status   Specimen Description BLOOD BLOOD RIGHT ARM FOREARM  Final   Special Requests   Final    BOTTLES DRAWN AEROBIC AND ANAEROBIC Blood Culture adequate volume   Culture   Final    NO GROWTH 5 DAYS Performed at [facility], [address], [place], [place] [address]    Report Status 09/21/2023 FINAL  Final  Blood culture (routine x 2)     Status: None   Collection Time: 09/16/23 10:20 AM   Specimen: BLOOD  Result Value Ref Range Status   Specimen Description BLOOD BLOOD RIGHT ARM RAC  Final   Special Requests   Final    BOTTLES DRAWN AEROBIC AND ANAEROBIC Blood Culture  adequate volume   Culture   Final    NO GROWTH 5 DAYS Performed at [facility], [address] [place], [place] [address]    Report Status 09/21/2023 FINAL  Final  Respiratory (~20 pathogens) panel by PCR     Status: None   Collection Time: 09/23/23 12:14 AM   Specimen: Nasopharyngeal Swab; Respiratory  Result Value Ref Range Status   Adenovirus NOT DETECTED NOT DETECTED Final   Coronavirus 229E NOT DETECTED NOT DETECTED Final    Comment: (NOTE) The Coronavirus on the Respiratory Panel, DOES NOT test for the novel  Coronavirus (2019 nCoV)    Coronavirus HKU1 NOT DETECTED NOT DETECTED Final   Coronavirus NL63 NOT DETECTED NOT DETECTED Final   Coronavirus OC43 NOT DETECTED NOT DETECTED Final   Metapneumovirus NOT DETECTED NOT DETECTED Final   Rhinovirus / Enterovirus NOT DETECTED NOT DETECTED Final   Influenza A NOT DETECTED NOT DETECTED Final   Influenza B NOT DETECTED NOT DETECTED Final   Parainfluenza Virus 1 NOT DETECTED NOT DETECTED Final   Parainfluenza Virus 2 NOT DETECTED NOT DETECTED Final   Parainfluenza Virus 3 NOT DETECTED NOT  DETECTED Final   Parainfluenza Virus 4 NOT DETECTED NOT DETECTED Final   Respiratory Syncytial Virus NOT DETECTED NOT DETECTED Final   Bordetella pertussis NOT DETECTED NOT DETECTED Final   Bordetella Parapertussis NOT DETECTED NOT DETECTED Final   Chlamydophila pneumoniae NOT DETECTED NOT DETECTED Final   Mycoplasma pneumoniae NOT DETECTED NOT DETECTED Final    Comment: Performed at [facility], [address] [place], [place] [address]  SARS Coronavirus 2 by RT PCR (hospital order, performed in [facility] hospital lab) *cepheid single result test* Anterior Nasal Swab     Status: None   Collection Time: 09/23/23 12:16 AM   Specimen: Anterior Nasal Swab  Result Value Ref Range Status   SARS Coronavirus 2 by RT PCR NEGATIVE NEGATIVE Final    Comment: (NOTE) SARS-CoV-2 target nucleic acids are NOT DETECTED.  The SARS-CoV-2 RNA is generally detectable in upper and lower respiratory specimens during the acute phase of infection. The lowest concentration of SARS-CoV-2 viral copies this assay can detect is 250 copies / mL. A negative result does not preclude SARS-CoV-2 infection and should not be used as the sole basis for treatment or other patient management decisions.  A negative result may occur with improper specimen collection / handling, submission of specimen other than nasopharyngeal swab, presence of viral mutation(s) within the areas targeted by this assay, and inadequate number of viral copies (<250 copies / mL). A negative result must be combined with clinical observations, patient history, and epidemiological information.  Fact Sheet for Patients:   roadlaptop.co.za  Fact Sheet for Healthcare Providers: http://kim-miller.com/  This test is not yet approved or  cleared by the [place]  FDA and has been authorized for detection and/or diagnosis of SARS-CoV-2 by FDA under an Emergency Use Authorization (EUA).  This EUA  will remain in effect (meaning this test can be used) for the duration of the COVID-19 declaration under Section 564(b)(1) of the Act, 21 U.S.C. section 360bbb-3(b)(1), unless the authorization is terminated or revoked sooner.  Performed at [facility] [address], [place], [place] [address]   Culture, blood (Routine X 2) w Reflex to ID Panel     Status: None (Preliminary result)   Collection Time: 09/23/23  1:09 AM   Specimen: BLOOD  Result Value Ref Range Status   Specimen Description BLOOD BLOOD RIGHT  ARM  Final   Special Requests   Final    BOTTLES DRAWN AEROBIC AND ANAEROBIC Blood Culture adequate volume   Culture   Final    NO GROWTH < 12 HOURS Performed at [facility], [address], [place], [place] [address]    Report Status PENDING  Incomplete  Culture, blood (Routine X 2) w Reflex to ID Panel     Status: None (Preliminary result)   Collection Time: 09/23/23  1:12 AM   Specimen: BLOOD  Result Value Ref Range Status   Specimen Description BLOOD BLOOD RIGHT ARM  Final   Special Requests   Final    BOTTLES DRAWN AEROBIC AND ANAEROBIC Blood Culture adequate volume   Culture   Final    NO GROWTH < 12 HOURS Performed at [facility], [address] [place], [place] [address]    Report Status PENDING  Incomplete   Radiology Studies: DG Chest 1 View Result Date: 09/23/2023 CLINICAL DATA:  Fevers EXAM: PORTABLE CHEST 1 VIEW COMPARISON:  09/16/2023 FINDINGS: The heart size and mediastinal contours are within normal limits. Both lungs are clear. The visualized skeletal structures are unremarkable. IMPRESSION: No active disease. Electronically Signed   By: Oneil Devonshire M.D.   On: 09/23/2023 00:37   Scheduled Meds:  atorvastatin   80 mg Oral q1800   cyanocobalamin   1,000 mcg Oral Daily   docusate sodium   100 mg Oral BID   enoxaparin  (LOVENOX ) injection  40 mg Subcutaneous Q24H   fluticasone  furoate-vilanterol  1 puff Inhalation Daily   furosemide   40 mg Oral Daily    insulin  aspart  0-15 Units Subcutaneous TID WC   ipratropium-albuterol   3 mL Nebulization TID   metoprolol  tartrate  37.5 mg Oral BID   mirtazapine   15 mg Oral QHS   multivitamin with minerals  1 tablet Oral Daily   naltrexone   50 mg Oral Daily   omega-3 acid ethyl esters  1,000 mg Oral Daily   pantoprazole   40 mg Oral Daily   potassium chloride  SA  20 mEq Oral BID   risperiDONE   1 mg Oral QHS   tamsulosin   0.4 mg Oral Daily   thiamine   250 mg Oral Daily   Continuous Infusions:   LOS: 7 days   Rendall Carwin, MD How to contact the [facility] Attending or Consulting provider 7A - 7P or covering provider during after hours 7P -7A, for this patient?   09/23/2023, 2:46 PM

## 2023-09-23 NOTE — Plan of Care (Signed)
 Pt is alert to self. HOH. Causes issues with following commands. Pt has had low grade temps on and off on 1/9. Congested airway. MD aware and cxray completed. Resp panel complete. Covid swab completed and blood cultures done. Heart is elevated from 90-120's. Vitals improving.  Problem: Clinical Measurements: Goal: Respiratory complications will improve Outcome: Not Progressing Goal: Cardiovascular complication will be avoided Outcome: Not Progressing   Problem: Education: Goal: Knowledge of General Education information will improve Description: Including pain rating scale, medication(s)/side effects and non-pharmacologic comfort measures Outcome: Progressing   Problem: Health Behavior/Discharge Planning: Goal: Ability to manage health-related needs will improve Outcome: Progressing   Problem: Clinical Measurements: Goal: Ability to maintain clinical measurements within normal limits will improve Outcome: Progressing Goal: Will remain free from infection Outcome: Progressing Goal: Diagnostic test results will improve Outcome: Progressing   Problem: Activity: Goal: Risk for activity intolerance will decrease Outcome: Progressing   Problem: Nutrition: Goal: Adequate nutrition will be maintained Outcome: Progressing   Problem: Coping: Goal: Level of anxiety will decrease Outcome: Progressing   Problem: Elimination: Goal: Will not experience complications related to bowel motility Outcome: Progressing Goal: Will not experience complications related to urinary retention Outcome: Progressing   Problem: Pain Management: Goal: General experience of comfort will improve Outcome: Progressing   Problem: Safety: Goal: Ability to remain free from injury will improve Outcome: Progressing   Problem: Skin Integrity: Goal: Risk for impaired skin integrity will decrease Outcome: Progressing   Problem: Education: Goal: Ability to describe self-care measures that may prevent or  decrease complications (Diabetes Survival Skills Education) will improve Outcome: Progressing Goal: Individualized Educational Video(s) Outcome: Progressing   Problem: Coping: Goal: Ability to adjust to condition or change in health will improve Outcome: Progressing   Problem: Fluid Volume: Goal: Ability to maintain a balanced intake and output will improve Outcome: Progressing   Problem: Health Behavior/Discharge Planning: Goal: Ability to identify and utilize available resources and services will improve Outcome: Progressing Goal: Ability to manage health-related needs will improve Outcome: Progressing   Problem: Metabolic: Goal: Ability to maintain appropriate glucose levels will improve Outcome: Progressing   Problem: Nutritional: Goal: Maintenance of adequate nutrition will improve Outcome: Progressing Goal: Progress toward achieving an optimal weight will improve Outcome: Progressing   Problem: Skin Integrity: Goal: Risk for impaired skin integrity will decrease Outcome: Progressing   Problem: Tissue Perfusion: Goal: Adequacy of tissue perfusion will improve Outcome: Progressing

## 2023-09-23 NOTE — TOC Progression Note (Signed)
 Transition of Care [facility]) - Progression Note    Patient Details  Name: Jeff Miles MRN: [id number] Date of Birth: 02-21-1951  Transition of [facility]) CM/SW Contact  Rollo Petri, LCSW Phone Number: 09/23/2023, 11:58 AM  Clinical Narrative:     TOC following. Debbie from [facility] came to assess pt and stated that they could likely take him next week when they have a bed available IF LOG offered.   Updated TOC leadership. Will follow.  Expected Discharge Plan: Skilled Nursing Facility Barriers to Discharge: No SNF bed  Expected Discharge Plan and Services In-house Referral: Clinical Social Work   Post Acute Care Choice: Skilled Nursing Facility Living arrangements for the past 2 months: Single Family Home                                       Social Determinants of Health (SDOH) Interventions SDOH Screenings   Food Insecurity: No Food Insecurity (09/16/2023)  Housing: Low Risk  (09/16/2023)  Transportation Needs: No Transportation Needs (09/16/2023)  Utilities: Not At Risk (09/16/2023)  Social Connections: Patient Unable To Answer (09/16/2023)  Tobacco Use: Medium Risk (09/16/2023)    Readmission Risk Interventions    09/17/2023    4:45 PM  Readmission Risk Prevention Plan  Transportation Screening Complete  PCP or Specialist Appt within 3-5 Days Complete  HRI or Home Care Consult Complete  Social Work Consult for Recovery Care Planning/Counseling Complete  Palliative Care Screening Not Applicable  Medication Review Oceanographer) Complete

## 2023-09-23 NOTE — Progress Notes (Signed)
 Progress note  RN called due to patient spiking fever at 100.5 F, respiratory rate was 24, pulse 124, BP 159/76 and O2 sat was 95% on supplemental oxygen at 4 LPM. At bedside, patient was ill-appearing, HOH and on auscultation, he presented with diffuse rhonchi and mild wheezing. On review of patient's chart, prior labs showed no leukocytosis.  Respiratory panel was ordered, SARS coronavirus 2 ordered was negative. CBC ordered showed no leukocytosis.  Chest x-ray showed no active disease, lactic acid was normal.  Blood culture was pending. Tylenol  was given for acute febrile illness and fever has resolved with temperature of 98.40F Continue aspiration precautions Continue bronchodilators for COPD with chronic bronchospasm Continue to monitor patient and treat accordingly  Please refer to admission H&P, progress notes for details regarding the care of this patient  Total time:  31 minutes This includes time reviewing the chart including progress notes, labs, EKGs, taking medical decisions, ordering labs and documenting findings.

## 2023-09-24 LAB — URINALYSIS, W/ REFLEX TO CULTURE (INFECTION SUSPECTED)
Bilirubin Urine: NEGATIVE
Glucose, UA: NEGATIVE mg/dL
Ketones, ur: NEGATIVE mg/dL
Nitrite: POSITIVE — AB
Protein, ur: 100 mg/dL — AB
Specific Gravity, Urine: 1.017 (ref 1.005–1.030)
pH: 5 (ref 5.0–8.0)

## 2023-09-24 LAB — GLUCOSE, CAPILLARY
Glucose-Capillary: 199 mg/dL — ABNORMAL HIGH (ref 70–99)
Glucose-Capillary: 203 mg/dL — ABNORMAL HIGH (ref 70–99)
Glucose-Capillary: 190 mg/dL — ABNORMAL HIGH (ref 70–99)
Glucose-Capillary: 166 mg/dL — ABNORMAL HIGH (ref 70–99)
Glucose-Capillary: 255 mg/dL — ABNORMAL HIGH (ref 70–99)

## 2023-09-24 MED ORDER — SODIUM CHLORIDE 0.9 % IV SOLN
1.0000 g | INTRAVENOUS | Status: AC
  Administered 2023-09-24 – 2023-09-26 (×3): 1 g via INTRAVENOUS
  Filled 2023-09-24 (×3): qty 10

## 2023-09-24 MED ORDER — ALBUTEROL SULFATE (2.5 MG/3ML) 0.083% IN NEBU
2.5000 mg | INHALATION_SOLUTION | RESPIRATORY_TRACT | Status: DC | PRN
  Administered 2023-09-27: 2.5 mg via RESPIRATORY_TRACT
  Filled 2023-09-24: qty 3

## 2023-09-24 NOTE — Plan of Care (Signed)
   Problem: Activity: Goal: Risk for activity intolerance will decrease Outcome: Progressing   Problem: Nutrition: Goal: Adequate nutrition will be maintained Outcome: Progressing   Problem: Elimination: Goal: Will not experience complications related to bowel motility Outcome: Progressing   Problem: Skin Integrity: Goal: Risk for impaired skin integrity will decrease Outcome: Progressing

## 2023-09-24 NOTE — Progress Notes (Signed)
 PROGRESS NOTE   Jeff Miles  FMW:[id number] DOB: 1951-01-30 DOA: 09/16/2023 PCP: [name], MD   Chief Complaint  Patient presents with   Altered Mental Status   Level of care: Med-Surg  Brief Admission History:  73 year old male with advanced dementia associated with history of alcohol  abuse and Korsakoff's encephalopathy, hypertension, COPD, former longtime smoker, iron  deficiency anemia, type 2 diabetes mellitus, GAD, GERD, BPH with urinary retention, failure to thrive, frequent falls has had several recent hospitalizations most recently discharged on 09/03/2023 after being hospitalized for urinary retention, acute on chronic respiratory failure and metabolic encephalopathy and he was discharged home with home health services with his wife as primary caretaker.  She reports that for the past several days he has had increasing confusion and wandering at night and falling multiple times at home.  He had canceled his outpatient appointment to have a colonoscopy on 09/28/2023.  This was part of a workup for guaiac positive stools and recent colonoscopy was incomplete due to poor prep.  Wife reports that it has been unsafe for him to remain at home because she is unable to care for him due to his frequent falls and nighttime wandering behavior.  She reports that he fell out of a chair this morning and struck his head on the floor.  He was discharged with a Foley catheter but has since been removed and he has been voiding per wife.  She reports that she is unable to care for him at home in his current state.  His ED trauma workup has been negative for acute findings.  He is being admitted for frequent falls and encephalopathy.   Disposition:- Peer to peer review with [employer] 09/20/23: declined for SNF, recommend LTC or memory care -Awaiting insurance authorization/payer source for long-term memory unit--- Medicaid application in progress -Remains medically stable for discharge to  SNF facility when SNF bed can be found and insurance authorization/payer source is available  Assessment and Plan:  1)Acute Metabolic Encephalopathy - RESOLVED  - Patient appears to have underlying advanced dementia -No acute metabolic abnormalities found -Received thiamine  -c/n Fall precautions     2)Frequent Falls -Trauma workup in the ED negative for acute findings -Fall precautions recommended -PT/OT evaluation requested -- SNF recommended ---insurance declines SNF recommends long-term care memory unit   3)Generalized weakness and adult failure to thrive/social/ethics -Family tried caring for him at home but it has become too difficult for the wife to manage alone -Wife did have home health support but this has not been enough -Patient will need placement in a memory care facility Vs SNF -Wife reports home situation unsafe for patient at this time -Palliative care consult appreciated, patient is a DNR/DNI  4)Urinary retention -Foley catheter has been removed -Appears to be voiding well -c/n  home tamsulosin  daily   5)Hypotension - RESOLVED  - Continue metoprolol  37.5 mg twice daily-- Prior to admission patient was on 50 mg twice daily -Improved some with IV fluids which have completed  -Encourage adequate oral intake   6)COPD with chronic bronchospasm -c/n  home bronchodilators -Scheduled bronchodilators ordered via nebulizer   7)Rectal bleeding with guaiac positive stool -Likely secondary to hemorrhoids seen on colonoscopy -Has been unable to have a colonoscopy due to poor prep and family recently canceled the outpatient procedure that was scheduled for 09/28/2023.   8)DM2-.  A1c 5.5 reflecting excellent diabetic control PTA -Holding PTA metformin -Okay to restart glipizide  at 2.5 mg with breakfast Use Novolog /Humalog Sliding scale insulin   with Accu-Cheks/Fingersticks as ordered   9)Hypokalemia/hypomagnesemia - Replaced  10) presumed UTI ----.SABRA No further fevers  at this time -UA suggestive of UTI -Chest x-ray without acute findings -COVID-negative and no leukocytosis -IV Rocephin  pending blood and urine culture data -At discharge may change to oral antibiotics  11) acute hypoxic respiratory failure--- chest x-ray from 09/22/2022 without acute findings -Afebrile -Attempted to wean off O2 but sats dropped to 87% on room air placed back on 2 L at this time -Continue bronchodilators   DVT prophylaxis: enoxaparin  Code Status: DNR Family Communication: Wife is primary contact  disposition: LTC/memory care  Peer to peer review with [employer] 09/20/23: declined for SNF, recommend LTC or memory care  Consultants:  PT/OT   Subjective:  -Afebrile Weakness and fatigue persist  -Remains medically stable for discharge to SNF facility when SNF bed can be found and insurance authorization/payer source is available  Objective: Vitals:   09/24/23 0500 09/24/23 0748 09/24/23 0752 09/24/23 0826  BP:    (!) 142/73  Pulse:    (!) 103  Resp:    (!) 26  Temp: 99.8 F (37.7 C)   98.8 F (37.1 C)  TempSrc:    Oral  SpO2:  97% 97% 94%  Weight:      Height:        Intake/Output Summary (Last 24 hours) at 09/24/2023 1011 Last data filed at 09/24/2023 0300 Gross per 24 hour  Intake 240 ml  Output 1200 ml  Net -960 ml    Filed Weights   09/22/23 0521 09/23/23 0526 09/24/23 0417  Weight: 88.2 kg 89.7 kg 88.5 kg   Physical Exam Gen:- Awake Alert, in no acute distress  HEENT:- [place].AT, No sclera icterus Nose- [place] 2L/min Ears---HOH Neck-Supple Neck,No JVD,.  Lungs-  CTAB , fair air movement bilaterally  CV- S1, S2 normal, RRR Abd-  +ve B.Sounds, Abd Soft, No tenderness, no CVA area tenderness    Extremity/Skin:-Scaly dry skin on legs, chronic venous stasis , good pedal pulses  Psych--pleasantly confused, obvious cognitive and memory deficits Neuro-generalized weakness, no new focal deficits, no tremors  Data Reviewed: I have personally  reviewed following labs and imaging studies  CBC: Recent Labs  Lab 09/22/23 0342 09/23/23 0112  WBC 7.7 7.3  HGB 9.5* 10.4*  HCT 32.7* 34.1*  MCV 88.4 87.0  PLT 287 286    Basic Metabolic Panel: Recent Labs  Lab 09/18/23 0429 09/19/23 0504 09/22/23 0342 09/23/23 0112  NA 139 137 135  --   K 3.4* 3.6 4.5  --   CL 101 102 101  --   CO2 28 26 25   --   GLUCOSE 147* 152* 171*  --   BUN 11 10 17   --   CREATININE 0.94 1.05 1.06 1.41*  CALCIUM  8.8* 9.0 9.5  --   MG 1.7 1.6* 1.8  --   PHOS  --   --  3.4  --    CBG: Recent Labs  Lab 09/23/23 1201 09/23/23 1655 09/23/23 2051 09/24/23 0257 09/24/23 0720  GLUCAP 223* 187* 204* 199* 203*    Recent Results (from the past 240 hours)  Resp panel by RT-PCR (RSV, Flu A&B, Covid) Anterior Nasal Swab     Status: None   Collection Time: 09/16/23 10:18 AM   Specimen: Anterior Nasal Swab  Result Value Ref Range Status   SARS Coronavirus 2 by RT PCR NEGATIVE NEGATIVE Final    Comment: (NOTE) SARS-CoV-2 target nucleic acids are NOT DETECTED.  The  SARS-CoV-2 RNA is generally detectable in upper respiratory specimens during the acute phase of infection. The lowest concentration of SARS-CoV-2 viral copies this assay can detect is 138 copies/mL. A negative result does not preclude SARS-Cov-2 infection and should not be used as the sole basis for treatment or other patient management decisions. A negative result may occur with  improper specimen collection/handling, submission of specimen other than nasopharyngeal swab, presence of viral mutation(s) within the areas targeted by this assay, and inadequate number of viral copies(<138 copies/mL). A negative result must be combined with clinical observations, patient history, and epidemiological information. The expected result is Negative.  Fact Sheet for Patients:  bloggercourse.com  Fact Sheet for Healthcare Providers:   seriousbroker.it  This test is no t yet approved or cleared by the [place]  FDA and  has been authorized for detection and/or diagnosis of SARS-CoV-2 by FDA under an Emergency Use Authorization (EUA). This EUA will remain  in effect (meaning this test can be used) for the duration of the COVID-19 declaration under Section 564(b)(1) of the Act, 21 U.S.C.section 360bbb-3(b)(1), unless the authorization is terminated  or revoked sooner.       Influenza A by PCR NEGATIVE NEGATIVE Final   Influenza B by PCR NEGATIVE NEGATIVE Final    Comment: (NOTE) The Xpert Xpress SARS-CoV-2/FLU/RSV plus assay is intended as an aid in the diagnosis of influenza from Nasopharyngeal swab specimens and should not be used as a sole basis for treatment. Nasal washings and aspirates are unacceptable for Xpert Xpress SARS-CoV-2/FLU/RSV testing.  Fact Sheet for Patients: bloggercourse.com  Fact Sheet for Healthcare Providers: seriousbroker.it  This test is not yet approved or cleared by the [place]  FDA and has been authorized for detection and/or diagnosis of SARS-CoV-2 by FDA under an Emergency Use Authorization (EUA). This EUA will remain in effect (meaning this test can be used) for the duration of the COVID-19 declaration under Section 564(b)(1) of the Act, 21 U.S.C. section 360bbb-3(b)(1), unless the authorization is terminated or revoked.     Resp Syncytial Virus by PCR NEGATIVE NEGATIVE Final    Comment: (NOTE) Fact Sheet for Patients: bloggercourse.com  Fact Sheet for Healthcare Providers: seriousbroker.it  This test is not yet approved or cleared by the [place]  FDA and has been authorized for detection and/or diagnosis of SARS-CoV-2 by FDA under an Emergency Use Authorization (EUA). This EUA will remain in effect (meaning this test can be used) for  the duration of the COVID-19 declaration under Section 564(b)(1) of the Act, 21 U.S.C. section 360bbb-3(b)(1), unless the authorization is terminated or revoked.  Performed at [facility], [address] [place], [place] [address]   Blood culture (routine x 2)     Status: None   Collection Time: 09/16/23 10:20 AM   Specimen: BLOOD  Result Value Ref Range Status   Specimen Description BLOOD BLOOD RIGHT ARM FOREARM  Final   Special Requests   Final    BOTTLES DRAWN AEROBIC AND ANAEROBIC Blood Culture adequate volume   Culture   Final    NO GROWTH 5 DAYS Performed at [facility], [address], [place], [place] [address]    Report Status 09/21/2023 FINAL  Final  Blood culture (routine x 2)     Status: None   Collection Time: 09/16/23 10:20 AM   Specimen: BLOOD  Result Value Ref Range Status   Specimen Description BLOOD BLOOD RIGHT ARM RAC  Final   Special Requests   Final    BOTTLES DRAWN  AEROBIC AND ANAEROBIC Blood Culture adequate volume   Culture   Final    NO GROWTH 5 DAYS Performed at [facility], [address] [place], [place] [address]    Report Status 09/21/2023 FINAL  Final  Respiratory (~20 pathogens) panel by PCR     Status: None   Collection Time: 09/23/23 12:14 AM   Specimen: Nasopharyngeal Swab; Respiratory  Result Value Ref Range Status   Adenovirus NOT DETECTED NOT DETECTED Final   Coronavirus 229E NOT DETECTED NOT DETECTED Final    Comment: (NOTE) The Coronavirus on the Respiratory Panel, DOES NOT test for the novel  Coronavirus (2019 nCoV)    Coronavirus HKU1 NOT DETECTED NOT DETECTED Final   Coronavirus NL63 NOT DETECTED NOT DETECTED Final   Coronavirus OC43 NOT DETECTED NOT DETECTED Final   Metapneumovirus NOT DETECTED NOT DETECTED Final   Rhinovirus / Enterovirus NOT DETECTED NOT DETECTED Final   Influenza A NOT DETECTED NOT DETECTED Final   Influenza B NOT DETECTED NOT DETECTED Final   Parainfluenza Virus 1 NOT DETECTED NOT DETECTED Final    Parainfluenza Virus 2 NOT DETECTED NOT DETECTED Final   Parainfluenza Virus 3 NOT DETECTED NOT DETECTED Final   Parainfluenza Virus 4 NOT DETECTED NOT DETECTED Final   Respiratory Syncytial Virus NOT DETECTED NOT DETECTED Final   Bordetella pertussis NOT DETECTED NOT DETECTED Final   Bordetella Parapertussis NOT DETECTED NOT DETECTED Final   Chlamydophila pneumoniae NOT DETECTED NOT DETECTED Final   Mycoplasma pneumoniae NOT DETECTED NOT DETECTED Final    Comment: Performed at [facility], [address], [place], [place] [address]  SARS Coronavirus 2 by RT PCR (hospital order, performed in [facility] hospital lab) *cepheid single result test* Anterior Nasal Swab     Status: None   Collection Time: 09/23/23 12:16 AM   Specimen: Anterior Nasal Swab  Result Value Ref Range Status   SARS Coronavirus 2 by RT PCR NEGATIVE NEGATIVE Final    Comment: (NOTE) SARS-CoV-2 target nucleic acids are NOT DETECTED.  The SARS-CoV-2 RNA is generally detectable in upper and lower respiratory specimens during the acute phase of infection. The lowest concentration of SARS-CoV-2 viral copies this assay can detect is 250 copies / mL. A negative result does not preclude SARS-CoV-2 infection and should not be used as the sole basis for treatment or other patient management decisions.  A negative result may occur with improper specimen collection / handling, submission of specimen other than nasopharyngeal swab, presence of viral mutation(s) within the areas targeted by this assay, and inadequate number of viral copies (<250 copies / mL). A negative result must be combined with clinical observations, patient history, and epidemiological information.  Fact Sheet for Patients:   roadlaptop.co.za  Fact Sheet for Healthcare Providers: http://kim-miller.com/  This test is not yet approved or  cleared by the [place]  FDA and has been authorized for  detection and/or diagnosis of SARS-CoV-2 by FDA under an Emergency Use Authorization (EUA).  This EUA will remain in effect (meaning this test can be used) for the duration of the COVID-19 declaration under Section 564(b)(1) of the Act, 21 U.S.C. section 360bbb-3(b)(1), unless the authorization is terminated or revoked sooner.  Performed at [facility], [address], [place], [place] [address]   Culture, blood (Routine X 2) w Reflex to ID Panel     Status: None (Preliminary result)   Collection Time: 09/23/23  1:09 AM   Specimen: BLOOD  Result Value Ref Range Status  Specimen Description BLOOD BLOOD RIGHT ARM  Final   Special Requests   Final    BOTTLES DRAWN AEROBIC AND ANAEROBIC Blood Culture adequate volume   Culture   Final    NO GROWTH 1 DAY Performed at [facility], [address], [place], [place] [address]    Report Status PENDING  Incomplete  Culture, blood (Routine X 2) w Reflex to ID Panel     Status: None (Preliminary result)   Collection Time: 09/23/23  1:12 AM   Specimen: BLOOD  Result Value Ref Range Status   Specimen Description BLOOD BLOOD RIGHT ARM  Final   Special Requests   Final    BOTTLES DRAWN AEROBIC AND ANAEROBIC Blood Culture adequate volume   Culture   Final    NO GROWTH 1 DAY Performed at [facility], [address] [place], [place] [address]    Report Status PENDING  Incomplete   Radiology Studies: DG Chest 1 View Result Date: 09/23/2023 CLINICAL DATA:  Fevers EXAM: PORTABLE CHEST 1 VIEW COMPARISON:  09/16/2023 FINDINGS: The heart size and mediastinal contours are within normal limits. Both lungs are clear. The visualized skeletal structures are unremarkable. IMPRESSION: No active disease. Electronically Signed   By: Oneil Devonshire M.D.   On: 09/23/2023 00:37   Scheduled Meds:  atorvastatin   80 mg Oral q1800   cyanocobalamin   1,000 mcg Oral Daily   docusate sodium   100 mg Oral BID   enoxaparin  (LOVENOX ) injection  40 mg Subcutaneous Q24H    fluticasone  furoate-vilanterol  1 puff Inhalation Daily   furosemide   40 mg Oral Daily   glipiZIDE   2.5 mg Oral QAC breakfast   insulin  aspart  0-15 Units Subcutaneous TID WC   ipratropium-albuterol   3 mL Nebulization TID   metoprolol  tartrate  50 mg Oral BID   mirtazapine   15 mg Oral QHS   multivitamin with minerals  1 tablet Oral Daily   naltrexone   50 mg Oral Daily   omega-3 acid ethyl esters  1,000 mg Oral Daily   pantoprazole   40 mg Oral Daily   potassium chloride  SA  20 mEq Oral BID   risperiDONE   1 mg Oral QHS   tamsulosin   0.4 mg Oral Daily   thiamine   250 mg Oral Daily   Continuous Infusions:  cefTRIAXone  (ROCEPHIN )  IV      LOS: 8 days   Rendall Carwin, MD How to contact the [facility] Attending or Consulting provider 7A - 7P or covering provider during after hours 7P -7A, for this patient?   09/24/2023, 10:11 AM

## 2023-09-24 NOTE — Progress Notes (Signed)
   09/24/23 0826  Assess: MEWS Score  Temp 98.8 F (37.1 C)  BP (!) 142/73  MAP (mmHg) 93  Pulse Rate (!) 103  Resp (!) 26  SpO2 94 %  O2 Device Nasal Cannula  O2 Flow Rate (L/min) 3 L/min  Assess: MEWS Score  MEWS Temp 0  MEWS Systolic 0  MEWS Pulse 1  MEWS RR 2  MEWS LOC 0  MEWS Score 3  MEWS Score Color Yellow  Assess: if the MEWS score is Yellow or Red  Were vital signs accurate and taken at a resting state? Yes  Does the patient meet 2 or more of the SIRS criteria? Yes  Does the patient have a confirmed or suspected source of infection? No  MEWS guidelines implemented  No, previously yellow, continue vital signs every 4 hours  Assess: SIRS CRITERIA  SIRS Temperature  0  SIRS Respirations  1  SIRS Pulse 1  SIRS WBC 0  SIRS Score Sum  2

## 2023-09-24 NOTE — Progress Notes (Signed)
   09/24/23 1226  Assess: MEWS Score  Temp 97.8 F (36.6 C)  BP 124/74  MAP (mmHg) 88  Pulse Rate 84  Resp 20  SpO2 96 %  O2 Device Nasal Cannula  O2 Flow Rate (L/min) 2 L/min  Assess: MEWS Score  MEWS Temp 0  MEWS Systolic 0  MEWS Pulse 0  MEWS RR 0  MEWS LOC 0  MEWS Score 0  MEWS Score Color Green  Assess: SIRS CRITERIA  SIRS Temperature  0  SIRS Respirations  0  SIRS Pulse 0  SIRS WBC 0  SIRS Score Sum  0

## 2023-09-24 NOTE — Plan of Care (Signed)
 Pt continues to be yellow mews due to respirations. Sating 92-93 % on 3L/[place]. UA sent from condom cath pulled straight from line from new specimen. Temp 100.8 tylenol  given. Pt continues to have congested airway attempts to clear with cough.  Problem: Education: Goal: Knowledge of General Education information will improve Description: Including pain rating scale, medication(s)/side effects and non-pharmacologic comfort measures Outcome: Progressing   Problem: Clinical Measurements: Goal: Will remain free from infection Outcome: Progressing Goal: Diagnostic test results will improve Outcome: Progressing Goal: Cardiovascular complication will be avoided Outcome: Progressing   Problem: Activity: Goal: Risk for activity intolerance will decrease Outcome: Progressing   Problem: Nutrition: Goal: Adequate nutrition will be maintained Outcome: Progressing

## 2023-09-25 ENCOUNTER — Inpatient Hospital Stay (HOSPITAL_COMMUNITY): Payer: HMO

## 2023-09-25 LAB — GLUCOSE, CAPILLARY
Glucose-Capillary: 209 mg/dL — ABNORMAL HIGH (ref 70–99)
Glucose-Capillary: 218 mg/dL — ABNORMAL HIGH (ref 70–99)
Glucose-Capillary: 206 mg/dL — ABNORMAL HIGH (ref 70–99)
Glucose-Capillary: 185 mg/dL — ABNORMAL HIGH (ref 70–99)
Glucose-Capillary: 297 mg/dL — ABNORMAL HIGH (ref 70–99)

## 2023-09-25 MED ORDER — BISACODYL 10 MG RE SUPP
10.0000 mg | Freq: Once | RECTAL | Status: AC
  Administered 2023-09-25: 10 mg via RECTAL
  Filled 2023-09-25: qty 1

## 2023-09-25 MED ORDER — SENNOSIDES-DOCUSATE SODIUM 8.6-50 MG PO TABS
2.0000 | ORAL_TABLET | Freq: Every day | ORAL | Status: DC
  Administered 2023-09-25 – 2023-09-29 (×5): 2 via ORAL
  Filled 2023-09-25 (×5): qty 2

## 2023-09-25 NOTE — Progress Notes (Signed)
   09/25/23 1343  Vitals  Temp 98.4 F (36.9 C)  Temp Source Axillary  BP 117/74  MAP (mmHg) 87  BP Location Right Arm  BP Method Automatic  Patient Position (if appropriate) Lying  Pulse Rate 87  Pulse Rate Source Dinamap  Resp (!) 30  Level of Consciousness  Level of Consciousness Alert  MEWS COLOR  MEWS Score Color Yellow  Oxygen Therapy  SpO2 96 %  O2 Device Nasal Cannula  O2 Flow Rate (L/min) 3 L/min  MEWS Score  MEWS Temp 0  MEWS Systolic 0  MEWS Pulse 0  MEWS RR 2  MEWS LOC 0  MEWS Score 2  Provider Notification  Provider Name/Title [email or web address]  Date Provider Notified 09/25/23  Time Provider Notified 1345  Method of Notification Page  Notification Reason Other (Comment) (yellow mews)  Provider response No new orders  Date of Provider Response 09/25/23  Time of Provider Response 1345   MD Emokpae notified.

## 2023-09-25 NOTE — Progress Notes (Signed)
   09/24/23 2106  Vitals  Temp (!) 100.7 F (38.2 C)  Temp Source Axillary  BP (!) 145/79  MAP (mmHg) 94  BP Location Right Arm  BP Method Automatic  Patient Position (if appropriate) Lying  Pulse Rate (!) 113  Pulse Rate Source Dinamap  Resp (!) 21  MEWS COLOR  MEWS Score Color Red  Oxygen Therapy  SpO2 94 %  O2 Device Nasal Cannula  O2 Flow Rate (L/min) 2 L/min  Pain Assessment  Pain Scale PAINAD  PAINAD (Pain Assessment in Advanced Dementia)  Breathing 1  Negative Vocalization 0  Facial Expression 0  Body Language 0  Consolability 0  PAINAD Score 1  MEWS Score  MEWS Temp 1  MEWS Systolic 0  MEWS Pulse 2  MEWS RR 1  MEWS LOC 0  MEWS Score 4  Provider Notification  Provider Name/Title Adefeso  Date Provider Notified 09/24/23  Time Provider Notified 2115  Method of Notification  (Secure chat)  Notification Reason Other (Comment) (RED MEWS)  Provider response No new orders  Date of Provider Response 09/24/23  Time of Provider Response 2156

## 2023-09-25 NOTE — Progress Notes (Addendum)
 PROGRESS NOTE   Jeff Miles  FMW:[id number] DOB: 1951-02-05 DOA: 09/16/2023 PCP: [name], MD   Chief Complaint  Patient presents with   Altered Mental Status   Level of care: Med-Surg  Brief Admission History:  73 year old male with advanced dementia associated with history of alcohol  abuse and Korsakoff's encephalopathy, hypertension, COPD, former longtime smoker, iron  deficiency anemia, type 2 diabetes mellitus, GAD, GERD, BPH with urinary retention, failure to thrive, frequent falls has had several recent hospitalizations most recently discharged on 09/03/2023 after being hospitalized for urinary retention, acute on chronic respiratory failure and metabolic encephalopathy and he was discharged home with home health services with his wife as primary caretaker.  She reports that for the past several days he has had increasing confusion and wandering at night and falling multiple times at home.  He had canceled his outpatient appointment to have a colonoscopy on 09/28/2023.  This was part of a workup for guaiac positive stools and recent colonoscopy was incomplete due to poor prep.  Wife reports that it has been unsafe for him to remain at home because she is unable to care for him due to his frequent falls and nighttime wandering behavior.  She reports that he fell out of a chair this morning and struck his head on the floor.  He was discharged with a Foley catheter but has since been removed and he has been voiding per wife.  She reports that she is unable to care for him at home in his current state.  His ED trauma workup has been negative for acute findings.  He is being admitted for frequent falls and encephalopathy.   Disposition:- Peer to peer review with [employer] 09/20/23: declined for SNF, recommend LTC or memory care -Awaiting insurance authorization/payer source for long-term memory unit--- Medicaid application in progress -Remains medically stable for discharge to  SNF facility when SNF bed can be found and insurance authorization/payer source is available  Assessment and Plan:  1)Acute Metabolic Encephalopathy - RESOLVED  - Patient appears to have underlying advanced dementia -No acute metabolic abnormalities found -Received thiamine  -c/n Fall precautions     2)Frequent Falls -Trauma workup in the ED negative for acute findings -Fall precautions recommended -PT/OT evaluation requested -- SNF recommended ---insurance declines SNF recommends long-term care memory unit   3)Generalized weakness and adult failure to thrive/social/ethics -Family tried caring for him at home but it has become too difficult for the wife to manage alone -Wife did have home health support but this has not been enough -Patient will need placement in a memory care facility Vs SNF -Wife reports home situation unsafe for patient at this time -Palliative care consult appreciated, patient is a DNR/DNI  4)Urinary retention -Foley catheter has been removed -Appears to be voiding well -c/n  home tamsulosin  daily   5)Hypotension - RESOLVED  - Continue metoprolol  37.5 mg twice daily-- Prior to admission patient was on 50 mg twice daily -Improved some with IV fluids which have completed  -Encourage adequate oral intake   6)COPD with chronic bronchospasm -c/n  home bronchodilators -Scheduled bronchodilators ordered via nebulizer   7)Rectal bleeding with guaiac positive stool -Likely secondary to hemorrhoids seen on colonoscopy -Has been unable to have a colonoscopy due to poor prep and family recently canceled the outpatient procedure that was scheduled for 09/28/2023.   8)DM2-.  A1c 5.5 reflecting excellent diabetic control PTA -Holding PTA metformin -c/n glipizide  at 2.5 mg with breakfast Use Novolog /Humalog Sliding scale insulin  with Accu-Cheks/Fingersticks  as ordered   9)Hypokalemia/hypomagnesemia - Replaced  10)GNR UTI ----.SABRA Tmax 100.7 T-current 100.2 Urine Cx  from 09/23/23 with GNR ---final ID and sensitivity pending-- --Chest x-ray without acute findings -COVID-negative and No leukocytosis -IV Rocephin  pending blood and urine culture data from 09/23/2023 -At discharge may change to oral antibiotics  11) acute hypoxic respiratory failure--- chest x-ray from 09/22/2022 without acute findings --Was dced home with home oxygen after recent hospitalization in December 2024 --Currently requiring 2 L of oxygen via nasal cannula -Continue bronchodilators   DVT prophylaxis: enoxaparin  Code Status: DNR Family Communication: Wife is primary contact  disposition: LTC/memory care  Peer to peer review with [employer] 09/20/23: declined for SNF, recommend LTC or memory care  Consultants:  PT/OT   Subjective:  -T max 100.7 -T-current 100.2------- preliminary urine culture from 09/23/2023 with gram-negative rod -No significant cough  No vomiting Discussed with wife on 09/25/23 at bedside, questions answered  -Remains medically stable for discharge to SNF facility when SNF bed can be found and insurance authorization/payer source is available  Objective: Vitals:   09/25/23 0320 09/25/23 0723 09/25/23 0729 09/25/23 0828  BP: 120/68   116/65  Pulse: 83   (!) 103  Resp: 20     Temp: (!) 100.6 F (38.1 C)   100.2 F (37.9 C)  TempSrc: Axillary     SpO2: 97% 95% 95%   Weight: 88.2 kg     Height:        Intake/Output Summary (Last 24 hours) at 09/25/2023 1010 Last data filed at 09/25/2023 0447 Gross per 24 hour  Intake 578.9 ml  Output 300 ml  Net 278.9 ml    Filed Weights   09/23/23 0526 09/24/23 0417 09/25/23 0320  Weight: 89.7 kg 88.5 kg 88.2 kg   Physical Exam Gen:- Awake Alert, in no acute distress  HEENT:- [place].AT, No sclera icterus Nose- [place] 2L/min Ears---HOH Neck-Supple Neck,No JVD,.  Lungs-  CTAB , fair air movement bilaterally  CV- S1, S2 normal, RRR Abd-  +ve B.Sounds, Abd Soft, No tenderness, no CVA area tenderness     Extremity/Skin:-Scaly dry skin on legs, chronic venous stasis , good pedal pulses  Psych--pleasantly confused, obvious cognitive and memory deficits Neuro-generalized weakness, no new focal deficits, no tremors  Data Reviewed: I have personally reviewed following labs and imaging studies  CBC: Recent Labs  Lab 09/22/23 0342 09/23/23 0112  WBC 7.7 7.3  HGB 9.5* 10.4*  HCT 32.7* 34.1*  MCV 88.4 87.0  PLT 287 286    Basic Metabolic Panel: Recent Labs  Lab 09/19/23 0504 09/22/23 0342 09/23/23 0112  NA 137 135  --   K 3.6 4.5  --   CL 102 101  --   CO2 26 25  --   GLUCOSE 152* 171*  --   BUN 10 17  --   CREATININE 1.05 1.06 1.41*  CALCIUM  9.0 9.5  --   MG 1.6* 1.8  --   PHOS  --  3.4  --    CBG: Recent Labs  Lab 09/24/23 1148 09/24/23 1623 09/24/23 2141 09/25/23 0325 09/25/23 0719  GLUCAP 190* 166* 255* 209* 218*    Recent Results (from the past 240 hours)  Resp panel by RT-PCR (RSV, Flu A&B, Covid) Anterior Nasal Swab     Status: None   Collection Time: 09/16/23 10:18 AM   Specimen: Anterior Nasal Swab  Result Value Ref Range Status   SARS Coronavirus 2 by RT PCR NEGATIVE NEGATIVE Final  Comment: (NOTE) SARS-CoV-2 target nucleic acids are NOT DETECTED.  The SARS-CoV-2 RNA is generally detectable in upper respiratory specimens during the acute phase of infection. The lowest concentration of SARS-CoV-2 viral copies this assay can detect is 138 copies/mL. A negative result does not preclude SARS-Cov-2 infection and should not be used as the sole basis for treatment or other patient management decisions. A negative result may occur with  improper specimen collection/handling, submission of specimen other than nasopharyngeal swab, presence of viral mutation(s) within the areas targeted by this assay, and inadequate number of viral copies(<138 copies/mL). A negative result must be combined with clinical observations, patient history, and  epidemiological information. The expected result is Negative.  Fact Sheet for Patients:  bloggercourse.com  Fact Sheet for Healthcare Providers:  seriousbroker.it  This test is no t yet approved or cleared by the [place]  FDA and  has been authorized for detection and/or diagnosis of SARS-CoV-2 by FDA under an Emergency Use Authorization (EUA). This EUA will remain  in effect (meaning this test can be used) for the duration of the COVID-19 declaration under Section 564(b)(1) of the Act, 21 U.S.C.section 360bbb-3(b)(1), unless the authorization is terminated  or revoked sooner.       Influenza A by PCR NEGATIVE NEGATIVE Final   Influenza B by PCR NEGATIVE NEGATIVE Final    Comment: (NOTE) The Xpert Xpress SARS-CoV-2/FLU/RSV plus assay is intended as an aid in the diagnosis of influenza from Nasopharyngeal swab specimens and should not be used as a sole basis for treatment. Nasal washings and aspirates are unacceptable for Xpert Xpress SARS-CoV-2/FLU/RSV testing.  Fact Sheet for Patients: bloggercourse.com  Fact Sheet for Healthcare Providers: seriousbroker.it  This test is not yet approved or cleared by the [place]  FDA and has been authorized for detection and/or diagnosis of SARS-CoV-2 by FDA under an Emergency Use Authorization (EUA). This EUA will remain in effect (meaning this test can be used) for the duration of the COVID-19 declaration under Section 564(b)(1) of the Act, 21 U.S.C. section 360bbb-3(b)(1), unless the authorization is terminated or revoked.     Resp Syncytial Virus by PCR NEGATIVE NEGATIVE Final    Comment: (NOTE) Fact Sheet for Patients: bloggercourse.com  Fact Sheet for Healthcare Providers: seriousbroker.it  This test is not yet approved or cleared by the [place]  FDA and has been  authorized for detection and/or diagnosis of SARS-CoV-2 by FDA under an Emergency Use Authorization (EUA). This EUA will remain in effect (meaning this test can be used) for the duration of the COVID-19 declaration under Section 564(b)(1) of the Act, 21 U.S.C. section 360bbb-3(b)(1), unless the authorization is terminated or revoked.  Performed at [facility], [address] [place], [place] [address]   Blood culture (routine x 2)     Status: None   Collection Time: 09/16/23 10:20 AM   Specimen: BLOOD  Result Value Ref Range Status   Specimen Description BLOOD BLOOD RIGHT ARM FOREARM  Final   Special Requests   Final    BOTTLES DRAWN AEROBIC AND ANAEROBIC Blood Culture adequate volume   Culture   Final    NO GROWTH 5 DAYS Performed at [facility], [address], [place], [place] [address]    Report Status 09/21/2023 FINAL  Final  Blood culture (routine x 2)     Status: None   Collection Time: 09/16/23 10:20 AM   Specimen: BLOOD  Result Value Ref Range Status   Specimen Description BLOOD BLOOD RIGHT ARM RAC  Final  Special Requests   Final    BOTTLES DRAWN AEROBIC AND ANAEROBIC Blood Culture adequate volume   Culture   Final    NO GROWTH 5 DAYS Performed at [facility], [address], [place], [place] [address]    Report Status 09/21/2023 FINAL  Final  Respiratory (~20 pathogens) panel by PCR     Status: None   Collection Time: 09/23/23 12:14 AM   Specimen: Nasopharyngeal Swab; Respiratory  Result Value Ref Range Status   Adenovirus NOT DETECTED NOT DETECTED Final   Coronavirus 229E NOT DETECTED NOT DETECTED Final    Comment: (NOTE) The Coronavirus on the Respiratory Panel, DOES NOT test for the novel  Coronavirus (2019 nCoV)    Coronavirus HKU1 NOT DETECTED NOT DETECTED Final   Coronavirus NL63 NOT DETECTED NOT DETECTED Final   Coronavirus OC43 NOT DETECTED NOT DETECTED Final   Metapneumovirus NOT DETECTED NOT DETECTED Final   Rhinovirus / Enterovirus NOT DETECTED  NOT DETECTED Final   Influenza A NOT DETECTED NOT DETECTED Final   Influenza B NOT DETECTED NOT DETECTED Final   Parainfluenza Virus 1 NOT DETECTED NOT DETECTED Final   Parainfluenza Virus 2 NOT DETECTED NOT DETECTED Final   Parainfluenza Virus 3 NOT DETECTED NOT DETECTED Final   Parainfluenza Virus 4 NOT DETECTED NOT DETECTED Final   Respiratory Syncytial Virus NOT DETECTED NOT DETECTED Final   Bordetella pertussis NOT DETECTED NOT DETECTED Final   Bordetella Parapertussis NOT DETECTED NOT DETECTED Final   Chlamydophila pneumoniae NOT DETECTED NOT DETECTED Final   Mycoplasma pneumoniae NOT DETECTED NOT DETECTED Final    Comment: Performed at [facility], [address], [place], [place] [address]  SARS Coronavirus 2 by RT PCR (hospital order, performed in [facility] hospital lab) *cepheid single result test* Anterior Nasal Swab     Status: None   Collection Time: 09/23/23 12:16 AM   Specimen: Anterior Nasal Swab  Result Value Ref Range Status   SARS Coronavirus 2 by RT PCR NEGATIVE NEGATIVE Final    Comment: (NOTE) SARS-CoV-2 target nucleic acids are NOT DETECTED.  The SARS-CoV-2 RNA is generally detectable in upper and lower respiratory specimens during the acute phase of infection. The lowest concentration of SARS-CoV-2 viral copies this assay can detect is 250 copies / mL. A negative result does not preclude SARS-CoV-2 infection and should not be used as the sole basis for treatment or other patient management decisions.  A negative result may occur with improper specimen collection / handling, submission of specimen other than nasopharyngeal swab, presence of viral mutation(s) within the areas targeted by this assay, and inadequate number of viral copies (<250 copies / mL). A negative result must be combined with clinical observations, patient history, and epidemiological information.  Fact Sheet for Patients:   roadlaptop.co.za  Fact Sheet  for Healthcare Providers: http://kim-miller.com/  This test is not yet approved or  cleared by the [place]  FDA and has been authorized for detection and/or diagnosis of SARS-CoV-2 by FDA under an Emergency Use Authorization (EUA).  This EUA will remain in effect (meaning this test can be used) for the duration of the COVID-19 declaration under Section 564(b)(1) of the Act, 21 U.S.C. section 360bbb-3(b)(1), unless the authorization is terminated or revoked sooner.  Performed at [facility], [address] [place], [place] [address]   Culture, blood (Routine X 2) w Reflex to ID Panel     Status: None (Preliminary result)   Collection Time: 09/23/23  1:09 AM  Specimen: BLOOD  Result Value Ref Range Status   Specimen Description BLOOD BLOOD RIGHT ARM  Final   Special Requests   Final    BOTTLES DRAWN AEROBIC AND ANAEROBIC Blood Culture adequate volume   Culture   Final    NO GROWTH 2 DAYS Performed at [facility], [address], [place], [place] [address]    Report Status PENDING  Incomplete  Culture, blood (Routine X 2) w Reflex to ID Panel     Status: None (Preliminary result)   Collection Time: 09/23/23  1:12 AM   Specimen: BLOOD  Result Value Ref Range Status   Specimen Description BLOOD BLOOD RIGHT ARM  Final   Special Requests   Final    BOTTLES DRAWN AEROBIC AND ANAEROBIC Blood Culture adequate volume   Culture   Final    NO GROWTH 2 DAYS Performed at [facility], [address] [place], [place] [address]    Report Status PENDING  Incomplete   Radiology Studies: No results found.  Scheduled Meds:  atorvastatin   80 mg Oral q1800   cyanocobalamin   1,000 mcg Oral Daily   docusate sodium   100 mg Oral BID   enoxaparin  (LOVENOX ) injection  40 mg Subcutaneous Q24H   fluticasone  furoate-vilanterol  1 puff Inhalation Daily   furosemide   40 mg Oral Daily   glipiZIDE   2.5 mg Oral QAC breakfast   insulin  aspart  0-15 Units Subcutaneous TID WC    ipratropium-albuterol   3 mL Nebulization TID   metoprolol  tartrate  50 mg Oral BID   mirtazapine   15 mg Oral QHS   multivitamin with minerals  1 tablet Oral Daily   naltrexone   50 mg Oral Daily   omega-3 acid ethyl esters  1,000 mg Oral Daily   pantoprazole   40 mg Oral Daily   potassium chloride  SA  20 mEq Oral BID   risperiDONE   1 mg Oral QHS   tamsulosin   0.4 mg Oral Daily   thiamine   250 mg Oral Daily   Continuous Infusions:  cefTRIAXone  (ROCEPHIN )  IV 1 g (09/25/23 0942)    LOS: 9 days   Rendall Carwin, MD How to contact the [facility] Attending or Consulting provider 7A - 7P or covering provider during after hours 7P -7A, for this patient?   09/25/2023, 10:10 AM

## 2023-09-25 NOTE — Plan of Care (Signed)
  Problem: Clinical Measurements: Goal: Respiratory complications will improve Outcome: Progressing   Problem: Nutrition: Goal: Adequate nutrition will be maintained Outcome: Progressing   Problem: Safety: Goal: Ability to remain free from injury will improve Outcome: Progressing   Problem: Skin Integrity: Goal: Risk for impaired skin integrity will decrease Outcome: Progressing   Problem: Clinical Measurements: Goal: Will remain free from infection Outcome: Not Progressing

## 2023-09-26 ENCOUNTER — Other Ambulatory Visit (HOSPITAL_COMMUNITY): Payer: Medicare HMO

## 2023-09-26 DIAGNOSIS — Z515 Encounter for palliative care: Secondary | ICD-10-CM

## 2023-09-26 DIAGNOSIS — Z7189 Other specified counseling: Secondary | ICD-10-CM

## 2023-09-26 LAB — GLUCOSE, CAPILLARY
Glucose-Capillary: 178 mg/dL — ABNORMAL HIGH (ref 70–99)
Glucose-Capillary: 189 mg/dL — ABNORMAL HIGH (ref 70–99)
Glucose-Capillary: 224 mg/dL — ABNORMAL HIGH (ref 70–99)
Glucose-Capillary: 211 mg/dL — ABNORMAL HIGH (ref 70–99)
Glucose-Capillary: 230 mg/dL — ABNORMAL HIGH (ref 70–99)

## 2023-09-26 MED ORDER — SODIUM CHLORIDE 0.9 % IV SOLN
2.0000 g | Freq: Two times a day (BID) | INTRAVENOUS | Status: DC
  Administered 2023-09-26 – 2023-09-27 (×2): 2 g via INTRAVENOUS
  Filled 2023-09-26 (×2): qty 12.5

## 2023-09-26 NOTE — Progress Notes (Signed)
 PROGRESS NOTE   Jeff Miles  FMW:[id number] DOB: [birth year] DOA: 09/16/2023 PCP: [name], MD   Chief Complaint  Patient presents with   Altered Mental Status   Level of care: Med-Surg  Brief Admission History:  73 year old male with advanced dementia associated with history of alcohol  abuse and Korsakoff's encephalopathy, hypertension, COPD, former longtime smoker, iron  deficiency anemia, type 2 diabetes mellitus, GAD, GERD, BPH with urinary retention, failure to thrive, frequent falls has had several recent hospitalizations most recently discharged on 09/03/2023 after being hospitalized for urinary retention, acute on chronic respiratory failure and metabolic encephalopathy and he was discharged home with home health services with his wife as primary caretaker.  She reports that for the past several days he has had increasing confusion and wandering at night and falling multiple times at home.  He had canceled his outpatient appointment to have a colonoscopy on 09/28/2023.  This was part of a workup for guaiac positive stools and recent colonoscopy was incomplete due to poor prep.  Wife reports that it has been unsafe for him to remain at home because she is unable to care for him due to his frequent falls and nighttime wandering behavior.  She reports that he fell out of a chair this morning and struck his head on the floor.  He was discharged with a Foley catheter but has since been removed and he has been voiding per wife.  She reports that she is unable to care for him at home in his current state.  His ED trauma workup has been negative for acute findings.  He is being admitted for frequent falls and encephalopathy.   Disposition:- Peer to peer review with [employer] 09/20/23: declined for SNF, recommend LTC or memory care -Awaiting insurance authorization/payer source for long-term memory unit--- Medicaid application in progress -Remains medically stable for discharge to  SNF facility when SNF bed can be found and insurance authorization/payer source is available  Assessment and Plan:  1)Acute Metabolic Encephalopathy - RESOLVED  - Patient appears to have underlying advanced dementia -No acute metabolic abnormalities found -Received thiamine  -c/n Fall precautions     2)Frequent Falls -Trauma workup in the ED negative for acute findings -Fall precautions recommended -PT/OT evaluation requested -- SNF recommended ---insurance declines SNF recommends long-term care memory unit   3)Generalized weakness and adult failure to thrive/social/ethics -Family tried caring for him at home but it has become too difficult for the wife to manage alone -Wife did have home health support but this has not been enough -Patient will need placement in a memory care facility Vs SNF -Wife reports home situation unsafe for patient at this time -Palliative care consult appreciated, patient is a DNR/DNI  4)Urinary retention -Foley catheter has been removed -Appears to be voiding well -c/n  home tamsulosin  daily   5)Hypotension - RESOLVED  - Continue metoprolol  37.5 mg twice daily-- Prior to admission patient was on 50 mg twice daily -Improved some with IV fluids which have completed  -Encourage adequate oral intake   6)COPD with chronic bronchospasm -c/n  home bronchodilators -Scheduled bronchodilators ordered via nebulizer   7)Rectal bleeding with guaiac positive stool -Likely secondary to hemorrhoids seen on colonoscopy -Has been unable to have a colonoscopy due to poor prep and family recently canceled the outpatient procedure that was scheduled for 09/28/2023.   8)DM2-.  A1c 5.5 reflecting excellent diabetic control PTA -Holding PTA metformin -c/n glipizide  at 2.5 mg with breakfast Use Novolog /Humalog Sliding scale insulin  with Accu-Cheks/Fingersticks  as ordered   9)Hypokalemia/hypomagnesemia - Replaced  10)Pseudomonas UTI ----.SABRA afebrile  --Chest x-ray  without acute findings -COVID-negative and No leukocytosis 09/26/23 -Urine culture with Pseudomonas urine Cx from 09/23/23 with Pseudomonas aeruginosa ---final  sensitivity pending-- -treated with Rocephin  -Start cefepime  pending sensitivity data  11) acute hypoxic respiratory failure--- chest x-ray from 09/22/2022 without acute findings --Was dced home with home oxygen after recent hospitalization in December 2024 --Currently requiring 2 L of oxygen via nasal cannula -Continue bronchodilators   DVT prophylaxis: enoxaparin  Code Status: DNR Family Communication: Wife is primary contact  disposition: LTC/memory care  Peer to peer review with [employer] 09/20/23: declined for SNF, recommend LTC or memory care  Consultants:  PT/OT   Subjective:  -Wife at bedside, questions answered -Urine growing Pseudomonas will start IV cefepime --pending sensitivity  -Remains medically stable for discharge to SNF facility when SNF bed can be found and insurance authorization/payer source is available  Objective: Vitals:   09/26/23 0730 09/26/23 0734 09/26/23 1236 09/26/23 1302  BP:   (!) 153/74   Pulse:      Resp:   17   Temp:   97.9 F (36.6 C)   TempSrc:      SpO2: 91% 91% (!) 88% (!) 77%  Weight:      Height:        Intake/Output Summary (Last 24 hours) at 09/26/2023 1811 Last data filed at 09/26/2023 1300 Gross per 24 hour  Intake 480 ml  Output 350 ml  Net 130 ml    Filed Weights   09/24/23 0417 09/25/23 0320 09/26/23 0319  Weight: 88.5 kg 88.2 kg 88.2 kg   Physical Exam Gen:- Awake Alert, in no acute distress  HEENT:- [place].AT, No sclera icterus Nose- [place] 2L/min Ears---HOH Neck-Supple Neck,No JVD,.  Lungs-  CTAB , fair air movement bilaterally  CV- S1, S2 normal, RRR Abd-  +ve B.Sounds, Abd Soft, No tenderness, no CVA area tenderness    Extremity/Skin:-Scaly dry skin on legs, chronic venous stasis , good pedal pulses  Psych--pleasantly confused, obvious cognitive  and memory deficits Neuro-generalized weakness, no new focal deficits, no tremors  Data Reviewed: I have personally reviewed following labs and imaging studies  CBC: Recent Labs  Lab 09/22/23 0342 09/23/23 0112  WBC 7.7 7.3  HGB 9.5* 10.4*  HCT 32.7* 34.1*  MCV 88.4 87.0  PLT 287 286    Basic Metabolic Panel: Recent Labs  Lab 09/22/23 0342 09/23/23 0112  NA 135  --   K 4.5  --   CL 101  --   CO2 25  --   GLUCOSE 171*  --   BUN 17  --   CREATININE 1.06 1.41*  CALCIUM  9.5  --   MG 1.8  --   PHOS 3.4  --    CBG: Recent Labs  Lab 09/25/23 2130 09/26/23 0316 09/26/23 0721 09/26/23 1153 09/26/23 1611  GLUCAP 297* 178* 189* 224* 211*    Recent Results (from the past 240 hours)  Respiratory (~20 pathogens) panel by PCR     Status: None   Collection Time: 09/23/23 12:14 AM   Specimen: Nasopharyngeal Swab; Respiratory  Result Value Ref Range Status   Adenovirus NOT DETECTED NOT DETECTED Final   Coronavirus 229E NOT DETECTED NOT DETECTED Final    Comment: (NOTE) The Coronavirus on the Respiratory Panel, DOES NOT test for the novel  Coronavirus (2019 nCoV)    Coronavirus HKU1 NOT DETECTED NOT DETECTED Final   Coronavirus NL63 NOT DETECTED NOT DETECTED  Final   Coronavirus OC43 NOT DETECTED NOT DETECTED Final   Metapneumovirus NOT DETECTED NOT DETECTED Final   Rhinovirus / Enterovirus NOT DETECTED NOT DETECTED Final   Influenza A NOT DETECTED NOT DETECTED Final   Influenza B NOT DETECTED NOT DETECTED Final   Parainfluenza Virus 1 NOT DETECTED NOT DETECTED Final   Parainfluenza Virus 2 NOT DETECTED NOT DETECTED Final   Parainfluenza Virus 3 NOT DETECTED NOT DETECTED Final   Parainfluenza Virus 4 NOT DETECTED NOT DETECTED Final   Respiratory Syncytial Virus NOT DETECTED NOT DETECTED Final   Bordetella pertussis NOT DETECTED NOT DETECTED Final   Bordetella Parapertussis NOT DETECTED NOT DETECTED Final   Chlamydophila pneumoniae NOT DETECTED NOT DETECTED Final    Mycoplasma pneumoniae NOT DETECTED NOT DETECTED Final    Comment: Performed at [facility], [address], [place], [place] [address]  SARS Coronavirus 2 by RT PCR (hospital order, performed in [facility] hospital lab) *cepheid single result test* Anterior Nasal Swab     Status: None   Collection Time: 09/23/23 12:16 AM   Specimen: Anterior Nasal Swab  Result Value Ref Range Status   SARS Coronavirus 2 by RT PCR NEGATIVE NEGATIVE Final    Comment: (NOTE) SARS-CoV-2 target nucleic acids are NOT DETECTED.  The SARS-CoV-2 RNA is generally detectable in upper and lower respiratory specimens during the acute phase of infection. The lowest concentration of SARS-CoV-2 viral copies this assay can detect is 250 copies / mL. A negative result does not preclude SARS-CoV-2 infection and should not be used as the sole basis for treatment or other patient management decisions.  A negative result may occur with improper specimen collection / handling, submission of specimen other than nasopharyngeal swab, presence of viral mutation(s) within the areas targeted by this assay, and inadequate number of viral copies (<250 copies / mL). A negative result must be combined with clinical observations, patient history, and epidemiological information.  Fact Sheet for Patients:   roadlaptop.co.za  Fact Sheet for Healthcare Providers: http://kim-miller.com/  This test is not yet approved or  cleared by the [place]  FDA and has been authorized for detection and/or diagnosis of SARS-CoV-2 by FDA under an Emergency Use Authorization (EUA).  This EUA will remain in effect (meaning this test can be used) for the duration of the COVID-19 declaration under Section 564(b)(1) of the Act, 21 U.S.C. section 360bbb-3(b)(1), unless the authorization is terminated or revoked sooner.  Performed at [facility], [address], [place], [place] [address]    Culture, blood (Routine X 2) w Reflex to ID Panel     Status: None (Preliminary result)   Collection Time: 09/23/23  1:09 AM   Specimen: BLOOD  Result Value Ref Range Status   Specimen Description BLOOD BLOOD RIGHT ARM  Final   Special Requests   Final    BOTTLES DRAWN AEROBIC AND ANAEROBIC Blood Culture adequate volume   Culture   Final    NO GROWTH 3 DAYS Performed at [facility] [address] [place], [place] [address]    Report Status PENDING  Incomplete  Culture, blood (Routine X 2) w Reflex to ID Panel     Status: None (Preliminary result)   Collection Time: 09/23/23  1:12 AM   Specimen: BLOOD  Result Value Ref Range Status   Specimen Description BLOOD BLOOD RIGHT ARM  Final   Special Requests   Final    BOTTLES DRAWN AEROBIC AND ANAEROBIC Blood Culture adequate volume   Culture  Final    NO GROWTH 3 DAYS Performed at [facility], [address] [place], [place] [address]    Report Status PENDING  Incomplete  Urine Culture     Status: Abnormal (Preliminary result)   Collection Time: 09/23/23  2:43 PM   Specimen: Urine, Random  Result Value Ref Range Status   Specimen Description   Final    URINE, RANDOM Performed at [facility], [address], [place], [place] [address]    Special Requests   Final    NONE Reflexed from [phone] Performed at [facility], [address], [place], [place] [address]    Culture (A)  Final    >=100,000 COLONIES/mL PSEUDOMONAS AERUGINOSA CULTURE REINCUBATED FOR BETTER GROWTH Performed at [facility], [address], [place], [place] [address]    Report Status PENDING  Incomplete   Radiology Studies: DG CHEST PORT 1 VIEW Result Date: 09/25/2023 CLINICAL DATA:  [id number] Dyspnea and respiratory abnormalities [id number] EXAM: PORTABLE CHEST 1 VIEW COMPARISON:  Chest x-ray 09/23/2023, CT chest 09/16/2023 FINDINGS: The heart and mediastinal contours are unchanged. Aortic calcification. No focal consolidation. Chronic coarsened  interstitial markings with no overt pulmonary edema. No pleural effusion. No pneumothorax. No acute osseous abnormality. IMPRESSION: 1. No active disease. 2. Aortic Atherosclerosis (ICD10-I70.0) and Emphysema (ICD10-J43.9). Electronically Signed   By: Morgane  Naveau M.D.   On: 09/25/2023 18:10    Scheduled Meds:  atorvastatin   80 mg Oral q1800   cyanocobalamin   1,000 mcg Oral Daily   enoxaparin  (LOVENOX ) injection  40 mg Subcutaneous Q24H   fluticasone  furoate-vilanterol  1 puff Inhalation Daily   furosemide   40 mg Oral Daily   glipiZIDE   2.5 mg Oral QAC breakfast   insulin  aspart  0-15 Units Subcutaneous TID WC   ipratropium-albuterol   3 mL Nebulization TID   metoprolol  tartrate  50 mg Oral BID   mirtazapine   15 mg Oral QHS   multivitamin with minerals  1 tablet Oral Daily   naltrexone   50 mg Oral Daily   omega-3 acid ethyl esters  1,000 mg Oral Daily   pantoprazole   40 mg Oral Daily   potassium chloride  SA  20 mEq Oral BID   risperiDONE   1 mg Oral QHS   senna-docusate  2 tablet Oral QHS   tamsulosin   0.4 mg Oral Daily   thiamine   250 mg Oral Daily   Continuous Infusions:    LOS: 10 days   Rendall Carwin, MD How to contact the [facility] Attending or Consulting provider 7A - 7P or covering provider during after hours 7P -7A, for this patient?   09/26/2023, 6:11 PM

## 2023-09-26 NOTE — Progress Notes (Signed)
 Pharmacy Antibiotic Note  Jeff Miles is a 73 y.o. male admitted on 09/16/2023 with UTI. Patient presenting with increased confusion and multiple falls at home. PMH significant for HTN, COPD, T2DM, GAD, GERD, BPH with urinary retention. Patient remains afebrile, however, urine culture now growing 100K colonies of pseudomonas aeroginosa (susceptibility pending). Pharmacy has been consulted to transition antibiotics to cefepime .  Plan: Start cefepime  2 g IV every 12 hours based on current renal function Monitor renal function, clinical status, culture data, and LOT  Height: 5' 9 (175.3 cm) Weight: 88.2 kg (194 lb 7.1 oz) IBW/kg (Calculated) : 70.7  Temp (24hrs), Avg:98.3 F (36.8 C), Min:97.9 F (36.6 C), Max:98.9 F (37.2 C)  Recent Labs  Lab 09/22/23 0342 09/23/23 0112  WBC 7.7 7.3  CREATININE 1.06 1.41*  LATICACIDVEN  --  1.1    Estimated Creatinine Clearance: 52 mL/min (A) (by C-G formula based on SCr of 1.41 mg/dL (H)).    No Known Allergies  Antimicrobials this admission: Ceftriaxone  1/11 >> 1/13 Cefepime  1/13 >>   Dose adjustments this admission: N/A  Microbiology results: 1/10 BCx: pending 1/10 UCx: 100K pseudomonas (susceptibility pending)   Thank you for involving pharmacy in this patient's care.   Damien Napoleon, PharmD Clinical Pharmacist 09/26/2023 6:18 PM

## 2023-09-26 NOTE — Progress Notes (Signed)
 Palliative: Chart review completed.  Face-to-face discussion with transition of care team.  At this point transition of care team is working diligently for placement.  No additional needs identified at this time.  DNR/goldenrod form completed and placed on chart.  PMT to shadow.  No charge Lorenza Birkenhead, NP Palliative medicine team Team phone [phone]

## 2023-09-26 NOTE — Inpatient Diabetes Management (Signed)
 Inpatient Diabetes Program Recommendations  AACE/ADA: New Consensus Statement on Inpatient Glycemic Control (2015)  Target Ranges:  Prepandial:   less than 140 mg/dL      Peak postprandial:   less than 180 mg/dL (1-2 hours)      Critically ill patients:  140 - 180 mg/dL   Lab Results  Component Value Date   GLUCAP 224 (H) 09/26/2023   HGBA1C 5.5 08/30/2023    Review of Glycemic Control  Latest Reference Range & Units 09/25/23 07:19 09/25/23 11:39 09/25/23 16:44 09/25/23 21:30 09/26/23 03:16 09/26/23 07:21 09/26/23 11:53  Glucose-Capillary 70 - 99 mg/dL 781 (H) 793 (H) 814 (H) 297 (H) 178 (H) 189 (H) 224 (H)   Diabetes history: DM 2 Outpatient Diabetes medications: Glipizide  5 mg bid, Metformin 1000 mg bid Current orders for Inpatient glycemic control:  Glipizide  2.5 mg Daily Novolog  0-15 units tid  A1c 5.5% on 12/17 Note: Glucose trends increase after PO intake.  Inpatient Diabetes Program Recommendations:    -   Consider adding Novolog  4 units tid meal coverage if eating >50% of meals -   Add Novolog  hs scale  Thanks,  Clotilda Bull RN, MSN, BC-ADM Inpatient Diabetes Coordinator Team Pager [phone] (8a-5p)

## 2023-09-26 NOTE — Progress Notes (Signed)
 Patient constantly removing oxygen tubing from nose. Found him on RA with SPO2 of 77% and no distress. Patients SPO2 increased with nebulizer treatment and placed back on [place] at 3lpm. Patient reminded not to touch [place]. He has been instructed to cough often to clear secretions.

## 2023-09-26 NOTE — TOC Progression Note (Signed)
 Transition of Care [facility]) - Progression Note    Patient Details  Name: Jeff Miles MRN: [id number] Date of Birth: 1951-02-19  Transition of Care [facility]) CM/SW Contact  Rollo Petri, LCSW Phone Number: 09/26/2023, 12:35 PM  Clinical Narrative:     TOC following. Spoke with Marval at [facility] and re-sent referral. She will talk to her corporate people and see if they can offer with an LOG. Message also left for admissions at [facility] requesting return call to inquire on status of referral there. Will follow.  Expected Discharge Plan: Skilled Nursing Facility Barriers to Discharge: No SNF bed  Expected Discharge Plan and Services In-house Referral: Clinical Social Work   Post Acute Care Choice: Skilled Nursing Facility Living arrangements for the past 2 months: Single Family Home                                       Social Determinants of Health (SDOH) Interventions SDOH Screenings   Food Insecurity: No Food Insecurity (09/16/2023)  Housing: Low Risk  (09/16/2023)  Transportation Needs: No Transportation Needs (09/16/2023)  Utilities: Not At Risk (09/16/2023)  Social Connections: Patient Unable To Answer (09/16/2023)  Tobacco Use: Medium Risk (09/16/2023)    Readmission Risk Interventions    09/17/2023    4:45 PM  Readmission Risk Prevention Plan  Transportation Screening Complete  PCP or Specialist Appt within 3-5 Days Complete  HRI or Home Care Consult Complete  Social Work Consult for Recovery Care Planning/Counseling Complete  Palliative Care Screening Not Applicable  Medication Review Oceanographer) Complete

## 2023-09-27 LAB — BASIC METABOLIC PANEL
Anion gap: 11 (ref 5–15)
BUN: 58 mg/dL — ABNORMAL HIGH (ref 8–23)
CO2: 22 mmol/L (ref 22–32)
Calcium: 9.6 mg/dL (ref 8.9–10.3)
Chloride: 104 mmol/L (ref 98–111)
Creatinine, Ser: 1.13 mg/dL (ref 0.61–1.24)
GFR, Estimated: >60 mL/min (ref >60)
Glucose, Bld: 218 mg/dL — ABNORMAL HIGH (ref 70–99)
Potassium: 5 mmol/L (ref 3.5–5.1)
Sodium: 137 mmol/L (ref 135–145)

## 2023-09-27 LAB — CBC
HCT: 29.7 % — ABNORMAL LOW (ref 39.0–52.0)
Hemoglobin: 8.5 g/dL — ABNORMAL LOW (ref 13.0–17.0)
MCH: 25.4 pg — ABNORMAL LOW (ref 26.0–34.0)
MCHC: 28.6 g/dL — ABNORMAL LOW (ref 30.0–36.0)
MCV: 88.7 fL (ref 80.0–100.0)
Platelets: 252 10*3/uL (ref 150–400)
RBC: 3.35 MIL/uL — ABNORMAL LOW (ref 4.22–5.81)
RDW: 14.4 % (ref 11.5–15.5)
WBC: 7.9 10*3/uL (ref 4.0–10.5)
nRBC: 0 % (ref 0.0–0.2)

## 2023-09-27 LAB — GLUCOSE, CAPILLARY
Glucose-Capillary: 206 mg/dL — ABNORMAL HIGH (ref 70–99)
Glucose-Capillary: 229 mg/dL — ABNORMAL HIGH (ref 70–99)
Glucose-Capillary: 253 mg/dL — ABNORMAL HIGH (ref 70–99)
Glucose-Capillary: 246 mg/dL — ABNORMAL HIGH (ref 70–99)
Glucose-Capillary: 298 mg/dL — ABNORMAL HIGH (ref 70–99)

## 2023-09-27 MED ORDER — GLIPIZIDE 5 MG PO TABS
5.0000 mg | ORAL_TABLET | Freq: Every day | ORAL | Status: DC
  Administered 2023-09-28 – 2023-09-30 (×3): 5 mg via ORAL
  Filled 2023-09-27 (×3): qty 1

## 2023-09-27 MED ORDER — CIPROFLOXACIN HCL 250 MG PO TABS
500.0000 mg | ORAL_TABLET | Freq: Two times a day (BID) | ORAL | Status: DC
Start: 2023-09-27 — End: 2023-09-30
  Administered 2023-09-27 – 2023-09-30 (×7): 500 mg via ORAL
  Filled 2023-09-27 (×8): qty 2

## 2023-09-27 NOTE — Plan of Care (Signed)
  Problem: Health Behavior/Discharge Planning: Goal: Ability to manage health-related needs will improve Outcome: Not Met (add Reason)   Problem: Clinical Measurements: Goal: Ability to maintain clinical measurements within normal limits will improve Outcome: Not Met (add Reason)   Problem: Activity: Goal: Risk for activity intolerance will decrease Outcome: Not Met (add Reason)

## 2023-09-27 NOTE — Inpatient Diabetes Management (Signed)
 Inpatient Diabetes Program Recommendations  AACE/ADA: New Consensus Statement on Inpatient Glycemic Control   Target Ranges:  Prepandial:   less than 140 mg/dL      Peak postprandial:   less than 180 mg/dL (1-2 hours)      Critically ill patients:  140 - 180 mg/dL    Latest Reference Range & Units 09/26/23 07:21 09/26/23 11:53 09/26/23 16:11 09/26/23 20:43 09/27/23 03:35  Glucose-Capillary 70 - 99 mg/dL 810 (H) 775 (H) 788 (H) 230 (H) 206 (H)   Review of Glycemic Control  Diabetes history: DM2 Outpatient Diabetes medications: Glipizide  5 mg BID, Metformin 1000 mg BID Current orders for Inpatient glycemic control: Novolog  0-15 units TID with meals, Glipizide  2.5 mg QAM  Inpatient Diabetes Program Recommendations:    Oral DM medication: May want to consider increasing Glipizide  to 5 mg QAM.  Thanks, Earnie Gainer, RN, MSN, CDCES Diabetes Coordinator Inpatient Diabetes Program [phone] (Team Pager from 8am to 5pm)

## 2023-09-27 NOTE — TOC Progression Note (Signed)
 Transition of Care [name]) - Progression Note    Patient Details  Name: Jeff Miles MRN: [id number] Date of Birth: 01-01-1951  Transition of Care [facility]) CM/SW Contact  [place] Saddie Kim, [place] Phone Number: 09/27/2023, 3:44 PM  Clinical Narrative: LCSW left voicemail for [facility] requesting return call. [facility] unable to consider pt for regular SNF unit due to [facility] pending. [facility] has no male beds at this time. [facility] is considering pt for regular SNF unit. Wife aware business office may call her later today. TOC will continue to seek placement.       Expected Discharge Plan: Skilled Nursing Facility Barriers to Discharge: No SNF bed  Expected Discharge Plan and Services In-house Referral: Clinical Social Work   Post Acute Care Choice: Skilled Nursing Facility Living arrangements for the past 2 months: Single Family Home                                       Social Determinants of Health (SDOH) Interventions SDOH Screenings   Food Insecurity: No Food Insecurity (09/16/2023)  Housing: Low Risk  (09/16/2023)  Transportation Needs: No Transportation Needs (09/16/2023)  Utilities: Not At Risk (09/16/2023)  Social Connections: Patient Unable To Answer (09/16/2023)  Tobacco Use: Medium Risk (09/16/2023)    Readmission Risk Interventions    09/17/2023    4:45 PM  Readmission Risk Prevention Plan  Transportation Screening Complete  PCP or Specialist Appt within 3-5 Days Complete  HRI or Home Care Consult Complete  Social Work Consult for Recovery Care Planning/Counseling Complete  Palliative Care Screening Not Applicable  Medication Review Oceanographer) Complete

## 2023-09-27 NOTE — Progress Notes (Signed)
 PROGRESS NOTE   Jeff Miles  FMW:[id number] DOB: September 18, 1950 DOA: 09/16/2023 PCP: [name], MD   Chief Complaint  Patient presents with   Altered Mental Status   Level of care: Med-Surg  Brief Admission History:  73 year old male with advanced dementia associated with history of alcohol  abuse and Korsakoff's encephalopathy, hypertension, COPD, former longtime smoker, iron  deficiency anemia, type 2 diabetes mellitus, GAD, GERD, BPH with urinary retention, failure to thrive, frequent falls has had several recent hospitalizations most recently discharged on 09/03/2023 after being hospitalized for urinary retention, acute on chronic respiratory failure and metabolic encephalopathy and he was discharged home with home health services with his wife as primary caretaker.  She reports that for the past several days he has had increasing confusion and wandering at night and falling multiple times at home.  He had canceled his outpatient appointment to have a colonoscopy on 09/28/2023.  This was part of a workup for guaiac positive stools and recent colonoscopy was incomplete due to poor prep.  Wife reports that it has been unsafe for him to remain at home because she is unable to care for him due to his frequent falls and nighttime wandering behavior.  She reports that he fell out of a chair this morning and struck his head on the floor.  He was discharged with a Foley catheter but has since been removed and he has been voiding per wife.  She reports that she is unable to care for him at home in his current state.  His ED trauma workup has been negative for acute findings.  He is being admitted for frequent falls and encephalopathy.   Disposition:- Peer to peer review with [employer] 09/20/23: declined for SNF, recommend LTC or memory care -Awaiting insurance authorization/payer source for long-term memory unit--- Medicaid application in progress -Remains medically stable for discharge to  SNF facility Versus Long-term memory unit when SNF bed can be found and insurance authorization/payer source is available  Assessment and Plan:  1)Acute Metabolic Encephalopathy - RESOLVED  - Patient appears to have underlying advanced dementia -No acute metabolic abnormalities found -Received thiamine  -c/n Fall precautions     2)Frequent Falls -Trauma workup in the ED negative for acute findings -Fall precautions recommended -PT/OT evaluation requested -- SNF recommended ---insurance declines SNF recommends long-term care memory unit   3)Generalized weakness and adult failure to thrive/social/ethics -Family tried caring for him at home but it has become too difficult for the wife to manage alone -Wife did have home health support but this has not been enough -Patient will need placement in a memory care facility Vs SNF -Wife reports home situation unsafe for patient at this time -Palliative care consult appreciated, patient is a DNR/DNI  4)Urinary retention -Foley catheter has been removed -Appears to be voiding well -c/n  home tamsulosin  daily   5)Hypotension - RESOLVED  - Continue metoprolol  37.5 mg twice daily-- Prior to admission patient was on 50 mg twice daily -Improved some with IV fluids which have completed  -Encourage adequate oral intake   6)COPD with chronic bronchospasm -c/n  home bronchodilators -Scheduled bronchodilators ordered via nebulizer   7)Rectal bleeding with guaiac positive stool -Likely secondary to hemorrhoids seen on colonoscopy -Has been unable to have a colonoscopy due to poor prep and family recently canceled the outpatient procedure that was scheduled for 09/28/2023.   8)DM2-.  A1c 5.5 reflecting excellent diabetic control PTA -Holding PTA metformin -c/n glipizide  at 2.5 mg with breakfast Use Novolog /Humalog Sliding  scale insulin  with Accu-Cheks/Fingersticks as ordered   9)Hypokalemia/hypomagnesemia - Replaced  10)Pseudomonas UTI ----.SABRA  afebrile  --Chest x-ray without acute findings -COVID-negative and No leukocytosis 09/27/23 -Urine culture with Pseudomonas urine Cx from 09/23/23 with Pseudomonas aeruginosa ---- -Initially treated with Rocephin , and received a couple doses of cefepime  -okay to De-escalate to Cipro  patient seems to report  11) acute hypoxic respiratory failure--- chest x-ray from 09/22/2022 without acute findings --Was dced home with home oxygen after recent hospitalization in December 2024 --Currently requiring 2 L of oxygen via nasal cannula -Continue bronchodilators   DVT prophylaxis: enoxaparin  Code Status: DNR Family Communication: Wife is primary contact  disposition: LTC/memory care  Peer to peer review with [employer] 09/20/23: declined for SNF, recommend LTC or memory care  Consultants:  PT/OT   Subjective:  -Wife at bedside, questions answered -Ate better today for breakfast  -Remains medically stable for discharge to SNF facility Versus Long-term memory unit when SNF bed can be found and insurance authorization/payer source is available  Objective: Vitals:   09/27/23 0337 09/27/23 0748 09/27/23 0818 09/27/23 1447  BP:  122/66  131/62  Pulse:  (!) 110  96  Resp:    (!) 24  Temp:  98.2 F (36.8 C)  100.3 F (37.9 C)  TempSrc:    Oral  SpO2:  90% (!) 83% 96%  Weight: 86.8 kg     Height:        Intake/Output Summary (Last 24 hours) at 09/27/2023 1514 Last data filed at 09/27/2023 1448 Gross per 24 hour  Intake 290 ml  Output 2000 ml  Net -1710 ml    Filed Weights   09/25/23 0320 09/26/23 0319 09/27/23 0337  Weight: 88.2 kg 88.2 kg 86.8 kg   Physical Exam Gen:- Awake Alert, in no acute distress  HEENT:- [place].AT, No sclera icterus Nose- [place] 2L/min Ears---HOH Neck-Supple Neck,No JVD,.  Lungs-  CTAB , fair air movement bilaterally  CV- S1, S2 normal, RRR Abd-  +ve B.Sounds, Abd Soft, No tenderness, no CVA area tenderness    Extremity/Skin:-Scaly dry skin on legs,  chronic venous stasis , good pedal pulses  Psych--pleasantly confused, obvious cognitive and memory deficits Neuro-generalized weakness, no new focal deficits, no tremors  Data Reviewed: I have personally reviewed following labs and imaging studies  CBC: Recent Labs  Lab 09/22/23 0342 09/23/23 0112 09/27/23 0435  WBC 7.7 7.3 7.9  HGB 9.5* 10.4* 8.5*  HCT 32.7* 34.1* 29.7*  MCV 88.4 87.0 88.7  PLT 287 286 252    Basic Metabolic Panel: Recent Labs  Lab 09/22/23 0342 09/23/23 0112 09/27/23 0435  NA 135  --  137  K 4.5  --  5.0  CL 101  --  104  CO2 25  --  22  GLUCOSE 171*  --  218*  BUN 17  --  58*  CREATININE 1.06 1.41* 1.13  CALCIUM  9.5  --  9.6  MG 1.8  --   --   PHOS 3.4  --   --    CBG: Recent Labs  Lab 09/26/23 1611 09/26/23 2043 09/27/23 0335 09/27/23 0756 09/27/23 1148  GLUCAP 211* 230* 206* 229* 253*    Recent Results (from the past 240 hours)  Respiratory (~20 pathogens) panel by PCR     Status: None   Collection Time: 09/23/23 12:14 AM   Specimen: Nasopharyngeal Swab; Respiratory  Result Value Ref Range Status   Adenovirus NOT DETECTED NOT DETECTED Final   Coronavirus 229E NOT DETECTED NOT  DETECTED Final    Comment: (NOTE) The Coronavirus on the Respiratory Panel, DOES NOT test for the novel  Coronavirus (2019 nCoV)    Coronavirus HKU1 NOT DETECTED NOT DETECTED Final   Coronavirus NL63 NOT DETECTED NOT DETECTED Final   Coronavirus OC43 NOT DETECTED NOT DETECTED Final   Metapneumovirus NOT DETECTED NOT DETECTED Final   Rhinovirus / Enterovirus NOT DETECTED NOT DETECTED Final   Influenza A NOT DETECTED NOT DETECTED Final   Influenza B NOT DETECTED NOT DETECTED Final   Parainfluenza Virus 1 NOT DETECTED NOT DETECTED Final   Parainfluenza Virus 2 NOT DETECTED NOT DETECTED Final   Parainfluenza Virus 3 NOT DETECTED NOT DETECTED Final   Parainfluenza Virus 4 NOT DETECTED NOT DETECTED Final   Respiratory Syncytial Virus NOT DETECTED NOT DETECTED  Final   Bordetella pertussis NOT DETECTED NOT DETECTED Final   Bordetella Parapertussis NOT DETECTED NOT DETECTED Final   Chlamydophila pneumoniae NOT DETECTED NOT DETECTED Final   Mycoplasma pneumoniae NOT DETECTED NOT DETECTED Final    Comment: Performed at [facility], [address], [place], [place] [address]  SARS Coronavirus 2 by RT PCR (hospital order, performed in [facility] hospital lab) *cepheid single result test* Anterior Nasal Swab     Status: None   Collection Time: 09/23/23 12:16 AM   Specimen: Anterior Nasal Swab  Result Value Ref Range Status   SARS Coronavirus 2 by RT PCR NEGATIVE NEGATIVE Final    Comment: (NOTE) SARS-CoV-2 target nucleic acids are NOT DETECTED.  The SARS-CoV-2 RNA is generally detectable in upper and lower respiratory specimens during the acute phase of infection. The lowest concentration of SARS-CoV-2 viral copies this assay can detect is 250 copies / mL. A negative result does not preclude SARS-CoV-2 infection and should not be used as the sole basis for treatment or other patient management decisions.  A negative result may occur with improper specimen collection / handling, submission of specimen other than nasopharyngeal swab, presence of viral mutation(s) within the areas targeted by this assay, and inadequate number of viral copies (<250 copies / mL). A negative result must be combined with clinical observations, patient history, and epidemiological information.  Fact Sheet for Patients:   roadlaptop.co.za  Fact Sheet for Healthcare Providers: http://kim-miller.com/  This test is not yet approved or  cleared by the [place]  FDA and has been authorized for detection and/or diagnosis of SARS-CoV-2 by FDA under an Emergency Use Authorization (EUA).  This EUA will remain in effect (meaning this test can be used) for the duration of the COVID-19 declaration under Section 564(b)(1) of  the Act, 21 U.S.C. section 360bbb-3(b)(1), unless the authorization is terminated or revoked sooner.  Performed at [facility], [address], [place], [place] [address]   Culture, blood (Routine X 2) w Reflex to ID Panel     Status: None (Preliminary result)   Collection Time: 09/23/23  1:09 AM   Specimen: BLOOD  Result Value Ref Range Status   Specimen Description BLOOD BLOOD RIGHT ARM  Final   Special Requests   Final    BOTTLES DRAWN AEROBIC AND ANAEROBIC Blood Culture adequate volume   Culture   Final    NO GROWTH 4 DAYS Performed at [facility], [address], [place], [place] [address]    Report Status PENDING  Incomplete  Culture, blood (Routine X 2) w Reflex to ID Panel     Status: None (Preliminary result)   Collection Time: 09/23/23  1:12 AM  Specimen: BLOOD  Result Value Ref Range Status   Specimen Description BLOOD BLOOD RIGHT ARM  Final   Special Requests   Final    BOTTLES DRAWN AEROBIC AND ANAEROBIC Blood Culture adequate volume   Culture   Final    NO GROWTH 4 DAYS Performed at [facility], [address], [place], [place] [address]    Report Status PENDING  Incomplete  Urine Culture     Status: Abnormal (Preliminary result)   Collection Time: 09/23/23  2:43 PM   Specimen: Urine, Random  Result Value Ref Range Status   Specimen Description   Final    URINE, RANDOM Performed at [facility], [address] [place], [place] [address]    Special Requests   Final    NONE Reflexed from [id number] Performed at [facility], [address], [place], [place] [address]    Culture (A)  Final    >=100,000 COLONIES/mL PSEUDOMONAS AERUGINOSA 80,000 COLONIES/mL ESCHERICHIA COLI SUSCEPTIBILITIES TO FOLLOW Performed at [facility], [address] [place], [place] [address]    Report Status PENDING  Incomplete   Organism ID, Bacteria PSEUDOMONAS AERUGINOSA (A)  Final      Susceptibility   Pseudomonas aeruginosa - MIC*    CEFTAZIDIME 8 SENSITIVE Sensitive      CIPROFLOXACIN  <=0.25 SENSITIVE Sensitive     GENTAMICIN <=1 SENSITIVE Sensitive     IMIPENEM 1 SENSITIVE Sensitive     PIP/TAZO 16 SENSITIVE Sensitive ug/mL    CEFEPIME  4 SENSITIVE Sensitive     * >=100,000 COLONIES/mL PSEUDOMONAS AERUGINOSA   Radiology Studies: DG CHEST PORT 1 VIEW Result Date: 09/25/2023 CLINICAL DATA:  [id number] Dyspnea and respiratory abnormalities [id number] EXAM: PORTABLE CHEST 1 VIEW COMPARISON:  Chest x-ray 09/23/2023, CT chest 09/16/2023 FINDINGS: The heart and mediastinal contours are unchanged. Aortic calcification. No focal consolidation. Chronic coarsened interstitial markings with no overt pulmonary edema. No pleural effusion. No pneumothorax. No acute osseous abnormality. IMPRESSION: 1. No active disease. 2. Aortic Atherosclerosis (ICD10-I70.0) and Emphysema (ICD10-J43.9). Electronically Signed   By: Morgane  Naveau M.D.   On: 09/25/2023 18:10    Scheduled Meds:  atorvastatin   80 mg Oral q1800   ciprofloxacin   500 mg Oral BID   cyanocobalamin   1,000 mcg Oral Daily   enoxaparin  (LOVENOX ) injection  40 mg Subcutaneous Q24H   fluticasone  furoate-vilanterol  1 puff Inhalation Daily   furosemide   40 mg Oral Daily   [START ON 09/28/2023] glipiZIDE   5 mg Oral QAC breakfast   insulin  aspart  0-15 Units Subcutaneous TID WC   ipratropium-albuterol   3 mL Nebulization TID   metoprolol  tartrate  50 mg Oral BID   mirtazapine   15 mg Oral QHS   multivitamin with minerals  1 tablet Oral Daily   naltrexone   50 mg Oral Daily   omega-3 acid ethyl esters  1,000 mg Oral Daily   pantoprazole   40 mg Oral Daily   potassium chloride  SA  20 mEq Oral BID   risperiDONE   1 mg Oral QHS   senna-docusate  2 tablet Oral QHS   tamsulosin   0.4 mg Oral Daily   thiamine   250 mg Oral Daily   Continuous Infusions:   LOS: 11 days   Rendall Carwin, MD How to contact the [facility] Attending or Consulting provider 7A - 7P or covering provider during after hours 7P -7A, for this patient?   09/27/2023,  3:14 PM

## 2023-09-28 ENCOUNTER — Ambulatory Visit (HOSPITAL_COMMUNITY): Admit: 2023-09-28 | Payer: Medicare HMO | Admitting: Internal Medicine

## 2023-09-28 ENCOUNTER — Encounter (HOSPITAL_COMMUNITY): Payer: Self-pay

## 2023-09-28 DIAGNOSIS — W19XXXD Unspecified fall, subsequent encounter: Secondary | ICD-10-CM

## 2023-09-28 DIAGNOSIS — R29898 Other symptoms and signs involving the musculoskeletal system: Secondary | ICD-10-CM

## 2023-09-28 DIAGNOSIS — N400 Enlarged prostate without lower urinary tract symptoms: Secondary | ICD-10-CM

## 2023-09-28 DIAGNOSIS — N390 Urinary tract infection, site not specified: Secondary | ICD-10-CM

## 2023-09-28 LAB — GLUCOSE, CAPILLARY
Glucose-Capillary: 246 mg/dL — ABNORMAL HIGH (ref 70–99)
Glucose-Capillary: 267 mg/dL — ABNORMAL HIGH (ref 70–99)
Glucose-Capillary: 248 mg/dL — ABNORMAL HIGH (ref 70–99)
Glucose-Capillary: 225 mg/dL — ABNORMAL HIGH (ref 70–99)
Glucose-Capillary: 258 mg/dL — ABNORMAL HIGH (ref 70–99)

## 2023-09-28 LAB — CULTURE, BLOOD (ROUTINE X 2)
Culture: NO GROWTH
Culture: NO GROWTH
Special Requests: ADEQUATE
Special Requests: ADEQUATE

## 2023-09-28 LAB — URINE CULTURE: Culture: >=100000 — AB

## 2023-09-28 SURGERY — COLONOSCOPY WITH PROPOFOL
Anesthesia: Monitor Anesthesia Care

## 2023-09-28 NOTE — Progress Notes (Signed)
 Palliative:  Chart review completed and face-to-face discussion with transition of care team related to disposition.   At this point, Jeff Miles continues to await acceptance for memory care unit with Medicaid pending.    Transition of care team is working closely to assist patient.  No charge   Arla Lab, NP Palliative medicine team Team phone [phone]

## 2023-09-28 NOTE — Progress Notes (Signed)
 Physical Therapy Note  Patient Details  Name: Jeff Miles MRN: [id number] Date of Birth: November 28, 1950 Today's Date: 09/28/2023    Therapy held today due to inability to follow commands   Vickii Grand B 09/28/2023, 10:44 AM

## 2023-09-28 NOTE — TOC Progression Note (Signed)
 Transition of Care [facility]) - Progression Note    Patient Details  Name: Jeff Miles MRN: [id number] Date of Birth: 02-15-1951  [facility] CM/SW Contact  Ander Katos, [place] Phone Number: 09/28/2023, 3:41 PM  Clinical Narrative: TOC continues to work with [facility] for possible placement. Will follow up in AM.       Expected Discharge Plan: Skilled Nursing Facility Barriers to Discharge: No SNF bed  Expected Discharge Plan and Services In-house Referral: Clinical Social Work   Post Acute Care Choice: Skilled Nursing Facility Living arrangements for the past 2 months: Single Family Home                                       Social Determinants of Health (SDOH) Interventions SDOH Screenings   Food Insecurity: No Food Insecurity (09/16/2023)  Housing: Low Risk  (09/16/2023)  Transportation Needs: No Transportation Needs (09/16/2023)  Utilities: Not At Risk (09/16/2023)  Social Connections: Patient Unable To Answer (09/16/2023)  Tobacco Use: Medium Risk (09/16/2023)    Readmission Risk Interventions    09/17/2023    4:45 PM  Readmission Risk Prevention Plan  Transportation Screening Complete  PCP or Specialist Appt within 3-5 Days Complete  HRI or Home Care Consult Complete  Social Work Consult for Recovery Care Planning/Counseling Complete  Palliative Care Screening Not Applicable  Medication Review Oceanographer) Complete

## 2023-09-28 NOTE — Progress Notes (Signed)
 PROGRESS NOTE   Jeff Miles  UJW:[id number] DOB: 1951-02-04 DOA: 09/16/2023 PCP: Omie Bickers, MD   Chief Complaint  Patient presents with   Altered Mental Status   Level of care: Med-Surg  Brief Admission History:  No notes on file   Disposition:- Peer to peer review with [employer]alt[employer] 09/20/23: declined for SNF, recommend LTC or memory care -Awaiting insurance authorization/payer source for long-term memory unit--- Medicaid application in progress -Remains medically stable for discharge to SNF facility Versus Long-term memory unit when SNF bed can be found and insurance authorization/payer source is available  Assessment and Plan:  1)Acute Metabolic Encephalopathy - RESOLVED  - Patient appears to have underlying advanced dementia -No acute metabolic abnormalities found -Received thiamine  -c/n Fall precautions     2)Frequent Falls -Trauma workup in the ED negative for acute findings -Fall precautions recommended -PT/OT evaluation requested -- SNF recommended ---insurance declines SNF recommends long-term care memory unit   3)Generalized weakness and adult failure to thrive/social/ethics -Family tried caring for him at home but it has become too difficult for the wife to manage alone -Wife did have home health support but this has not been enough -Patient will need placement in a memory care facility Vs SNF -Wife reports home situation unsafe for patient at this time -Palliative care consult appreciated, patient is a DNR/DNI  4)Urinary retention -Foley catheter has been removed -Appears to be voiding well -c/n  home tamsulosin  daily   5)Hypotension - RESOLVED  - Continue metoprolol  37.5 mg twice daily-- Prior to admission patient was on 50 mg twice daily -Improved some with IV fluids which have completed  -Encourage adequate oral intake   6)COPD with chronic bronchospasm -c/n  home bronchodilators -Scheduled bronchodilators ordered via nebulizer    7)Rectal bleeding with guaiac positive stool -Likely secondary to hemorrhoids seen on colonoscopy -Has been unable to have a colonoscopy due to poor prep and family recently canceled the outpatient procedure that was scheduled for 09/28/2023.   8)DM2-.  A1c 5.5 reflecting excellent diabetic control PTA -Holding PTA metformin -c/n glipizide  at 2.5 mg with breakfast Use Novolog /Humalog Sliding scale insulin  with Accu-Cheks/Fingersticks as ordered   9)Hypokalemia/hypomagnesemia - Replaced  10)Pseudomonas/E. coli UTI ----.Aaron Aas afebrile  -COVID-negative and No leukocytosis 09/27/23 -Urine culture with Pseudomonas and E. coli microorganism isolated on urine Cx from 09/23/23 with Pseudomonas aeruginosa ---- -Initially treated with Rocephin , and received a couple doses of cefepime  -okay to De-escalate to Cipro  to complete antibiotic therapy.  11) chronic hypoxic respiratory failure--- chest x-ray from 09/22/2022 without acute findings --Was dced home with home oxygen after recent hospitalization in December 2024 --Currently requiring 2 L of oxygen via nasal cannula -Continue bronchodilators and supportive care.   DVT prophylaxis: enoxaparin  Code Status: DNR Family Communication: Wife is primary contact  disposition: LTC/memory care  Peer to peer review with healt[employer] advantage 09/20/23: declined for SNF, recommend LTC or memory care  Consultants:  PT/OT   Subjective:  -Wife at bedside, all questions answered.  No overnight events.  Hemodynamically stable.  -Remains medically stable for discharge to SNF facility.  Per discussion with [facility] plan is for discharge to [facility] rehabilitation facility tomorrow morning (09/29/2023).  Objective: Vitals:   09/28/23 0951 09/28/23 1301 09/28/23 1429 09/28/23 1450  BP: (!) 142/71 128/70    Pulse: (!) 117 (!) 103    Resp: 20 19    Temp:  (!) 97.4 F (36.3 C)    TempSrc:  Axillary    SpO2: 93% 91% (!) 85%  92%  Weight:      Height:         Intake/Output Summary (Last 24 hours) at 09/28/2023 1742 Last data filed at 09/28/2023 1500 Gross per 24 hour  Intake 540 ml  Output 1750 ml  Net -1210 ml    Filed Weights   09/26/23 0319 09/27/23 0337 09/28/23 0315  Weight: 88.2 kg 86.8 kg 88 kg   Physical Exam General exam: Alert, awake, and intermittently following commands.  No acute distress. Respiratory system: Scattered rhonchi and mild expiratory wheezing appreciated on exam.  2 L nasal cannula supplementation in place with good saturation.  Normal respiratory effort appreciated. Cardiovascular system:RRR.  No rubs or gallops. Gastrointestinal system: Abdomen is nondistended, soft and nontender.  Positive bowel sounds appreciated. Central nervous system: Generally weak; moving 4 limbs spontaneously.  No new focal neurological deficits. Extremities: No cyanosis appreciated; positive chronic stasis dermatitis changes. Skin: No petechiae. Psychiatry: Judgement and insight appear impaired secondary to underlying dementia.  Flat affect.  Data Reviewed: I have personally reviewed following labs and imaging studies  CBC: Recent Labs  Lab 09/22/23 0342 09/23/23 0112 09/27/23 0435  WBC 7.7 7.3 7.9  HGB 9.5* 10.4* 8.5*  HCT 32.7* 34.1* 29.7*  MCV 88.4 87.0 88.7  PLT 287 286 252    Basic Metabolic Panel: Recent Labs  Lab 09/22/23 0342 09/23/23 0112 09/27/23 0435  NA 135  --  137  K 4.5  --  5.0  CL 101  --  104  CO2 25  --  22  GLUCOSE 171*  --  218*  BUN 17  --  58*  CREATININE 1.06 1.41* 1.13  CALCIUM  9.5  --  9.6  MG 1.8  --   --   PHOS 3.4  --   --    CBG: Recent Labs  Lab 09/27/23 2118 09/28/23 0315 09/28/23 0740 09/28/23 1126 09/28/23 1619  GLUCAP 298* 246* 267* 248* 225*    Recent Results (from the past 240 hours)  Respiratory (~20 pathogens) panel by PCR     Status: None   Collection Time: 09/23/23 12:14 AM   Specimen: Nasopharyngeal Swab; Respiratory  Result Value Ref Range Status    Adenovirus NOT DETECTED NOT DETECTED Final   Coronavirus 229E NOT DETECTED NOT DETECTED Final    Comment: (NOTE) The Coronavirus on the Respiratory Panel, DOES NOT test for the novel  Coronavirus (2019 nCoV)    Coronavirus HKU1 NOT DETECTED NOT DETECTED Final   Coronavirus NL63 NOT DETECTED NOT DETECTED Final   Coronavirus OC43 NOT DETECTED NOT DETECTED Final   Metapneumovirus NOT DETECTED NOT DETECTED Final   Rhinovirus / Enterovirus NOT DETECTED NOT DETECTED Final   Influenza A NOT DETECTED NOT DETECTED Final   Influenza B NOT DETECTED NOT DETECTED Final   Parainfluenza Virus 1 NOT DETECTED NOT DETECTED Final   Parainfluenza Virus 2 NOT DETECTED NOT DETECTED Final   Parainfluenza Virus 3 NOT DETECTED NOT DETECTED Final   Parainfluenza Virus 4 NOT DETECTED NOT DETECTED Final   Respiratory Syncytial Virus NOT DETECTED NOT DETECTED Final   Bordetella pertussis NOT DETECTED NOT DETECTED Final   Bordetella Parapertussis NOT DETECTED NOT DETECTED Final   Chlamydophila pneumoniae NOT DETECTED NOT DETECTED Final   Mycoplasma pneumoniae NOT DETECTED NOT DETECTED Final    Comment: Performed at [facility], [address], [place], [place] [address]  SARS Coronavirus 2 by RT PCR (hospital order, performed in [facility] hospital lab) *cepheid single result test* Anterior  Nasal Swab     Status: None   Collection Time: 09/23/23 12:16 AM   Specimen: Anterior Nasal Swab  Result Value Ref Range Status   SARS Coronavirus 2 by RT PCR NEGATIVE NEGATIVE Final    Comment: (NOTE) SARS-CoV-2 target nucleic acids are NOT DETECTED.  The SARS-CoV-2 RNA is generally detectable in upper and lower respiratory specimens during the acute phase of infection. The lowest concentration of SARS-CoV-2 viral copies this assay can detect is 250 copies / mL. A negative result does not preclude SARS-CoV-2 infection and should not be used as the sole basis for treatment or other patient management decisions.  A  negative result may occur with improper specimen collection / handling, submission of specimen other than nasopharyngeal swab, presence of viral mutation(s) within the areas targeted by this assay, and inadequate number of viral copies (<250 copies / mL). A negative result must be combined with clinical observations, patient history, and epidemiological information.  Fact Sheet for Patients:   RoadLapTop.co.za  Fact Sheet for Healthcare Providers: http://kim-miller.com/  This test is not yet approved or  cleared by the [place]  FDA and has been authorized for detection and/or diagnosis of SARS-CoV-2 by FDA under an Emergency Use Authorization (EUA).  This EUA will remain in effect (meaning this test can be used) for the duration of the COVID-19 declaration under Section 564(b)(1) of the Act, 21 U.S.C. section 360bbb-3(b)(1), unless the authorization is terminated or revoked sooner.  Performed at [facility], [address], [place], [place] [address]   Culture, blood (Routine X 2) w Reflex to ID Panel     Status: None   Collection Time: 09/23/23  1:09 AM   Specimen: BLOOD  Result Value Ref Range Status   Specimen Description BLOOD BLOOD RIGHT ARM  Final   Special Requests   Final    BOTTLES DRAWN AEROBIC AND ANAEROBIC Blood Culture adequate volume   Culture   Final    NO GROWTH 5 DAYS Performed at [facility], [address], [place], [place] [address]    Report Status 09/28/2023 FINAL  Final  Culture, blood (Routine X 2) w Reflex to ID Panel     Status: None   Collection Time: 09/23/23  1:12 AM   Specimen: BLOOD  Result Value Ref Range Status   Specimen Description BLOOD BLOOD RIGHT ARM  Final   Special Requests   Final    BOTTLES DRAWN AEROBIC AND ANAEROBIC Blood Culture adequate volume   Culture   Final    NO GROWTH 5 DAYS Performed at [facility], [address] [place], [place] [address]    Report Status 09/28/2023  FINAL  Final  Urine Culture     Status: Abnormal   Collection Time: 09/23/23  2:43 PM   Specimen: Urine, Random  Result Value Ref Range Status   Specimen Description   Final    URINE, RANDOM Performed at [facility], [address], [place], [place] [address]    Special Requests   Final    NONE Reflexed from [id number] Performed at [facility], [address] [place], [place] [address]    Culture (A)  Final    >=100,000 COLONIES/mL PSEUDOMONAS AERUGINOSA 80,000 COLONIES/mL ESCHERICHIA COLI    Report Status 09/28/2023 FINAL  Final   Organism ID, Bacteria PSEUDOMONAS AERUGINOSA (A)  Final   Organism ID, Bacteria ESCHERICHIA COLI (A)  Final      Susceptibility   Escherichia coli - MIC*    AMPICILLIN <=2 SENSITIVE  Sensitive     CEFAZOLIN  <=4 SENSITIVE Sensitive     CEFEPIME  <=0.12 SENSITIVE Sensitive     CEFTRIAXONE  <=0.25 SENSITIVE Sensitive     CIPROFLOXACIN  <=0.25 SENSITIVE Sensitive     GENTAMICIN <=1 SENSITIVE Sensitive     IMIPENEM <=0.25 SENSITIVE Sensitive     NITROFURANTOIN <=16 SENSITIVE Sensitive     TRIMETH/SULFA <=20 SENSITIVE Sensitive     AMPICILLIN/SULBACTAM <=2 SENSITIVE Sensitive     PIP/TAZO <=4 SENSITIVE Sensitive ug/mL    * 80,000 COLONIES/mL ESCHERICHIA COLI   Pseudomonas aeruginosa - MIC*    CEFTAZIDIME 8 SENSITIVE Sensitive     CIPROFLOXACIN  <=0.25 SENSITIVE Sensitive     GENTAMICIN <=1 SENSITIVE Sensitive     IMIPENEM 1 SENSITIVE Sensitive     PIP/TAZO 16 SENSITIVE Sensitive ug/mL    CEFEPIME  4 SENSITIVE Sensitive     * >=100,000 COLONIES/mL PSEUDOMONAS AERUGINOSA   Radiology Studies: No results found.   Scheduled Meds:  atorvastatin   80 mg Oral q1800   ciprofloxacin   500 mg Oral BID   cyanocobalamin   1,000 mcg Oral Daily   enoxaparin  (LOVENOX ) injection  40 mg Subcutaneous Q24H   fluticasone  furoate-vilanterol  1 puff Inhalation Daily   furosemide   40 mg Oral Daily   glipiZIDE   5 mg Oral QAC breakfast   insulin  aspart  0-15 Units Subcutaneous  TID WC   ipratropium-albuterol   3 mL Nebulization TID   metoprolol  tartrate  50 mg Oral BID   mirtazapine   15 mg Oral QHS   multivitamin with minerals  1 tablet Oral Daily   naltrexone   50 mg Oral Daily   omega-3 acid ethyl esters  1,000 mg Oral Daily   pantoprazole   40 mg Oral Daily   potassium chloride  SA  20 mEq Oral BID   risperiDONE   1 mg Oral QHS   senna-docusate  2 tablet Oral QHS   tamsulosin   0.4 mg Oral Daily   thiamine   250 mg Oral Daily   Continuous Infusions:   LOS: 12 days   Justina Oman, MD.  How to contact the [facility] Attending or Consulting provider 7A - 7P or covering provider during after hours 7P -7A, for this patient?   09/28/2023, 5:42 PM

## 2023-09-28 NOTE — Progress Notes (Signed)
 Pt assisted up by two staff members to chair. Pt not able to bear full weight, was pivot transfer. Pt encouraged to cough and deep breathe, did fair. Pt c/o pain to left ear, has stage 2 ulcer to top left ear from O2 tubing. Area cleaned, gauze cushion applied. Pt denies any other c/o. Chair alarm on for safety, call bell within reach.

## 2023-09-29 DIAGNOSIS — D509 Iron deficiency anemia, unspecified: Secondary | ICD-10-CM

## 2023-09-29 LAB — GLUCOSE, CAPILLARY
Glucose-Capillary: 221 mg/dL — ABNORMAL HIGH (ref 70–99)
Glucose-Capillary: 246 mg/dL — ABNORMAL HIGH (ref 70–99)
Glucose-Capillary: 233 mg/dL — ABNORMAL HIGH (ref 70–99)
Glucose-Capillary: 236 mg/dL — ABNORMAL HIGH (ref 70–99)
Glucose-Capillary: 171 mg/dL — ABNORMAL HIGH (ref 70–99)

## 2023-09-29 MED ORDER — ACETAMINOPHEN 500 MG PO TABS: 500.0000 mg | ORAL_TABLET | Freq: Four times a day (QID) | ORAL | Status: DC | PRN

## 2023-09-29 MED ORDER — CIPROFLOXACIN HCL 500 MG PO TABS: 500.0000 mg | ORAL_TABLET | Freq: Two times a day (BID) | ORAL | 0 refills | Status: AC

## 2023-09-29 MED ORDER — LORAZEPAM 0.5 MG PO TABS: 0.5000 mg | ORAL_TABLET | Freq: Two times a day (BID) | ORAL | 0 refills | Status: DC | PRN

## 2023-09-29 MED ORDER — POTASSIUM CHLORIDE CRYS ER 20 MEQ PO TBCR: 20.0000 meq | EXTENDED_RELEASE_TABLET | Freq: Every day | ORAL | Status: DC

## 2023-09-29 NOTE — Progress Notes (Signed)
Palliative: Chart review completed.  Jeff Miles has been accepted into [facility] for long-term care.  He is anticipated to discharge today. Face-to-face discussion with transition of care team related to patient condition, disposition.  No needs identified at this time.  Plan: Long-term care at [facility] rehab.  Would benefit from out patient palliative services.  No charge [name], NP Palliative medicine team Team phone [phone]

## 2023-09-29 NOTE — Progress Notes (Signed)
Multiple calls were made to [facility] to ensure that patient would be able to be accepted tonight at this hour since he was a new admit. This nurse, house supervisor, EMS workers all placed multiple calls and nobody would answer the phone. One paramedic stated that they it sounded as if somebody picked up the phone and then hung the phone up.  I spoke with the house supervisor about what to do with the patient  and the decision was made to keep the patient over night to avoid him possibly being brought back to the ER. I asked that they put the patient back on the list for early in the morning for transport.

## 2023-09-29 NOTE — Plan of Care (Signed)
Rested well during the night. Oxygen sats WNL while on 3l oxygen via nasal cannula. Alert but only oriented to self.  Problem: Health Behavior/Discharge Planning: Goal: Ability to manage health-related needs will improve Outcome: Progressing   Problem: Clinical Measurements: Goal: Ability to maintain clinical measurements within normal limits will improve Outcome: Progressing Goal: Will remain free from infection Outcome: Progressing Goal: Diagnostic test results will improve Outcome: Progressing Goal: Respiratory complications will improve Outcome: Progressing Goal: Cardiovascular complication will be avoided Outcome: Progressing   Problem: Activity: Goal: Risk for activity intolerance will decrease Outcome: Progressing   Problem: Nutrition: Goal: Adequate nutrition will be maintained Outcome: Progressing   Problem: Coping: Goal: Level of anxiety will decrease Outcome: Progressing   Problem: Elimination: Goal: Will not experience complications related to bowel motility Outcome: Progressing Goal: Will not experience complications related to urinary retention Outcome: Progressing   Problem: Pain Management: Goal: General experience of comfort will improve Outcome: Progressing   Problem: Safety: Goal: Ability to remain free from injury will improve Outcome: Progressing   Problem: Skin Integrity: Goal: Risk for impaired skin integrity will decrease Outcome: Progressing   Problem: Education: Goal: Ability to describe self-care measures that may prevent or decrease complications (Diabetes Survival Skills Education) will improve Outcome: Progressing Goal: Individualized Educational Video(s) Outcome: Progressing   Problem: Coping: Goal: Ability to adjust to condition or change in health will improve Outcome: Progressing   Problem: Fluid Volume: Goal: Ability to maintain a balanced intake and output will improve Outcome: Progressing   Problem: Health  Behavior/Discharge Planning: Goal: Ability to identify and utilize available resources and services will improve Outcome: Progressing Goal: Ability to manage health-related needs will improve Outcome: Progressing   Problem: Metabolic: Goal: Ability to maintain appropriate glucose levels will improve Outcome: Progressing   Problem: Nutritional: Goal: Maintenance of adequate nutrition will improve Outcome: Progressing Goal: Progress toward achieving an optimal weight will improve Outcome: Progressing   Problem: Skin Integrity: Goal: Risk for impaired skin integrity will decrease Outcome: Progressing   Problem: Tissue Perfusion: Goal: Adequacy of tissue perfusion will improve Outcome: Progressing

## 2023-09-29 NOTE — Care Management Important Message (Signed)
Important Message  Patient Details  Name: Jeff Miles MRN: [id number] Date of Birth: 01-21-1951   Important Message Given:  Yes - Medicare IM  Copy given to wife in room   Corey Harold 09/29/2023, 12:03 PM

## 2023-09-29 NOTE — Progress Notes (Signed)
Called patient's wife to advise her that patient was not transported to the facility tonight.  EMS tried to call facility multiple times when they arrived here to the hospital and no one there was answering the phone and EMS states that they say they cannot take patient's into the facility after 9:00 pm.  Pt's wife voiced understanding.

## 2023-09-29 NOTE — Discharge Summary (Signed)
Physician Discharge Summary   Patient: Jeff Miles MRN: [id number] DOB: 05/29/1951  Admit date:     09/16/2023  Discharge date: 09/29/23  Discharge Physician: Vassie Loll   PCP: Benita Stabile, MD   Recommendations at discharge:  Reassessed patient CBGs/A1c with further adjustment to hypoglycemia regimen as needed Repeat basic metabolic panel to follow ultralights renal function   Discharge Diagnoses: Principal Problem:   Acute metabolic encephalopathy Active Problems:   Bronchospasm   COPD (chronic obstructive pulmonary disease) (HCC)   Restlessness and agitation   Rectal bleeding   Heme + stool   Iron deficiency anemia, unspecified   Diabetes mellitus type 2 in nonobese (HCC)   GERD (gastroesophageal reflux disease)   Grade II hemorrhoids   AMS (altered mental status)   Weakness   Fall   Hypokalemia   Dementia with behavioral disturbance (HCC)   FTT (failure to thrive) in adult   Leukocytosis   Wernicke's encephalopathy   CHF (congestive heart failure) (HCC)   Hard of hearing   BPH (benign prostatic hyperplasia)   Unsteady gait   Weakness of both legs   Frequent falls  Brief Hospital admission course: As per H&P written by Dr. Laural Benes on 09/16/2023 Jeff Miles is a 73 year old male with advanced dementia associated with history of alcohol abuse and Korsakoff's encephalopathy, hypertension, COPD, former longtime smoker, iron deficiency anemia, type 2 diabetes mellitus, GAD, GERD, BPH with urinary retention, failure to thrive, frequent falls has had several recent hospitalizations most recently discharged on 09/03/2023 after being hospitalized for urinary retention, acute on chronic respiratory failure and metabolic encephalopathy and he was discharged home with home health services with his wife as primary caretaker.  She reports that for the past several days he has had increasing confusion and wandering at night and falling multiple times at home.  He had  canceled his outpatient appointment to have a colonoscopy on 09/28/2023.  This was part of a workup for guaiac positive stools and recent colonoscopy was incomplete due to poor prep.  Wife reports that it has been unsafe for him to remain at home because she is unable to care for him due to his frequent falls and nighttime wandering behavior.  She reports that he fell out of a chair this morning and struck his head on the floor.  He was discharged with a Foley catheter but has since been removed and he has been voiding per wife.  She reports that she is unable to care for him at home in his current state.  His ED trauma workup has been negative for acute findings.  He is being admitted for frequent falls and encephalopathy.    Assessment and Plan: 1)Acute Metabolic Encephalopathy - RESOLVED  - Patient appears to have underlying advanced dementia -No acute metabolic abnormalities found -Received thiamine -continue Fall precautions     2)Frequent Falls -Trauma workup in the ED negative for acute findings -Fall precautions recommended -PT/OT evaluation requested -- SNF recommended ---insurance declines SNF recommends long-term care memory unit   3)Generalized weakness and adult failure to thrive/social/ethics -Family tried caring for him at home but it has become too difficult for the wife to manage alone -Wife did have home health support but this has not been enough -Patient will need placement in a memory care facility Vs SNF -Wife reports home situation unsafe for patient at this time -Palliative care consult appreciated, patient is a DNR/DNI   4)Urinary retention -Foley catheter has been removed -Appears to be  voiding well -c/n  home tamsulosin daily   5)Hypotension - RESOLVED  - Continue metoprolol 37.5 mg twice daily-- Prior to admission patient was on 50 mg twice daily -Improved some with IV fluids which have completed  -Encourage adequate oral intake   6)COPD with chronic  bronchospasm -c/n  home bronchodilators -Scheduled bronchodilators ordered via nebulizer   7)Rectal bleeding with guaiac positive stool -Likely secondary to hemorrhoids seen on colonoscopy -Has been unable to have a colonoscopy due to poor prep and family recently canceled the outpatient procedure that was scheduled for 09/28/2023.   8)DM2-.  A1c 5.5 reflecting excellent diabetic control PTA -Holding PTA metformin -c/n glipizide at 2.5 mg with breakfast Use Novolog/Humalog Sliding scale insulin with Accu-Cheks/Fingersticks as ordered    9)Hypokalemia/hypomagnesemia - Replaced   10)Pseudomonas/E. coli UTI ----.Marland Kitchen afebrile  -COVID-negative and No leukocytosis 09/27/23 -Urine culture with Pseudomonas and E. coli microorganism isolated on urine Cx from 09/23/23 with Pseudomonas aeruginosa ---- -Initially treated with Rocephin, and received a couple doses of cefepime -okay to De-escalate to Cipro to complete antibiotic therapy (3 more days pending at discharge).   11) chronic hypoxic respiratory failure--- chest x-ray from 09/22/2022 without acute findings --Was dced home with home oxygen after recent hospitalization in December 2024 --Currently requiring 2 L of oxygen via nasal cannula -Continue bronchodilators and supportive care.  12) stage II pressure injury -Multiple spot affecting sacrum and buttocks bilaterally -Consult repositioning and preventive measures recommended -No signs of superimposed infection appreciated.  Consultants: None Procedures performed: See below for x-ray report. Disposition: Skilled nursing facility Diet recommendation: Heart healthy/modified carbohydrate diet.  DISCHARGE MEDICATION: Allergies as of 09/29/2023   No Known Allergies      Medication List     STOP taking these medications    pioglitazone 30 MG tablet Commonly known as: ACTOS       TAKE these medications    acetaminophen 500 MG tablet Commonly known as: TYLENOL Take 1 tablet  (500 mg total) by mouth every 6 (six) hours as needed for mild pain (pain score 1-3) (or Fever >/= 101).   albuterol (2.5 MG/3ML) 0.083% nebulizer solution Commonly known as: PROVENTIL Take 2.5 mg by nebulization 3 (three) times daily.   PROAIR HFA IN Inhale 2 puffs into the lungs daily.   atorvastatin 80 MG tablet Commonly known as: LIPITOR Take 80 mg by mouth daily.   ciprofloxacin 500 MG tablet Commonly known as: CIPRO Take 1 tablet (500 mg total) by mouth 2 (two) times daily for 3 days.   cyanocobalamin 1000 MCG tablet Take 1 tablet (1,000 mcg total) by mouth daily.   Fish Oil 1000 MG Caps Take 1,000 mg by mouth daily.   furosemide 40 MG tablet Commonly known as: LASIX Take 1 tablet (40 mg total) by mouth daily.   Fusion Plus Caps Take 1 capsule by mouth daily.   glipiZIDE 5 MG 24 hr tablet Commonly known as: GLUCOTROL XL Take 5 mg by mouth 2 (two) times daily.   LORazepam 0.5 MG tablet Commonly known as: ATIVAN Take 1 tablet (0.5 mg total) by mouth every 12 (twelve) hours as needed for anxiety. What changed:  medication strength how much to take when to take this reasons to take this   metFORMIN 1000 MG tablet Commonly known as: GLUCOPHAGE Take 1,000 mg by mouth 2 (two) times daily.   metoprolol tartrate 50 MG tablet Commonly known as: LOPRESSOR Take 1 tablet (50 mg total) by mouth 2 (two) times daily.   mirtazapine  15 MG tablet Commonly known as: REMERON Take 15 mg by mouth at bedtime.   multivitamin with minerals Tabs tablet Take 1 tablet by mouth daily.   naltrexone 50 MG tablet Commonly known as: DEPADE Take 50 mg by mouth daily.   omeprazole 20 MG capsule Commonly known as: PRILOSEC Take 20 mg by mouth daily.   potassium chloride SA 20 MEQ tablet Commonly known as: KLOR-CON M Take 1 tablet (20 mEq total) by mouth daily.   risperiDONE 1 MG tablet Commonly known as: RISPERDAL Take 1 tablet (1 mg total) by mouth at bedtime.   SOOTHE XP  OP Apply 1 drop to eye daily as needed (dry eyes).   tamsulosin 0.4 MG Caps capsule Commonly known as: FLOMAX Take 1 capsule (0.4 mg total) by mouth daily.   Trelegy Ellipta 100-62.5-25 MCG/INH Aepb Generic drug: Fluticasone-Umeclidin-Vilant Inhale 1 puff into the lungs daily.   vitamin B-1 250 MG tablet Take 250 mg by mouth daily.               Discharge Care Instructions  (From admission, onward)           Start     Ordered   09/29/23 0000  Discharge wound care:       Comments: Keep areas clean and dry; constant repositioning and preventive measures.   09/29/23 1110            Follow-up Information     Benita Stabile, MD. Schedule an appointment as soon as possible for a visit in 2 week(s).   Specialty: Internal Medicine Contact information: [address] [place] [address] [phone]                Discharge Exam: Filed Weights   09/27/23 0337 09/28/23 0315 09/29/23 0500  Weight: 86.8 kg 88 kg 88.3 kg   General exam: Alert, awake, and intermittently following commands.  No acute distress.  Feeling ready for discharge. Respiratory system: Scattered rhonchi and mild expiratory wheezing appreciated on exam.  Chronic 2 L nasal cannula supplementation in place with good saturation.  Normal respiratory effort appreciated. Cardiovascular system:RRR.  No rubs or gallops. Gastrointestinal system: Abdomen is nondistended, soft and nontender.  Positive bowel sounds appreciated. Central nervous system: Generally weak; moving 4 limbs spontaneously.  No new focal neurological deficits. Extremities: No cyanosis appreciated; positive chronic stasis dermatitis changes. Skin: No petechiae.  Stage II pressure injury (multiple spots in his sacrum and buttocks area) present at time of admission and without signs of superimposed infection. Psychiatry: Judgement and insight appear impaired secondary to underlying dementia.  Flat affect.    Condition at  discharge: Stable.  The results of significant diagnostics from this hospitalization (including imaging, microbiology, ancillary and laboratory) are listed below for reference.   Imaging Studies: DG CHEST PORT 1 VIEW Result Date: 09/25/2023 CLINICAL DATA:  [id number] Dyspnea and respiratory abnormalities [id number] EXAM: PORTABLE CHEST 1 VIEW COMPARISON:  Chest x-ray 09/23/2023, CT chest 09/16/2023 FINDINGS: The heart and mediastinal contours are unchanged. Aortic calcification. No focal consolidation. Chronic coarsened interstitial markings with no overt pulmonary edema. No pleural effusion. No pneumothorax. No acute osseous abnormality. IMPRESSION: 1. No active disease. 2. Aortic Atherosclerosis (ICD10-I70.0) and Emphysema (ICD10-J43.9). Electronically Signed   By: Tish Frederickson M.D.   On: 09/25/2023 18:10   DG Chest 1 View Result Date: 09/23/2023 CLINICAL DATA:  Fevers EXAM: PORTABLE CHEST 1 VIEW COMPARISON:  09/16/2023 FINDINGS: The heart size and mediastinal contours are within normal limits.  Both lungs are clear. The visualized skeletal structures are unremarkable. IMPRESSION: No active disease. Electronically Signed   By: Alcide Clever M.D.   On: 09/23/2023 00:37   CT ABDOMEN PELVIS W CONTRAST Result Date: 09/16/2023 CLINICAL DATA:  Status post fall with head strike and altered mental status. EXAM: CT ANGIOGRAPHY CHEST CT ABDOMEN AND PELVIS WITH CONTRAST TECHNIQUE: Multidetector CT imaging of the chest was performed using the standard protocol during bolus administration of intravenous contrast. Multiplanar CT image reconstructions and MIPs were obtained to evaluate the vascular anatomy. Multidetector CT imaging of the abdomen and pelvis was performed using the standard protocol during bolus administration of intravenous contrast. RADIATION DOSE REDUCTION: This exam was performed according to the departmental dose-optimization program which includes automated exposure control, adjustment of the mA and/or  kV according to patient size and/or use of iterative reconstruction technique. CONTRAST:  OMNIPAQUE IOHEXOL 350 MG/ML SOLN COMPARISON:  Same day chest radiograph FINDINGS: CTA CHEST FINDINGS Cardiovascular: The study is acceptable for the evaluation of pulmonary embolism, although there is motion artifact. There are no filling defects in the central, lobar, segmental or subsegmental pulmonary artery branches to suggest acute pulmonary embolism. Great vessels are normal in course and caliber. Normal heart size. No significant pericardial fluid/thickening. Coronary artery calcifications. Mediastinum/Nodes: Imaged thyroid gland without nodules meeting criteria for imaging follow-up by size. Normal esophagus. No pathologically enlarged axillary, supraclavicular, mediastinal, or hilar lymph nodes. Lungs/Pleura: The central airways are patent. Mild-to-moderate upper lobe predominant centrilobular emphysema. Moderate diffuse bronchial wall thickening with right lower lobe subsegmental mucous plugging. Bilateral lower lobe subsegmental atelectasis. Irregular medial left upper lobe nodule measures 1.3 x 0.7 cm (6:39). Right apical 3 mm nodule (6:39). No pneumothorax. No pleural effusion. Musculoskeletal: No acute or abnormal lytic or blastic osseous lesions. Multilevel degenerative changes of the thoracic spine. Review of the MIP images confirms the above findings. CT ABDOMEN and PELVIS FINDINGS Hepatobiliary: No focal hepatic lesions. No intra or extrahepatic biliary ductal dilation. Normal gallbladder. Pancreas: No focal lesions or main ductal dilation. Spleen: Normal in size without focal abnormality. Adrenals/Urinary Tract: 1.4 cm left adrenal nodule measures 57 HU. No right adrenal nodule. No suspicious renal mass, calculi, or hydronephrosis. Distended urinary bladder. Punctate focus of intraluminal gas. Stomach/Bowel: Normal appearance of the stomach. No evidence of bowel wall thickening, distention, or  inflammatory changes. Colonic diverticulosis without acute diverticulitis. Normal appendix. Vascular/Lymphatic: Aortic atherosclerosis. No enlarged abdominal or pelvic lymph nodes. Reproductive: Heterogeneous enhancement of the prostate gland. Rounded hypoattenuating focus within the right hemi gland measures 1.5 cm (2:79), possibly a BPH nodule. Other: No free fluid, fluid collection, or free air. Musculoskeletal: No acute or abnormal lytic or blastic osseous lesions. Multilevel degenerative changes of the lumbar spine. IMPRESSION: CTA CHEST: 1. No evidence of pulmonary embolism. 2. Irregular medial left upper lobe nodule measures 1.3 x 0.7 cm. Consider one of the following in 3 months for both low-risk and high-risk individuals: (a) repeat chest CT, (b) follow-up PET-CT, or (c) tissue sampling. This recommendation follows the consensus statement: Guidelines for Management of Incidental Pulmonary Nodules Detected on CT Images: From the Fleischner Society 2017; Radiology 2017; 284:228-243. 3. Moderate diffuse bronchial wall thickening with right lower lobe subsegmental mucous plugging, as can be seen in the setting of bronchitis. CT ABDOMEN AND PELVIS: 1. No acute abnormality in the abdomen or pelvis. 2. Distended urinary bladder with punctate focus of intraluminal gas, which may be related to recent instrumentation. Correlate with urinalysis to exclude cystitis. 3. Heterogeneous  enhancement of the prostate gland. Rounded hypoattenuating focus within the right hemi gland measures 1.5 cm, possibly a BPH nodule. Recommend correlation with PSA. 4. Indeterminate 1.4 cm left adrenal nodule. Recommend further evaluation with adrenal protocol CT or MRI. 5. Aortic Atherosclerosis (ICD10-I70.0) and Emphysema (ICD10-J43.9). Coronary artery calcifications. Assessment for potential risk factor modification, dietary therapy or pharmacologic therapy may be warranted, if clinically indicated. Electronically Signed   By: Agustin Cree  M.D.   On: 09/16/2023 13:17   CT Angio Chest PE W/Cm &/Or Wo Cm Result Date: 09/16/2023 CLINICAL DATA:  Status post fall with head strike and altered mental status. EXAM: CT ANGIOGRAPHY CHEST CT ABDOMEN AND PELVIS WITH CONTRAST TECHNIQUE: Multidetector CT imaging of the chest was performed using the standard protocol during bolus administration of intravenous contrast. Multiplanar CT image reconstructions and MIPs were obtained to evaluate the vascular anatomy. Multidetector CT imaging of the abdomen and pelvis was performed using the standard protocol during bolus administration of intravenous contrast. RADIATION DOSE REDUCTION: This exam was performed according to the departmental dose-optimization program which includes automated exposure control, adjustment of the mA and/or kV according to patient size and/or use of iterative reconstruction technique. CONTRAST:  OMNIPAQUE IOHEXOL 350 MG/ML SOLN COMPARISON:  Same day chest radiograph FINDINGS: CTA CHEST FINDINGS Cardiovascular: The study is acceptable for the evaluation of pulmonary embolism, although there is motion artifact. There are no filling defects in the central, lobar, segmental or subsegmental pulmonary artery branches to suggest acute pulmonary embolism. Great vessels are normal in course and caliber. Normal heart size. No significant pericardial fluid/thickening. Coronary artery calcifications. Mediastinum/Nodes: Imaged thyroid gland without nodules meeting criteria for imaging follow-up by size. Normal esophagus. No pathologically enlarged axillary, supraclavicular, mediastinal, or hilar lymph nodes. Lungs/Pleura: The central airways are patent. Mild-to-moderate upper lobe predominant centrilobular emphysema. Moderate diffuse bronchial wall thickening with right lower lobe subsegmental mucous plugging. Bilateral lower lobe subsegmental atelectasis. Irregular medial left upper lobe nodule measures 1.3 x 0.7 cm (6:39). Right apical 3 mm nodule  (6:39). No pneumothorax. No pleural effusion. Musculoskeletal: No acute or abnormal lytic or blastic osseous lesions. Multilevel degenerative changes of the thoracic spine. Review of the MIP images confirms the above findings. CT ABDOMEN and PELVIS FINDINGS Hepatobiliary: No focal hepatic lesions. No intra or extrahepatic biliary ductal dilation. Normal gallbladder. Pancreas: No focal lesions or main ductal dilation. Spleen: Normal in size without focal abnormality. Adrenals/Urinary Tract: 1.4 cm left adrenal nodule measures 57 HU. No right adrenal nodule. No suspicious renal mass, calculi, or hydronephrosis. Distended urinary bladder. Punctate focus of intraluminal gas. Stomach/Bowel: Normal appearance of the stomach. No evidence of bowel wall thickening, distention, or inflammatory changes. Colonic diverticulosis without acute diverticulitis. Normal appendix. Vascular/Lymphatic: Aortic atherosclerosis. No enlarged abdominal or pelvic lymph nodes. Reproductive: Heterogeneous enhancement of the prostate gland. Rounded hypoattenuating focus within the right hemi gland measures 1.5 cm (2:79), possibly a BPH nodule. Other: No free fluid, fluid collection, or free air. Musculoskeletal: No acute or abnormal lytic or blastic osseous lesions. Multilevel degenerative changes of the lumbar spine. IMPRESSION: CTA CHEST: 1. No evidence of pulmonary embolism. 2. Irregular medial left upper lobe nodule measures 1.3 x 0.7 cm. Consider one of the following in 3 months for both low-risk and high-risk individuals: (a) repeat chest CT, (b) follow-up PET-CT, or (c) tissue sampling. This recommendation follows the consensus statement: Guidelines for Management of Incidental Pulmonary Nodules Detected on CT Images: From the Fleischner Society 2017; Radiology 2017; 284:228-243. 3. Moderate diffuse bronchial  wall thickening with right lower lobe subsegmental mucous plugging, as can be seen in the setting of bronchitis. CT ABDOMEN AND  PELVIS: 1. No acute abnormality in the abdomen or pelvis. 2. Distended urinary bladder with punctate focus of intraluminal gas, which may be related to recent instrumentation. Correlate with urinalysis to exclude cystitis. 3. Heterogeneous enhancement of the prostate gland. Rounded hypoattenuating focus within the right hemi gland measures 1.5 cm, possibly a BPH nodule. Recommend correlation with PSA. 4. Indeterminate 1.4 cm left adrenal nodule. Recommend further evaluation with adrenal protocol CT or MRI. 5. Aortic Atherosclerosis (ICD10-I70.0) and Emphysema (ICD10-J43.9). Coronary artery calcifications. Assessment for potential risk factor modification, dietary therapy or pharmacologic therapy may be warranted, if clinically indicated. Electronically Signed   By: Agustin Cree M.D.   On: 09/16/2023 13:17   CT Head Wo Contrast Result Date: 09/16/2023 CLINICAL DATA:  Head trauma, minor (Age >= 65y); Neck trauma (Age >= 65y) EXAM: CT HEAD WITHOUT CONTRAST CT CERVICAL SPINE WITHOUT CONTRAST TECHNIQUE: Multidetector CT imaging of the head and cervical spine was performed following the standard protocol without intravenous contrast. Multiplanar CT image reconstructions of the cervical spine were also generated. RADIATION DOSE REDUCTION: This exam was performed according to the departmental dose-optimization program which includes automated exposure control, adjustment of the mA and/or kV according to patient size and/or use of iterative reconstruction technique. COMPARISON:  None Available. FINDINGS: CT HEAD FINDINGS Brain: No hemorrhage. No hydrocephalus. No extra-axial fluid collection. No CT evidence of an acute cortical infarct. No mass effect. No mass lesion. Vascular: No hyperdense vessel or unexpected calcification. Skull: Normal. Negative for fracture or focal lesion. Sinuses/Orbits: No middle ear or mastoid effusion. Paranasal sinuses notable for mild mucosal thickening in the left maxillary sinus. Orbits are  unremarkable. Other: None. CT CERVICAL SPINE FINDINGS Alignment: Normal. Skull base and vertebrae: No acute fracture. No primary bone lesion or focal pathologic process. Soft tissues and spinal canal: No prevertebral fluid or swelling. No visible canal hematoma. Disc levels:  No CT evidence of high-grade spinal canal stenosis Upper chest: Negative. Other: None IMPRESSION: 1. No CT evidence of intracranial injury. 2. No acute fracture or traumatic subluxation of the cervical spine. Electronically Signed   By: Lorenza Cambridge M.D.   On: 09/16/2023 13:03   CT Cervical Spine Wo Contrast Result Date: 09/16/2023 CLINICAL DATA:  Head trauma, minor (Age >= 65y); Neck trauma (Age >= 65y) EXAM: CT HEAD WITHOUT CONTRAST CT CERVICAL SPINE WITHOUT CONTRAST TECHNIQUE: Multidetector CT imaging of the head and cervical spine was performed following the standard protocol without intravenous contrast. Multiplanar CT image reconstructions of the cervical spine were also generated. RADIATION DOSE REDUCTION: This exam was performed according to the departmental dose-optimization program which includes automated exposure control, adjustment of the mA and/or kV according to patient size and/or use of iterative reconstruction technique. COMPARISON:  None Available. FINDINGS: CT HEAD FINDINGS Brain: No hemorrhage. No hydrocephalus. No extra-axial fluid collection. No CT evidence of an acute cortical infarct. No mass effect. No mass lesion. Vascular: No hyperdense vessel or unexpected calcification. Skull: Normal. Negative for fracture or focal lesion. Sinuses/Orbits: No middle ear or mastoid effusion. Paranasal sinuses notable for mild mucosal thickening in the left maxillary sinus. Orbits are unremarkable. Other: None. CT CERVICAL SPINE FINDINGS Alignment: Normal. Skull base and vertebrae: No acute fracture. No primary bone lesion or focal pathologic process. Soft tissues and spinal canal: No prevertebral fluid or swelling. No visible  canal hematoma. Disc levels:  No CT evidence  of high-grade spinal canal stenosis Upper chest: Negative. Other: None IMPRESSION: 1. No CT evidence of intracranial injury. 2. No acute fracture or traumatic subluxation of the cervical spine. Electronically Signed   By: Lorenza Cambridge M.D.   On: 09/16/2023 13:03   DG Chest Portable 1 View Result Date: 09/16/2023 CLINICAL DATA:  Fall. EXAM: PORTABLE CHEST 1 VIEW COMPARISON:  09/02/2023. FINDINGS: Bilateral lung fields are clear. Bilateral costophrenic angles are clear. Normal cardio-mediastinal silhouette. No acute osseous abnormalities. The soft tissues are within normal limits. IMPRESSION: No active disease. Electronically Signed   By: Jules Schick M.D.   On: 09/16/2023 11:29   DG Pelvis Portable Result Date: 09/16/2023 CLINICAL DATA:  Fall. EXAM: PORTABLE PELVIS 1-2 VIEWS COMPARISON:  None Available. FINDINGS: Pelvis is intact with normal and symmetric sacroiliac joints. No acute fracture or dislocation. No aggressive osseous lesion. Visualized sacral arcuate lines are unremarkable. Unremarkable symphysis pubis. There are mild degenerative changes of bilateral hip joints without significant joint space narrowing. Osteophytosis of the superior acetabulum. No radiopaque foreign bodies. IMPRESSION: *No acute osseous abnormality of the pelvis. Electronically Signed   By: Jules Schick M.D.   On: 09/16/2023 11:26   DG CHEST PORT 1 VIEW Result Date: 09/02/2023 CLINICAL DATA:  Dyspnea. EXAM: PORTABLE CHEST 1 VIEW COMPARISON:  August 30, 2023. FINDINGS: The heart size and mediastinal contours are within normal limits. Both lungs are clear. The visualized skeletal structures are unremarkable. IMPRESSION: No active disease. Electronically Signed   By: Lupita Raider M.D.   On: 09/02/2023 14:45   ECHOCARDIOGRAM COMPLETE Result Date: 09/01/2023    ECHOCARDIOGRAM REPORT   Patient Name:   MAYER LAKATOS Date of Exam: 09/01/2023 Medical Rec #:  [id number]           Height:       69.0 in Accession #:    [id number]         Weight:       218.0 lb Date of Birth:  [birth year]         BSA:          2.143 m Patient Age:    72 years           BP:           107/60 mmHg Patient Gender: M                  HR:           83 bpm. [facility] Procedure: 2D Echo, Cardiac Doppler and Color Doppler Indications:    Congestive Heart Failure I50.9  History:        Patient has prior history of Echocardiogram examinations, most                 recent 10/25/2015. CHF, COPD; Risk Factors:Hypertension and                 Diabetes. AMS (altered mental status), Alcohol withdrawal (HCC).  Sonographer:    Celesta Gentile RCS Referring Phys: [id number] CHELSEY L ANDERSON IMPRESSIONS  1. Left ventricular ejection fraction, by estimation, is 60 to 65%. The left ventricle has normal function. The left ventricle has no regional wall motion abnormalities. Left ventricular diastolic parameters were normal.  2. Right ventricular systolic function is normal. The right ventricular size is normal. Tricuspid regurgitation signal is inadequate for assessing PA pressure.  3. The mitral valve is normal in structure. No evidence of mitral valve regurgitation. No evidence of  mitral stenosis.  4. The aortic valve has an indeterminant number of cusps. Aortic valve regurgitation is not visualized. Mild aortic valve stenosis. Aortic valve mean gradient measures 14.0 mmHg. Aortic valve Vmax measures 2.44 m/s.  5. The inferior vena cava is dilated in size with >50% respiratory variability, suggesting right atrial pressure of 8 mmHg. Comparison(s): No prior Echocardiogram. FINDINGS  Left Ventricle: Left ventricular ejection fraction, by estimation, is 60 to 65%. The left ventricle has normal function. The left ventricle has no regional wall motion abnormalities. The left ventricular internal cavity size was normal in size. There is  no left ventricular hypertrophy. Left ventricular diastolic parameters were normal.  Right Ventricle: The right ventricular size is normal. No increase in right ventricular wall thickness. Right ventricular systolic function is normal. Tricuspid regurgitation signal is inadequate for assessing PA pressure. Left Atrium: Left atrial size was normal in size. Right Atrium: Right atrial size was normal in size. Pericardium: There is no evidence of pericardial effusion. Mitral Valve: The mitral valve is normal in structure. No evidence of mitral valve regurgitation. No evidence of mitral valve stenosis. Tricuspid Valve: The tricuspid valve is normal in structure. Tricuspid valve regurgitation is not demonstrated. No evidence of tricuspid stenosis. Aortic Valve: The aortic valve has an indeterminant number of cusps. Aortic valve regurgitation is not visualized. Mild aortic stenosis is present. Aortic valve mean gradient measures 14.0 mmHg. Aortic valve peak gradient measures 23.8 mmHg. Aortic valve  area, by VTI measures 1.74 cm. Pulmonic Valve: The pulmonic valve was not well visualized. Pulmonic valve regurgitation is not visualized. No evidence of pulmonic stenosis. Aorta: The aortic root is normal in size and structure. Venous: The inferior vena cava is dilated in size with greater than 50% respiratory variability, suggesting right atrial pressure of 8 mmHg. IAS/Shunts: No atrial level shunt detected by color flow Doppler.  LEFT VENTRICLE PLAX 2D LVIDd:         5.15 cm   Diastology LVIDs:         3.40 cm   LV e' medial:    8.70 cm/s LV PW:         1.10 cm   LV E/e' medial:  11.4 LV IVS:        1.10 cm   LV e' lateral:   7.40 cm/s LVOT diam:     2.30 cm   LV E/e' lateral: 13.4 LV SV:         91 LV SV Index:   42 LVOT Area:     4.15 cm  RIGHT VENTRICLE RV S prime:     10.20 cm/s TAPSE (M-mode): 2.0 cm LEFT ATRIUM             Index        RIGHT ATRIUM           Index LA diam:        3.80 cm 1.77 cm/m   RA Area:     20.70 cm LA Vol (A2C):   86.6 ml 40.41 ml/m  RA Volume:   60.90 ml  28.42 ml/m LA Vol  (A4C):   57.1 ml 26.64 ml/m LA Biplane Vol: 71.5 ml 33.36 ml/m  AORTIC VALVE AV Area (Vmax):    1.80 cm AV Area (Vmean):   1.72 cm AV Area (VTI):     1.74 cm AV Vmax:           244.00 cm/s AV Vmean:          170.500 cm/s  AV VTI:            0.523 m AV Peak Grad:      23.8 mmHg AV Mean Grad:      14.0 mmHg LVOT Vmax:         105.65 cm/s LVOT Vmean:        70.550 cm/s LVOT VTI:          0.218 m LVOT/AV VTI ratio: 0.42  AORTA Ao Root diam: 3.30 cm MITRAL VALVE MV Area (PHT): 2.95 cm    SHUNTS MV Decel Time: 257 msec    Systemic VTI:  0.22 m MV E velocity: 99.00 cm/s  Systemic Diam: 2.30 cm MV A velocity: 95.10 cm/s MV E/A ratio:  1.04 Vishnu Priya Mallipeddi Electronically signed by Winfield Rast Mallipeddi Signature Date/Time: 09/01/2023/2:58:45 PM    Final    MR BRAIN W WO CONTRAST Result Date: 08/31/2023 CLINICAL DATA:  Stroke suspected EXAM: MRI HEAD WITHOUT AND WITH CONTRAST TECHNIQUE: Multiplanar, multiecho pulse sequences of the brain and surrounding structures were obtained without and with intravenous contrast. CONTRAST:  10mL GADAVIST GADOBUTROL 1 MMOL/ML IV SOLN COMPARISON:  None Available. FINDINGS: Brain: No restricted diffusion to suggest acute or subacute infarct. No abnormal parenchymal or meningeal enhancement. No acute hemorrhage, mass, mass effect, or midline shift. No hydrocephalus or extra-axial collection. Pituitary and craniocervical junction within normal limits. No hemosiderin deposition to suggest remote hemorrhage. Scattered T2 hyperintense signal in the periventricular white matter, likely the sequela of mild-to-moderate chronic small vessel ischemic disease. Mildly advanced central volume loss for age. Vascular: Normal arterial flow voids. Normal arterial and venous enhancement. Skull and upper cervical spine: Normal marrow signal. Sinuses/Orbits: Mucosal thickening in the ethmoid air cells and left sphenoid sinus. No acute finding in the orbits. Other: The mastoid air cells are  well aerated. IMPRESSION: No acute intracranial process. No evidence of acute or subacute infarct. Electronically Signed   By: Wiliam Ke M.D.   On: 08/31/2023 11:34   DG HIPS BILAT WITH PELVIS MIN 5 VIEWS Result Date: 08/30/2023 CLINICAL DATA:  [id number] Fall [id number].  Difficulty walking. EXAM: DG HIP (WITH OR WITHOUT PELVIS) 5+V BILAT COMPARISON:  None Available. FINDINGS: Pelvis is intact with normal and symmetric sacroiliac joints. No acute fracture or dislocation. No aggressive osseous lesion. Probable old/healed right greater trochanter avulsion fracture noted. Visualized sacral arcuate lines are unremarkable. There are changes of chronic pubic symphisitis. There are mild degenerative changes of bilateral hip joints without significant joint space narrowing. Osteophytosis of the superior acetabulum. No radiopaque foreign bodies. IMPRESSION: *No acute osseous abnormality of the pelvis or bilateral hip joints. Electronically Signed   By: Jules Schick M.D.   On: 08/30/2023 14:08   DG Chest 1 View Result Date: 08/30/2023 CLINICAL DATA:  Weakness.  Frequent falls and difficulty walking. EXAM: CHEST  1 VIEW COMPARISON:  11/03/2015. FINDINGS: Bilateral lung fields are clear. Bilateral costophrenic angles are clear. Normal cardio-mediastinal silhouette. No acute osseous abnormalities. The soft tissues are within normal limits. IMPRESSION: No active disease. Electronically Signed   By: Jules Schick M.D.   On: 08/30/2023 14:06   CT Head Wo Contrast Result Date: 08/30/2023 CLINICAL DATA:  Frequent falls and difficulty walking EXAM: CT HEAD WITHOUT CONTRAST TECHNIQUE: Contiguous axial images were obtained from the base of the skull through the vertex without intravenous contrast. RADIATION DOSE REDUCTION: This exam was performed according to the departmental dose-optimization program which includes automated exposure control, adjustment of the mA and/or kV according to patient  size and/or use of iterative  reconstruction technique. COMPARISON:  None Available. FINDINGS: Brain: No evidence of acute infarction, hemorrhage, mass, mass effect, or midline shift. No hydrocephalus or extra-axial fluid collection. Vascular: No hyperdense vessel. Skull: Negative for fracture or focal lesion. Sinuses/Orbits: Mucosal thickening in the left sphenoid sinus. No acute finding in the orbits. Fluid in the pneumatized portion of the bilateral mastoid air cells, as well as in the left greater than right middle ear. IMPRESSION: 1. No acute intracranial process. 2. Fluid in the pneumatized portion of the bilateral mastoid air cells, as well as in the left greater than right middle ear. Correlate for symptoms of otomastoiditis or otitis media. Electronically Signed   By: Wiliam Ke M.D.   On: 08/30/2023 12:34    Microbiology: Results for orders placed or performed during the hospital encounter of 09/16/23  Resp panel by RT-PCR (RSV, Flu A&B, Covid) Anterior Nasal Swab     Status: None   Collection Time: 09/16/23 10:18 AM   Specimen: Anterior Nasal Swab  Result Value Ref Range Status   SARS Coronavirus 2 by RT PCR NEGATIVE NEGATIVE Final    Comment: (NOTE) SARS-CoV-2 target nucleic acids are NOT DETECTED.  The SARS-CoV-2 RNA is generally detectable in upper respiratory specimens during the acute phase of infection. The lowest concentration of SARS-CoV-2 viral copies this assay can detect is 138 copies/mL. A negative result does not preclude SARS-Cov-2 infection and should not be used as the sole basis for treatment or other patient management decisions. A negative result may occur with  improper specimen collection/handling, submission of specimen other than nasopharyngeal swab, presence of viral mutation(s) within the areas targeted by this assay, and inadequate number of viral copies(<138 copies/mL). A negative result must be combined with clinical observations, patient history, and epidemiological information.  The expected result is Negative.  Fact Sheet for Patients:  BloggerCourse.com  Fact Sheet for Healthcare Providers:  SeriousBroker.it  This test is no t yet approved or cleared by the [place] FDA and  has been authorized for detection and/or diagnosis of SARS-CoV-2 by FDA under an Emergency Use Authorization (EUA). This EUA will remain  in effect (meaning this test can be used) for the duration of the COVID-19 declaration under Section 564(b)(1) of the Act, 21 U.S.C.section 360bbb-3(b)(1), unless the authorization is terminated  or revoked sooner.       Influenza A by PCR NEGATIVE NEGATIVE Final   Influenza B by PCR NEGATIVE NEGATIVE Final    Comment: (NOTE) The Xpert Xpress SARS-CoV-2/FLU/RSV plus assay is intended as an aid in the diagnosis of influenza from Nasopharyngeal swab specimens and should not be used as a sole basis for treatment. Nasal washings and aspirates are unacceptable for Xpert Xpress SARS-CoV-2/FLU/RSV testing.  Fact Sheet for Patients: BloggerCourse.com  Fact Sheet for Healthcare Providers: SeriousBroker.it  This test is not yet approved or cleared by the [place] FDA and has been authorized for detection and/or diagnosis of SARS-CoV-2 by FDA under an Emergency Use Authorization (EUA). This EUA will remain in effect (meaning this test can be used) for the duration of the COVID-19 declaration under Section 564(b)(1) of the Act, 21 U.S.C. section 360bbb-3(b)(1), unless the authorization is terminated or revoked.     Resp Syncytial Virus by PCR NEGATIVE NEGATIVE Final    Comment: (NOTE) Fact Sheet for Patients: BloggerCourse.com  Fact Sheet for Healthcare Providers: SeriousBroker.it  This test is not yet approved or cleared by the [place] and has been authorized  for detection and/or  diagnosis of SARS-CoV-2 by FDA under an Emergency Use Authorization (EUA). This EUA will remain in effect (meaning this test can be used) for the duration of the COVID-19 declaration under Section 564(b)(1) of the Act, 21 U.S.C. section 360bbb-3(b)(1), unless the authorization is terminated or revoked.  Performed at [facility], [address], [place], [place] [address]   Blood culture (routine x 2)     Status: None   Collection Time: 09/16/23 10:20 AM   Specimen: BLOOD  Result Value Ref Range Status   Specimen Description BLOOD BLOOD RIGHT ARM FOREARM  Final   Special Requests   Final    BOTTLES DRAWN AEROBIC AND ANAEROBIC Blood Culture adequate volume   Culture   Final    NO GROWTH 5 DAYS Performed at [facility], [address] [place], [place] [address]    Report Status 09/21/2023 FINAL  Final  Blood culture (routine x 2)     Status: None   Collection Time: 09/16/23 10:20 AM   Specimen: BLOOD  Result Value Ref Range Status   Specimen Description BLOOD BLOOD RIGHT ARM RAC  Final   Special Requests   Final    BOTTLES DRAWN AEROBIC AND ANAEROBIC Blood Culture adequate volume   Culture   Final    NO GROWTH 5 DAYS Performed at [facility], [address], [place], [place] [address]    Report Status 09/21/2023 FINAL  Final  Respiratory (~20 pathogens) panel by PCR     Status: None   Collection Time: 09/23/23 12:14 AM   Specimen: Nasopharyngeal Swab; Respiratory  Result Value Ref Range Status   Adenovirus NOT DETECTED NOT DETECTED Final   Coronavirus 229E NOT DETECTED NOT DETECTED Final    Comment: (NOTE) The Coronavirus on the Respiratory Panel, DOES NOT test for the novel  Coronavirus (2019 nCoV)    Coronavirus HKU1 NOT DETECTED NOT DETECTED Final   Coronavirus NL63 NOT DETECTED NOT DETECTED Final   Coronavirus OC43 NOT DETECTED NOT DETECTED Final   Metapneumovirus NOT DETECTED NOT DETECTED Final   Rhinovirus / Enterovirus NOT DETECTED NOT DETECTED Final   Influenza A  NOT DETECTED NOT DETECTED Final   Influenza B NOT DETECTED NOT DETECTED Final   Parainfluenza Virus 1 NOT DETECTED NOT DETECTED Final   Parainfluenza Virus 2 NOT DETECTED NOT DETECTED Final   Parainfluenza Virus 3 NOT DETECTED NOT DETECTED Final   Parainfluenza Virus 4 NOT DETECTED NOT DETECTED Final   Respiratory Syncytial Virus NOT DETECTED NOT DETECTED Final   Bordetella pertussis NOT DETECTED NOT DETECTED Final   Bordetella Parapertussis NOT DETECTED NOT DETECTED Final   Chlamydophila pneumoniae NOT DETECTED NOT DETECTED Final   Mycoplasma pneumoniae NOT DETECTED NOT DETECTED Final    Comment: Performed at [facility], [address], [place], [place] [address]  SARS Coronavirus 2 by RT PCR (hospital order, performed in [facility] hospital lab) *cepheid single result test* Anterior Nasal Swab     Status: None   Collection Time: 09/23/23 12:16 AM   Specimen: Anterior Nasal Swab  Result Value Ref Range Status   SARS Coronavirus 2 by RT PCR NEGATIVE NEGATIVE Final    Comment: (NOTE) SARS-CoV-2 target nucleic acids are NOT DETECTED.  The SARS-CoV-2 RNA is generally detectable in upper and lower respiratory specimens during the acute phase of infection. The lowest concentration of SARS-CoV-2 viral copies this assay can detect is 250 copies / mL. A negative result does not preclude SARS-CoV-2 infection and  should not be used as the sole basis for treatment or other patient management decisions.  A negative result may occur with improper specimen collection / handling, submission of specimen other than nasopharyngeal swab, presence of viral mutation(s) within the areas targeted by this assay, and inadequate number of viral copies (<250 copies / mL). A negative result must be combined with clinical observations, patient history, and epidemiological information.  Fact Sheet for Patients:   RoadLapTop.co.za  Fact Sheet for Healthcare  Providers: http://kim-miller.com/  This test is not yet approved or  cleared by the [place] FDA and has been authorized for detection and/or diagnosis of SARS-CoV-2 by FDA under an Emergency Use Authorization (EUA).  This EUA will remain in effect (meaning this test can be used) for the duration of the COVID-19 declaration under Section 564(b)(1) of the Act, 21 U.S.C. section 360bbb-3(b)(1), unless the authorization is terminated or revoked sooner.  Performed at [facility], [address], [place], [place] [address]   Culture, blood (Routine X 2) w Reflex to ID Panel     Status: None   Collection Time: 09/23/23  1:09 AM   Specimen: BLOOD  Result Value Ref Range Status   Specimen Description BLOOD BLOOD RIGHT ARM  Final   Special Requests   Final    BOTTLES DRAWN AEROBIC AND ANAEROBIC Blood Culture adequate volume   Culture   Final    NO GROWTH 5 DAYS Performed at [facility], [address] [place], [place] [address]    Report Status 09/28/2023 FINAL  Final  Culture, blood (Routine X 2) w Reflex to ID Panel     Status: None   Collection Time: 09/23/23  1:12 AM   Specimen: BLOOD  Result Value Ref Range Status   Specimen Description BLOOD BLOOD RIGHT ARM  Final   Special Requests   Final    BOTTLES DRAWN AEROBIC AND ANAEROBIC Blood Culture adequate volume   Culture   Final    NO GROWTH 5 DAYS Performed at [facility], [address], [place], [place] [address]    Report Status 09/28/2023 FINAL  Final  Urine Culture     Status: Abnormal   Collection Time: 09/23/23  2:43 PM   Specimen: Urine, Random  Result Value Ref Range Status   Specimen Description   Final    URINE, RANDOM Performed at [facility], [address] [place], [place] [address]    Special Requests   Final    NONE Reflexed from [id number] Performed at [facility], [address] [place], [place] [address]    Culture (A)  Final    >=100,000 COLONIES/mL PSEUDOMONAS AERUGINOSA 80,000  COLONIES/mL ESCHERICHIA COLI    Report Status 09/28/2023 FINAL  Final   Organism ID, Bacteria PSEUDOMONAS AERUGINOSA (A)  Final   Organism ID, Bacteria ESCHERICHIA COLI (A)  Final      Susceptibility   Escherichia coli - MIC*    AMPICILLIN <=2 SENSITIVE Sensitive     CEFAZOLIN <=4 SENSITIVE Sensitive     CEFEPIME <=0.12 SENSITIVE Sensitive     CEFTRIAXONE <=0.25 SENSITIVE Sensitive     CIPROFLOXACIN <=0.25 SENSITIVE Sensitive     GENTAMICIN <=1 SENSITIVE Sensitive     IMIPENEM <=0.25 SENSITIVE Sensitive     NITROFURANTOIN <=16 SENSITIVE Sensitive     TRIMETH/SULFA <=20 SENSITIVE Sensitive     AMPICILLIN/SULBACTAM <=2 SENSITIVE Sensitive     PIP/TAZO <=4 SENSITIVE Sensitive ug/mL    * 80,000 COLONIES/mL ESCHERICHIA COLI   Pseudomonas aeruginosa -  MIC*    CEFTAZIDIME 8 SENSITIVE Sensitive     CIPROFLOXACIN <=0.25 SENSITIVE Sensitive     GENTAMICIN <=1 SENSITIVE Sensitive     IMIPENEM 1 SENSITIVE Sensitive     PIP/TAZO 16 SENSITIVE Sensitive ug/mL    CEFEPIME 4 SENSITIVE Sensitive     * >=100,000 COLONIES/mL PSEUDOMONAS AERUGINOSA    Labs: CBC: Recent Labs  Lab 09/23/23 0112 09/27/23 0435  WBC 7.3 7.9  HGB 10.4* 8.5*  HCT 34.1* 29.7*  MCV 87.0 88.7  PLT 286 252   Basic Metabolic Panel: Recent Labs  Lab 09/23/23 0112 09/27/23 0435  NA  --  137  K  --  5.0  CL  --  104  CO2  --  22  GLUCOSE  --  218*  BUN  --  58*  CREATININE 1.41* 1.13  CALCIUM  --  9.6    CBG: Recent Labs  Lab 09/28/23 1126 09/28/23 1619 09/28/23 2125 09/29/23 0335 09/29/23 0754  GLUCAP 248* 225* 258* 221* 246*    Discharge time spent: greater than 30 minutes.  Signed: Vassie Loll, MD [facility] 09/29/2023

## 2023-09-29 NOTE — TOC Transition Note (Signed)
Transition of Care [facility]) - Discharge Note   Patient Details  Name: Jeff Miles MRN: [id number] Date of Birth: [birth year]  [facility] CM/SW Contact:  Karn Cassis, LCSW Phone Number: 09/29/2023, 11:23 AM   Clinical Narrative: [facility] able to offer bed to pt. Pt's wife notified and agreeable. Supervisor to complete LOG. Pt will transport via [facility]- insurance provided EMS auth. RN given number for report. D/C summary sent to SNF.      Final next level of care: Skilled Nursing Facility Barriers to Discharge: Barriers Resolved   Patient Goals and CMS Choice Patient states their goals for this hospitalization and ongoing recovery are:: DC to SNF [facility]          Discharge Placement              Patient chooses bed at: Other - please specify in the comment section below: [facility]) Patient to be transferred to facility by: [facility] Name of family member notified: Wife Patient and family notified of of transfer: 09/29/23  Discharge Plan and Services Additional resources added to the After Visit Summary for   In-house Referral: Clinical Social Work   Post Acute Care Choice: Skilled Nursing Facility                               Social Drivers of Health (SDOH) Interventions SDOH Screenings   Food Insecurity: No Food Insecurity (09/16/2023)  Housing: Low Risk  (09/16/2023)  Transportation Needs: No Transportation Needs (09/16/2023)  Utilities: Not At Risk (09/16/2023)  Social Connections: Patient Unable To Answer (09/16/2023)  Tobacco Use: Medium Risk (09/16/2023)     Readmission Risk Interventions    09/17/2023    4:45 PM  Readmission Risk Prevention Plan  Transportation Screening Complete  PCP or Specialist Appt within 3-5 Days Complete  HRI or Home Care Consult Complete  Social Work Consult for Recovery Care Planning/Counseling Complete  Palliative Care Screening Not Applicable  Medication Review Furniture conservator/restorer) Complete

## 2023-09-30 LAB — CREATININE, SERUM
Creatinine, Ser: 1.05 mg/dL (ref 0.61–1.24)
GFR, Estimated: >60 mL/min (ref >60)

## 2023-09-30 LAB — GLUCOSE, CAPILLARY: Glucose-Capillary: 222 mg/dL — ABNORMAL HIGH (ref 70–99)

## 2023-09-30 NOTE — Progress Notes (Signed)
Chart review; no overnight events.  Patient hemodynamically stable.  Unable to be transferred as previously planned to skilled nursing facility on 09/29/2023 secondary to late availability from transportation unit/truck.  Patient's discharge summary and instructions has been reviewed and no changes needed.  Okay to discharge to facility.   Vassie Loll MD [phone]

## 2023-09-30 NOTE — Plan of Care (Signed)
  Problem: Health Behavior/Discharge Planning: Goal: Ability to manage health-related needs will improve Outcome: Progressing   Problem: Clinical Measurements: Goal: Ability to maintain clinical measurements within normal limits will improve Outcome: Progressing Goal: Will remain free from infection Outcome: Progressing Goal: Diagnostic test results will improve Outcome: Progressing Goal: Respiratory complications will improve Outcome: Progressing Goal: Cardiovascular complication will be avoided Outcome: Progressing   Problem: Activity: Goal: Risk for activity intolerance will decrease Outcome: Progressing   Problem: Nutrition: Goal: Adequate nutrition will be maintained Outcome: Progressing   Problem: Coping: Goal: Level of anxiety will decrease Outcome: Progressing   Problem: Elimination: Goal: Will not experience complications related to bowel motility Outcome: Progressing Goal: Will not experience complications related to urinary retention Outcome: Progressing   Problem: Pain Management: Goal: General experience of comfort will improve Outcome: Progressing   Problem: Safety: Goal: Ability to remain free from injury will improve Outcome: Progressing   Problem: Skin Integrity: Goal: Risk for impaired skin integrity will decrease Outcome: Progressing   Problem: Education: Goal: Ability to describe self-care measures that may prevent or decrease complications (Diabetes Survival Skills Education) will improve Outcome: Progressing Goal: Individualized Educational Video(s) Outcome: Progressing   Problem: Coping: Goal: Ability to adjust to condition or change in health will improve Outcome: Progressing   Problem: Fluid Volume: Goal: Ability to maintain a balanced intake and output will improve Outcome: Progressing   Problem: Health Behavior/Discharge Planning: Goal: Ability to identify and utilize available resources and services will improve Outcome:  Progressing Goal: Ability to manage health-related needs will improve Outcome: Progressing   Problem: Metabolic: Goal: Ability to maintain appropriate glucose levels will improve Outcome: Progressing   Problem: Nutritional: Goal: Maintenance of adequate nutrition will improve Outcome: Progressing Goal: Progress toward achieving an optimal weight will improve Outcome: Progressing   Problem: Skin Integrity: Goal: Risk for impaired skin integrity will decrease Outcome: Progressing   Problem: Tissue Perfusion: Goal: Adequacy of tissue perfusion will improve Outcome: Progressing

## 2023-09-30 NOTE — TOC Progression Note (Signed)
Transition of Care [facility]) - Progression Note    Patient Details  Name: Jeff Miles MRN: [id number] Date of Birth: 1950-11-22  Transition [facility]) CM/SW Contact  Elliot Gault, LCSW Phone Number: 09/30/2023, 9:41 AM  Clinical Narrative:     TOC following. Pt not transported yesterday as EMS not available in time to get pt to the SNF before their 9PM cut off time for admissions.  Pt remains stable for dc per MD. Updated pt's wife who remains in agreement with dc plan.  Updated Jill Side at [facility] that pt put back on the EMS list for this AM. Updated DC clinical sent electronically. RN to call report. No other TOC needs for dc.  Expected Discharge Plan: Skilled Nursing Facility Barriers to Discharge: Barriers Resolved  Expected Discharge Plan and Services In-house Referral: Clinical Social Work   Post Acute Care Choice: Skilled Nursing Facility Living arrangements for the past 2 months: Single Family Home Expected Discharge Date: 09/29/23                                     Social Determinants of Health (SDOH) Interventions SDOH Screenings   Food Insecurity: No Food Insecurity (09/16/2023)  Housing: Low Risk  (09/16/2023)  Transportation Needs: No Transportation Needs (09/16/2023)  Utilities: Not At Risk (09/16/2023)  Social Connections: Patient Unable To Answer (09/16/2023)  Tobacco Use: Medium Risk (09/16/2023)    Readmission Risk Interventions    09/17/2023    4:45 PM  Readmission Risk Prevention Plan  Transportation Screening Complete  PCP or Specialist Appt within 3-5 Days Complete  HRI or Home Care Consult Complete  Social Work Consult for Recovery Care Planning/Counseling Complete  Palliative Care Screening Not Applicable  Medication Review Oceanographer) Complete

## 2023-09-30 NOTE — Progress Notes (Signed)
Spoke with Helmut Muster at [facility] and provided report.

## 2023-10-03 DIAGNOSIS — R5381 Other malaise: Secondary | ICD-10-CM | POA: Diagnosis not present

## 2023-10-03 DIAGNOSIS — G9341 Metabolic encephalopathy: Secondary | ICD-10-CM | POA: Diagnosis not present

## 2023-10-04 DIAGNOSIS — N179 Acute kidney failure, unspecified: Secondary | ICD-10-CM | POA: Diagnosis not present

## 2023-10-04 DIAGNOSIS — F1026 Alcohol dependence with alcohol-induced persisting amnestic disorder: Secondary | ICD-10-CM | POA: Diagnosis not present

## 2023-10-04 DIAGNOSIS — Z9981 Dependence on supplemental oxygen: Secondary | ICD-10-CM | POA: Diagnosis not present

## 2023-10-04 DIAGNOSIS — R531 Weakness: Secondary | ICD-10-CM | POA: Diagnosis not present

## 2023-10-04 DIAGNOSIS — R296 Repeated falls: Secondary | ICD-10-CM | POA: Diagnosis not present

## 2023-10-04 DIAGNOSIS — I700 Atherosclerosis of aorta: Secondary | ICD-10-CM | POA: Diagnosis not present

## 2023-10-04 DIAGNOSIS — J439 Emphysema, unspecified: Secondary | ICD-10-CM | POA: Diagnosis not present

## 2023-10-04 DIAGNOSIS — I5032 Chronic diastolic (congestive) heart failure: Secondary | ICD-10-CM | POA: Diagnosis not present

## 2023-10-04 DIAGNOSIS — N19 Unspecified kidney failure: Secondary | ICD-10-CM | POA: Diagnosis not present

## 2023-10-04 DIAGNOSIS — I10 Essential (primary) hypertension: Secondary | ICD-10-CM | POA: Diagnosis not present

## 2023-10-04 DIAGNOSIS — J9611 Chronic respiratory failure with hypoxia: Secondary | ICD-10-CM | POA: Diagnosis not present

## 2023-10-04 DIAGNOSIS — F411 Generalized anxiety disorder: Secondary | ICD-10-CM | POA: Diagnosis not present

## 2023-10-04 DIAGNOSIS — N401 Enlarged prostate with lower urinary tract symptoms: Secondary | ICD-10-CM | POA: Diagnosis not present

## 2023-10-04 DIAGNOSIS — K219 Gastro-esophageal reflux disease without esophagitis: Secondary | ICD-10-CM | POA: Diagnosis not present

## 2023-10-04 DIAGNOSIS — J449 Chronic obstructive pulmonary disease, unspecified: Secondary | ICD-10-CM | POA: Diagnosis not present

## 2023-10-04 DIAGNOSIS — R627 Adult failure to thrive: Secondary | ICD-10-CM | POA: Diagnosis not present

## 2023-10-04 DIAGNOSIS — I959 Hypotension, unspecified: Secondary | ICD-10-CM | POA: Diagnosis not present

## 2023-10-04 DIAGNOSIS — R0689 Other abnormalities of breathing: Secondary | ICD-10-CM | POA: Diagnosis not present

## 2023-10-04 DIAGNOSIS — R4182 Altered mental status, unspecified: Secondary | ICD-10-CM | POA: Diagnosis not present

## 2023-10-04 DIAGNOSIS — E875 Hyperkalemia: Secondary | ICD-10-CM | POA: Diagnosis not present

## 2023-10-05 ENCOUNTER — Ambulatory Visit: Payer: Medicare HMO | Admitting: Urology

## 2023-10-05 DIAGNOSIS — D500 Iron deficiency anemia secondary to blood loss (chronic): Secondary | ICD-10-CM | POA: Diagnosis not present

## 2023-10-05 DIAGNOSIS — A419 Sepsis, unspecified organism: Secondary | ICD-10-CM | POA: Diagnosis not present

## 2023-10-05 DIAGNOSIS — R4182 Altered mental status, unspecified: Secondary | ICD-10-CM | POA: Diagnosis not present

## 2023-10-05 DIAGNOSIS — E875 Hyperkalemia: Secondary | ICD-10-CM | POA: Diagnosis not present

## 2023-10-05 DIAGNOSIS — E512 Wernicke's encephalopathy: Secondary | ICD-10-CM | POA: Diagnosis not present

## 2023-10-05 DIAGNOSIS — F0150 Vascular dementia without behavioral disturbance: Secondary | ICD-10-CM | POA: Diagnosis not present

## 2023-10-05 DIAGNOSIS — I110 Hypertensive heart disease with heart failure: Secondary | ICD-10-CM | POA: Diagnosis not present

## 2023-10-05 DIAGNOSIS — N400 Enlarged prostate without lower urinary tract symptoms: Secondary | ICD-10-CM | POA: Diagnosis not present

## 2023-10-05 DIAGNOSIS — N179 Acute kidney failure, unspecified: Secondary | ICD-10-CM | POA: Diagnosis not present

## 2023-10-05 DIAGNOSIS — E870 Hyperosmolality and hypernatremia: Secondary | ICD-10-CM | POA: Diagnosis not present

## 2023-10-05 DIAGNOSIS — R627 Adult failure to thrive: Secondary | ICD-10-CM | POA: Diagnosis not present

## 2023-10-05 DIAGNOSIS — Z515 Encounter for palliative care: Secondary | ICD-10-CM | POA: Diagnosis not present

## 2023-10-05 DIAGNOSIS — I5032 Chronic diastolic (congestive) heart failure: Secondary | ICD-10-CM | POA: Diagnosis not present

## 2023-10-05 DIAGNOSIS — Z9981 Dependence on supplemental oxygen: Secondary | ICD-10-CM | POA: Diagnosis not present

## 2023-10-05 DIAGNOSIS — R6520 Severe sepsis without septic shock: Secondary | ICD-10-CM | POA: Diagnosis not present

## 2023-10-05 DIAGNOSIS — J9611 Chronic respiratory failure with hypoxia: Secondary | ICD-10-CM | POA: Diagnosis not present

## 2023-10-05 DIAGNOSIS — R296 Repeated falls: Secondary | ICD-10-CM | POA: Diagnosis not present

## 2023-10-05 DIAGNOSIS — Z66 Do not resuscitate: Secondary | ICD-10-CM | POA: Diagnosis not present

## 2023-10-05 DIAGNOSIS — N19 Unspecified kidney failure: Secondary | ICD-10-CM | POA: Diagnosis not present

## 2023-10-05 DIAGNOSIS — J449 Chronic obstructive pulmonary disease, unspecified: Secondary | ICD-10-CM | POA: Diagnosis not present

## 2023-10-05 DIAGNOSIS — G9341 Metabolic encephalopathy: Secondary | ICD-10-CM | POA: Diagnosis not present

## 2023-10-15 DEATH — deceased
